# Patient Record
Sex: Male | Born: 1940 | Race: White | Hispanic: No | Marital: Married | Smoking: Former smoker
Health system: Southern US, Community
[De-identification: ages and names within clinical notes are randomized; demographics above are authoritative.]

## PROBLEM LIST (undated history)

## (undated) DIAGNOSIS — E785 Hyperlipidemia, unspecified: Secondary | ICD-10-CM

## (undated) DIAGNOSIS — Z8679 Personal history of other diseases of the circulatory system: Secondary | ICD-10-CM

## (undated) DIAGNOSIS — I5020 Unspecified systolic (congestive) heart failure: Secondary | ICD-10-CM

## (undated) DIAGNOSIS — I2510 Atherosclerotic heart disease of native coronary artery without angina pectoris: Secondary | ICD-10-CM

## (undated) DIAGNOSIS — I10 Essential (primary) hypertension: Secondary | ICD-10-CM

## (undated) DIAGNOSIS — J439 Emphysema, unspecified: Secondary | ICD-10-CM

## (undated) DIAGNOSIS — C801 Malignant (primary) neoplasm, unspecified: Secondary | ICD-10-CM

## (undated) HISTORY — PX: TONSILLECTOMY: SUR1361

## (undated) HISTORY — PX: KNEE ARTHROSCOPY: SHX127

## (undated) HISTORY — PX: ORIF FEMORAL NECK FRACTURE W/ DHS: SUR930

## (undated) HISTORY — DX: Hyperlipidemia, unspecified: E78.5

## (undated) HISTORY — DX: Emphysema, unspecified: J43.9

## (undated) HISTORY — DX: Personal history of other diseases of the circulatory system: Z86.79

## (undated) HISTORY — DX: Atherosclerotic heart disease of native coronary artery without angina pectoris: I25.10

## (undated) HISTORY — PX: CORONARY STENT PLACEMENT: SHX1402

---

## 1998-10-06 ENCOUNTER — Ambulatory Visit (HOSPITAL_COMMUNITY): Admission: RE | Admit: 1998-10-06 | Discharge: 1998-10-06 | Payer: Self-pay | Admitting: Internal Medicine

## 2000-03-28 ENCOUNTER — Encounter: Admission: RE | Admit: 2000-03-28 | Discharge: 2000-03-28 | Payer: Self-pay | Admitting: Internal Medicine

## 2000-03-28 ENCOUNTER — Encounter: Payer: Self-pay | Admitting: Internal Medicine

## 2000-04-02 ENCOUNTER — Ambulatory Visit (HOSPITAL_COMMUNITY): Admission: RE | Admit: 2000-04-02 | Discharge: 2000-04-02 | Payer: Self-pay | Admitting: Internal Medicine

## 2000-04-02 ENCOUNTER — Encounter: Payer: Self-pay | Admitting: Internal Medicine

## 2001-01-23 ENCOUNTER — Other Ambulatory Visit: Admission: RE | Admit: 2001-01-23 | Discharge: 2001-01-23 | Payer: Self-pay | Admitting: Internal Medicine

## 2001-03-21 ENCOUNTER — Encounter: Admission: RE | Admit: 2001-03-21 | Discharge: 2001-03-21 | Payer: Self-pay | Admitting: Orthopedic Surgery

## 2001-03-21 ENCOUNTER — Encounter: Payer: Self-pay | Admitting: Orthopedic Surgery

## 2004-02-09 ENCOUNTER — Emergency Department (HOSPITAL_COMMUNITY): Admission: EM | Admit: 2004-02-09 | Discharge: 2004-02-09 | Payer: Self-pay | Admitting: Family Medicine

## 2006-07-14 ENCOUNTER — Emergency Department (HOSPITAL_COMMUNITY): Admission: EM | Admit: 2006-07-14 | Discharge: 2006-07-14 | Payer: Self-pay | Admitting: Family Medicine

## 2006-07-21 ENCOUNTER — Emergency Department (HOSPITAL_COMMUNITY): Admission: EM | Admit: 2006-07-21 | Discharge: 2006-07-21 | Payer: Self-pay | Admitting: Emergency Medicine

## 2006-08-29 ENCOUNTER — Ambulatory Visit (HOSPITAL_COMMUNITY): Admission: RE | Admit: 2006-08-29 | Discharge: 2006-08-29 | Payer: Self-pay | Admitting: Gastroenterology

## 2008-07-31 ENCOUNTER — Emergency Department (HOSPITAL_COMMUNITY): Admission: EM | Admit: 2008-07-31 | Discharge: 2008-07-31 | Payer: Self-pay | Admitting: Family Medicine

## 2009-06-11 ENCOUNTER — Encounter: Payer: Self-pay | Admitting: Pulmonary Disease

## 2009-06-29 ENCOUNTER — Inpatient Hospital Stay (HOSPITAL_COMMUNITY): Admission: AD | Admit: 2009-06-29 | Discharge: 2009-06-30 | Payer: Self-pay | Admitting: Cardiology

## 2009-07-06 ENCOUNTER — Inpatient Hospital Stay (HOSPITAL_COMMUNITY): Admission: AD | Admit: 2009-07-06 | Discharge: 2009-07-07 | Payer: Self-pay | Admitting: Interventional Cardiology

## 2009-12-03 ENCOUNTER — Emergency Department (HOSPITAL_COMMUNITY): Admission: EM | Admit: 2009-12-03 | Discharge: 2009-12-03 | Payer: Self-pay | Admitting: Family Medicine

## 2010-02-22 ENCOUNTER — Emergency Department (HOSPITAL_COMMUNITY): Admission: EM | Admit: 2010-02-22 | Discharge: 2010-02-22 | Payer: Self-pay | Admitting: Family Medicine

## 2010-03-07 ENCOUNTER — Ambulatory Visit: Payer: Self-pay | Admitting: Family Medicine

## 2010-03-07 DIAGNOSIS — S7000XA Contusion of unspecified hip, initial encounter: Secondary | ICD-10-CM | POA: Insufficient documentation

## 2010-03-07 DIAGNOSIS — F411 Generalized anxiety disorder: Secondary | ICD-10-CM | POA: Insufficient documentation

## 2010-03-07 DIAGNOSIS — E785 Hyperlipidemia, unspecified: Secondary | ICD-10-CM

## 2010-03-07 DIAGNOSIS — I10 Essential (primary) hypertension: Secondary | ICD-10-CM | POA: Insufficient documentation

## 2010-04-05 ENCOUNTER — Encounter: Payer: Self-pay | Admitting: Pulmonary Disease

## 2010-04-05 ENCOUNTER — Ambulatory Visit (HOSPITAL_COMMUNITY): Admission: RE | Admit: 2010-04-05 | Discharge: 2010-04-05 | Payer: Self-pay | Admitting: Cardiology

## 2010-04-16 ENCOUNTER — Encounter (HOSPITAL_COMMUNITY): Admission: RE | Admit: 2010-04-16 | Discharge: 2010-06-08 | Payer: Self-pay | Admitting: Cardiology

## 2010-08-05 ENCOUNTER — Ambulatory Visit (HOSPITAL_COMMUNITY): Admission: RE | Admit: 2010-08-05 | Discharge: 2010-08-05 | Payer: Self-pay | Admitting: Gastroenterology

## 2010-08-14 ENCOUNTER — Encounter: Payer: Self-pay | Admitting: Pulmonary Disease

## 2010-12-14 ENCOUNTER — Encounter: Payer: Self-pay | Admitting: Pulmonary Disease

## 2010-12-16 ENCOUNTER — Ambulatory Visit (HOSPITAL_COMMUNITY)
Admission: RE | Admit: 2010-12-16 | Discharge: 2010-12-16 | Payer: Self-pay | Source: Home / Self Care | Attending: Internal Medicine | Admitting: Internal Medicine

## 2010-12-16 ENCOUNTER — Encounter: Payer: Self-pay | Admitting: Pulmonary Disease

## 2010-12-22 ENCOUNTER — Encounter (HOSPITAL_COMMUNITY)
Admission: RE | Admit: 2010-12-22 | Discharge: 2011-01-04 | Payer: Self-pay | Source: Home / Self Care | Attending: Family Medicine | Admitting: Family Medicine

## 2010-12-22 ENCOUNTER — Other Ambulatory Visit: Payer: Self-pay | Admitting: Family Medicine

## 2010-12-23 ENCOUNTER — Ambulatory Visit
Admission: RE | Admit: 2010-12-23 | Discharge: 2010-12-23 | Payer: Self-pay | Source: Home / Self Care | Attending: Pulmonary Disease | Admitting: Pulmonary Disease

## 2010-12-23 DIAGNOSIS — R519 Headache, unspecified: Secondary | ICD-10-CM | POA: Insufficient documentation

## 2010-12-23 DIAGNOSIS — J439 Emphysema, unspecified: Secondary | ICD-10-CM

## 2010-12-23 DIAGNOSIS — R7611 Nonspecific reaction to tuberculin skin test without active tuberculosis: Secondary | ICD-10-CM | POA: Insufficient documentation

## 2010-12-23 DIAGNOSIS — R222 Localized swelling, mass and lump, trunk: Secondary | ICD-10-CM | POA: Insufficient documentation

## 2010-12-23 DIAGNOSIS — F17200 Nicotine dependence, unspecified, uncomplicated: Secondary | ICD-10-CM | POA: Insufficient documentation

## 2010-12-23 DIAGNOSIS — R51 Headache: Secondary | ICD-10-CM | POA: Insufficient documentation

## 2010-12-23 HISTORY — DX: Emphysema, unspecified: J43.9

## 2010-12-24 ENCOUNTER — Other Ambulatory Visit
Admission: RE | Admit: 2010-12-24 | Discharge: 2010-12-24 | Payer: Self-pay | Source: Home / Self Care | Admitting: Internal Medicine

## 2010-12-27 ENCOUNTER — Other Ambulatory Visit: Payer: Self-pay | Admitting: Pulmonary Disease

## 2010-12-27 ENCOUNTER — Telehealth: Payer: Self-pay | Admitting: Pulmonary Disease

## 2010-12-27 DIAGNOSIS — R918 Other nonspecific abnormal finding of lung field: Secondary | ICD-10-CM

## 2010-12-28 LAB — CULTURE, RESPIRATORY W GRAM STAIN: Culture: NORMAL

## 2011-01-05 ENCOUNTER — Encounter: Payer: Self-pay | Admitting: Pulmonary Disease

## 2011-01-05 ENCOUNTER — Other Ambulatory Visit: Payer: Self-pay | Admitting: Pulmonary Disease

## 2011-01-05 DIAGNOSIS — R51 Headache: Secondary | ICD-10-CM

## 2011-01-06 NOTE — Progress Notes (Signed)
Summary: Proair too expensive  Phone Note Call from Patient   Caller: Todd Villanueva Call For: Todd Villanueva Summary of Call: the inhalers dr Craige Cotta gave to him are very expensive can he use something else [phone] Initial call taken by: Oneita Jolly,  December 27, 2010 10:52 AM  Follow-up for Phone Call        Spoke with pt and  he states that he has insurance with a  high deductible so he is having to pay cash for proair and it si too expensive. he states he will talk to Dr. 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 i advised I could sent message to Dr. Craige Cotta, and he states he will just talk to him at work. [name] CMA  December 27, 2010 2:45 PM

## 2011-01-06 NOTE — Assessment & Plan Note (Signed)
Summary: R Hip pain x 2 dys rm 3   Vital Signs:  Patient Profile:   70 Years Old Male CC:      R Hip pain x 2 dys Height:     67 inches Weight:      174 pounds O2 Sat:      98 % O2 treatment:    Room Air Temp:     97.5 degrees F oral Pulse rate:   71 / minute Pulse rhythm:   regular Resp:     16 per minute BP sitting:   151 / 75  (right arm) Cuff size:   regular  Vitals Entered By: Areta Haber CMA (March 07, 2010 2:20 PM)                  Current Allergies (reviewed today): ! VICODIN      History of Present Illness Chief Complaint: R Hip pain x 2 dys History of Present Illness: Subjective:  Patient complains of falling on his right hip 2 days ago, and has had persistent pain in that area with ambulation.  He states that he had a subtle fracture in the right hip about 9 years ago that was not obvious on the initial x-ray.  Current Problems: CONTUSION, HIP, RIGHT (ICD-924.01) HYPERTENSION (ICD-401.9) HYPERLIPIDEMIA (ICD-272.4) ANXIETY (ICD-300.00)   Current Meds SIMVASTATIN 20 MG TABS (SIMVASTATIN) 1 tab by mouth once daily PROTONIX 40 MG TBEC (PANTOPRAZOLE SODIUM) 2 tabs by mouth once daily PLAVIX 75 MG TABS (CLOPIDOGREL BISULFATE) 1 tab by mouth once daily LISINOPRIL 40 MG TABS (LISINOPRIL) 1 tab by mouth once daily CITALOPRAM HYDROBROMIDE 40 MG TABS (CITALOPRAM HYDROBROMIDE) 1/2 tab by mouth once daily IBUPROFEN 800 MG TABS (IBUPROFEN) as needed. Wife's medication IBUPROFEN 800 MG TABS (IBUPROFEN) One by mouth every 8 hours with food  REVIEW OF SYSTEMS Constitutional Symptoms      Denies fever, chills, night sweats, weight loss, weight gain, and fatigue.  Eyes       Denies change in vision, eye pain, eye discharge, glasses, contact lenses, and eye surgery. Ear/Nose/Throat/Mouth       Denies hearing loss/aids, change in hearing, ear pain, ear discharge, dizziness, frequent runny nose, frequent nose bleeds, sinus problems, sore throat, hoarseness, and  tooth pain or bleeding.  Respiratory       Denies dry cough, productive cough, wheezing, shortness of breath, asthma, bronchitis, and emphysema/COPD.  Cardiovascular       Denies murmurs, chest pain, and tires easily with exhertion.    Gastrointestinal       Denies stomach pain, nausea/vomiting, diarrhea, constipation, blood in bowel movements, and indigestion. Genitourniary       Denies painful urination, kidney stones, and loss of urinary control. Neurological       Denies paralysis, seizures, and fainting/blackouts. Musculoskeletal       Complains of decreased range of motion.      Denies muscle pain, joint pain, joint stiffness, redness, swelling, muscle weakness, and gout.      Comments: R Hip pain x 2 dys Skin       Denies bruising, unusual mles/lumps or sores, and hair/skin or nail changes.  Psych       Denies mood changes, temper/anger issues, anxiety/stress, speech problems, depression, and sleep problems. Other Comments: Pt states that his dogs's chain got wrapped around his leg and he fell on right side. Pt has not seen PCP for this.   Past History:  Past Medical History: Anxiety Hyperlipidemia Hypertension Acid Reflux  Past Surgical History: Heart Stents  Family History: Family History Other cancer  Social History: Married Current Smoker - 1/2 pack daily Alcohol use-yes 2 drinks weekly Drug use-no Regular exercise-yes Smoking Status:  current Drug Use:  no Does Patient Exercise:  yes   Objective:  No acute distress.  Patient walking with a cane Lungs:  Clear to auscultation.  Breath sounds are equal.  Heart:  Regular rate and rhythm without murmurs, rubs, or gallops.  Abdomen:  Nontender without masses or hepatosplenomegaly.  Bowel sounds are present.  No CVA or flank tenderness.  Right hip:  Good range of motion but has pain with external rotation.  Tenderness superior to the greater trochanter. X-ray right hip negative Assessment New  Problems: CONTUSION, HIP, RIGHT (ICD-924.01) HYPERTENSION (ICD-401.9) HYPERLIPIDEMIA (ICD-272.4) ANXIETY (ICD-300.00)   Plan New Medications/Changes: IBUPROFEN 800 MG TABS (IBUPROFEN) One by mouth every 8 hours with food  #30 x 0, 03/07/2010, Donna Christen MD  New Orders: T-DG Hip Complete*R* [16109] New Patient Level III [60454] Planning Comments:   Apply ice pack for 2 to 3 days.  Use crutches for about 5 days.  May take wife's 800mg  Ibuprofen.  Begin hip exercises ([facility] information and instruction patient handout given)  Follow-up with orthopedist if not improving 2 weeks.   The patient and/or caregiver has been counseled thoroughly with regard to medications prescribed including dosage, schedule, interactions, rationale for use, and possible side effects and they verbalize understanding.  Diagnoses and expected course of recovery discussed and will return if not improved as expected or if the condition worsens. Patient and/or caregiver verbalized understanding.  Prescriptions: IBUPROFEN 800 MG TABS (IBUPROFEN) One by mouth every 8 hours with food  #30 x 0   Entered and Authorized by:   Donna Christen MD   Signed by:   Donna Christen MD on 03/07/2010   Method used:   Print then Give to Patient   RxID:   [id number]

## 2011-01-06 NOTE — Assessment & Plan Note (Signed)
Summary: ABNORMAL CHEST CT POSTIVIE PPD//SH   Primary Provider/Referring Provider:  Marden Noble  CC:  Pulmonary Consult, abnormal CT , positive ppd 1990, pt c/o headaches, and sob mild with exertion.  History of Present Illness: 70 yo male with hx of positive PPD in 1990, tobacco abuse, emphysema, chronic cough, and right lung mass.  He has chronic cough for the past 3 months.  He was seen by primary care on Dec 14, 2010.  He had chest xray then which showed right lower lung lesion.  He was given zithromax, and had CT chest ordered.  After CT chest was done pulmonary consultation was requested.  He had positive PPD in 1990.  He was never treated for active TB.  His cough has been dry.  He denies fever, sweats, or hemoptysis.  His weight has been steady, and he denies chest pain.  He does get wheezing, and will occasionally get short of breath.  He was told he has asthmatic bronchitis when he gets a cold.  He was recently given prednisone for his cough, and this seemed to help.  He has not been on inhalers for his breathing before.  He does not recall having breathing tests before.    He has smoked upto 1 pack of cigarettes per day.  He has reduced to 1/2 pack per day.  He denies any recent sick exposures.  He has a Emergency planning/management officer and dog, but no other animal exposures.  He has never had pneumonia before.  He works as the Production assistant, radio at [employer].  He denies any travel history.  He has been getting a constant frontal headache for the past 3 weeks.  He did not have problems with headache before.  He denies change in vision, difficulty swallowing, or hoarseness.  He has not noticed gland swelling, leg swelling, or skin rashes.  He had sputum samples for AFB and cytology sent which are negative to date.  Chest xray report from June 10, 2009: mild peribronchial thickening and mild hyperinflation of the lungs. Labs from December 14, 2010 were reviewed, and unremarkable.  CT of Chest  Procedure  date:  12/16/2010  Findings:      CT CHEST WITH CONTRAST    Technique:  Multidetector CT imaging of the chest was performed   following the standard protocol during bolus administration of   intravenous contrast.    Contrast: 100  ml Omnipaque-300    Comparison: None.  No outside report or plain film is available for   comparison.    Findings: Lung windows demonstrate moderate centrilobular   emphysema.  Concurrent paraseptal component.  7 mm left lower lobe lung nodule on image 35 may represent a subpleural lymph node.    A partially cavitary right lower lobe lung mass measures 5.6 x 4.8   cm on transverse image 42 and 5.6 cm on sagittal reformatted image 21.  There is mild surrounding interstitial opacity.    Soft tissue windows demonstrate moderate great vessel   atherosclerosis.  Borderline cardiomegaly with coronary artery   atherosclerosis.  No pericardial or pleural effusion.  The right   lower lobe mass contacts the right pleural surface but there is no   chest wall invasion or rib destruction.    9 mm high and low right paratracheal lymph nodes are indeterminate.    Extensive right infrahilar adenopathy.  Index nodal mass measures   2.5 x 1.8 cm on image 31 and displaces the patent pulmonary artery branch to  the right lower lobe.  Azygo-esophageal recess node measures 8 mm.    Limited abdominal imaging demonstrates normal adrenal glands.   Upper pole right renal cyst.  Mild nonspecific perirenal edema is   symmetric.No acute osseous abnormality.  Comments:       IMPRESSION:    1.  Partially cavitary right lower lobe lung mass.  Highly   suspicious for a primary bronchogenic carcinoma.  Given the   surrounding interstitial opacity, infection is possible but felt   less likely.  Consider thoracic surgery consultation.  If there are   acute infectious symptoms, antibiotics could be given and follow-up CT performed in approximately 2 weeks.   2.  Right hilar/infrahilar  adenopathy, presumably related to nodal   metastasis.  More equivocal right-sided mediastinal lymph nodes.   3.  Coronary artery atherosclerosis.   Preventive Screening-Counseling & Management  Alcohol-Tobacco     Smoking Status: current     Packs/Day: 0.5  Current Medications (verified): 1)  Simvastatin 40 Mg Tabs (Simvastatin) .Marland Kitchen.. 1 Three Times A Week , M ,w, F 2)  Lisinopril 40 Mg Tabs (Lisinopril) .Marland Kitchen.. 1 Tab By Mouth Once Daily 3)  Citalopram Hydrobromide 40 Mg Tabs (Citalopram Hydrobromide) .... 1/2 Tab By Mouth Once Daily 4)  Omeprazole 40 Mg Cpdr (Omeprazole) .Marland Kitchen.. 1 Once Daily 5)  Aspirin 81 Mg Tabs (Aspirin) .Marland Kitchen.. 1 Once Daily  Allergies (verified): 1)  ! 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 D deficiency B12 deficiency Iron deficiency anemia  Past Surgical History: Coronary stenting July, August 2011 Left knee arthroscopy Tonsillectomy Right femoral neck fracture Right 9th rib fracture Colonoscopy 2011  Family History: Reviewed history from 03/07/2010 and no changes required. Father - Multiple myeloma Brother - Multiple myeloma Sister - emphysema  Social History: Reviewed history from 03/07/2010 and no changes required. Married, has children.  Smokes 1/2 pack per day.  Occasional alcohol use.  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  Packs/Day:  0.5  Review of Systems       The patient complains of shortness of breath with activity, productive cough, and headaches.  The patient denies shortness of breath at rest, non-productive cough, coughing up blood, chest pain, irregular heartbeats, acid heartburn, indigestion, loss of appetite, weight change, abdominal pain, difficulty swallowing, sore throat, tooth/dental problems, nasal congestion/difficulty  breathing through nose, sneezing, itching, ear ache, anxiety, depression, hand/feet swelling, joint stiffness or pain, rash, change in color of mucus, and fever.    Vital Signs:  Patient profile:   70 year old male Height:      67 inches Weight:      168 pounds BMI:     26.41 O2 Sat:      99 % on Room air Temp:     97.3 degrees F oral Pulse rate:   71 / minute BP sitting:   140 / 80  (left arm)  Vitals Entered By: Renold Genta RCP, LPN (December 23, 2010 3:19 PM)  O2 Flow:  Room air CC: Pulmonary Consult, abnormal CT , positive ppd 1990, pt c/o headaches, sob mild with exertion Comments Medications reviewed with patient Renold Genta RCP, LPN  December 23, 2010 3:24 PM    Physical Exam  General:  normal appearance and healthy appearing.   Eyes:  PERRLA and EOMI, wears glasses Nose:  no deformity, discharge, inflammation, or lesions Mouth:  wears dentures, no exudate Neck:  no JVD.   Chest Wall:  no deformities noted Lungs:  coarse breath sounds, no wheezing or rales Heart:  regular rate and rhythm, S1, S2 without murmurs, rubs, gallops, or clicks Abdomen:  bowel sounds positive; abdomen soft and non-tender without masses, or organomegaly Msk:  no deformity or scoliosis noted with normal posture Pulses:  pulses normal Extremities:  no clubbing, cyanosis, edema, or deformity noted Neurologic:  normal CN II-XII and strength normal.   Skin:  intact without lesions or rashes Cervical Nodes:  no significant adenopathy Psych:  anxious.     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  His radiographic appearance is concerning for malignancy.    He has a prior history of positive PPD.  However, his symptoms are not suggestive of active TB.  In addition he has negative AFB smears.  My clinical suspicion for TB is very low.  I have recommended further evaluation for malignancy with PET scan imaging of his chest.   He will then likely need biopsy of his lesion, and best location will be determined after review of his PET scan.  Problem # 2:  HEADACHE (ICD-784.0) He has recent onset of headache.  Given concern for lung malignancy, will order MRI of brain with contrast to further assess for metastatic brain lesions.  Problem # 3:  EMPHYSEMA (ICD-492.8) He has extensive history of tobacco abuse, and radiographic findings of emphysema.  His symptoms are suggestive of chronic bronchitis/obstructive lung disease.  Will give him a therapuetic trial of proair.  Depending on his response he may need further assessment with pulmonary function testing, especially if he needs to have surgical evaluation for his right lung lesion.  Problem # 4:  TOBACCO ABUSE (ICD-305.1) Explained to him how continued tobacco abuse is affecting his health.    Problem # 5:  NONSPEC REACT TUBERCULIN SKIN TEST W/O ACTIVE TB (ICD-795.51) As stated above clinical suspicion for active TB as cause of his radiographic findings is low.  Medications Added to Medication List This Visit: 1)  Proair Hfa 108 (90 Base) Mcg/act Aers (Albuterol sulfate) .... Two puffs up to four times per day as needed 2)  Simvastatin 40 Mg Tabs (Simvastatin) .Marland Kitchen.. 1 three times a week , m ,w, f 3)  Aspirin 81 Mg Tabs (Aspirin) .Marland Kitchen.. 1 once daily 4)  Omeprazole 40 Mg Cpdr (Omeprazole) .Marland Kitchen.. 1 once daily  Complete Medication List: 1)  Proair Hfa 108 (90 Base) Mcg/act Aers (Albuterol sulfate) .... Two puffs up to four times per day as needed 2)  Simvastatin 40 Mg Tabs (Simvastatin) .Marland Kitchen.. 1 three times a week , m ,w, f 3)  Lisinopril 40 Mg Tabs (Lisinopril) .Marland Kitchen.. 1 tab by mouth once daily 4)  Aspirin 81 Mg Tabs (Aspirin) .Marland Kitchen.. 1 once daily 5)  Citalopram Hydrobromide 40 Mg Tabs (Citalopram hydrobromide) .... 1/2 tab by mouth once daily 6)  Omeprazole 40 Mg Cpdr (Omeprazole) .Marland Kitchen.. 1 once daily  Other Orders: Consultation Level IV (16109) Radiology Referral  (Radiology) Radiology Referral (Radiology)  Patient Instructions: 1)  Will schedule PET scan and MRI 2)  Proair two puffs up to four times per day as needed for cough, wheeze, chest congestion, or shortness of breath 3)  Follow up in 2 to 3 weeks Prescriptions: PROAIR HFA 108 (90 BASE) MCG/ACT AERS (ALBUTEROL SULFATE) two puffs up to four times  per day as needed  #1 x 3   Entered and Authorized by:   Coralyn Helling MD   Signed by:   Coralyn Helling MD on 12/23/2010   Method used:   Electronically to        [facility] [phone]* (retail)       [address] [place], [place]  [address]       Ph: [phone] or [phone]       Fax: [phone]   RxID:   [id number]

## 2011-01-12 ENCOUNTER — Ambulatory Visit (HOSPITAL_COMMUNITY)
Admission: RE | Admit: 2011-01-12 | Discharge: 2011-01-12 | Disposition: A | Discharge status: Home / Self Care | Payer: 59 | Source: Ambulatory Visit | Attending: Pulmonary Disease | Admitting: Pulmonary Disease

## 2011-01-12 ENCOUNTER — Encounter (HOSPITAL_COMMUNITY): Payer: Self-pay

## 2011-01-12 DIAGNOSIS — C3490 Malignant neoplasm of unspecified part of unspecified bronchus or lung: Secondary | ICD-10-CM | POA: Insufficient documentation

## 2011-01-12 DIAGNOSIS — R918 Other nonspecific abnormal finding of lung field: Secondary | ICD-10-CM

## 2011-01-12 DIAGNOSIS — C771 Secondary and unspecified malignant neoplasm of intrathoracic lymph nodes: Secondary | ICD-10-CM | POA: Insufficient documentation

## 2011-01-12 HISTORY — DX: Essential (primary) hypertension: I10

## 2011-01-12 MED ORDER — FLUDEOXYGLUCOSE F - 18 (FDG) INJECTION: 18.3000 | Freq: Once | INTRAVENOUS | Status: AC | PRN

## 2011-01-13 ENCOUNTER — Ambulatory Visit (HOSPITAL_COMMUNITY)
Admission: RE | Admit: 2011-01-13 | Discharge: 2011-01-13 | Disposition: A | Discharge status: Home / Self Care | Payer: 59 | Source: Ambulatory Visit | Attending: Pulmonary Disease | Admitting: Pulmonary Disease

## 2011-01-13 ENCOUNTER — Telehealth (INDEPENDENT_AMBULATORY_CARE_PROVIDER_SITE_OTHER): Payer: Self-pay | Admitting: *Deleted

## 2011-01-13 DIAGNOSIS — G319 Degenerative disease of nervous system, unspecified: Secondary | ICD-10-CM | POA: Insufficient documentation

## 2011-01-13 DIAGNOSIS — I679 Cerebrovascular disease, unspecified: Secondary | ICD-10-CM | POA: Insufficient documentation

## 2011-01-13 DIAGNOSIS — G9389 Other specified disorders of brain: Secondary | ICD-10-CM | POA: Insufficient documentation

## 2011-01-13 DIAGNOSIS — R51 Headache: Secondary | ICD-10-CM | POA: Insufficient documentation

## 2011-01-13 DIAGNOSIS — J3489 Other specified disorders of nose and nasal sinuses: Secondary | ICD-10-CM | POA: Insufficient documentation

## 2011-01-13 LAB — CREATININE, SERUM: Creatinine, Ser: 1.01 mg/dL (ref 0.4–1.5)

## 2011-01-13 MED ORDER — GADOBENATE DIMEGLUMINE 529 MG/ML IV SOLN
15.0000 mL | Freq: Once | INTRAVENOUS | Status: AC
  Administered 2011-01-13: 15 mL via INTRAVENOUS

## 2011-01-14 ENCOUNTER — Other Ambulatory Visit: Payer: Self-pay | Admitting: Pulmonary Disease

## 2011-01-14 ENCOUNTER — Telehealth: Payer: Self-pay | Admitting: Pulmonary Disease

## 2011-01-14 ENCOUNTER — Encounter: Payer: Self-pay | Admitting: Pulmonary Disease

## 2011-01-14 ENCOUNTER — Other Ambulatory Visit (HOSPITAL_COMMUNITY): Payer: Commercial Managed Care - PPO

## 2011-01-14 DIAGNOSIS — R918 Other nonspecific abnormal finding of lung field: Secondary | ICD-10-CM

## 2011-01-19 ENCOUNTER — Ambulatory Visit (HOSPITAL_COMMUNITY)
Admission: RE | Admit: 2011-01-19 | Discharge: 2011-01-19 | Disposition: A | Discharge status: Home / Self Care | Payer: 59 | Source: Ambulatory Visit | Attending: Interventional Radiology | Admitting: Interventional Radiology

## 2011-01-19 ENCOUNTER — Other Ambulatory Visit: Payer: Self-pay | Admitting: Pulmonary Disease

## 2011-01-19 ENCOUNTER — Encounter (HOSPITAL_COMMUNITY)
Admission: RE | Admit: 2011-01-19 | Discharge: 2011-01-19 | Disposition: A | Discharge status: Home / Self Care | Payer: 59 | Source: Ambulatory Visit | Attending: Pulmonary Disease | Admitting: Pulmonary Disease

## 2011-01-19 ENCOUNTER — Other Ambulatory Visit (HOSPITAL_COMMUNITY): Payer: Self-pay | Admitting: Interventional Radiology

## 2011-01-19 ENCOUNTER — Other Ambulatory Visit: Payer: Self-pay | Admitting: Interventional Radiology

## 2011-01-19 DIAGNOSIS — Z87898 Personal history of other specified conditions: Secondary | ICD-10-CM

## 2011-01-19 DIAGNOSIS — J95811 Postprocedural pneumothorax: Secondary | ICD-10-CM

## 2011-01-19 DIAGNOSIS — R918 Other nonspecific abnormal finding of lung field: Secondary | ICD-10-CM

## 2011-01-19 DIAGNOSIS — C3430 Malignant neoplasm of lower lobe, unspecified bronchus or lung: Secondary | ICD-10-CM | POA: Insufficient documentation

## 2011-01-19 DIAGNOSIS — Z01818 Encounter for other preprocedural examination: Secondary | ICD-10-CM | POA: Insufficient documentation

## 2011-01-19 DIAGNOSIS — Z01812 Encounter for preprocedural laboratory examination: Secondary | ICD-10-CM | POA: Insufficient documentation

## 2011-01-19 LAB — CBC
HCT: 30.1 % — ABNORMAL LOW (ref 39.0–52.0)
Hemoglobin: 9.7 g/dL — ABNORMAL LOW (ref 13.0–17.0)
MCH: 28.8 pg (ref 26.0–34.0)
MCHC: 32.2 g/dL (ref 30.0–36.0)
MCV: 89.3 fL (ref 78.0–100.0)

## 2011-01-20 ENCOUNTER — Telehealth (INDEPENDENT_AMBULATORY_CARE_PROVIDER_SITE_OTHER): Payer: Self-pay | Admitting: *Deleted

## 2011-01-20 NOTE — Progress Notes (Signed)
Summary: corrected order  Phone Note Other Incoming Call back at [phone]   Caller: [facility]//radiology Summary of Call: Need to get a corrected order on this pt faxed to [phone]. Initial call taken by: Darletta Moll,  January 13, 2011 10:56 AM  Follow-up for Phone Call        Done- order for MRI brain faxed and also order for stat bmet was faxed to [facility] at the number requested.  Follow-up by: Vernie Murders,  January 13, 2011 11:04 AM

## 2011-01-20 NOTE — Progress Notes (Signed)
  Phone Note Outgoing Call   Call placed by: Coralyn Helling MD,  January 14, 2011 9:20 AM Call placed to: Patient Summary of Call: Spoke with patient about planned biopsy tests with EBUS and ENB.  Explained that doing these procedures would give highest likelihood of dx for primary lesion and possible rt hilar nodal involvement.  Explained that simple bronchoscopy can be done, but would be lower yield.  He does not want to go through any of this.  He only wants to find out if he has cancer, and then will decide what additional tests he would want to do.  He has therefore deferred bronchoscopy with EBUS and ENB at this time. I will therefore arrange for needle biopsy of rt lower lung lesion by interventional radiology.  Depending on results of this, I have explained that he may need to reconsider EBUS and ENB. Initial call taken by: Coralyn Helling MD,  January 14, 2011 9:21 AM

## 2011-01-20 NOTE — Letter (Signed)
Summary: Barbette Or MD/[facility] Ma[facility]en Chatters MD/[facility]   Imported By: Lester Warba 01/13/2011 08:14:25  _____________________________________________________________________  External Attachment:    Type:   Image     Comment:   External Document

## 2011-01-20 NOTE — Miscellaneous (Signed)
Summary: Spirometry   Pulmonary Function Test Date: 04/08/2010 Height (in.): 65 Gender: Male  Pre-Spirometry FVC    Value: 3.41 L/min   Pred: 3.74 L/min     % Pred: 91 % FEV1    Value: 2.48 L     Pred: 2.77 L     % Pred: 89 % FEV1/FVC  Value: 73 %     Pred: 74 %     % Pred: 98 % FEF 25-75  Value: 1.63 L/min   Pred: 2.20 L/min     % Pred: 74 %  Post-Spirometry FVC    Value: 3.69 L/min   Pred: 3.74 L/min     % Pred: 98 % FEV1    Value: 2.58 L     Pred: 2.77 L     % Pred: 93 % FEV1/FVC  Value: 70 %     Pred: 74 %     % Pred: 90 % FEF 25-75  Value: 2.06 L/min   Pred: 2.20 L/min     % Pred: 93 % Clinical Lists Changes  Observations: Added new observation of FEF2575%EXPS: 93 % (04/05/2010 10:37) Added new observation of PSTFEF25/75P: 2.20  (04/05/2010 10:37) Added new observation of PSTFEF25/75%: 2.06 L/min (04/05/2010 10:37) Added new observation of PSTFEV1/FCV%: 90 % (04/05/2010 10:37) Added new observation of FEV1FVCPRDPS: 74 % (04/05/2010 10:37) Added new observation of PSTFEV1/FVC: 70 % (04/05/2010 10:37) Added new observation of POSTFEV1%PRD: 93 % (04/05/2010 10:37) Added new observation of FEV1PRDPST: 2.77 L (04/05/2010 10:37) Added new observation of POST FEV1: 2.58 L/min (04/05/2010 10:37) Added new observation of POST FVC%EXP: 98 % (04/05/2010 10:37) Added new observation of FVCPRDPST: 3.74 L/min (04/05/2010 10:37) Added new observation of POST FVC: 3.69 L (04/05/2010 10:37) Added new observation of FEF % EXPEC: 74 % (04/05/2010 10:37) Added new observation of FEF25-75%PRE: 2.20 L/min (04/05/2010 10:37) Added new observation of FEF 25-75%: 1.63 L/min (04/05/2010 10:37) Added new observation of FEV1/FVC%EXP: 98 % (04/05/2010 10:37) Added new observation of FEV1/FVC PRE: 74 % (04/05/2010 10:37) Added new observation of FEV1/FVC: 73 % (04/05/2010 10:37) Added new observation of FEV1 % EXP: 89 % (04/05/2010 10:37) Added new observation of FEV1 PREDICT: 2.77 L (04/05/2010  10:37) Added new observation of FEV1: 2.48 L (04/05/2010 10:37) Added new observation of FVC % EXPECT: 91 % (04/05/2010 10:37) Added new observation of FVC PREDICT: 3.74 L (04/05/2010 10:37) Added new observation of FVC: 3.41 L (04/05/2010 10:37) Added new observation of PFT HEIGHT: 65  (04/05/2010 10:37) Added new observation of PFT DATE: 04/08/2010  (04/05/2010 10:37)

## 2011-01-20 NOTE — Letter (Signed)
Summary: [facility]ette Or MD[facility] Ma[facility]en Chatters [facility]   Imported By: Lester Sonora 01/13/2011 08:16:44  _____________________________________________________________________  External Attachment:    Type:   Image     Comment:   External Document

## 2011-01-20 NOTE — Progress Notes (Signed)
Summary: breathing test  ---- Converted from flag ---- ---- 01/13/2011 4:44 PM, Coralyn Helling MD wrote: That should be fine for now.  Please have them send this.  ---- 01/13/2011 4:34 PM, Carver Fila wrote: called and spoke with Dr. Kevan Ny nurse and she states they did not do a pft on the patient and it is not showing in his chart where one was even ordered. She says he had a spirometry back in may of 2011.  ---- 01/13/2011 4:27 PM, Coralyn Helling MD wrote: Can you call Dr. Marden Noble office to have them fax over any PFT's they have.  Pt states he had PFT one or two months ago.  Thanks. ------------------------------  Phone Note Outgoing Call   Call placed by: Carver Fila,  January 13, 2011 5:03 PM Call placed to: Dr. Kevan Ny office Summary of Call: Called and left a message for Dr. Kevan Ny nurse to fax over last spirometry pt had since he did not have any pft's done.  Carver Fila  January 13, 2011 5:04 PM   received spirometry and was put into VS look at folder [facility]  January 14, 2011 9:56 AM

## 2011-01-21 ENCOUNTER — Ambulatory Visit (INDEPENDENT_AMBULATORY_CARE_PROVIDER_SITE_OTHER): Payer: Commercial Managed Care - PPO | Admitting: Pulmonary Disease

## 2011-01-21 ENCOUNTER — Encounter: Payer: Self-pay | Admitting: Pulmonary Disease

## 2011-01-21 DIAGNOSIS — C3490 Malignant neoplasm of unspecified part of unspecified bronchus or lung: Secondary | ICD-10-CM

## 2011-01-21 DIAGNOSIS — F17200 Nicotine dependence, unspecified, uncomplicated: Secondary | ICD-10-CM

## 2011-01-21 DIAGNOSIS — C3431 Malignant neoplasm of lower lobe, right bronchus or lung: Secondary | ICD-10-CM | POA: Insufficient documentation

## 2011-01-21 DIAGNOSIS — J438 Other emphysema: Secondary | ICD-10-CM

## 2011-01-24 ENCOUNTER — Ambulatory Visit (HOSPITAL_COMMUNITY)
Admission: RE | Admit: 2011-01-24 | Discharge: 2011-01-24 | Disposition: A | Discharge status: Home / Self Care | Payer: 59 | Source: Ambulatory Visit | Attending: Pulmonary Disease | Admitting: Pulmonary Disease

## 2011-01-24 ENCOUNTER — Encounter (HOSPITAL_COMMUNITY): Payer: Commercial Managed Care - PPO

## 2011-01-24 ENCOUNTER — Encounter: Payer: Self-pay | Admitting: Pulmonary Disease

## 2011-01-24 DIAGNOSIS — J449 Chronic obstructive pulmonary disease, unspecified: Secondary | ICD-10-CM | POA: Insufficient documentation

## 2011-01-24 DIAGNOSIS — C3490 Malignant neoplasm of unspecified part of unspecified bronchus or lung: Secondary | ICD-10-CM | POA: Insufficient documentation

## 2011-01-24 DIAGNOSIS — J4489 Other specified chronic obstructive pulmonary disease: Secondary | ICD-10-CM | POA: Insufficient documentation

## 2011-01-25 ENCOUNTER — Ambulatory Visit: Payer: Commercial Managed Care - PPO | Admitting: Pulmonary Disease

## 2011-01-26 NOTE — Progress Notes (Signed)
Summary: Appt sched  ---- Converted from flag ---- ---- 01/20/2011 4:02 PM, Coralyn Helling MD wrote: Please call to schedule for ROV to review biopsy results.  Okay to overbook for 02/17 in afternoon.  thanks. ------------------------------  Phone Note Outgoing Call   Call placed by: Renold Genta RCP, LPN,  January 20, 2011 4:40 PM Summary of Call: Pt has appt on 01/21/11 at 2:30  with Dr. Craige Cotta to discuss results

## 2011-01-31 ENCOUNTER — Encounter: Payer: Self-pay | Admitting: Pulmonary Disease

## 2011-02-01 ENCOUNTER — Telehealth (INDEPENDENT_AMBULATORY_CARE_PROVIDER_SITE_OTHER): Payer: Self-pay | Admitting: *Deleted

## 2011-02-01 NOTE — Assessment & Plan Note (Signed)
Summary: f/u for results- ok to dbl book per vs//kp   Primary Provider/Referring Provider:  Marden Noble  CC:  Follow up to review  bronchoscopy results  and current smoker 1/2ppd.  History of Present Illness:  70 yo male with hx of positive PPD in 1990, tobacco abuse, emphysema, chronic cough, and right lung mass.  Since his last visit [employer] smears x three have been negative.  He had PET scan with results detailed below.  Plan was to proceed with bronchoscopy, EBUS and ENB to further assess the Rt lower lung lesion and mediastinal adenopathy.  He did not want to undergo any additional imaging tests in preparation for these procedures, and instead requested to proceed with the quickest/safest method to get a diagnosis.  As a result he had had CT guided fine needle biopsy of his Rt lower lung lesion by interventional radiology from Feb. 15, 2012.  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  He also had MRI brain to evaluate his headaches, and results are detailed below.  MRI Brain  Procedure date:  01/13/2011  Findings:        IMPRESSION:   Nonspecific anterior right temporal lobe lesion may represent   result of benign process as discussed above without other findings   to suggest intracranial metastatic disease.  Stability of this   finding can be confirmed on follow-up.    No acute infarct.  CT of Chest  Procedure date:  01/12/2011  Findings:      PET SCAN  Findings: Large mass in the posterior right lower lobe showing   central necrosis on CT shows intense hypermetabolic activity, with   maximum SUV measuring 20.0.  This mass measures 6.8 cm in maximum   diameter, and abuts the right posterolateral chest wall although   there are no definite signs of chest wall invasion.    Hypermetabolic lymphadenopathy is seen in the right hilum which has   a maximum SUV of 8.9.  No hypermetabolic activity is seen within   shotty mediastinal lymph nodes.   There is no hypermetabolic   adenopathy in the left hilum or supraclavicular regions.    No hypermetabolic lymphadenopathy are soft tissue masses are   identified within the neck, abdomen, or pelvis.  Comments:      IMPRESSION:    1.  Large hypermetabolic, partially necrotic mass in the posterior   right lower lobe, consistent with bronchogenic carcinoma.   2.  Metastatic lymphadenopathy in the right hilum.   3.  No other sites of metastatic disease identified.   Preventive Screening-Counseling & Management  Alcohol-Tobacco     Smoking Status: current     Packs/Day: 0.5     Year Started: 1957  Current Medications (verified): 1)  Simvastatin 40 Mg Tabs (Simvastatin) .Marland Kitchen.. 1 Three Times A Week , M ,w, F 2)  Lisinopril 40 Mg Tabs (Lisinopril) .Marland Kitchen.. 1 Tab By Mouth Once Daily 3)  Aspirin 81 Mg Tabs (Aspirin) .Marland Kitchen.. 1 Once Daily 4)  Citalopram Hydrobromide 40 Mg Tabs (Citalopram Hydrobromide) .... 1/2 Tab By Mouth Once Daily 5)  Omeprazole 40 Mg Cpdr (Omeprazole) .Marland Kitchen.. 1 Once Daily  Allergies (verified): 1)  ! 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 D deficiency B12 deficiency  Iron deficiency anemia  Past Surgical History: Reviewed history from 12/23/2010 and no changes required. Coronary stenting July, August 2011 Left knee arthroscopy Tonsillectomy Right femoral neck fracture Right 9th rib fracture Colonoscopy 2011  Family History: Reviewed history from 12/23/2010 and no changes required. Father - Multiple myeloma Brother - Multiple myeloma Sister - emphysema  Social History: Reviewed history from 12/23/2010 and no changes required. Married, has children.  Smokes 1/2 pack per day.  Occasional  alcohol use.  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  Vital Signs:  Patient profile:   70 year old male Height:      67 inches Weight:      169.8 pounds BMI:     26.69 O2 Sat:      98 % on Room air Temp:     97.7 degrees F oral Pulse rate:   86 / minute BP sitting:   138 / 76  (left arm)  Vitals Entered By: Renold Genta RCP, LPN (January 21, 2011 2:25 PM)  O2 Flow:  Room air CC: Follow up to review  bronchoscopy results , current smoker 1/2ppd Comments Medications reviewed with patient Renold Genta RCP, LPN  January 21, 2011 2:25 PM    Physical Exam  General:  normal appearance and healthy appearing.   Nose:  no deformity, discharge, inflammation, or lesions Mouth:  wears dentures, no exudate Neck:  no JVD.   Lungs:  coarse breath sounds, no wheezing or rales Heart:  regular rate and rhythm, S1, S2 without murmurs, rubs, gallops, or clicks Abdomen:  bowel sounds positive; abdomen soft and non-tender without masses, or organomegaly Pulses:  pulses normal Extremities:  no clubbing, cyanosis, edema, or deformity noted Neurologic:  normal CN II-XII and strength normal.   Cervical Nodes:  no significant adenopathy   Impression & Recommendations:  Problem # 1:  CARCINOMA, LUNG, SQUAMOUS CELL (ICD-162.9) Biopsy of Rt lower lung mass shows NSCLC.  He did have abnormal uptake on PET scan involving mediastinal nodes.  Will have case reviewed at multi-disciplinary thoracic oncology conference [facility]) to determine best approach from here.  Problem # 2:  EMPHYSEMA (ICD-492.8) Will arrange for pulmonary function testing to assess for obstructive lung disease, and determine his fitness for surgery if he is deemed a surgical candidate for his NSCLC.  Problem # 3:  TOBACCO ABUSE (ICD-305.1) Again explained the need to stop smoking completely.  Problem # 4:  HEADACHE (ICD-784.0) His recent MRI had a questionable lesion in Rt temporal lobe.  Will need to address at [facility].  Complete  Medication List: 1)  Simvastatin 40 Mg Tabs (Simvastatin) .Marland Kitchen.. 1 three times a week , m ,w, f 2)  Lisinopril 40 Mg Tabs (Lisinopril) .Marland Kitchen.. 1 tab by mouth once daily 3)  Aspirin 81 Mg Tabs (Aspirin) .Marland Kitchen.. 1 once daily 4)  Citalopram Hydrobromide 40 Mg Tabs (Citalopram hydrobromide) .... 1/2 tab by mouth once daily 5)  Omeprazole 40 Mg Cpdr (Omeprazole) .Marland Kitchen.. 1 once daily  Other Orders: Est. Patient Level IV ([address]109) Oncology Referral (Oncology)  Patient Instructions: 1)  Will arrange for breathing test (PFT) 2)  Will arrange for evaluation at Multi-disciplinary thoracic oncology conference ([facility]) 3)  Follow up in 3 to 4 weeks

## 2011-02-02 ENCOUNTER — Other Ambulatory Visit (HOSPITAL_COMMUNITY): Payer: Self-pay

## 2011-02-03 ENCOUNTER — Encounter (INDEPENDENT_AMBULATORY_CARE_PROVIDER_SITE_OTHER): Payer: 59 | Admitting: Internal Medicine

## 2011-02-03 ENCOUNTER — Encounter: Payer: Self-pay | Admitting: Pulmonary Disease

## 2011-02-03 ENCOUNTER — Encounter (INDEPENDENT_AMBULATORY_CARE_PROVIDER_SITE_OTHER): Payer: Commercial Managed Care - PPO

## 2011-02-03 DIAGNOSIS — C3490 Malignant neoplasm of unspecified part of unspecified bronchus or lung: Secondary | ICD-10-CM

## 2011-02-03 LAB — AFB CULTURE WITH SMEAR (NOT AT ARMC)
Acid Fast Smear: NONE SEEN
Acid Fast Smear: NONE SEEN

## 2011-02-08 ENCOUNTER — Other Ambulatory Visit: Payer: Self-pay | Admitting: Internal Medicine

## 2011-02-08 ENCOUNTER — Encounter (HOSPITAL_BASED_OUTPATIENT_CLINIC_OR_DEPARTMENT_OTHER): Payer: Commercial Managed Care - PPO | Admitting: Internal Medicine

## 2011-02-08 DIAGNOSIS — C3490 Malignant neoplasm of unspecified part of unspecified bronchus or lung: Secondary | ICD-10-CM

## 2011-02-08 LAB — COMPREHENSIVE METABOLIC PANEL
ALT: 8 U/L (ref 0–53)
AST: 12 U/L (ref 0–37)
Alkaline Phosphatase: 102 U/L (ref 39–117)
Creatinine, Ser: 1.01 mg/dL (ref 0.40–1.50)
Sodium: 139 mEq/L (ref 135–145)
Total Bilirubin: 0.3 mg/dL (ref 0.3–1.2)
Total Protein: 6.8 g/dL (ref 6.0–8.3)

## 2011-02-08 LAB — CBC WITH DIFFERENTIAL/PLATELET
BASO%: 0.7 % (ref 0.0–2.0)
EOS%: 1 % (ref 0.0–7.0)
HCT: 26.8 % — ABNORMAL LOW (ref 38.4–49.9)
LYMPH%: 19.7 % (ref 14.0–49.0)
MCH: 30.2 pg (ref 27.2–33.4)
MCHC: 33.9 g/dL (ref 32.0–36.0)
MONO%: 8.1 % (ref 0.0–14.0)
NEUT%: 70.5 % (ref 39.0–75.0)
Platelets: 349 10*3/uL (ref 140–400)
RBC: 3 10*6/uL — ABNORMAL LOW (ref 4.20–5.82)
WBC: 8.4 10*3/uL (ref 4.0–10.3)

## 2011-02-08 LAB — AFB CULTURE WITH SMEAR (NOT AT ARMC)

## 2011-02-09 ENCOUNTER — Ambulatory Visit: Payer: 59 | Attending: Radiation Oncology | Admitting: Radiation Oncology

## 2011-02-09 DIAGNOSIS — R109 Unspecified abdominal pain: Secondary | ICD-10-CM | POA: Insufficient documentation

## 2011-02-09 DIAGNOSIS — Z510 Encounter for antineoplastic radiation therapy: Secondary | ICD-10-CM | POA: Insufficient documentation

## 2011-02-09 DIAGNOSIS — C3430 Malignant neoplasm of lower lobe, unspecified bronchus or lung: Secondary | ICD-10-CM | POA: Insufficient documentation

## 2011-02-09 DIAGNOSIS — R079 Chest pain, unspecified: Secondary | ICD-10-CM | POA: Insufficient documentation

## 2011-02-09 DIAGNOSIS — L988 Other specified disorders of the skin and subcutaneous tissue: Secondary | ICD-10-CM | POA: Insufficient documentation

## 2011-02-10 NOTE — Miscellaneous (Signed)
Summary: Pulmonary function test   Pulmonary Function Test Date: 01/24/2011 Height (in.): 65 Gender: Male  Pre-Spirometry FVC    Value: 3.51 L/min   Pred: 3.71 L/min     % Pred: 94 % FEV1    Value: 2.37 L     Pred: 2.74 L     % Pred: 86 % FEV1/FVC  Value: 68 %     Pred: 74 %     % Pred: 91 % FEF 25-75  Value: 1.31 L/min   Pred: 2.16 L/min     % Pred: 60 %  Post-Spirometry FVC    Value: 3.51 L/min   Pred: 3.71 L/min     % Pred: 94 % FEV1    Value: 2.39 L     Pred: 2.74 L     % Pred: 87 % FEV1/FVC  Value: 68 %     Pred: 74 %     % Pred: 92 % FEF 25-75  Value: 1.39 L/min   Pred: 2.16 L/min     % Pred: 64 %  Lung Volumes TLC    Value: 5.36 L   % Pred: 89 % RV    Value: 1.84 L   % Pred: 86 % DLCO    Value: 14.41 %   % Pred: 56 % DLCO/VA  Value: 2.83 %   % Pred: 66 %  Comments: Mild obstruction.  No bronchodilator response.  Normal lung volumes.  Moderate diffusion defect. Clinical Lists Changes  Observations: Added new observation of PFT COMMENTS: Mild obstruction.  No bronchodilator response.  Normal lung volumes.  Moderate diffusion defect. (01/24/2011 15:46) Added new observation of DLCO/VA%EXP: 66 % (01/24/2011 15:46) Added new observation of DLCO/VA: 2.83 % (01/24/2011 15:46) Added new observation of DLCO % EXPEC: 56 % (01/24/2011 15:46) Added new observation of DLCO: 14.41 % (01/24/2011 15:46) Added new observation of RV % EXPECT: 86 % (01/24/2011 15:46) Added new observation of RV: 1.84 L (01/24/2011 15:46) Added new observation of TLC % EXPECT: 89 % (01/24/2011 15:46) Added new observation of TLC: 5.36 L (01/24/2011 15:46) Added new observation of FEF2575%EXPS: 64 % (01/24/2011 15:46) Added new observation of PSTFEF25/75%: 1.39 L/min (01/24/2011 15:46) Added new observation of PSTFEV1/FCV%: 92 % (01/24/2011 15:46) Added new observation of PSTFEV1/FVC: 68 % (01/24/2011 15:46) Added new observation of POSTFEV1%PRD: 87 % (01/24/2011 15:46) Added new observation of POST  FEV1: 2.39 L/min (01/24/2011 15:46) Added new observation of POST FVC%EXP: 94 % (01/24/2011 15:46) Added new observation of POST FVC: 3.51 L (01/24/2011 15:46) Added new observation of PSTFEF25/75P: 2.16  (01/24/2011 15:46) Added new observation of FEV1FVCPRDPS: 74 % (01/24/2011 15:46) Added new observation of FEV1PRDPST: 2.74 L (01/24/2011 15:46) Added new observation of FVCPRDPST: 3.71 L/min (01/24/2011 15:46) Added new observation of FEF % EXPEC: 60 % (01/24/2011 15:46) Added new observation of FEF25-75%PRE: 2.16 L/min (01/24/2011 15:46) Added new observation of FEF 25-75%: 1.31 L/min (01/24/2011 15:46) Added new observation of FEV1/FVC%EXP: 91 % (01/24/2011 15:46) Added new observation of FEV1/FVC PRE: 74 % (01/24/2011 15:46) Added new observation of FEV1/FVC: 68 % (01/24/2011 15:46) Added new observation of FEV1 % EXP: 86 % (01/24/2011 15:46) Added new observation of FEV1 PREDICT: 2.74 L (01/24/2011 15:46) Added new observation of FEV1: 2.37 L (01/24/2011 15:46) Added new observation of FVC % EXPECT: 94 % (01/24/2011 15:46) Added new observation of FVC PREDICT: 3.71 L (01/24/2011 15:46) Added new observation of FVC: 3.51 L (01/24/2011 15:46) Added new observation of PFT DATE: 01/24/2011  (01/24/2011 15:46)

## 2011-02-10 NOTE — Progress Notes (Signed)
Summary: fatigue  Phone Note Call from Patient Call back at Home Phone [phone]   Caller: Patient Call For: sood Summary of Call: pt feels "tired". he says when he last spoke to nurse (he mentioned armita) he says she was going to speak to dr sood about putting pt on prednisone. [facility]. he will explain his condition "further" to the nurse.  Initial call taken by: Tivis Ringer, CNA,  February 01, 2011 9:01 AM  Follow-up for Phone Call        PT wants to know if he can try a round of Prednisone,  He states that Dr Craige Cotta has mentioned this to him before and pt wants to try this due ti his c/o increased fatigue , wheezing in am's and non prod cough.  Please advise. Abigail Miyamoto RN  February 01, 2011 10:32 AM   Additional Follow-up for Phone Call Additional follow up Details #1::        Spoke with pt.  He has more cough, clear sputum, wheeze, associated with more fatigue.  He denies fever, chest pain, or hemoptysis.  Reviewed his PFT results.  Will send script for prednisone taper and start symbicort (pt has hx of glaucoma so will hold spiriva). Additional Follow-up by: Coralyn Helling MD,  February 01, 2011 12:51 PM    New/Updated Medications: SPIRIVA HANDIHALER 18 MCG CAPS (TIOTROPIUM BROMIDE MONOHYDRATE) one puff once daily PREDNISONE 10 MG TABS (PREDNISONE) 3 pills for 2 days, 2 pills for 2 days, 1 pill for 2 days, 1/2 pill for 2 days SYMBICORT 160-4.5 MCG/ACT AERO (BUDESONIDE-FORMOTEROL FUMARATE) two puffs two times a day Prescriptions: SYMBICORT 160-4.5 MCG/ACT AERO (BUDESONIDE-FORMOTEROL FUMARATE) two puffs two times a day  #1 x 6   Entered and Authorized by:   Coralyn Helling MD   Signed by:   Coralyn Helling MD on 02/01/2011   Method used:   Electronically to        [facility]* (retail)       [address]       [address] Shipping/mailing       [place], [place]  [address]       Ph: [phone]       Fax: [phone]  RxID:   [id number] PREDNISONE 10 MG TABS (PREDNISONE) 3 pills for 2 days, 2 pills for 2 days, 1 pill for 2 days, 1/2 pill for 2 days  #13 x 0   Entered and Authorized by:   Coralyn Helling MD   Signed by:   Coralyn Helling MD on 02/01/2011   Method used:   Electronically to        [facility]* (retail)       [address]       [address] Shipping/mailing       [place], [place]  [address]       Ph: [phone]       Fax: [phone]   RxID:   [id number] SPIRIVA HANDIHALER 18 MCG CAPS (TIOTROPIUM BROMIDE MONOHYDRATE) one puff once daily  #30 x 6   Entered and Authorized by:   Coralyn Helling MD   Signed by:   Coralyn Helling MD on 02/01/2011   Method used:   Electronically to        [facility]* (retail)       [address]       [address] Shipping/mailing  [place], [place]  [address]       Ph: [phone]       Fax: [phone]   RxID:   [id number]

## 2011-02-11 ENCOUNTER — Telehealth (INDEPENDENT_AMBULATORY_CARE_PROVIDER_SITE_OTHER): Payer: Self-pay | Admitting: *Deleted

## 2011-02-14 ENCOUNTER — Other Ambulatory Visit: Payer: Self-pay | Admitting: Internal Medicine

## 2011-02-14 ENCOUNTER — Encounter (HOSPITAL_BASED_OUTPATIENT_CLINIC_OR_DEPARTMENT_OTHER): Payer: Commercial Managed Care - PPO | Admitting: Internal Medicine

## 2011-02-14 DIAGNOSIS — Z5111 Encounter for antineoplastic chemotherapy: Secondary | ICD-10-CM

## 2011-02-14 DIAGNOSIS — C3490 Malignant neoplasm of unspecified part of unspecified bronchus or lung: Secondary | ICD-10-CM

## 2011-02-14 DIAGNOSIS — C3430 Malignant neoplasm of lower lobe, unspecified bronchus or lung: Secondary | ICD-10-CM

## 2011-02-14 LAB — CBC WITH DIFFERENTIAL/PLATELET
BASO%: 0.8 % (ref 0.0–2.0)
HCT: 31.4 % — ABNORMAL LOW (ref 38.4–49.9)
LYMPH%: 16.3 % (ref 14.0–49.0)
MCH: 28.9 pg (ref 27.2–33.4)
MCHC: 32.5 g/dL (ref 32.0–36.0)
MCV: 89 fL (ref 79.3–98.0)
MONO#: 0.8 10*3/uL (ref 0.1–0.9)
MONO%: 8.2 % (ref 0.0–14.0)
NEUT%: 73.6 % (ref 39.0–75.0)
Platelets: 345 10*3/uL (ref 140–400)
WBC: 10.2 10*3/uL (ref 4.0–10.3)

## 2011-02-14 LAB — COMPREHENSIVE METABOLIC PANEL
AST: 15 U/L (ref 0–37)
BUN: 21 mg/dL (ref 6–23)
CO2: 26 mEq/L (ref 19–32)
Calcium: 9.7 mg/dL (ref 8.4–10.5)
Chloride: 102 mEq/L (ref 96–112)
Creatinine, Ser: 1.16 mg/dL (ref 0.40–1.50)
Total Bilirubin: 0.3 mg/dL (ref 0.3–1.2)

## 2011-02-15 NOTE — Progress Notes (Signed)
Summary: wants to d/c Symbicort and Spiriva  Phone Note Call from Patient   Caller: Todd Villanueva Call For: sood Summary of Call: symbicort and spiriva make him cough alot needs advise call him [phone] Initial call taken by: Oneita Jolly,  February 11, 2011 8:54 AM  Follow-up for Phone Call        Pt c/o coughing non stop for approx 2 hours everytime after he uses his Symbicort and Spiriva. Also c/o coughing to the point of feeling dizzy. Pt states his O2-100%RA when he went to the [facility] and wants to know if he can come of the inhalers. Pt is aware VS is out of the office but would like a response soon. Please advise. Thanks. [name] CMA  February 11, 2011 9:35 AM   Additional Follow-up for Phone Call Additional follow up Details #1::        He can stop inhalers and monitor symptoms. Additional Follow-up by: Coralyn Helling MD,  February 11, 2011 4:51 PM    Additional Follow-up for Phone Call Additional follow up Details #2::    Spoke with pt and notified of the above respose per VS.  Pt verbalized understanding and denies any questions.  He will call if has any issues. Follow-up by: Vernie Murders,  February 11, 2011 5:01 PM

## 2011-02-17 LAB — CBC
HCT: 28.8 % — ABNORMAL LOW (ref 39.0–52.0)
Hemoglobin: 9.7 g/dL — ABNORMAL LOW (ref 13.0–17.0)
WBC: 7.5 10*3/uL (ref 4.0–10.5)

## 2011-02-21 ENCOUNTER — Other Ambulatory Visit: Payer: Self-pay | Admitting: Internal Medicine

## 2011-02-21 ENCOUNTER — Encounter (HOSPITAL_BASED_OUTPATIENT_CLINIC_OR_DEPARTMENT_OTHER): Payer: Commercial Managed Care - PPO | Admitting: Internal Medicine

## 2011-02-21 DIAGNOSIS — Z5111 Encounter for antineoplastic chemotherapy: Secondary | ICD-10-CM

## 2011-02-21 DIAGNOSIS — C3490 Malignant neoplasm of unspecified part of unspecified bronchus or lung: Secondary | ICD-10-CM

## 2011-02-21 DIAGNOSIS — C3430 Malignant neoplasm of lower lobe, unspecified bronchus or lung: Secondary | ICD-10-CM

## 2011-02-21 LAB — COMPREHENSIVE METABOLIC PANEL
ALT: 11 U/L (ref 0–53)
AST: 14 U/L (ref 0–37)
Alkaline Phosphatase: 100 U/L (ref 39–117)
BUN: 25 mg/dL — ABNORMAL HIGH (ref 6–23)
Creatinine, Ser: 1.09 mg/dL (ref 0.40–1.50)
Total Bilirubin: 0.4 mg/dL (ref 0.3–1.2)

## 2011-02-21 LAB — CBC WITH DIFFERENTIAL/PLATELET
BASO%: 0.5 % (ref 0.0–2.0)
Basophils Absolute: 0 10*3/uL (ref 0.0–0.1)
EOS%: 1.8 % (ref 0.0–7.0)
HCT: 27.5 % — ABNORMAL LOW (ref 38.4–49.9)
HGB: 8.9 g/dL — ABNORMAL LOW (ref 13.0–17.1)
LYMPH%: 12.7 % — ABNORMAL LOW (ref 14.0–49.0)
MCH: 28.4 pg (ref 27.2–33.4)
MCHC: 32.4 g/dL (ref 32.0–36.0)
MCV: 87.9 fL (ref 79.3–98.0)
MONO%: 7.9 % (ref 0.0–14.0)
NEUT%: 77.1 % — ABNORMAL HIGH (ref 39.0–75.0)
Platelets: 307 10*3/uL (ref 140–400)
lymph#: 1 10*3/uL (ref 0.9–3.3)

## 2011-02-28 ENCOUNTER — Encounter (HOSPITAL_BASED_OUTPATIENT_CLINIC_OR_DEPARTMENT_OTHER): Payer: Commercial Managed Care - PPO | Admitting: Internal Medicine

## 2011-02-28 ENCOUNTER — Other Ambulatory Visit: Payer: Self-pay | Admitting: Internal Medicine

## 2011-02-28 DIAGNOSIS — Z5111 Encounter for antineoplastic chemotherapy: Secondary | ICD-10-CM

## 2011-02-28 DIAGNOSIS — C3490 Malignant neoplasm of unspecified part of unspecified bronchus or lung: Secondary | ICD-10-CM

## 2011-02-28 DIAGNOSIS — C3430 Malignant neoplasm of lower lobe, unspecified bronchus or lung: Secondary | ICD-10-CM

## 2011-02-28 LAB — CBC WITH DIFFERENTIAL/PLATELET
BASO%: 1.1 % (ref 0.0–2.0)
Basophils Absolute: 0.1 10*3/uL (ref 0.0–0.1)
EOS%: 1.4 % (ref 0.0–7.0)
HCT: 27.6 % — ABNORMAL LOW (ref 38.4–49.9)
HGB: 9 g/dL — ABNORMAL LOW (ref 13.0–17.1)
LYMPH%: 13.1 % — ABNORMAL LOW (ref 14.0–49.0)
MCH: 29.1 pg (ref 27.2–33.4)
MCHC: 32.6 g/dL (ref 32.0–36.0)
NEUT%: 73.9 % (ref 39.0–75.0)
Platelets: 265 10*3/uL (ref 140–400)
lymph#: 0.9 10*3/uL (ref 0.9–3.3)

## 2011-02-28 LAB — COMPREHENSIVE METABOLIC PANEL
ALT: 12 U/L (ref 0–53)
AST: 17 U/L (ref 0–37)
BUN: 22 mg/dL (ref 6–23)
CO2: 22 mEq/L (ref 19–32)
Calcium: 9.3 mg/dL (ref 8.4–10.5)
Chloride: 102 mEq/L (ref 96–112)
Creatinine, Ser: 1.16 mg/dL (ref 0.40–1.50)
Total Bilirubin: 0.3 mg/dL (ref 0.3–1.2)

## 2011-03-03 NOTE — Consult Note (Signed)
Summary: [facility]  [facility]   Imported By: Lennie Odor 02/21/2011 14:42:19  _____________________________________________________________________  External Attachment:    Type:   Image     Comment:   External Document

## 2011-03-07 ENCOUNTER — Other Ambulatory Visit: Payer: Self-pay | Admitting: Internal Medicine

## 2011-03-07 ENCOUNTER — Encounter (HOSPITAL_BASED_OUTPATIENT_CLINIC_OR_DEPARTMENT_OTHER): Payer: Commercial Managed Care - PPO | Admitting: Internal Medicine

## 2011-03-07 DIAGNOSIS — C3490 Malignant neoplasm of unspecified part of unspecified bronchus or lung: Secondary | ICD-10-CM

## 2011-03-07 DIAGNOSIS — Z5111 Encounter for antineoplastic chemotherapy: Secondary | ICD-10-CM

## 2011-03-07 DIAGNOSIS — C3430 Malignant neoplasm of lower lobe, unspecified bronchus or lung: Secondary | ICD-10-CM

## 2011-03-07 LAB — CBC WITH DIFFERENTIAL/PLATELET
Basophils Absolute: 0 10*3/uL (ref 0.0–0.1)
Eosinophils Absolute: 0 10*3/uL (ref 0.0–0.5)
HCT: 26.9 % — ABNORMAL LOW (ref 38.4–49.9)
HGB: 8.9 g/dL — ABNORMAL LOW (ref 13.0–17.1)
LYMPH%: 11.2 % — ABNORMAL LOW (ref 14.0–49.0)
MONO#: 0.5 10*3/uL (ref 0.1–0.9)
NEUT%: 78.9 % — ABNORMAL HIGH (ref 39.0–75.0)
Platelets: 169 10*3/uL (ref 140–400)
WBC: 5.6 10*3/uL (ref 4.0–10.3)
lymph#: 0.6 10*3/uL — ABNORMAL LOW (ref 0.9–3.3)

## 2011-03-07 LAB — COMPREHENSIVE METABOLIC PANEL
ALT: 12 U/L (ref 0–53)
BUN: 28 mg/dL — ABNORMAL HIGH (ref 6–23)
CO2: 22 mEq/L (ref 19–32)
Calcium: 8.8 mg/dL (ref 8.4–10.5)
Chloride: 102 mEq/L (ref 96–112)
Creatinine, Ser: 1.23 mg/dL (ref 0.40–1.50)
Glucose, Bld: 80 mg/dL (ref 70–99)
Total Bilirubin: 0.3 mg/dL (ref 0.3–1.2)

## 2011-03-12 LAB — CBC
HCT: 33.3 % — ABNORMAL LOW (ref 39.0–52.0)
Hemoglobin: 11.4 g/dL — ABNORMAL LOW (ref 13.0–17.0)
MCHC: 34.4 g/dL (ref 30.0–36.0)
MCV: 97.3 fL (ref 78.0–100.0)
Platelets: 246 10*3/uL (ref 150–400)
RBC: 3.43 MIL/uL — ABNORMAL LOW (ref 4.22–5.81)
RDW: 14.1 % (ref 11.5–15.5)
WBC: 7.7 10*3/uL (ref 4.0–10.5)

## 2011-03-12 LAB — BASIC METABOLIC PANEL
Chloride: 103 mEq/L (ref 96–112)
Creatinine, Ser: 1.17 mg/dL (ref 0.4–1.5)
GFR calc Af Amer: >60 mL/min (ref >60)
Potassium: 4.3 mEq/L (ref 3.5–5.1)
Sodium: 138 mEq/L (ref 135–145)

## 2011-03-13 LAB — CBC
HCT: 33.8 % — ABNORMAL LOW (ref 39.0–52.0)
Platelets: 208 10*3/uL (ref 150–400)
WBC: 10.3 10*3/uL (ref 4.0–10.5)

## 2011-03-13 LAB — BASIC METABOLIC PANEL
BUN: 14 mg/dL (ref 6–23)
GFR calc non Af Amer: >60 mL/min (ref >60)
Potassium: 3.8 mEq/L (ref 3.5–5.1)

## 2011-03-14 ENCOUNTER — Other Ambulatory Visit: Payer: Self-pay | Admitting: Internal Medicine

## 2011-03-14 ENCOUNTER — Encounter (HOSPITAL_BASED_OUTPATIENT_CLINIC_OR_DEPARTMENT_OTHER): Payer: 59 | Admitting: Internal Medicine

## 2011-03-14 DIAGNOSIS — C3490 Malignant neoplasm of unspecified part of unspecified bronchus or lung: Secondary | ICD-10-CM

## 2011-03-14 DIAGNOSIS — C3430 Malignant neoplasm of lower lobe, unspecified bronchus or lung: Secondary | ICD-10-CM

## 2011-03-14 DIAGNOSIS — Z5111 Encounter for antineoplastic chemotherapy: Secondary | ICD-10-CM

## 2011-03-14 LAB — CBC WITH DIFFERENTIAL/PLATELET
Basophils Absolute: 0 10*3/uL (ref 0.0–0.1)
HCT: 27 % — ABNORMAL LOW (ref 38.4–49.9)
HGB: 9.2 g/dL — ABNORMAL LOW (ref 13.0–17.1)
MCH: 30.5 pg (ref 27.2–33.4)
MONO#: 0.1 10*3/uL (ref 0.1–0.9)
NEUT%: 82.4 % — ABNORMAL HIGH (ref 39.0–75.0)
WBC: 3.4 10*3/uL — ABNORMAL LOW (ref 4.0–10.3)
lymph#: 0.4 10*3/uL — ABNORMAL LOW (ref 0.9–3.3)

## 2011-03-21 ENCOUNTER — Encounter (HOSPITAL_BASED_OUTPATIENT_CLINIC_OR_DEPARTMENT_OTHER): Payer: Commercial Managed Care - PPO | Admitting: Internal Medicine

## 2011-03-21 ENCOUNTER — Other Ambulatory Visit: Payer: Self-pay | Admitting: Internal Medicine

## 2011-03-21 DIAGNOSIS — C3430 Malignant neoplasm of lower lobe, unspecified bronchus or lung: Secondary | ICD-10-CM

## 2011-03-21 DIAGNOSIS — C3490 Malignant neoplasm of unspecified part of unspecified bronchus or lung: Secondary | ICD-10-CM

## 2011-03-21 DIAGNOSIS — Z5111 Encounter for antineoplastic chemotherapy: Secondary | ICD-10-CM

## 2011-03-21 LAB — COMPREHENSIVE METABOLIC PANEL
AST: 16 U/L (ref 0–37)
Alkaline Phosphatase: 111 U/L (ref 39–117)
BUN: 35 mg/dL — ABNORMAL HIGH (ref 6–23)
Calcium: 8.7 mg/dL (ref 8.4–10.5)
Creatinine, Ser: 1.2 mg/dL (ref 0.40–1.50)

## 2011-03-21 LAB — CBC WITH DIFFERENTIAL/PLATELET
Basophils Absolute: 0 10*3/uL (ref 0.0–0.1)
EOS%: 1.3 % (ref 0.0–7.0)
HCT: 26.4 % — ABNORMAL LOW (ref 38.4–49.9)
HGB: 8.8 g/dL — ABNORMAL LOW (ref 13.0–17.1)
MCH: 29.6 pg (ref 27.2–33.4)
MCV: 88.9 fL (ref 79.3–98.0)
MONO%: 9.2 % (ref 0.0–14.0)
NEUT%: 79 % — ABNORMAL HIGH (ref 39.0–75.0)
Platelets: 95 10*3/uL — ABNORMAL LOW (ref 140–400)

## 2011-03-22 ENCOUNTER — Encounter: Payer: Commercial Managed Care - PPO | Admitting: Internal Medicine

## 2011-04-07 ENCOUNTER — Other Ambulatory Visit: Payer: Self-pay | Admitting: Radiation Oncology

## 2011-04-07 DIAGNOSIS — C3490 Malignant neoplasm of unspecified part of unspecified bronchus or lung: Secondary | ICD-10-CM

## 2011-04-08 ENCOUNTER — Encounter (HOSPITAL_COMMUNITY): Payer: Self-pay

## 2011-04-08 ENCOUNTER — Ambulatory Visit (HOSPITAL_COMMUNITY)
Admission: RE | Admit: 2011-04-08 | Discharge: 2011-04-08 | Disposition: A | Discharge status: Home / Self Care | Payer: 59 | Source: Ambulatory Visit | Attending: Radiation Oncology | Admitting: Radiation Oncology

## 2011-04-08 DIAGNOSIS — M47817 Spondylosis without myelopathy or radiculopathy, lumbosacral region: Secondary | ICD-10-CM | POA: Insufficient documentation

## 2011-04-08 DIAGNOSIS — M1610 Unilateral primary osteoarthritis, unspecified hip: Secondary | ICD-10-CM | POA: Insufficient documentation

## 2011-04-08 DIAGNOSIS — M87059 Idiopathic aseptic necrosis of unspecified femur: Secondary | ICD-10-CM | POA: Insufficient documentation

## 2011-04-08 DIAGNOSIS — C3490 Malignant neoplasm of unspecified part of unspecified bronchus or lung: Secondary | ICD-10-CM | POA: Insufficient documentation

## 2011-04-08 DIAGNOSIS — R222 Localized swelling, mass and lump, trunk: Secondary | ICD-10-CM | POA: Insufficient documentation

## 2011-04-08 DIAGNOSIS — R109 Unspecified abdominal pain: Secondary | ICD-10-CM | POA: Insufficient documentation

## 2011-04-08 DIAGNOSIS — I700 Atherosclerosis of aorta: Secondary | ICD-10-CM | POA: Insufficient documentation

## 2011-04-08 DIAGNOSIS — J438 Other emphysema: Secondary | ICD-10-CM | POA: Insufficient documentation

## 2011-04-08 DIAGNOSIS — R110 Nausea: Secondary | ICD-10-CM | POA: Insufficient documentation

## 2011-04-08 DIAGNOSIS — M169 Osteoarthritis of hip, unspecified: Secondary | ICD-10-CM | POA: Insufficient documentation

## 2011-04-08 DIAGNOSIS — R0602 Shortness of breath: Secondary | ICD-10-CM | POA: Insufficient documentation

## 2011-04-08 DIAGNOSIS — Q619 Cystic kidney disease, unspecified: Secondary | ICD-10-CM | POA: Insufficient documentation

## 2011-04-08 HISTORY — DX: Malignant (primary) neoplasm, unspecified: C80.1

## 2011-04-08 MED ORDER — IOHEXOL 300 MG/ML  SOLN
100.0000 mL | Freq: Once | INTRAMUSCULAR | Status: AC | PRN
  Administered 2011-04-08: 100 mL via INTRAVENOUS

## 2011-04-15 ENCOUNTER — Ambulatory Visit: Payer: 59 | Attending: Radiation Oncology | Admitting: Radiation Oncology

## 2011-04-19 NOTE — Cardiovascular Report (Signed)
Todd Villanueva, Todd Villanueva NO.:  [id number]   MEDICAL RECORD NO.:  [id number]          PATIENT TYPE:  INP   LOCATION:  2506                         FACILITY:  [facility]   PHYSICIAN:  Jake Bathe, MD      DATE OF BIRTH:  1941/08/05   DATE OF PROCEDURE:  06/29/2009  DATE OF DISCHARGE:                            CARDIAC CATHETERIZATION   PROCEDURE:  1. Left heart catheterization  2. Selective coronary angiography.  3. Left ventriculogram.   INDICATIONS:  A 70 year old male with progressive stable angina with  hypertension, hyperlipidemia, and tobacco use with substernal chest pain  with radiation across the chest wall described as a pressure, moderate  in intensity, worse with exertion.  He took a friend's nitroglycerin and  the pain resolved promptly.  He is also complaining of left greater than  right leg claudication with walking which will be evaluated at a later  date.   PROCEDURE DETAILS:  Informed consent was obtained.  Risk of stroke,  heart attack, death, renal impairment, bleeding, and arterial damage  were explained to the patient at length.  He was placed on the  catheterization table and prepped in a sterile fashion.  Lidocaine 1%  was used for local anesthesia.  The modified Seldinger technique was  used to insert a 5-French sheath into the right femoral artery.  Visualization of femoral head was obtained via fluoroscopy prior to  procedure.  A Judkins left #4 catheter and a Judkins right #4 catheter  were used to selectively cannulate the coronary arteries.  Multiple  views with hand injection of Omnipaque were obtained.  An angled pigtail  was then used to cross into the left ventricle.  Hemodynamics obtained  and an LV-gram in the RAO position utilizing 35 mL of contrast was also  obtained.   FINDINGS:  1. Left main artery - branches into the LAD, ramus, and circumflex      branch.  There is no angiographically significant coronary artery  disease present.  2. Left anterior descending artery - just prior to the first septal      branch and an adequate place of landing zone, there is the      beginning of diffuse stenosis up to 80% and a long segment of      approximately 35 mm up to a reasonably size diagonal branch.  The      remainder of the LAD is moderate in size in caliber and proceeds to      run to the apex.  3. Circumflex artery - there are minor irregularities throughout this      system with 1 significant obtuse marginal branch present.  4. Right coronary artery - this is the dominant vessel giving rise to      the posterior descending artery.  There is a focal 90% lesion in      the mid right coronary artery.  5. Left ventriculogram - there is no wall motion abnormality present      and left ventricular ejection fraction is 65%.  The remainder of      the aorta  does not demonstrate any evidence of abdominal aortic      aneurysm.   The pressures are as follows:  Left ventricular systolic pressure 162  with an end-diastolic pressure of 15 mmHg.  Aortic pressure 162/72 with  a mean of 103 mmHg.   IMPRESSION:  1. Severe two-vessel coronary artery disease:  90% mid right coronary      artery, 80% diffuse long proximal to mid left anterior descending      artery lesion, moderately calcified in its proximal region.  2. Normal left ventricular ejection fraction of 65% with no wall      motion abnormalities.  3. Calcified aortic arch.  4. No evidence of abdominal aortic aneurysm.   PLAN:  Findings have been discussed with both the patient and Dr.  Eldridge Dace who will be performing coronary intervention of his right  coronary artery.  At a later date, he will be returned to the Cath Lab  for left anterior descending artery intervention.  Risk of procedure  have been explained at length.  Continue with current aggressive risk  factor modification especially tobacco cessation.  He denied any need  for elective surgery.   Continue statin medication, simvastatin, ACE  inhibitor.  As you stated previously, I will investigate his left  extremity claudication at a later date.  Last LDL cholesterol was 103,  new goal is 70.      Jake Bathe, MD  Electronically Signed     MCS/MEDQ  D:  06/29/2009  T:  06/30/2009  Job:  [id number]   cc:   Candyce Churn, M.D.

## 2011-04-19 NOTE — Cardiovascular Report (Signed)
NAMEGRAYSIN, LUCZYNSKI NO.:  [id number]   MEDICAL RECORD NO.:  [id number]          PATIENT TYPE:  INP   LOCATION:  2506                         FACILITY:  [facility]   PHYSICIAN:  Corky Crafts, MDDATE OF BIRTH:  18-Oct-1941   DATE OF PROCEDURE:  06/29/2009  DATE OF DISCHARGE:                            CARDIAC CATHETERIZATION   REFERRING PHYSICIAN:  Mark C. Skains, MD   PROCEDURES PERFORMED:  Percutaneous coronary intervention of the right  coronary artery.   OPERATOR:  Corky Crafts, MD   INDICATIONS:  Angina.   PROCEDURE NARRATIVE:  The diagnostic catheterization was performed by  Dr. Anne Fu showing significant disease in the LAD as well as in the RCA  distribution.  A JR-4 guiding catheter was placed in the ostium of the  right coronary artery.  Prowater wire was placed across the lesion in  the mid-right coronary artery.  A 2.5 x 15 apex balloon was placed  across the lesion and inflated to 8 atmospheres for 30 seconds.  A 3.0 x  18 Endeavor stent was then deployed at 12 atmospheres for 35 seconds.  The stent was postdilated with a 3.25 x 15 Wales Sprinter and inflated to  18 atmospheres for 35 seconds.  The 90% stenosis was brought to zero.  There was TIMI III flow.  Intracoronary nitroglycerin was administered.  A 90% posterolateral artery lesion was subsequently noted.  A 2.25 x 16  Taxus Adam stent was then advanced to the lesion and deployed at 12  atmospheres for 40 seconds.  There was no residual stenosis.  There  appeared to be some vessel spasm just proximal to the stent which  improved with nitroglycerin.   IMPRESSION:  1. Successful percutaneous coronary intervention of the mid-right      coronary artery with a 3.0 x 18 Endeavor stent post-dilated to 3.4      mm in diameter.  2. Successful percutaneous coronary intervention of the posterolateral      artery with a 2.25 x 16 Taxus Atom stent.   RECOMMENDATIONS:  Continue aspirin and  Plavix indefinitely.  We will  plan on bringing him back for PCI of the LAD at some point.      Corky Crafts, MD  Electronically Signed     JSV/MEDQ  D:  06/29/2009  T:  06/30/2009  Job:  [id number]   cc:   Candyce Churn, M.D.

## 2011-04-19 NOTE — Discharge Summary (Signed)
NAMEGARRELL, FLAGG NO.:  [id number]   MEDICAL RECORD NO.:  [id number]          PATIENT TYPE:  INP   LOCATION:  2507                         FACILITY:  [facility]   PHYSICIAN:  Corky Crafts, MDDATE OF BIRTH:  1941/09/27   DATE OF ADMISSION:  07/06/2009  DATE OF DISCHARGE:  07/07/2009                               DISCHARGE SUMMARY   REFERRING PHYSICIANS:  1. Candyce Churn, MD  2. Jake Bathe, MD   DISCHARGE DIAGNOSES:  1. Coronary artery disease.  2. Hypertension  3. Hyperlipidemia.  4. Reflux.   DISCHARGE MEDICATIONS:  1. Aspirin 325 mg daily.  2. Plavix 75 mg daily.  3. Lisinopril 40 mg daily.  4. Simvastatin 40 mg daily.  5. Celexa 20 mg daily.  6. Sublingual nitro p.r.n.  7. Protonix 40 mg daily.  8. Vitamin D.  9. Vitamin E.   HOSPITAL COURSE:  The patient was admitted and had a cardiac  catheterization on July 06, 2009.  He underwent drug-eluting stent  placement to the LAD.  This went well and he had no complications.  Manual compression was applied to his right groin.  There was no  significant bleeding or hematoma.  He has not had any chest pain.  His  blood work was stable and he will be discharged from the hospital.   DISCHARGE INSTRUCTIONS:  Increase activity slowly.  No lifting more than  10 pounds for about a week.   DIET:  Low-fat, low-salt diet.   FOLLOWUP:  He will see Dr. Anne Fu on July 13, 2009 in our office.   Of note, his PPI was changed from Nexium.  A prescription will be called  in for the Protonix because he is on Plavix.      Corky Crafts, MD  Electronically Signed     JSV/MEDQ  D:  07/07/2009  T:  07/07/2009  Job:  [id number]

## 2011-04-19 NOTE — Cardiovascular Report (Signed)
Todd Villanueva, SARLI NO.:  [id number]   MEDICAL RECORD NO.:  [id number]          PATIENT TYPE:  INP   LOCATION:  2507                         FACILITY:  [facility]   PHYSICIAN:  Corky Crafts, MDDATE OF BIRTH:  [birth year]   DATE OF PROCEDURE:  07/06/2009  DATE OF DISCHARGE:                            CARDIAC CATHETERIZATION   REFERRING PHYSICIAN:  Candyce Churn, MD   PROCEDURES PERFORMED:  1. Coronary angiogram.  2. Percutaneous coronary intervention of the left anterior descending.   OPERATOR:  Corky Crafts, MD   INDICATIONS:  Coronary artery disease and stable angina.   PROCEDURE NARRATIVE:  The risks and benefits of PCI were explained to  the patient and informed consent was obtained.  He was brought to the  cath lab.  He was prepped and draped in the usual sterile fashion.  His  right groin was infiltrated with 1% lidocaine.  A 6-French sheath was  placed into the right femoral artery using the modified Seldinger  technique.  Right coronary artery angiography was performed using a JR-4  pigtail catheter.  The catheter was advanced to the vessel ostium under  fluoroscopic guidance.  Digital angiography was performed in multiple  projections using hand injection of contrast.  A CLS 3.5 guiding  catheter was then advanced to the ostium of the left main.  A Prowater  wire was placed across the lesion in the LAD.  A 2.0 x 20 balloon was  used to predilate the lesion without a significant difference.  A 2.5 x  20 balloon was then placed across the lesion and deployed at 14  atmospheres for 35 seconds and again for 20 seconds.  A 2.5 x 32 Taxus  Liberte stent was then placed across the lesion and deployed at 14  atmospheres for 40 seconds.  The stent was post-dilated with a 3.0 x 20  Voyager balloon inflated to 10 atmospheres for 22 seconds in the distal  portion of the stent and then 16 atmospheres for 25 seconds in the more  proximal portion  of the stent.  There was an excellent angiographic  result.  There was no residual stenosis.  A StarClose was used but  unsuccessful in achieving hemostasis.  Fem-stop was then placed.  Angiomax was used for anticoagulation.   IMPRESSION:  1. Successful percutaneous coronary intervention of the left anterior      descending with a 2.5 x 32 Taxus Liberte stent post-dilated to 3.1      mm in diameter.  2. Patent right coronary artery stents.   RECOMMENDATIONS:  Continue aspirin and Plavix indefinitely along with  secondary prevention.  He needs to stop smoking.  He will follow up with  Dr. Anne Fu.     Corky Crafts, MD  Electronically Signed    JSV/MEDQ  D:  07/06/2009  T:  07/07/2009  Job:  [id number]

## 2011-04-19 NOTE — Discharge Summary (Signed)
Todd Villanueva, Todd Villanueva NO.:  [id number]   MEDICAL RECORD NO.:  [id number]          PATIENT TYPE:  INP   LOCATION:  2506                         FACILITY:  [facility]   PHYSICIAN:  Jake Bathe, MD      DATE OF BIRTH:  1941/03/29   DATE OF ADMISSION:  06/29/2009  DATE OF DISCHARGE:  06/30/2009                               DISCHARGE SUMMARY   PRIMARY CARE PHYSICIAN:  Candyce Churn, MD   FINAL DIAGNOSIS:  1.Severe two-vessel coronary artery disease - 90% mid  right coronary artery status post drug-eluting stent 3.0 mm x 18 mm as  well as distal posterior lateral stenosis, 90% lesion 20% with a Taxus  2.25 x 16 mm stent performed by Dr. Catalina Gravel.  Ejection fraction is  normal.  Left ventricular end-diastolic pressure 12-15.  He had minor  irregularities in the circumflex system.  He also had a ramus branch  without any significant disease.  1. GERD  2. Claudication  3. Anxiety  4. Hyperlipidemia   Planned percutaneous intervention to the left anterior descending artery  and a long proximal to mid lesion that is approximately calcified and  approximately 35 mm in length up to a diagonal branch.  This will be  performed in approximately 1 week by Dr. Catalina Gravel.   HOSPITAL COURSE:  Overnight observation was uneventful.  He had no  symptoms.  He is ambulating well at the time of discharge.  Groin was  stable without any discharge, hematoma, or bruit with 2+ pulses.  Lungs  were clear to auscultation bilaterally.  Heart was regular rate and  rhythm.  His heart rate trend was mostly in the low 60s.  Beta-blocker  was not used secondary to his relative low heart rate.  In regards to  his hyperlipidemia and hypertension, he will continue both statin and  ACE inhibitor.   DISCHARGE MEDICATIONS:  1. Aspirin 325 mg once a day.  2. Plavix 75 mg once a day.  3. Citalopram 40 mg once a day.  4. Nexium 40 mg once a day.  5. Nitroglycerin 0.4 mg p.r.n.  6.  Simvastatin 40 mg once a day.  7. Lisinopril 40 mg take one-half tablet daily.   PERTINENT LABS:  Creatinine at discharge 1.0, potassium 3.8.  Hemoglobin  11.7, hematocrit 33.8, white count 10.3, platelets 208.  In April 2009,  total cholesterol 180, triglycerides 146, LDL 103, HDL 50, new goal LDL  of 70.  Hemoglobin prior to catheterization was 12.4.   FOLLOWUP:  He will return to cardiac catheterization lab next week for  planned percutaneous intervention to the left anterior descending  artery.  He also has a followup appointment with me previously  scheduled. I would like him to avoid NSAIDS and use Tylenol for pain. We  may consider low dose omeprazole as PPI with Plavix. He did not tolerate  ranitadine in the past for GERD.      Jake Bathe, MD  Electronically Signed     MCS/MEDQ  D:  06/30/2009  T:  06/30/2009  Job:  [id number]  cc:   Candyce Churn, M.D.

## 2011-04-22 NOTE — Consult Note (Signed)
NAME:  Todd Villanueva, HEURING                         ACCOUNT NO.:  [id number]   MEDICAL RECORD NO.:  [id number]                   PATIENT TYPE:  EMS   LOCATION:  URG                                  FACILITY:  [facility]   PHYSICIAN:  Artist Pais. Mina Marble, M.D.           DATE OF BIRTH:  05-May-1941   DATE OF CONSULTATION:  02/09/2004  DATE OF DISCHARGE:                                   CONSULTATION   PHYSICIAN REQUESTING CONSULTATION:  Elvina Sidle, M.D.   REASON FOR CONSULTATION:  Mr. Hyatt Capobianco is a very pleasant 70 year old  left hand dominant male who was playing softball this past Saturday,  sustained injury to his nondominant left long finger with pain, swelling,  and deformity. He is an otherwise healthy 70 year old male, left hand  dominant, with no known drug allergies. He is currently on lisinopril and  omeprazole for hypertension and gastrointestinal reflux disease. He has had  knee surgery in the past by Dr. Trudee Grip arthroscopically in the past  six months. No other significant medical or surgical history.   FAMILY HISTORY:  Noncontributory   SOCIAL HISTORY:  Noncontributory except for cigarette smoking.   PHYSICAL EXAMINATION:  Exam today reveals a well-developed, well-nourished  male, pleasant __________ examination of his hand:  He has pain and swelling  over the proximal phalanx and mild rotatory deformity. He has x-ray that  shows spiral oblique fracture of the proximal phalanx with some  displacement, particularly on the lateral view.   IMPRESSION:  A 70 year old male with a displaced spiral oblique fracture of  his nondominant right long proximal phalanx status post softball injury 48  hours ago. At this point in time, we recommend he undergo ORIF for stable  internal fixation, early range of motion exercises, etc. We go ahead and set  this up as soon as possible as an outpatient for ORIF, right long finger  proximal phalanx this coming up Wednesday, February 11, 2004.                                               Artist Pais Mina Marble, M.D.    MAW/MEDQ  D:  02/09/2004  T:  02/09/2004  Job:  [id number]

## 2011-04-29 ENCOUNTER — Other Ambulatory Visit: Payer: Self-pay | Admitting: Internal Medicine

## 2011-04-29 DIAGNOSIS — C3490 Malignant neoplasm of unspecified part of unspecified bronchus or lung: Secondary | ICD-10-CM

## 2011-05-03 ENCOUNTER — Encounter (HOSPITAL_BASED_OUTPATIENT_CLINIC_OR_DEPARTMENT_OTHER): Payer: 59 | Admitting: Internal Medicine

## 2011-05-03 ENCOUNTER — Other Ambulatory Visit: Payer: Self-pay | Admitting: Internal Medicine

## 2011-05-03 DIAGNOSIS — C3430 Malignant neoplasm of lower lobe, unspecified bronchus or lung: Secondary | ICD-10-CM

## 2011-05-03 DIAGNOSIS — Z5111 Encounter for antineoplastic chemotherapy: Secondary | ICD-10-CM

## 2011-05-03 DIAGNOSIS — C3490 Malignant neoplasm of unspecified part of unspecified bronchus or lung: Secondary | ICD-10-CM

## 2011-05-03 LAB — CBC WITH DIFFERENTIAL/PLATELET
Basophils Absolute: 0 10*3/uL (ref 0.0–0.1)
EOS%: 1.3 % (ref 0.0–7.0)
Eosinophils Absolute: 0.1 10*3/uL (ref 0.0–0.5)
HCT: 27.4 % — ABNORMAL LOW (ref 38.4–49.9)
HGB: 9.5 g/dL — ABNORMAL LOW (ref 13.0–17.1)
MCH: 34 pg — ABNORMAL HIGH (ref 27.2–33.4)
MCV: 97.7 fL (ref 79.3–98.0)
NEUT#: 4.4 10*3/uL (ref 1.5–6.5)
NEUT%: 73 % (ref 39.0–75.0)
lymph#: 0.8 10*3/uL — ABNORMAL LOW (ref 0.9–3.3)

## 2011-05-03 LAB — COMPREHENSIVE METABOLIC PANEL
AST: 25 U/L (ref 0–37)
Albumin: 4.1 g/dL (ref 3.5–5.2)
BUN: 23 mg/dL (ref 6–23)
CO2: 24 mEq/L (ref 19–32)
Calcium: 9.3 mg/dL (ref 8.4–10.5)
Chloride: 102 mEq/L (ref 96–112)
Creatinine, Ser: 1.25 mg/dL (ref 0.40–1.50)
Glucose, Bld: 117 mg/dL — ABNORMAL HIGH (ref 70–99)

## 2011-05-04 ENCOUNTER — Other Ambulatory Visit: Payer: Self-pay | Admitting: Thoracic Surgery

## 2011-05-04 ENCOUNTER — Ambulatory Visit (INDEPENDENT_AMBULATORY_CARE_PROVIDER_SITE_OTHER): Payer: 59 | Admitting: Thoracic Surgery

## 2011-05-04 DIAGNOSIS — R918 Other nonspecific abnormal finding of lung field: Secondary | ICD-10-CM

## 2011-05-04 DIAGNOSIS — C3490 Malignant neoplasm of unspecified part of unspecified bronchus or lung: Secondary | ICD-10-CM

## 2011-05-05 NOTE — Assessment & Plan Note (Signed)
OFFICE VISIT  BASTION, BOLGER DOB:  1940-12-25                                        May 04, 2011 CHART #:  [id number]  The patient was treated with a IIIA or IIIB non-small-cell lung cancer with 6 chemotherapies and 6 weeks of radiation completed it on Apr 22, 2011.  His postoperative scan still showed some marked decrease in reduction in the mass in the right lower lobe and some reduction in his right peribronchial area.  I am going to go ahead and get a PET scan on him to see what the activity is in these areas as well as repeat his pulmonary function test to try determine whether he would be a benefit for right middle and right lower lobectomy.  I will see him back again in 1 week to make that decision.  Sats were 97%, his blood pressure is 139/83, pulse 92, respirations 20.  He has quit smoking for the last 3 months.  Ines Bloomer, M.D. Electronically Signed  DPB/MEDQ  D:  05/04/2011  T:  05/05/2011  Job:  [id number]

## 2011-05-10 ENCOUNTER — Encounter (HOSPITAL_COMMUNITY)
Admission: RE | Admit: 2011-05-10 | Discharge: 2011-05-10 | Disposition: A | Discharge status: Home / Self Care | Payer: 59 | Source: Ambulatory Visit | Attending: Thoracic Surgery | Admitting: Thoracic Surgery

## 2011-05-10 ENCOUNTER — Encounter (HOSPITAL_COMMUNITY): Payer: Self-pay

## 2011-05-10 ENCOUNTER — Ambulatory Visit (HOSPITAL_COMMUNITY)
Admission: RE | Admit: 2011-05-10 | Discharge: 2011-05-10 | Disposition: A | Discharge status: Home / Self Care | Payer: 59 | Source: Ambulatory Visit | Attending: Thoracic Surgery | Admitting: Thoracic Surgery

## 2011-05-10 DIAGNOSIS — C3430 Malignant neoplasm of lower lobe, unspecified bronchus or lung: Secondary | ICD-10-CM | POA: Insufficient documentation

## 2011-05-10 DIAGNOSIS — J4489 Other specified chronic obstructive pulmonary disease: Secondary | ICD-10-CM | POA: Insufficient documentation

## 2011-05-10 DIAGNOSIS — R918 Other nonspecific abnormal finding of lung field: Secondary | ICD-10-CM

## 2011-05-10 DIAGNOSIS — Z79899 Other long term (current) drug therapy: Secondary | ICD-10-CM | POA: Insufficient documentation

## 2011-05-10 DIAGNOSIS — C3490 Malignant neoplasm of unspecified part of unspecified bronchus or lung: Secondary | ICD-10-CM | POA: Insufficient documentation

## 2011-05-10 DIAGNOSIS — Z7982 Long term (current) use of aspirin: Secondary | ICD-10-CM | POA: Insufficient documentation

## 2011-05-10 DIAGNOSIS — I2510 Atherosclerotic heart disease of native coronary artery without angina pectoris: Secondary | ICD-10-CM | POA: Insufficient documentation

## 2011-05-10 DIAGNOSIS — J449 Chronic obstructive pulmonary disease, unspecified: Secondary | ICD-10-CM | POA: Insufficient documentation

## 2011-05-10 LAB — GLUCOSE, CAPILLARY: Glucose-Capillary: 97 mg/dL (ref 70–99)

## 2011-05-10 MED ORDER — FLUDEOXYGLUCOSE F - 18 (FDG) INJECTION
19.5000 | Freq: Once | INTRAVENOUS | Status: AC | PRN
  Administered 2011-05-10: 19.5 via INTRAVENOUS

## 2011-05-11 ENCOUNTER — Ambulatory Visit (INDEPENDENT_AMBULATORY_CARE_PROVIDER_SITE_OTHER): Payer: 59 | Admitting: Thoracic Surgery

## 2011-05-11 DIAGNOSIS — C3490 Malignant neoplasm of unspecified part of unspecified bronchus or lung: Secondary | ICD-10-CM

## 2011-05-12 NOTE — Letter (Signed)
May 11, 2011  Lajuana Matte, M.D. [address] [place], [place] [address]  Re:  Todd Villanueva, Todd Villanueva               DOB:  Jul 03, 1941  Dear Arbutus Ped,  I saw the patient back in the office today.  He is doing well.  His PET scan showed a marked decrease in the right lower lobe mass with standard uptake to have decreased from 8 to 4.  There is no evidence any distal spread, so I think he is a candidate for a right lower lobectomy, possible bilobectomy.  I have explained this to him and we have tentatively scheduled this for the 26th at [facility].  I appreciate the opportunity of seeing the patient.  Ines Bloomer, M.D. Electronically Signed  DPB/MEDQ  D:  05/11/2011  T:  05/12/2011  Job:  [id number]

## 2011-05-26 ENCOUNTER — Ambulatory Visit
Admission: RE | Admit: 2011-05-26 | Discharge: 2011-05-26 | Disposition: A | Discharge status: Home / Self Care | Payer: 59 | Source: Ambulatory Visit | Attending: Family Medicine | Admitting: Family Medicine

## 2011-05-26 ENCOUNTER — Other Ambulatory Visit: Payer: Self-pay | Admitting: Family Medicine

## 2011-05-26 ENCOUNTER — Encounter: Payer: Self-pay | Admitting: Family Medicine

## 2011-05-26 ENCOUNTER — Inpatient Hospital Stay (INDEPENDENT_AMBULATORY_CARE_PROVIDER_SITE_OTHER)
Admission: RE | Admit: 2011-05-26 | Discharge: 2011-05-26 | Disposition: A | Discharge status: Home / Self Care | Payer: 59 | Source: Ambulatory Visit | Attending: Family Medicine | Admitting: Family Medicine

## 2011-05-26 DIAGNOSIS — R0781 Pleurodynia: Secondary | ICD-10-CM

## 2011-05-26 DIAGNOSIS — R079 Chest pain, unspecified: Secondary | ICD-10-CM

## 2011-05-26 DIAGNOSIS — T1490XA Injury, unspecified, initial encounter: Secondary | ICD-10-CM

## 2011-05-26 DIAGNOSIS — S20219A Contusion of unspecified front wall of thorax, initial encounter: Secondary | ICD-10-CM

## 2011-05-26 DIAGNOSIS — R0789 Other chest pain: Secondary | ICD-10-CM

## 2011-05-27 ENCOUNTER — Telehealth (INDEPENDENT_AMBULATORY_CARE_PROVIDER_SITE_OTHER): Payer: Self-pay | Admitting: *Deleted

## 2011-05-31 ENCOUNTER — Other Ambulatory Visit (HOSPITAL_COMMUNITY): Payer: 59

## 2011-06-01 ENCOUNTER — Ambulatory Visit (HOSPITAL_COMMUNITY)
Admission: RE | Admit: 2011-06-01 | Discharge: 2011-06-01 | Disposition: A | Discharge status: Home / Self Care | Payer: 59 | Source: Ambulatory Visit | Attending: Thoracic Surgery | Admitting: Thoracic Surgery

## 2011-06-01 ENCOUNTER — Other Ambulatory Visit: Payer: Self-pay | Admitting: Thoracic Surgery

## 2011-06-01 ENCOUNTER — Ambulatory Visit (INDEPENDENT_AMBULATORY_CARE_PROVIDER_SITE_OTHER): Payer: 59 | Admitting: Thoracic Surgery

## 2011-06-01 DIAGNOSIS — C3430 Malignant neoplasm of lower lobe, unspecified bronchus or lung: Secondary | ICD-10-CM

## 2011-06-01 DIAGNOSIS — R52 Pain, unspecified: Secondary | ICD-10-CM

## 2011-06-01 DIAGNOSIS — J438 Other emphysema: Secondary | ICD-10-CM | POA: Insufficient documentation

## 2011-06-01 DIAGNOSIS — R0789 Other chest pain: Secondary | ICD-10-CM | POA: Insufficient documentation

## 2011-06-02 NOTE — Assessment & Plan Note (Signed)
OFFICE VISIT  SAYRE, WITHERINGTON DOB:  1941/08/06                                        June 01, 2011 CHART #:  [id number]  The patient came today.  He fell and hit left chest at the anterior axillary line at the fifth intercostal space and he has some bruising with coughing.  His chest x-rays today and 3 days ago were negative for fracture and he has gradually improved with ibuprofen 800 twice a day. I will continue to watch him and I have rescheduled his surgery for July 6 at [facility].  Ines Bloomer, M.D. Electronically Signed  DPB/MEDQ  D:  06/01/2011  T:  06/02/2011  Job:  [id number]

## 2011-06-03 ENCOUNTER — Telehealth: Payer: Self-pay

## 2011-06-03 NOTE — Telephone Encounter (Signed)
New RX called to  out pt pharm IBU 800 mg po bid #40/0

## 2011-06-07 ENCOUNTER — Encounter (HOSPITAL_COMMUNITY)
Admission: RE | Admit: 2011-06-07 | Discharge: 2011-06-07 | Disposition: A | Discharge status: Home / Self Care | Payer: 59 | Source: Ambulatory Visit | Attending: Thoracic Surgery | Admitting: Thoracic Surgery

## 2011-06-07 ENCOUNTER — Other Ambulatory Visit: Payer: Self-pay | Admitting: Thoracic Surgery

## 2011-06-07 ENCOUNTER — Ambulatory Visit (HOSPITAL_COMMUNITY)
Admission: RE | Admit: 2011-06-07 | Discharge: 2011-06-07 | Disposition: A | Discharge status: Home / Self Care | Payer: 59 | Source: Ambulatory Visit | Attending: Thoracic Surgery | Admitting: Thoracic Surgery

## 2011-06-07 DIAGNOSIS — I10 Essential (primary) hypertension: Secondary | ICD-10-CM | POA: Insufficient documentation

## 2011-06-07 DIAGNOSIS — Z01812 Encounter for preprocedural laboratory examination: Secondary | ICD-10-CM | POA: Insufficient documentation

## 2011-06-07 DIAGNOSIS — J984 Other disorders of lung: Secondary | ICD-10-CM | POA: Insufficient documentation

## 2011-06-07 DIAGNOSIS — C3490 Malignant neoplasm of unspecified part of unspecified bronchus or lung: Secondary | ICD-10-CM

## 2011-06-07 DIAGNOSIS — Z01818 Encounter for other preprocedural examination: Secondary | ICD-10-CM | POA: Insufficient documentation

## 2011-06-07 DIAGNOSIS — Z8611 Personal history of tuberculosis: Secondary | ICD-10-CM | POA: Insufficient documentation

## 2011-06-07 DIAGNOSIS — J449 Chronic obstructive pulmonary disease, unspecified: Secondary | ICD-10-CM | POA: Insufficient documentation

## 2011-06-07 DIAGNOSIS — J4489 Other specified chronic obstructive pulmonary disease: Secondary | ICD-10-CM | POA: Insufficient documentation

## 2011-06-07 DIAGNOSIS — I77810 Thoracic aortic ectasia: Secondary | ICD-10-CM | POA: Insufficient documentation

## 2011-06-07 DIAGNOSIS — I2510 Atherosclerotic heart disease of native coronary artery without angina pectoris: Secondary | ICD-10-CM | POA: Insufficient documentation

## 2011-06-07 DIAGNOSIS — Z01810 Encounter for preprocedural cardiovascular examination: Secondary | ICD-10-CM | POA: Insufficient documentation

## 2011-06-07 LAB — URINALYSIS, ROUTINE W REFLEX MICROSCOPIC
Glucose, UA: NEGATIVE mg/dL
Leukocytes, UA: NEGATIVE
Nitrite: NEGATIVE
Specific Gravity, Urine: 1.019 (ref 1.005–1.030)
pH: 5 (ref 5.0–8.0)

## 2011-06-07 LAB — CBC
Hemoglobin: 9.8 g/dL — ABNORMAL LOW (ref 13.0–17.0)
Platelets: 170 10*3/uL (ref 150–400)
RBC: 2.99 MIL/uL — ABNORMAL LOW (ref 4.22–5.81)
WBC: 7.5 10*3/uL (ref 4.0–10.5)

## 2011-06-07 LAB — COMPREHENSIVE METABOLIC PANEL
ALT: 26 U/L (ref 0–53)
Albumin: 3.4 g/dL — ABNORMAL LOW (ref 3.5–5.2)
Alkaline Phosphatase: 358 U/L — ABNORMAL HIGH (ref 39–117)
BUN: 25 mg/dL — ABNORMAL HIGH (ref 6–23)
Calcium: 9.6 mg/dL (ref 8.4–10.5)
GFR calc Af Amer: >60 mL/min (ref >60)
Glucose, Bld: 70 mg/dL (ref 70–99)
Potassium: 4.4 mEq/L (ref 3.5–5.1)
Sodium: 139 mEq/L (ref 135–145)
Total Protein: 6.6 g/dL (ref 6.0–8.3)

## 2011-06-07 LAB — BLOOD GAS, ARTERIAL
Acid-base deficit: 3.5 mmol/L — ABNORMAL HIGH (ref 0.0–2.0)
Drawn by: 206361
FIO2: 0.21 %
pCO2 arterial: 34.7 mmHg — ABNORMAL LOW (ref 35.0–45.0)
pO2, Arterial: 92.5 mmHg (ref 80.0–100.0)

## 2011-06-07 LAB — APTT: aPTT: 27 seconds (ref 24–37)

## 2011-06-07 LAB — ABO/RH: ABO/RH(D): O POS

## 2011-06-07 LAB — PROTIME-INR: Prothrombin Time: 12.2 seconds (ref 11.6–15.2)

## 2011-06-10 ENCOUNTER — Inpatient Hospital Stay (HOSPITAL_COMMUNITY): Payer: 59

## 2011-06-10 ENCOUNTER — Inpatient Hospital Stay (HOSPITAL_COMMUNITY)
Admission: RE | Admit: 2011-06-10 | Discharge: 2011-06-15 | DRG: 164 | Disposition: A | Discharge status: Home / Self Care | Payer: 59 | Source: Ambulatory Visit | Attending: Thoracic Surgery | Admitting: Thoracic Surgery

## 2011-06-10 ENCOUNTER — Other Ambulatory Visit: Payer: Self-pay | Admitting: Thoracic Surgery

## 2011-06-10 DIAGNOSIS — C3430 Malignant neoplasm of lower lobe, unspecified bronchus or lung: Principal | ICD-10-CM | POA: Diagnosis present

## 2011-06-10 DIAGNOSIS — E785 Hyperlipidemia, unspecified: Secondary | ICD-10-CM | POA: Diagnosis present

## 2011-06-10 DIAGNOSIS — Z87891 Personal history of nicotine dependence: Secondary | ICD-10-CM

## 2011-06-10 DIAGNOSIS — Z01812 Encounter for preprocedural laboratory examination: Secondary | ICD-10-CM

## 2011-06-10 DIAGNOSIS — I2510 Atherosclerotic heart disease of native coronary artery without angina pectoris: Secondary | ICD-10-CM | POA: Diagnosis present

## 2011-06-10 DIAGNOSIS — Z01810 Encounter for preprocedural cardiovascular examination: Secondary | ICD-10-CM

## 2011-06-10 DIAGNOSIS — R443 Hallucinations, unspecified: Secondary | ICD-10-CM | POA: Diagnosis not present

## 2011-06-10 DIAGNOSIS — Z01818 Encounter for other preprocedural examination: Secondary | ICD-10-CM

## 2011-06-10 DIAGNOSIS — J449 Chronic obstructive pulmonary disease, unspecified: Secondary | ICD-10-CM | POA: Diagnosis present

## 2011-06-10 DIAGNOSIS — J4489 Other specified chronic obstructive pulmonary disease: Secondary | ICD-10-CM | POA: Diagnosis present

## 2011-06-10 DIAGNOSIS — K219 Gastro-esophageal reflux disease without esophagitis: Secondary | ICD-10-CM | POA: Diagnosis present

## 2011-06-10 DIAGNOSIS — D62 Acute posthemorrhagic anemia: Secondary | ICD-10-CM | POA: Diagnosis not present

## 2011-06-10 HISTORY — PX: OTHER SURGICAL HISTORY: SHX169

## 2011-06-11 ENCOUNTER — Inpatient Hospital Stay (HOSPITAL_COMMUNITY): Payer: 59

## 2011-06-11 LAB — BASIC METABOLIC PANEL
BUN: 25 mg/dL — ABNORMAL HIGH (ref 6–23)
CO2: 19 mEq/L (ref 19–32)
Calcium: 8.4 mg/dL (ref 8.4–10.5)
Creatinine, Ser: 1.26 mg/dL (ref 0.50–1.35)
Glucose, Bld: 171 mg/dL — ABNORMAL HIGH (ref 70–99)

## 2011-06-11 LAB — TYPE AND SCREEN
ABO/RH(D): O POS
Antibody Screen: NEGATIVE
Unit division: 0

## 2011-06-11 LAB — POCT I-STAT 3, ART BLOOD GAS (G3+)
Acid-base deficit: 8 mmol/L — ABNORMAL HIGH (ref 0.0–2.0)
Bicarbonate: 18.5 mEq/L — ABNORMAL LOW (ref 20.0–24.0)
O2 Saturation: 94 %
Patient temperature: 98.2
TCO2: 20 mmol/L (ref 0–100)

## 2011-06-11 LAB — CBC
MCH: 32.4 pg (ref 26.0–34.0)
MCHC: 32.7 g/dL (ref 30.0–36.0)
MCV: 98.9 fL (ref 78.0–100.0)
Platelets: 158 10*3/uL (ref 150–400)
RBC: 2.72 MIL/uL — ABNORMAL LOW (ref 4.22–5.81)
RDW: 14.8 % (ref 11.5–15.5)

## 2011-06-11 LAB — GLUCOSE, CAPILLARY: Glucose-Capillary: 136 mg/dL — ABNORMAL HIGH (ref 70–99)

## 2011-06-12 ENCOUNTER — Inpatient Hospital Stay (HOSPITAL_COMMUNITY): Payer: 59

## 2011-06-12 LAB — GLUCOSE, CAPILLARY
Glucose-Capillary: 126 mg/dL — ABNORMAL HIGH (ref 70–99)
Glucose-Capillary: 145 mg/dL — ABNORMAL HIGH (ref 70–99)

## 2011-06-12 LAB — COMPREHENSIVE METABOLIC PANEL
ALT: 15 U/L (ref 0–53)
AST: 24 U/L (ref 0–37)
CO2: 23 mEq/L (ref 19–32)
Calcium: 8.8 mg/dL (ref 8.4–10.5)
Sodium: 129 mEq/L — ABNORMAL LOW (ref 135–145)
Total Protein: 6 g/dL (ref 6.0–8.3)

## 2011-06-12 LAB — CBC
MCH: 32.6 pg (ref 26.0–34.0)
Platelets: 145 10*3/uL — ABNORMAL LOW (ref 150–400)
RBC: 2.61 MIL/uL — ABNORMAL LOW (ref 4.22–5.81)

## 2011-06-13 ENCOUNTER — Inpatient Hospital Stay (HOSPITAL_COMMUNITY): Payer: 59

## 2011-06-13 LAB — GLUCOSE, CAPILLARY: Glucose-Capillary: 137 mg/dL — ABNORMAL HIGH (ref 70–99)

## 2011-06-13 LAB — BASIC METABOLIC PANEL
GFR calc Af Amer: >60 mL/min (ref >60)
GFR calc non Af Amer: >60 mL/min (ref >60)
Potassium: 4.7 mEq/L (ref 3.5–5.1)
Sodium: 132 mEq/L — ABNORMAL LOW (ref 135–145)

## 2011-06-13 LAB — CBC
MCHC: 33.3 g/dL (ref 30.0–36.0)
Platelets: 128 10*3/uL — ABNORMAL LOW (ref 150–400)
RDW: 14.1 % (ref 11.5–15.5)

## 2011-06-13 NOTE — Op Note (Signed)
NAMEAMERE, BRICCO NO.:  [id number]  MEDICAL RECORD NO.:  [id number]  LOCATION:  2305                         FACILITY:  [facility]  PHYSICIAN:  Ines Bloomer, M.D. DATE OF BIRTH:  [birth year]  DATE OF PROCEDURE: DATE OF DISCHARGE:                              OPERATIVE REPORT   PREOPERATIVE DIAGNOSIS:  Non-small cell lung cancer, right lower lobe stage IIIA, status post radiation and chemotherapy.  POSTOPERATIVE DIAGNOSIS:  Non-small cell lung cancer, right lower lobe stage IIIA, status post radiation and chemotherapy.  OPERATION PERFORMED:  Right VATS, right thoracotomy, right middle and right lower lobectomy.  SURGEON:  Ines Bloomer, MD.  FIRST ASSISTANT:  Evelene Croon, MD with Coral Ceo, PA-C.  ANESTHESIA:  General anesthesia.  This patient was found to have a stage IIIA non-small cell lung cancer, probable squamous and now underwent radiation chemotherapy and had a good response and was above.  PET scan revealed minimal activity with no minimal activity and was thought that he could tolerate a bilobectomy to remove the right lower lobe tumor.  After general anesthesia, he was turned to the right lateral thoracotomy position and he was prepped and draped in usual sterile manner.  A dual-lumen tube was inserted.  The right lung was deflated.  Two trocar sites were made in the anterior and posterior axillary line at the seventh intercostal space.  An incision was made over the triangle auscultation partially dividing latissimus and reflecting the serratus anteriorly and then entering fifth intercostal space.  Before we entered the fifth intercostal space, we took down the intercostal muscle with electrocautery and taken off the sixth rib inferiorly and the fifth rib superiorly and then kept the muscle without dividing it with 2-0 silk superiorly.  TPA was inserted into the space and then dissection was carried out.  The patient had inferior  pulmonary ligaments taken out with electrocautery and then we dissected up around the inferior pulmonary vein which was somewhat scarred in from the radiation, but we were able to dissected out and looped with a vascular tape.  We then did this complete subcarinal node dissection dissecting out at least 5-7 subcarinal node.  We sent the specimen for frozen section and it was negative.  We then turned our attention to the fissure and started dissecting out the fissure, we first dissected out the superior portion of the fissure and divided that with an Covidien purple stapler.  There was a lot of scarring there and there was a left lymph node that was stuck to the basilar branch of the pulmonary artery and the takeoff of the lateral branch of the right middle lobe and this was probably look like it is involved with cancer, in order to get a resection of this, we felt would have to do a bilobectomy and we felt we have to do at the time of prior to surgery. We proceeded with partially dividing the minor fissure with the Covidien stapler and this allowed [place] to dissect out the pulmonary artery and we looped the pulmonary artery distal to the takeoff of the posterior descending and the medial branch including the middle lobe.  We looped that with a  vascular tape.  Then dissected out the middle vein branch to the middle lobe and divided up with a Covidien gray stapler and then divided the inferior pulmonary vein with Covidien stapler.  Next, we divided the pulmonary artery with a Covidien hook stapler and then ligated the medial branch to the right middle lobe with 0 silk and clipped it distally and divided it.  This was able then to free up the rest of the minor fissure and which we divided that with the Covidien stapler.  Finally, we dissected out the bronchus intermedius, clamped it with a TA-30 stapler, checked expanded the right upper lobe and expanded without difficulty and then stapled the  TA-30 and divided it distally with knife.  The right middle lobe and right lower lobe was sent for frozen sections and the bronchial margin was negative and the tumor was necrotic.  We then divided proximally the intercostal muscle flap and dropped it on the bronchus and sutured in place with 2-0 silk.  ProGel was applied to the staple line and right angle and a straight chest tube were inserted.  To the trocar sites, a single On-Q was inserted in the usual fashion, a Marcaine block was done in the usual fashion.  Chest was closed with 4 pericostal drilling through the sixth rib and passed around the fifth rib, #1 Vicryl in the muscle layer and 2-0 Vicryl subcutaneous stitch.  The patient returned to recovery room in stable condition.     Ines Bloomer, M.D.     DPB/MEDQ  D:  06/10/2011  T:  06/10/2011  Job:  [id number]  cc:   Lajuana Matte, M.D.  Electronically Signed by Jovita Gamma M.D. on 06/13/2011 01:56:05 PM

## 2011-06-14 ENCOUNTER — Inpatient Hospital Stay (HOSPITAL_COMMUNITY): Payer: 59

## 2011-06-14 LAB — GLUCOSE, CAPILLARY
Glucose-Capillary: 105 mg/dL — ABNORMAL HIGH (ref 70–99)
Glucose-Capillary: 112 mg/dL — ABNORMAL HIGH (ref 70–99)
Glucose-Capillary: 118 mg/dL — ABNORMAL HIGH (ref 70–99)

## 2011-06-15 ENCOUNTER — Inpatient Hospital Stay (HOSPITAL_COMMUNITY): Payer: 59

## 2011-06-15 LAB — BASIC METABOLIC PANEL
BUN: 20 mg/dL (ref 6–23)
Calcium: 9 mg/dL (ref 8.4–10.5)
Creatinine, Ser: 0.97 mg/dL (ref 0.50–1.35)
GFR calc non Af Amer: >60 mL/min (ref >60)
Glucose, Bld: 99 mg/dL (ref 70–99)
Sodium: 133 mEq/L — ABNORMAL LOW (ref 135–145)

## 2011-06-15 LAB — CBC
Hemoglobin: 8.2 g/dL — ABNORMAL LOW (ref 13.0–17.0)
MCH: 32.5 pg (ref 26.0–34.0)
MCHC: 33.6 g/dL (ref 30.0–36.0)
MCV: 96.8 fL (ref 78.0–100.0)

## 2011-06-15 LAB — GLUCOSE, CAPILLARY

## 2011-06-20 NOTE — Discharge Summary (Signed)
  NAMEQUAMEL, FITZMAURICE NO.:  [id number]  MEDICAL RECORD NO.:  [id number]  LOCATION:  3310                         FACILITY:  [facility]  PHYSICIAN:  Ines Bloomer, M.D. DATE OF BIRTH:  26-Mar-1941  DATE OF ADMISSION:  06/10/2011 DATE OF DISCHARGE:  06/15/2011                              DISCHARGE SUMMARY   ADDENDUM  BRIEF HOSPITAL COURSE STAY SINCE LAST DICTATION:  The patient had complaints of not being able to sleep and being anxious.  He was given Xanax, which he said helped him greatly.  He is requesting to have a prescription for this upon discharge.  He remains afebrile, vital signs stable, O2 sat was 94% on room air.  Chest x-ray done today showed a stable small right apical pneumothorax and small right pleural effusion with atelectasis.  BMET done showed sodium 133, potassium 4.1, BUN and creatinine 20 and 0.97 respectively.  CBC:  H and H 8.2 and 24.4, white count of 7200, platelet count 159,000.  There is no change on his physical exam and he was AAO x3 and he has had a bowel movement.  Dr. Edwyna Shell has seen and evaluated the patient and feels he is surgically stable for discharge today.  The only change to his medications include the addition of Xanax 0.25 mg p.o. b.i.d. p.r.n. for anxiety.  Remaining medications as previously dictated.  There have been no changes made to his followup appointment or discharge instructions.     Doree Fudge, PA   ______________________________ Ines Bloomer, M.D.    DZ/MEDQ  D:  06/15/2011  T:  06/15/2011  Job:  [id number]  cc:   Lajuana Matte, M.D. Pearla Dubonnet, M.D. Jake Bathe, MD  Electronically Signed by Doree Fudge PA on 06/16/2011 11:56:33 AM Electronically Signed by Jovita Gamma M.D. on 06/20/2011 04:03:31 PM

## 2011-06-20 NOTE — H&P (Signed)
  Todd Villanueva, CREGGER NO.:  [id number]  MEDICAL RECORD NO.:  [id number]  LOCATION:                                 FACILITY:  PHYSICIAN:  Ines Bloomer, M.D. DATE OF BIRTH:  1941/03/12  DATE OF ADMISSION:  06/10/2011 DATE OF DISCHARGE:                             HISTORY & PHYSICAL   CHIEF COMPLAINT:  Right lower lobe mass.  HISTORY OF PRESENT ILLNESS:  This is a 70 year old Caucasian male, was found to have right lower lobe mass, underwent a biopsy and was thought to be a stage IIIA non-small cell lung cancer.  He underwent chemotherapy with a marked decrease in the mass with a standard uptake value gone from 8 to 4.  His performance status is good.  He also received radiation at the same time.  His pulmonary function tests showed an FVC of 3.57 with an FEV was 96% predicted with an FEV-1 of 2.38 or 87% predicted and a diffusion capacity of 51%.  He has quit smoking although he did smoke in the past.  He has had no fever, chills, or excessive sputum.  No hemoptysis.  He recently had fell and hurt his left ribs, but scans were negative, this has gradually decreased the pain and we had him on ibuprofen.  MEDICATIONS:  Include, simvastatin, lisinopril, citalopram, omeprazole, and ibuprofen.  ALLERGIES:  He is allergic to VICODIN and OXYCODONE that cause itching.  PAST MEDICAL HISTORY:  He has got hypertension and hyperlipidemia.  FAMILY HISTORY:  Noncontributory.  REVIEW OF SYSTEMS:  He lost some weight with chemotherapy.  CARDIAC:  No angina or atrial fibrillation.  PULMONARY:  See history of present illness.  GI:  No nausea, vomiting, constipation, or diarrhea.  GU:  No kidney disease, dysuria, or frequent urination.  VASCULAR:  No claudication, DVT, TIAs.  NEUROLOGIC:  Headaches.  MUSCULOSKELETAL: Arthritis.  PSYCHIATRIC:  Some depression.  EYE/ENT:  Some decrease in blurring of vision now and then.  HEMATOLOGIC:  No problems with bleeding,  clotting disorders, or anemia.  PHYSICAL EXAMINATION:  GENERAL:  He is a well-developed Caucasian male, in no acute distress. VITAL SIGNS:  His blood pressure was 100/63, pulse 76, respirations 20, sats were 97%. HEAD:  Atraumatic. EYES:  Pupils are equal, round, reactive to light, and accommodation. Extraocular movements normal. EARS:  Tympanic membranes intact. NOSE:  There is no septal deviation. NECK:  Supple without thyromegaly.  There is no supraclavicular or axillary adenopathy. CHEST:  Clear to auscultation and percussion. HEART:  Regular sinus rhythm.  No murmurs. ABDOMEN:  Soft.  There is no hepatosplenomegaly. EXTREMITIES: Pulses are 2+.  There is no clubbing or edema. NEUROLOGIC:  He is oriented x3.  Sensory and motor intact.  Cranial nerves intact.  IMPRESSION: 1. Stage IIIA non-small cell lung cancer, status post radiation and     chemotherapy. 2. History of tobacco abuse. 3. Hypertension. 4. Dyslipidemia.  PLAN:  Middle and right lower lobectomy.     Ines Bloomer, M.D.     DPB/MEDQ  D:  06/07/2011  T:  06/08/2011  Job:  [id number]  Electronically Signed by Jovita Gamma M.D. on 06/20/2011 04:03:33 PM

## 2011-06-21 ENCOUNTER — Other Ambulatory Visit: Payer: Self-pay | Admitting: Thoracic Surgery

## 2011-06-21 DIAGNOSIS — C3430 Malignant neoplasm of lower lobe, unspecified bronchus or lung: Secondary | ICD-10-CM

## 2011-06-22 ENCOUNTER — Ambulatory Visit
Admission: RE | Admit: 2011-06-22 | Discharge: 2011-06-22 | Disposition: A | Discharge status: Home / Self Care | Payer: 59 | Source: Ambulatory Visit | Attending: Thoracic Surgery | Admitting: Thoracic Surgery

## 2011-06-22 ENCOUNTER — Ambulatory Visit (INDEPENDENT_AMBULATORY_CARE_PROVIDER_SITE_OTHER): Payer: Self-pay | Admitting: Thoracic Surgery

## 2011-06-22 DIAGNOSIS — C3490 Malignant neoplasm of unspecified part of unspecified bronchus or lung: Secondary | ICD-10-CM

## 2011-06-22 DIAGNOSIS — C3430 Malignant neoplasm of lower lobe, unspecified bronchus or lung: Secondary | ICD-10-CM

## 2011-06-23 NOTE — Letter (Signed)
June 22, 2011    Re:  Todd Villanueva, Todd Villanueva               DOB:  1941-04-24    The patient came to Dr. Robley Fries.  The patient came today for followup.  His blood pressure is 106/72, pulse 100, respirations 20, sats were 96%.  He is still having some pain and had some ibuprofen and hydrocodone.  He is complaining of dyspnea and I told him that that was normally to be expected since he had a bilobectomy.  His chest x-ray showed normal postoperative changes.  I removed his chest tube sutures and is healing well.  His lungs are clear bilaterally.  I will see him back again in 2 weeks with a chest x-ray.  Ines Bloomer, M.D. Electronically Signed  DPB/MEDQ  D:  06/22/2011  T:  06/23/2011  Job:  [id number]  cc:   Pearla Dubonnet, M.D. Coralyn Helling, MD Lajuana Matte, M.D.

## 2011-06-27 NOTE — Discharge Summary (Signed)
NAMESTROTHER, EVERITT NO.:  [id number]  MEDICAL RECORD NO.:  [id number]  LOCATION:  3310                         FACILITY:  [facility]  PHYSICIAN:  Doree Fudge, PA DATE OF BIRTH:  May 14, 1941  DATE OF ADMISSION:  06/10/2011 DATE OF DISCHARGE:  06/15/2011                              DISCHARGE SUMMARY   ADMITTING DIAGNOSES: 1. Stage IIIA non-small cell right lung cancer (status post     chemotherapy and radiation). 2. History of hypertension. 3. History of hyperlipidemia. 4. History of tobacco abuse. 5. History of gastroesophageal reflux disease. 6. History of coronary artery disease.  DISCHARGE DIAGNOSES: 1. Stage IIIA non-small cell right lung cancer (status post     chemotherapy and radiation). 2. History of hypertension. 3. History of hyperlipidemia. 4. History of tobacco abuse. 5. History of gastroesophageal reflux disease. 6. History of coronary artery disease. 7. Acute blood loss anemia.  POSTOPERATIVE DIAGNOSES:  Thrombocytopenia.  PROCEDURE:  Right VATS, right thoracotomy, right middle and lower lobectomies with lymph node dissection by Dr. Edwyna Shell on June 10, 2011.  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  HISTORY OF PRESENT ILLNESS:  This is a 70 year old Caucasian male who was first seen and evaluated in the office by Dr. Edwyna Shell on May 04, 2011, regarding the right non-small cell lung cancer.  According to medical records, the patient had undergone 6 chemotherapy treatments as well as 6 weeks of radiation and completed this on Apr 22, 2011.  He was then found to have a marked decrease in reduction of the mass in the right lower lobe as well as some reduction in his right peribronchial area.  He then underwent a PET scan on May 10, 2011.  The right lower lobe mass was 2.8 x 2.9 cm with an SUV max of 2.7  (decreased in size since Apr 08, 2011 as well as decrease in SUV max from previously 20). He was also found to have decreased hypermetabolism within the right infrahilar region.  Hypermetabolic soft tissue density measured 2.1 x 2.6 cm with an SUV max of 3.9 (this has decreased from the previous SUV max of 8.9), no abnormal activity within the abdomen or pelvis and a new right upper lobe/fissural hypermetabolism which is favored to be other information or infection.  Pulmonary function test showed an FVC of 3.57 with an FEV of 96% predicted and FEV-1 at 2.38 or 87% of predicted, and a diffusion capacity of 51%.  Dr. Edwyna Shell discussed with the patient the necessitation to undergo a right lobectomy.  Potential risks, complications and benefits of the surgery were discussed with the patient and he agreed to proceed.  He was admitted to [facility] on June 10, 2011, in order to undergo the right thoracotomy, right middle and lower lobectomy with lymph node dissection on June 10, 2011.  BRIEF HOSPITAL COURSE STAY:  The patient remained afebrile and hemodynamically stable.  He was not found to have an air leak from his chest tube.  His suction was decreased.  Daily chest x-rays were obtained and remained stable.  His anterior chest  tube was removed on postoperative day #2.  Remaining chest tube was placed to a Mini Express and again had no air leak.  It was then removed on postoperative day #4. The patient's Foley and On-Q were also removed early in his postoperative course.  In addition, the patient had intermittent hallucinations.  As a result, his fentanyl PCA was discontinued.  According to the patient and his wife, he is "very sensitive to narcotics."  As a result, he will be discharged home with Ultram.  It should also be noted, that the patient did state he does drink several times per week.  As a result, he was placed on a multivitamine and thiamine and monitored closely.  He then had  complaints of not being able to sleep and stated that he took Equate PM to help him at home.  His wife was instructed to bring in 1 tablet and so that he may take at this evening.  He is already been tolerating a diet and has not had a bowel movement. As result, we will give him lactulose to help with his constipation.  The patient was found to have acute blood loss anemia postoperatively.  His H and H went as low as 8.3 and 24.9, he was started on Nu-Iron.  Provided his chest x- ray remained stable, he remains afebrile, and pending morning round evaluation, he will be surgically stable for discharge on June 15, 2011.  LATEST LABORATORY STUDIES:  Are as follows:  BMET done on June 13, 2011, potassium 4.7, sodium 132, BUN and creatinine 19 and 0.94 respectively. CBC:  H and H 8.3 and 24.9.  White blood cell count 8500, platelet count of 128,000.  Last chest x-ray done on June 14, 2011, showed a right-sided hydropneumothorax (stable from previous exams and the left lung to be clear).  DISCHARGE INSTRUCTION:  Include the following:  DIET:  Low-sodium heart-healthy.  ACTIVITY:  The patient may walk up steps.  He may shower.  Not to lift more than 10 pounds for 2 weeks, he is not to drive until after 2 weeks. He is to continue with his breathing exercise daily, to walk daily and increase his frequency and duration as tolerates.  WOUND CARE:  He is to use soap and water on his wounds.  He is to contact the office if any wound problems arise.  FOLLOWUP APPOINTMENTS:  Include to see Dr. Edwyna Shell on June 22, 2011, at 3:00 p.m. 45 minutes prior to this office appointment, a chest x-ray will be obtained.  DISCHARGE MEDICATIONS:  Include the following: 1. Nu-Iron 150 mg p.o. daily. 2. Multivitamin p.o. daily. 3. Ultram 50 mg 1-2 tablets q.6 h p.o. p.r.n. pain. 4. Citalopram 20 mg p.o. daily. 5. Omeprazole 20 mg p.o. daily. 6. Simvastatin 40 mg p.o. q.p.m. every Monday, Wednesday and  Friday. 7. Vitamin B12 one tablet p.o. daily.     Doree Fudge, PA     DZ/MEDQ  D:  06/14/2011  T:  06/15/2011  Job:  [id number]  cc:   Lajuana Matte, M.D. Pearla Dubonnet, M.D. Jake Bathe, MD  Electronically Signed by Doree Fudge PA on 06/15/2011 08:24:03 AM Electronically Signed by Jovita Gamma M.D. on 06/27/2011 02:41:43 PM

## 2011-06-28 ENCOUNTER — Other Ambulatory Visit: Payer: Self-pay | Admitting: Thoracic Surgery

## 2011-06-28 ENCOUNTER — Ambulatory Visit
Admission: RE | Admit: 2011-06-28 | Discharge: 2011-06-28 | Disposition: A | Discharge status: Home / Self Care | Payer: 59 | Source: Ambulatory Visit | Attending: Thoracic Surgery | Admitting: Thoracic Surgery

## 2011-06-28 ENCOUNTER — Ambulatory Visit (INDEPENDENT_AMBULATORY_CARE_PROVIDER_SITE_OTHER): Payer: Self-pay | Admitting: Thoracic Surgery

## 2011-06-28 DIAGNOSIS — C3490 Malignant neoplasm of unspecified part of unspecified bronchus or lung: Secondary | ICD-10-CM

## 2011-06-29 NOTE — Assessment & Plan Note (Signed)
OFFICE VISIT  DREYDON, CARDENAS DOB:  February 21, 1941                                        June 28, 2011 CHART #:  [id number]  The patient came today complaining of not being able to sleep, nausea and some feeling cold and clammy.  His blood pressure is 147/99, pulse 100, respirations 24, sats were 98%.  Temperature was 97.6.  We ordered a CBC and a CMP on him and his chest x-ray showed no real change.  You can see the fluid where we did a bilobectomy.  His incision is well healed.  Abdomen was soft with good bowel sounds.  I gave him a prescription for Phenergan 25 mg to take 1 every 8 hours and I will see him back again in a week and we will call if we see anything on his laboratory.  He has stopped his Nu-Iron and is out of his Zantac.  Ines Bloomer, M.D. Electronically Signed  DPB/MEDQ  D:  06/28/2011  T:  06/28/2011  Job:  [id number]

## 2011-07-05 ENCOUNTER — Other Ambulatory Visit: Payer: Self-pay | Admitting: Thoracic Surgery

## 2011-07-05 DIAGNOSIS — C3430 Malignant neoplasm of lower lobe, unspecified bronchus or lung: Secondary | ICD-10-CM

## 2011-07-06 ENCOUNTER — Ambulatory Visit
Admission: RE | Admit: 2011-07-06 | Discharge: 2011-07-06 | Disposition: A | Discharge status: Home / Self Care | Payer: 59 | Source: Ambulatory Visit | Attending: Thoracic Surgery | Admitting: Thoracic Surgery

## 2011-07-06 ENCOUNTER — Ambulatory Visit (INDEPENDENT_AMBULATORY_CARE_PROVIDER_SITE_OTHER): Payer: Self-pay | Admitting: Thoracic Surgery

## 2011-07-06 DIAGNOSIS — C3430 Malignant neoplasm of lower lobe, unspecified bronchus or lung: Secondary | ICD-10-CM

## 2011-07-06 DIAGNOSIS — C3490 Malignant neoplasm of unspecified part of unspecified bronchus or lung: Secondary | ICD-10-CM

## 2011-07-06 NOTE — Assessment & Plan Note (Signed)
OFFICE VISIT  MORY, HERRMAN DOB:  12-23-1940                                        July 06, 2011 CHART #:  [id number]  The patient came today and is feeling much better.  His blood pressure is 114/80, pulse 100, respirations 20, sats were 98%.  We released him to start driving and go back to work in 2 weeks and he is doing well overall.  We will see him back again in 4 weeks with a chest x-ray.  His chest x-ray today was stable, showed the normal changes of a bilobectomy.  Ines Bloomer, M.D. Electronically Signed  DPB/MEDQ  D:  07/06/2011  T:  07/06/2011  Job:  [id number]

## 2011-08-02 ENCOUNTER — Other Ambulatory Visit: Payer: Self-pay | Admitting: Thoracic Surgery

## 2011-08-02 DIAGNOSIS — C3430 Malignant neoplasm of lower lobe, unspecified bronchus or lung: Secondary | ICD-10-CM

## 2011-08-03 ENCOUNTER — Ambulatory Visit: Payer: Self-pay | Admitting: Thoracic Surgery

## 2011-08-03 DIAGNOSIS — R222 Localized swelling, mass and lump, trunk: Secondary | ICD-10-CM

## 2011-08-03 DIAGNOSIS — C3490 Malignant neoplasm of unspecified part of unspecified bronchus or lung: Secondary | ICD-10-CM

## 2011-08-03 DIAGNOSIS — E785 Hyperlipidemia, unspecified: Secondary | ICD-10-CM

## 2011-08-04 ENCOUNTER — Encounter: Payer: Self-pay | Admitting: Thoracic Surgery

## 2011-08-04 ENCOUNTER — Ambulatory Visit (INDEPENDENT_AMBULATORY_CARE_PROVIDER_SITE_OTHER): Payer: Self-pay | Admitting: Thoracic Surgery

## 2011-08-04 ENCOUNTER — Ambulatory Visit
Admission: RE | Admit: 2011-08-04 | Discharge: 2011-08-04 | Disposition: A | Discharge status: Home / Self Care | Payer: 59 | Source: Ambulatory Visit | Attending: Thoracic Surgery | Admitting: Thoracic Surgery

## 2011-08-04 VITALS — BP 97/64 | HR 104 | Resp 20 | Ht 67.0 in | Wt 158.0 lb

## 2011-08-04 DIAGNOSIS — C3430 Malignant neoplasm of lower lobe, unspecified bronchus or lung: Secondary | ICD-10-CM

## 2011-08-04 DIAGNOSIS — Z9889 Other specified postprocedural states: Secondary | ICD-10-CM

## 2011-08-04 DIAGNOSIS — Z902 Acquired absence of lung [part of]: Secondary | ICD-10-CM

## 2011-08-04 DIAGNOSIS — C3490 Malignant neoplasm of unspecified part of unspecified bronchus or lung: Secondary | ICD-10-CM

## 2011-08-04 MED ORDER — ALPRAZOLAM 0.25 MG PO TABS: 0.2500 mg | ORAL_TABLET | Freq: Every evening | ORAL | Status: DC | PRN

## 2011-08-04 NOTE — Progress Notes (Signed)
HPI the patient has returned to work. He has stopped taking his iron. He gave him a refill for Xanax 0.25 mg. His incision is well-healed we will refer him back to Dr. Gwenyth Bouillon for followup. Overall he is doing well. I will see him again in 6 weeks.   Current Outpatient Prescriptions  Medication Sig Dispense Refill  . ALPRAZolam (XANAX) 0.25 MG tablet Take 1 tablet (0.25 mg total) by mouth at bedtime as needed.  30 tablet  2  . citalopram (CELEXA) 20 MG tablet Take 20 mg by mouth daily.        Marland Kitchen lisinopril (PRINIVIL,ZESTRIL) 40 MG tablet Take 40 mg by mouth daily.        Marland Kitchen omeprazole (PRILOSEC) 20 MG capsule Take 20 mg by mouth daily.        . simvastatin (ZOCOR) 40 MG tablet Take 40 mg by mouth at bedtime. Takes 40mg  Mon/ Wed/Fridays only       . traMADol (ULTRAM) 50 MG tablet Take 50 mg by mouth every 6 (six) hours as needed.        . polysaccharide iron (NIFEREX) 150 MG CAPS capsule Take 150 mg by mouth daily.        . promethazine (PHENERGAN) 25 MG tablet Take 25 mg by mouth every 6 (six) hours as needed.            Physical Exam  Cardiovascular: Normal rate, regular rhythm and normal heart sounds.   Pulmonary/Chest: Effort normal and breath sounds normal.   his incision is well healed on the right.   Diagnostic tests: Chest x-ray shows normal postoperative changes for a bilobectomy.  Impression: Status post bilobectomy for stage IIIA non-small cell lung cancer.   Plan: Follow up with Dr. Gwenyth Bouillon. Followup in 6 weeks.

## 2011-09-16 ENCOUNTER — Other Ambulatory Visit: Payer: Self-pay | Admitting: Thoracic Surgery

## 2011-09-16 DIAGNOSIS — C3490 Malignant neoplasm of unspecified part of unspecified bronchus or lung: Secondary | ICD-10-CM

## 2011-09-19 ENCOUNTER — Encounter: Payer: Self-pay | Admitting: Thoracic Surgery

## 2011-09-20 ENCOUNTER — Encounter: Payer: 59 | Admitting: Thoracic Surgery

## 2011-09-20 ENCOUNTER — Other Ambulatory Visit: Payer: Self-pay

## 2011-09-20 DIAGNOSIS — C3490 Malignant neoplasm of unspecified part of unspecified bronchus or lung: Secondary | ICD-10-CM

## 2011-09-20 NOTE — Progress Notes (Signed)
This encounter was created in error - please disregard.

## 2011-09-21 ENCOUNTER — Other Ambulatory Visit: Payer: Self-pay | Admitting: Thoracic Surgery

## 2011-09-21 DIAGNOSIS — C3490 Malignant neoplasm of unspecified part of unspecified bronchus or lung: Secondary | ICD-10-CM

## 2011-09-23 ENCOUNTER — Ambulatory Visit (INDEPENDENT_AMBULATORY_CARE_PROVIDER_SITE_OTHER): Payer: 59 | Admitting: Thoracic Surgery

## 2011-09-23 ENCOUNTER — Encounter: Payer: Self-pay | Admitting: Thoracic Surgery

## 2011-09-23 ENCOUNTER — Ambulatory Visit
Admission: RE | Admit: 2011-09-23 | Discharge: 2011-09-23 | Disposition: A | Discharge status: Home / Self Care | Payer: 59 | Source: Ambulatory Visit | Attending: Thoracic Surgery | Admitting: Thoracic Surgery

## 2011-09-23 VITALS — BP 124/72 | HR 87 | Resp 16 | Ht 67.0 in | Wt 162.0 lb

## 2011-09-23 DIAGNOSIS — C3490 Malignant neoplasm of unspecified part of unspecified bronchus or lung: Secondary | ICD-10-CM

## 2011-09-23 DIAGNOSIS — Z9889 Other specified postprocedural states: Secondary | ICD-10-CM

## 2011-09-23 NOTE — Progress Notes (Signed)
HPI patient returns for followup after a right lower lobectomy for stage today non-small cell lung cancer. This is incision is well healed. Chest x-ray shows normal postoperative changes on the right. He's had a recent increase in his PCA and will be see him a urologist. Overall he is doing well. I will refer him back to Dr. Gwenyth Bouillon. I will see him again in 3 months with a CT scan.   Current Outpatient Prescriptions  Medication Sig Dispense Refill  . ALPRAZolam (XANAX) 0.25 MG tablet Take 1 tablet (0.25 mg total) by mouth at bedtime as needed.  30 tablet  2  . citalopram (CELEXA) 20 MG tablet Take 20 mg by mouth daily.        Marland Kitchen lisinopril (PRINIVIL,ZESTRIL) 40 MG tablet Take 40 mg by mouth daily.        Marland Kitchen omeprazole (PRILOSEC) 20 MG capsule Take 20 mg by mouth daily.        . simvastatin (ZOCOR) 40 MG tablet Take 40 mg by mouth at bedtime. Takes 40mg  Mon/ Wed/Fridays only       . polysaccharide iron (NIFEREX) 150 MG CAPS capsule Take 150 mg by mouth daily.        . promethazine (PHENERGAN) 25 MG tablet Take 25 mg by mouth every 6 (six) hours as needed.        . traMADol (ULTRAM) 50 MG tablet Take 50 mg by mouth every 6 (six) hours as needed.           Review of Systems: Unchanged   Physical Exam incisions were well healed lungs were clear to auscultation percussion heart was regular sinus rhythm with no murmur  Diagnostic Tests: Chest x-ray showed normal postoperative changes on the right   Impression: Status post right lower lobectomy for stage IIA non-small cell lung cancer   Plan: Refer Dr. Gwenyth Bouillon followup 3 month CT scan

## 2011-09-30 ENCOUNTER — Ambulatory Visit
Admission: RE | Admit: 2011-09-30 | Discharge: 2011-09-30 | Disposition: A | Discharge status: Home / Self Care | Payer: 59 | Source: Ambulatory Visit | Attending: Radiation Oncology | Admitting: Radiation Oncology

## 2011-10-08 ENCOUNTER — Encounter: Payer: Self-pay | Admitting: *Deleted

## 2011-10-10 ENCOUNTER — Ambulatory Visit (HOSPITAL_BASED_OUTPATIENT_CLINIC_OR_DEPARTMENT_OTHER): Payer: 59 | Admitting: Internal Medicine

## 2011-10-10 VITALS — BP 153/80 | HR 89 | Temp 98.2°F | Ht 67.0 in | Wt 165.1 lb

## 2011-10-10 DIAGNOSIS — C3430 Malignant neoplasm of lower lobe, unspecified bronchus or lung: Secondary | ICD-10-CM

## 2011-10-10 DIAGNOSIS — C3490 Malignant neoplasm of unspecified part of unspecified bronchus or lung: Secondary | ICD-10-CM

## 2011-10-10 NOTE — Progress Notes (Signed)
No images are attached to the encounter. No scans are attached to the encounter. No scans are attached to the encounter. [facility] OFFICE PROGRESS NOTE  Pearla Dubonnet, MD, MD [address] [facility], [place]. [place] [address]  DIAGNOSIS:  :  Stage IIB/IIIA non-small cell lung cancer, squamous cell carcinoma diagnosed in February of 2012.  PRIOR THERAPY: 1) Status post a course of concurrent chemoradiation with weekly carboplatin and paclitaxel.  Last dose of chemotherapy was given on 03/21/2011. 2) status post right fats, right thoracotomy, right mid and right lower lobectomy under the care of Dr. Edwyna Shell on 06/10/2011. CURRENT THERAPY:observation.  INTERVAL HISTORY: Todd Villanueva 70 y.o. male returns for  regular visit for followup for evaluation of his lung cancer. The patient underwent a right mid and right lower lobectomies under the care of Dr. Edwyna Shell on 06/10/2011. He is feeling fine and recovering from his surgery fairly well. He denied having any significant chest pain or shortness of breath, he denied having any cough or hemoptysis. He has no significant weight loss, no fever or chills no headache no blurry vision or double vision. The patient has a recent chest x-ray by Dr. Edwyna Shell that showed no evidence for disease recurrence. No other significant complaints today.he came today for evaluation and discussion of adjuvant treatment options.  MEDICAL HISTORY: Past Medical History  Diagnosis Date  . Hypertension   . lung ca dx'd 02/2011    xrt/chemo comp 03/30/11, stage IIIA  . Hyperlipidemia     ALLERGIES:  is allergic to ambien; hydrocodone-acetaminophen; and oxycodone.  MEDICATIONS: Mr. Luu does not currently have medications on file.  SURGICAL HISTORY:  Past Surgical History  Procedure Date  . Right vats, rt thoracotomy, rt middle and rt lower  lobectomy 06/10/2011    Dr Edwyna Shell  . Knee arthroscopy   .  Tonsillectomy   . Coronary stent placement JULY,AUGUST 2011  . Orif femoral neck fracture w/ dhs     REVIEW OF SYSTEMS:  A comprehensive review of systems was negative.   PHYSICAL EXAMINATION: ECOG PERFORMANCE STATUS: 1 - Symptomatic but completely ambulatory  Filed Vitals:   10/10/11 1512  BP: 153/80  Pulse: 89  Temp: 98.2 F (36.8 C)    LABORATORY DATA: Lab Results  Component Value Date   WBC 7.2 06/15/2011   HGB 8.2* 06/15/2011   HCT 24.4* 06/15/2011   MCV 96.8 06/15/2011   PLT 159 06/15/2011      Chemistry      Component Value Date/Time   NA 133* 06/15/2011 0358   K 4.1 06/15/2011 0358   CL 98 06/15/2011 0358   CO2 27 06/15/2011 0358   BUN 20 06/15/2011 0358   CREATININE 0.97 06/15/2011 0358      Component Value Date/Time   CALCIUM 9.0 06/15/2011 0358   ALKPHOS 250* 06/12/2011 0355   AST 24 06/12/2011 0355   ALT 15 06/12/2011 0355   BILITOT 0.5 06/12/2011 0355       RADIOGRAPHIC STUDIES:  none  ASSESSMENT: stage IIIa non-small cell lung cancer consistent with squama cell carcinoma status post the right mid and lower bilobectomy, with the final pathology showed focal squama cell carcinoma associated with extensive necrosis and giant cells. The dissected lymph nodes were negative for malignancy and the final pathology stage was ypTIa, ypN0.  PLAN: The patient will continue on observation for now. I would see him back for followup visit in 3 months with repeat CT scan  of the chest with contrast. There is no benefit for adjuvant chemotherapy for a patient with a stage IA. I discussed my recommendation with the patient and he is in agreement with the current plan.  All questions were answered. The patient knows to call the clinic with any problems, questions or concerns. We can certainly see the patient much sooner if necessary.

## 2011-10-11 ENCOUNTER — Telehealth: Payer: Self-pay | Admitting: Internal Medicine

## 2011-10-11 NOTE — Telephone Encounter (Signed)
[facility] APPTS FOR LB/CT 2/5 + mm 2/7. CT @ [facility] @ 1 PM.

## 2011-10-17 ENCOUNTER — Ambulatory Visit: Payer: 59 | Admitting: Physician Assistant

## 2011-11-07 NOTE — Telephone Encounter (Signed)
  Phone Note Outgoing Call   Call placed by: Lajean Saver RN,  May 27, 2011 3:27 PM Call placed to: Patient Action Taken: Phone Call Completed Summary of Call: Callback: Patient reports he is not any better. He is taking the pain medication with minimal relief. He contacted his surgeon who wants to talk with him Monday. I advisded him to keep wearing the brace and rest but do get up and move around some for lung expansion.

## 2011-11-07 NOTE — Progress Notes (Signed)
Summary: POSSIBLE BROKEN RIB (rm 4)   Vital Signs:  Patient Profile:   70 Years Old Male CC:      left side rib pain post fall last night Height:     67 inches Weight:      173 pounds O2 Sat:      98 % O2 treatment:    Room Air Temp:     98.6 degrees F oral Pulse rate:   86 / minute Resp:     18 per minute BP sitting:   145 / 76  (right arm) Cuff size:   regular  Pt. in pain?   yes    Location:   left ribs    Intensity:   10    Type:       sharp  Vitals Entered By: Lajean Saver RN (May 26, 2011 8:28 AM)                   Updated Prior Medication List: SIMVASTATIN 40 MG TABS (SIMVASTATIN) 1 three times a week , M ,W, F LISINOPRIL 40 MG TABS (LISINOPRIL) 1 tab by mouth once daily ASPIRIN 81 MG TABS (ASPIRIN) 1 once daily CITALOPRAM HYDROBROMIDE 40 MG TABS (CITALOPRAM HYDROBROMIDE) 1/2 tab by mouth once daily OMEPRAZOLE 40 MG CPDR (OMEPRAZOLE) 1 once daily  Current Allergies (reviewed today): ! VICODINHistory of Present Illness Chief Complaint: left side rib pain post fall last night History of Present Illness:  Subjective:  Patient tripped on a single step last night and landed on concrete anteriorly with his left hand pressing against his right chest.  He felt immediate pain in his left anterior chest, and now has pain with inspiration and movement.  He is scheduled for right lung resection  next week.  No shortness of breath.  Contacted patient's surgeon; advised need for chest X-ray:  approved.  REVIEW OF SYSTEMS Constitutional Symptoms      Denies fever, chills, night sweats, weight loss, weight gain, and fatigue.  Eyes       Denies change in vision, eye pain, eye discharge, glasses, contact lenses, and eye surgery. Ear/Nose/Throat/Mouth       Denies hearing loss/aids, change in hearing, ear pain, ear discharge, dizziness, frequent runny nose, frequent nose bleeds, sinus problems, sore throat, hoarseness, and tooth pain or bleeding.  Respiratory       Denies  dry cough, productive cough, wheezing, shortness of breath, asthma, bronchitis, and emphysema/COPD.  Cardiovascular       Denies murmurs, chest pain, and tires easily with exhertion.    Gastrointestinal       Denies stomach pain, nausea/vomiting, diarrhea, constipation, blood in bowel movements, and indigestion. Genitourniary       Denies painful urination, kidney stones, and loss of urinary control. Neurological       Denies paralysis, seizures, and fainting/blackouts. Musculoskeletal       Denies muscle pain, joint pain, joint stiffness, decreased range of motion, redness, swelling, muscle weakness, and gout.  Skin       Denies bruising, unusual mles/lumps or sores, and hair/skin or nail changes.  Psych       Denies mood changes, temper/anger issues, anxiety/stress, speech problems, depression, and sleep problems. Other Comments: Patient fell down some steps last night falling into left side injuring left ribs. He c/o pain 10/10. Increasing pain with cough and movement. No respiratory distress. 02 sat 98%   Past History:  Past Medical History: Reviewed history from 01/21/2011 and no changes required. 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 D deficiency B12 deficiency Iron deficiency anemia  Past Surgical History: Reviewed history from 12/23/2010 and no changes required. Coronary stenting July, August 2011 Left knee arthroscopy Tonsillectomy Right femoral neck fracture Right 9th rib fracture Colonoscopy 2011  Family History: Reviewed history from 12/23/2010 and no changes required. Father - Multiple myeloma Brother - Multiple myeloma Sister - emphysema  Social History: Married, has children.   Quit smoking Occasional alcohol use.  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   Objective:  Patient is in no acute distress, but has pain with movement.  He is alert and oriented  Eyes:  Pupils are equal, round, and reactive to light and accomodation.  Extraocular movement is intact.  Conjunctivae are not inflamed.  Neck:  Supple.  No adenopathy is present.  Lungs:  Clear to auscultation.  Breath sounds are equal.  Chest:  Distinct tenderness over left anterior chest approximately 3rd, 4th, and 5th ribs.  No crepitance.  Possibly some mild swelling.  No ecchymosis. Heart:  Regular rate and rhythm without murmurs, rubs, or gallops.  Abdomen:  Nontender without masses or hepatosplenomegaly.  Bowel sounds are present.  No CVA or flank tenderness.  X-ray left ribs and chest:  IMPRESSION: 1.  No acute left rib fracture is seen.  No pneumothorax. 2.  Probable scarring at the right lung base . Assessment New Problems: CONTUSION OF CHEST WALL (ICD-922.1) RIB PAIN, LEFT SIDED (ICD-786.50)  NO EVIDENCE RIB FRACTURE.  POSSIBLE COSTOCHONDRAL SEPARATION  Plan New Medications/Changes: ULTRACET 37.5-325 MG TABS (TRAMADOL-ACETAMINOPHEN) 1 to 2 tabs by mouth, two times a day as needed pain  #20 x 1, 05/26/2011, Donna Christen MD  New Orders: T-DG Ribs Unilateral w/Chest*L* [71101] Pulse Oximetry (single measurment) [94760] Rib Belt [L0210] Est. Patient Level IV [16109] Planning Comments:   Rib belt applied; patient notes decrease in pain.  Begin applying ice pack several times daily.  [facility] information and instruction patient handout given.  Ultracet for pain; advised to minimize usage. Recommend he contact his surgeon:  ? consider delay in lung surgery. Recommend re-evaluation if dyspnea develops   The patient and/or caregiver has been counseled thoroughly with regard to medications prescribed including dosage, schedule, interactions, rationale for use, and possible side effects and they verbalize understanding.  Diagnoses and expected  course of recovery discussed and will return if not improved as expected or if the condition worsens. Patient and/or caregiver verbalized understanding.  Prescriptions: ULTRACET 37.5-325 MG TABS (TRAMADOL-ACETAMINOPHEN) 1 to 2 tabs by mouth, two times a day as needed pain  #20 x 1   Entered and Authorized by:   Donna Christen MD   Signed by:   Donna Christen MD on 05/26/2011   Method used:   Print then Give to Patient   RxID:   [id number]   Orders Added: 1)  T-DG Ribs Unilateral w/Chest*L* [71101] 2)  Pulse Oximetry (single measurment) [94760] 3)  Rib Belt [L0210] 4)  Est. Patient Level IV [95621]

## 2012-01-10 ENCOUNTER — Ambulatory Visit (HOSPITAL_COMMUNITY)
Admission: RE | Admit: 2012-01-10 | Discharge: 2012-01-10 | Disposition: A | Discharge status: Home / Self Care | Payer: 59 | Source: Ambulatory Visit | Attending: Internal Medicine | Admitting: Internal Medicine

## 2012-01-10 ENCOUNTER — Other Ambulatory Visit (HOSPITAL_BASED_OUTPATIENT_CLINIC_OR_DEPARTMENT_OTHER): Payer: 59 | Admitting: Lab

## 2012-01-10 DIAGNOSIS — N289 Disorder of kidney and ureter, unspecified: Secondary | ICD-10-CM | POA: Insufficient documentation

## 2012-01-10 DIAGNOSIS — Z923 Personal history of irradiation: Secondary | ICD-10-CM | POA: Insufficient documentation

## 2012-01-10 DIAGNOSIS — C3490 Malignant neoplasm of unspecified part of unspecified bronchus or lung: Secondary | ICD-10-CM | POA: Insufficient documentation

## 2012-01-10 DIAGNOSIS — Z902 Acquired absence of lung [part of]: Secondary | ICD-10-CM | POA: Insufficient documentation

## 2012-01-10 DIAGNOSIS — I708 Atherosclerosis of other arteries: Secondary | ICD-10-CM | POA: Insufficient documentation

## 2012-01-10 DIAGNOSIS — I2510 Atherosclerotic heart disease of native coronary artery without angina pectoris: Secondary | ICD-10-CM | POA: Insufficient documentation

## 2012-01-10 DIAGNOSIS — I700 Atherosclerosis of aorta: Secondary | ICD-10-CM | POA: Insufficient documentation

## 2012-01-10 DIAGNOSIS — Z9221 Personal history of antineoplastic chemotherapy: Secondary | ICD-10-CM | POA: Insufficient documentation

## 2012-01-10 LAB — CBC WITH DIFFERENTIAL/PLATELET
BASO%: 0.6 % (ref 0.0–2.0)
Basophils Absolute: 0 10*3/uL (ref 0.0–0.1)
HCT: 25.3 % — ABNORMAL LOW (ref 38.4–49.9)
HGB: 8.4 g/dL — ABNORMAL LOW (ref 13.0–17.1)
MONO#: 0.7 10*3/uL (ref 0.1–0.9)
NEUT%: 70.1 % (ref 39.0–75.0)
WBC: 6.5 10*3/uL (ref 4.0–10.3)
lymph#: 1 10*3/uL (ref 0.9–3.3)

## 2012-01-10 LAB — CMP (CANCER CENTER ONLY)
ALT(SGPT): 28 U/L (ref 10–47)
BUN, Bld: 27 mg/dL — ABNORMAL HIGH (ref 7–22)
CO2: 26 mEq/L (ref 18–33)
Calcium: 8.8 mg/dL (ref 8.0–10.3)
Chloride: 102 mEq/L (ref 98–108)
Creat: 1.1 mg/dl (ref 0.6–1.2)

## 2012-01-10 MED ORDER — IOHEXOL 300 MG/ML  SOLN
80.0000 mL | Freq: Once | INTRAMUSCULAR | Status: AC | PRN
  Administered 2012-01-10: 80 mL via INTRAVENOUS

## 2012-01-12 ENCOUNTER — Ambulatory Visit (HOSPITAL_BASED_OUTPATIENT_CLINIC_OR_DEPARTMENT_OTHER): Payer: 59 | Admitting: Internal Medicine

## 2012-01-12 ENCOUNTER — Telehealth: Payer: Self-pay | Admitting: Internal Medicine

## 2012-01-12 VITALS — BP 169/90 | HR 93 | Temp 96.9°F | Wt 172.5 lb

## 2012-01-12 DIAGNOSIS — M25519 Pain in unspecified shoulder: Secondary | ICD-10-CM

## 2012-01-12 DIAGNOSIS — C3490 Malignant neoplasm of unspecified part of unspecified bronchus or lung: Secondary | ICD-10-CM

## 2012-01-12 NOTE — Telephone Encounter (Signed)
appts made and printed for md and ct,pt works at [employer] and will get labs there  aom

## 2012-01-12 NOTE — Progress Notes (Signed)
[facility] OFFICE PROGRESS NOTE  Pearla Dubonnet, MD, MD [address] [place] [address]  DIAGNOSIS: Stage IIB/IIIA non-small cell lung cancer, squamous cell carcinoma diagnosed in February of 2012.   PRIOR THERAPY:  1) Status post a course of concurrent chemoradiation with weekly carboplatin and paclitaxel. Last dose of chemotherapy was given on 03/21/2011.  2) status post right VATS, right thoracotomy, right middle and right lower lobectomies under the care of Dr. Edwyna Shell on 06/10/2011.   CURRENT THERAPY:observation.  INTERVAL HISTORY: Todd Villanueva 71 y.o. male returns to the clinic today for routine three-month followup visit accompanied his wife. He is feeling fine today except for bilateral shoulder pain from heavy lifting at work. He denied having any significant chest pain or shortness of breath, no cough or hemoptysis. He gained 7 pounds since his last visit. He has repeat CT scan of the chest performed recently and he is here today for evaluation and discussion of his scan results.  MEDICAL HISTORY: Past Medical History  Diagnosis Date  . Hypertension   . lung ca dx'd 02/2011    xrt/chemo comp 03/30/11, stage IIIA  . Hyperlipidemia     ALLERGIES:  is allergic to ambien; hydrocodone-acetaminophen; and oxycodone.  MEDICATIONS:  Current Outpatient Prescriptions  Medication Sig Dispense Refill  . ALPRAZolam (XANAX) 0.25 MG tablet Take 1 tablet (0.25 mg total) by mouth at bedtime as needed.  30 tablet  2  . citalopram (CELEXA) 20 MG tablet Take 20 mg by mouth daily.        Marland Kitchen lisinopril (PRINIVIL,ZESTRIL) 40 MG tablet Take 40 mg by mouth daily.        Marland Kitchen omeprazole (PRILOSEC) 20 MG capsule Take 20 mg by mouth daily.        . simvastatin (ZOCOR) 40 MG tablet Take 40 mg by mouth at bedtime. Takes 40mg  Mon/ Wed/Fridays only       . aspirin 81 MG tablet Take 81 mg by mouth daily.          SURGICAL HISTORY:  Past Surgical History    Procedure Date  . Right vats, rt thoracotomy, rt middle and rt lower  lobectomy 06/10/2011    Dr Edwyna Shell  . Knee arthroscopy   . Tonsillectomy   . Coronary stent placement JULY,AUGUST 2011  . Orif femoral neck fracture w/ dhs     REVIEW OF SYSTEMS:  A comprehensive review of systems was negative except for: Musculoskeletal: positive for bilateral shoulder pain.   PHYSICAL EXAMINATION: General appearance: alert, cooperative and no distress Lymph nodes: Cervical, supraclavicular, and axillary nodes normal. Resp: clear to auscultation bilaterally Cardio: regular rate and rhythm, S1, S2 normal, no murmur, click, rub or gallop GI: soft, non-tender; bowel sounds normal; no masses,  no organomegaly Extremities: extremities normal, atraumatic, no cyanosis or edema Neurologic: Alert and oriented X 3, normal strength and tone. Normal symmetric reflexes. Normal coordination and gait  ECOG PERFORMANCE STATUS: 0 - Asymptomatic  Blood pressure 169/90, pulse 93, temperature 96.9 F (36.1 C), temperature source Oral, weight 172 lb 8 oz (78.245 kg).  LABORATORY DATA: Lab Results  Component Value Date   WBC 6.5 01/10/2012   HGB 8.4* 01/10/2012   HCT 25.3* 01/10/2012   MCV 87.9 01/10/2012   PLT 245 01/10/2012      Chemistry      Component Value Date/Time   NA 142 01/10/2012 1205   NA 133* 06/15/2011 0358   K  3.9 01/10/2012 1205   K 4.1 06/15/2011 0358   CL 102 01/10/2012 1205   CL 98 06/15/2011 0358   CO2 26 01/10/2012 1205   CO2 27 06/15/2011 0358   BUN 27* 01/10/2012 1205   BUN 20 06/15/2011 0358   CREATININE 1.1 01/10/2012 1205   CREATININE 0.97 06/15/2011 0358      Component Value Date/Time   CALCIUM 8.8 01/10/2012 1205   CALCIUM 9.0 06/15/2011 0358   ALKPHOS 271* 01/10/2012 1205   ALKPHOS 250* 06/12/2011 0355   AST 28 01/10/2012 1205   AST 24 06/12/2011 0355   ALT 15 06/12/2011 0355   BILITOT 0.60 01/10/2012 1205   BILITOT 0.5 06/12/2011 0355       RADIOGRAPHIC STUDIES: Ct Chest W Contrast  01/10/2012   *RADIOLOGY REPORT*  Clinical Data: Lung cancer diagnosed in March 2012.  Chemotherapy and radiation therapy completed 03/30/2011.  CT CHEST WITH CONTRAST  Technique:  Multidetector CT imaging of the chest was performed following the standard protocol during bolus administration of intravenous contrast.  Contrast: 80mL OMNIPAQUE IOHEXOL 300 MG/ML IV SOLN  Comparison: Multiple priors, most recently PET CT 05/10/2011, and chest CT 04/08/2011.  Findings:  Mediastinum: Heart size is borderline enlarged. There is no significant pericardial fluid, thickening or pericardial calcification. There is atherosclerosis of the thoracic aorta, the great vessels of the mediastinum and the coronary arteries, including calcified atherosclerotic plaque in the left main, left anterior descending, left circumflex and right coronary arteries. There is also a prominent predominately calcified plaque at the origin of the right subclavian artery, with appearance suggestive of a hemodynamically significant (i.e., greater 70% diameter) stenosis; distal to this the proximal right subclavian artery is focally dilated up to 1.5 cm.  No pathologically enlarged mediastinal or hilar lymph nodes. Esophagus is unremarkable in appearance.  Lungs/Pleura:  Postoperative changes of right middle and lower lobectomy. Architectural distortion and interstitial prominence are noted in the residual right upper lobe, an expected postoperative and postradiation changes.  There is also some fine subpleural reticulation in the periphery of the left lung, similar to (but slightly increased) when compared to the prior examination. Background of mild-moderate centrilobular paraseptal emphysema, most pronounced in the lung apices. New small right-sided pleural effusion with some right-sided pleural thickening (an expected post- treatment change).  Upper Abdomen:  Atherosclerosis.  A 1.1 cm low attenuation lesion in upper pole right kidney is consistent with a small  cyst.  Musculoskeletal: There are no aggressive appearing lytic or blastic lesions noted in the visualized portions of the skeleton.  Multiple healed bilateral rib fractures and healing post thoracotomy changes in the right ribs are noted.  IMPRESSION: 1.  Status post right middle and lower lobectomy with the expected post surgical and post treatment related changes in the right hemithorax, as detailed above.  Today's study demonstrates no definitive evidence to suggest local recurrence of disease or new metastatic disease within the thorax. 2. Atherosclerosis, including left main and three-vessel coronary artery disease. Please note that although the presence of coronary artery calcium documents the presence of coronary artery disease, the severity of this disease and any potential stenosis cannot be assessed on this non-gated CT examination.  Assessment for potential risk factor modification, dietary therapy or pharmacologic therapy may be warranted, if clinically indicated. 3. 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  Original Report Authenticated By: Florencia Reasons, M.D.    ASSESSMENT: This is a very pleasant 71 years old white male  with history of stage IIIa non-small cell lung cancer status post concurrent chemoradiation followed by right middle and lower lobectomies under the care of Dr. Edwyna Shell. The patient is doing fine and he has no evidence for disease recurrence.   PLAN: I discussed the scan results with the patient and recommended for him observation for now with repeat CT scan of the chest in 3 months. He would come back for followup visit at that time. Regarding the questionable atherosclerosis seen on the recent scan, I advised the patient to contact his cardiologist for evaluation. He was also advised to call me immediately if he has any concerning symptoms in the interval.  All questions were answered. The patient knows to call the clinic  with any problems, questions or concerns. We can certainly see the patient much sooner if necessary.

## 2012-01-24 ENCOUNTER — Encounter: Payer: Self-pay | Admitting: Thoracic Surgery

## 2012-01-24 ENCOUNTER — Ambulatory Visit (INDEPENDENT_AMBULATORY_CARE_PROVIDER_SITE_OTHER): Payer: 59 | Admitting: Thoracic Surgery

## 2012-01-24 VITALS — BP 180/90 | HR 72 | Resp 18 | Ht 67.0 in | Wt 172.5 lb

## 2012-01-24 DIAGNOSIS — Z09 Encounter for follow-up examination after completed treatment for conditions other than malignant neoplasm: Secondary | ICD-10-CM

## 2012-01-24 DIAGNOSIS — C3430 Malignant neoplasm of lower lobe, unspecified bronchus or lung: Secondary | ICD-10-CM

## 2012-01-25 NOTE — Progress Notes (Signed)
HP the patient returns for followup. His incisions are well-healed. Recent CT scan showed no evidence of recurrence of his cancer he is some mild pain particularly with coughing. He is doing well overall. Current Outpatient Prescriptions  Medication Sig Dispense Refill  . ALPRAZolam (XANAX) 0.25 MG tablet Take 1 tablet (0.25 mg total) by mouth at bedtime as needed.  30 tablet  2  . aspirin 81 MG tablet Take 81 mg by mouth daily.        . citalopram (CELEXA) 20 MG tablet Take 20 mg by mouth daily.        Marland Kitchen lisinopril (PRINIVIL,ZESTRIL) 40 MG tablet Take 40 mg by mouth daily.        Marland Kitchen omeprazole (PRILOSEC) 20 MG capsule Take 20 mg by mouth daily.        . simvastatin (ZOCOR) 40 MG tablet Take 40 mg by mouth at bedtime. Takes 40mg  Mon/ Wed/Fridays only          Review of Systems: Mild chest pain   Physical Exam lungs are clear attestation percussion   Diagnostic Tests: CT scan showed no evidence of recurrence   Impression: Stage since the early non-small cell lung cancer right lower lobe status post resection with radiation and chemotherapy return in 3 months after next chest x-ray   Plan:

## 2012-01-27 ENCOUNTER — Other Ambulatory Visit (HOSPITAL_COMMUNITY): Payer: Self-pay | Admitting: Cardiology

## 2012-02-06 ENCOUNTER — Other Ambulatory Visit: Payer: Self-pay

## 2012-02-06 DIAGNOSIS — G8918 Other acute postprocedural pain: Secondary | ICD-10-CM

## 2012-02-06 MED ORDER — TRAMADOL HCL 50 MG PO TABS: 50.0000 mg | ORAL_TABLET | Freq: Four times a day (QID) | ORAL | Status: DC | PRN

## 2012-02-14 ENCOUNTER — Ambulatory Visit (HOSPITAL_COMMUNITY)
Admission: RE | Admit: 2012-02-14 | Discharge: 2012-02-14 | Disposition: A | Discharge status: Home / Self Care | Payer: 59 | Source: Ambulatory Visit | Attending: Cardiology | Admitting: Cardiology

## 2012-02-14 DIAGNOSIS — I059 Rheumatic mitral valve disease, unspecified: Secondary | ICD-10-CM | POA: Insufficient documentation

## 2012-02-14 DIAGNOSIS — R072 Precordial pain: Secondary | ICD-10-CM | POA: Insufficient documentation

## 2012-02-14 NOTE — Progress Notes (Signed)
  Echocardiogram 2D Echocardiogram has been performed.  Todd Villanueva L 02/14/2012, 2:15 PM

## 2012-02-24 ENCOUNTER — Telehealth: Payer: Self-pay | Admitting: *Deleted

## 2012-02-24 NOTE — Telephone Encounter (Signed)
Pt called stating that he is experiencing a cough and wanted to see if Dr Donnald Garre could prescribe him "those little bead" he had given him when he was on chemo.  Pt is currently on observation and he stated he called Dr Donnald Garre b/c his PCP is out of town.  Informed him Dr Donnald Garre is out of town.  Pt stated he would just call back on Monday.  SLJ

## 2012-02-27 ENCOUNTER — Other Ambulatory Visit (HOSPITAL_COMMUNITY): Payer: 59

## 2012-04-09 ENCOUNTER — Ambulatory Visit (HOSPITAL_COMMUNITY)
Admission: RE | Admit: 2012-04-09 | Discharge: 2012-04-09 | Disposition: A | Discharge status: Home / Self Care | Payer: 59 | Source: Ambulatory Visit | Attending: Internal Medicine | Admitting: Internal Medicine

## 2012-04-09 DIAGNOSIS — J438 Other emphysema: Secondary | ICD-10-CM | POA: Insufficient documentation

## 2012-04-09 DIAGNOSIS — J90 Pleural effusion, not elsewhere classified: Secondary | ICD-10-CM | POA: Insufficient documentation

## 2012-04-09 DIAGNOSIS — C3490 Malignant neoplasm of unspecified part of unspecified bronchus or lung: Secondary | ICD-10-CM | POA: Insufficient documentation

## 2012-04-09 DIAGNOSIS — I708 Atherosclerosis of other arteries: Secondary | ICD-10-CM | POA: Insufficient documentation

## 2012-04-09 DIAGNOSIS — I2510 Atherosclerotic heart disease of native coronary artery without angina pectoris: Secondary | ICD-10-CM | POA: Insufficient documentation

## 2012-04-09 LAB — CREATININE, SERUM: GFR calc Af Amer: >90 mL/min (ref >90)

## 2012-04-09 LAB — BUN: BUN: 25 mg/dL — ABNORMAL HIGH (ref 6–23)

## 2012-04-09 MED ORDER — IOHEXOL 300 MG/ML  SOLN
80.0000 mL | Freq: Once | INTRAMUSCULAR | Status: AC | PRN
  Administered 2012-04-09: 80 mL via INTRAVENOUS

## 2012-04-11 ENCOUNTER — Ambulatory Visit (HOSPITAL_BASED_OUTPATIENT_CLINIC_OR_DEPARTMENT_OTHER): Payer: 59 | Admitting: Lab

## 2012-04-11 ENCOUNTER — Telehealth: Payer: Self-pay | Admitting: Internal Medicine

## 2012-04-11 ENCOUNTER — Ambulatory Visit (HOSPITAL_BASED_OUTPATIENT_CLINIC_OR_DEPARTMENT_OTHER): Payer: 59 | Admitting: Internal Medicine

## 2012-04-11 VITALS — BP 160/84 | HR 82 | Temp 96.9°F | Ht 67.0 in | Wt 165.2 lb

## 2012-04-11 DIAGNOSIS — C3490 Malignant neoplasm of unspecified part of unspecified bronchus or lung: Secondary | ICD-10-CM

## 2012-04-11 DIAGNOSIS — C3430 Malignant neoplasm of lower lobe, unspecified bronchus or lung: Secondary | ICD-10-CM

## 2012-04-11 LAB — CBC WITH DIFFERENTIAL/PLATELET
Basophils Absolute: 0 10*3/uL (ref 0.0–0.1)
Eosinophils Absolute: 0.1 10*3/uL (ref 0.0–0.5)
HCT: 27.4 % — ABNORMAL LOW (ref 38.4–49.9)
HGB: 8.6 g/dL — ABNORMAL LOW (ref 13.0–17.1)
LYMPH%: 21.6 % (ref 14.0–49.0)
MCHC: 31.4 g/dL — ABNORMAL LOW (ref 32.0–36.0)
MONO#: 0.5 10*3/uL (ref 0.1–0.9)
NEUT#: 3.7 10*3/uL (ref 1.5–6.5)
NEUT%: 66.4 % (ref 39.0–75.0)
Platelets: 236 10*3/uL (ref 140–400)
WBC: 5.6 10*3/uL (ref 4.0–10.3)

## 2012-04-11 LAB — COMPREHENSIVE METABOLIC PANEL
ALT: 22 U/L (ref 0–53)
BUN: 27 mg/dL — ABNORMAL HIGH (ref 6–23)
CO2: 28 mEq/L (ref 19–32)
Creatinine, Ser: 1.03 mg/dL (ref 0.50–1.35)
Total Bilirubin: 0.4 mg/dL (ref 0.3–1.2)

## 2012-04-11 NOTE — Progress Notes (Signed)
[facility] [phone]   [phone]  OFFICE PROGRESS NOTE  Pearla Dubonnet, MD, MD [address] [place] [address]  DIAGNOSIS: Stage IIB/IIIA non-small cell lung cancer, squamous cell carcinoma diagnosed in February of 2012.   PRIOR THERAPY:  1) Status post a course of concurrent chemoradiation with weekly carboplatin and paclitaxel. Last dose of chemotherapy was given on 03/21/2011.  2) status post right VATS, right thoracotomy, right middle and right lower lobectomies under the care of Dr. Edwyna Shell on 06/10/2011.   CURRENT THERAPY:observation.   INTERVAL HISTORY: Todd Villanueva 71 y.o. male returns to the clinic today for routine three-month followup visit. The patient is feeling fine today with no specific complaints except for shortness breath with exertion. He seen by Dr. Craige Cotta for evaluation and treatment of COPD. He denied having any significant weight loss or night sweats. He has no chest pain, no cough or hemoptysis. The patient has repeat CT scan of the chest performed recently and he is here today for evaluation and discussion of his scan results.  MEDICAL HISTORY: Past Medical History  Diagnosis Date  . Hypertension   . lung ca dx'd 02/2011    xrt/chemo comp 03/30/11, stage IIIA  . Hyperlipidemia     ALLERGIES:  is allergic to hydrocodone-acetaminophen; oxycodone; and zolpidem tartrate.  MEDICATIONS:  Current Outpatient Prescriptions  Medication Sig Dispense Refill  . ALPRAZolam (XANAX) 0.25 MG tablet Take 1 tablet (0.25 mg total) by mouth at bedtime as needed.  30 tablet  2  . aspirin 81 MG tablet Take 81 mg by mouth daily.        . citalopram (CELEXA) 20 MG tablet Take 20 mg by mouth daily.        Marland Kitchen lisinopril (PRINIVIL,ZESTRIL) 40 MG tablet Take 40 mg by mouth daily.        Marland Kitchen omeprazole (PRILOSEC) 20 MG capsule Take 20 mg by mouth daily.        . simvastatin (ZOCOR) 40 MG tablet Take 40 mg by mouth at  bedtime. Takes 40mg  Mon/ Wed/Fridays only       . traMADol (ULTRAM) 50 MG tablet Take 1 tablet (50 mg total) by mouth every 6 (six) hours as needed for pain.  40 tablet  0    SURGICAL HISTORY:  Past Surgical History  Procedure Date  . Right vats, rt thoracotomy, rt middle and rt lower  lobectomy 06/10/2011    Dr Edwyna Shell  . Knee arthroscopy   . Tonsillectomy   . Coronary stent placement JULY,AUGUST 2011  . Orif femoral neck fracture w/ dhs     REVIEW OF SYSTEMS:  A comprehensive review of systems was negative except for: Respiratory: positive for dyspnea on exertion   PHYSICAL EXAMINATION: General appearance: alert, cooperative and no distress Head: Normocephalic, without obvious abnormality, atraumatic Neck: no adenopathy Lymph nodes: Cervical, supraclavicular, and axillary nodes normal. Resp: clear to auscultation bilaterally Cardio: regular rate and rhythm, S1, S2 normal, no murmur, click, rub or gallop GI: soft, non-tender; bowel sounds normal; no masses,  no organomegaly Extremities: extremities normal, atraumatic, no cyanosis or edema Neurologic: Alert and oriented X 3, normal strength and tone. Normal symmetric reflexes. Normal coordination and gait  ECOG PERFORMANCE STATUS: 1 - Symptomatic but completely ambulatory  Blood pressure 160/84, pulse 82, temperature 96.9 F (36.1 C), temperature source Oral, height 5\' 7"  (1.702 m), weight 165 lb 3.2 oz (74.934 kg).  LABORATORY DATA: Lab Results  Component Value Date   WBC 6.5 01/10/2012   HGB 8.4* 01/10/2012   HCT 25.3* 01/10/2012   MCV 87.9 01/10/2012   PLT 245 01/10/2012      Chemistry      Component Value Date/Time   NA 142 01/10/2012 1205   NA 133* 06/15/2011 0358   K 3.9 01/10/2012 1205   K 4.1 06/15/2011 0358   CL 102 01/10/2012 1205   CL 98 06/15/2011 0358   CO2 26 01/10/2012 1205   CO2 27 06/15/2011 0358   BUN 25* 04/09/2012 0850   BUN 27* 01/10/2012 1205   CREATININE 0.95 04/09/2012 0850   CREATININE 1.1 01/10/2012 1205        Component Value Date/Time   CALCIUM 8.8 01/10/2012 1205   CALCIUM 9.0 06/15/2011 0358   ALKPHOS 271* 01/10/2012 1205   ALKPHOS 250* 06/12/2011 0355   AST 28 01/10/2012 1205   AST 24 06/12/2011 0355   ALT 15 06/12/2011 0355   BILITOT 0.60 01/10/2012 1205   BILITOT 0.5 06/12/2011 0355       RADIOGRAPHIC STUDIES: Ct Chest W Contrast  04/09/2012  *RADIOLOGY REPORT*  Clinical Data: Lung cancer.  CT CHEST WITH CONTRAST  Technique:  Multidetector CT imaging of the chest was performed following the standard protocol during bolus administration of intravenous contrast.  Contrast: 80mL OMNIPAQUE IOHEXOL 300 MG/ML  SOLN  Comparison: 01/10/2012.  Findings: Mediastinal lymph nodes measure up to 9 mm in short axis in the high right paratracheal station, stable.  No hilar or axillary adenopathy.  There is atherosclerotic calcification of the arterial vasculature, including coronary arteries.  Focal calcification and narrowing of the right brachiocephalic artery is seen with post stenotic dilatation.  Heart size normal.  No pericardial effusion.  Postoperative changes of right middle and right lower lobectomies. Radiation changes at the base of the right hemithorax with a chronic small right organized pleural fluid collection, partially loculated anteriorly, stable.  Emphysema.  Mild subpleural reticulation, left greater than right.  Left lung is otherwise clear.  No left pleural fluid.  Airway is otherwise unremarkable.  Incidental imaging of the upper abdomen shows no acute findings. Adrenal glands are unremarkable.  An 11 mm low attenuation lesion in the right kidney is stable and likely a cyst.  No worrisome lytic or sclerotic lesions.  Thoracotomy changes and healed rib fractures are seen on the right.  Old rib fractures on the left.  IMPRESSION:  1.  Postoperative and postradiation changes in the right hemithorax, with a chronic small right pleural fluid collection, stable.  No evidence of recurrent or metastatic disease. 2.   Emphysema with mild superimposed subpleural fibrosis. 3.  Coronary artery calcification.  Original Report Authenticated By: Reyes Ivan, M.D.    ASSESSMENT: This is a very pleasant 71 years old white male with history of stage IIb non-small cell lung cancer status post concurrent chemoradiation followed by right middle and lower lobectomies under the care of Dr. Edwyna Shell. The patient is currently on observation. He is doing fine with no evidence for disease recurrence.  PLAN: I discussed the scan results with the patient. I recommended for him continuous observation for now with repeat CT scan of the chest with contrast in 3 months. He was advised to call me immediately if he has any concerning symptoms in the interval.   All questions were answered. The patient knows to call the clinic with any problems, questions or concerns. We can certainly see the patient much sooner if necessary.

## 2012-04-11 NOTE — Progress Notes (Signed)
Pt given rx for labs to be drawn at [facility] on 07/09/12

## 2012-04-11 NOTE — Telephone Encounter (Signed)
Gave pt appt for August 2013 ,lab and Ct @ [facility] then see MD, Diane will give pt a prescription for lab to be drawn @ [facility]. Pt was sent to the labs today per MD

## 2012-04-12 ENCOUNTER — Telehealth: Payer: Self-pay | Admitting: Medical Oncology

## 2012-04-12 NOTE — Telephone Encounter (Signed)
Pt notified of lab results

## 2012-04-13 ENCOUNTER — Ambulatory Visit (INDEPENDENT_AMBULATORY_CARE_PROVIDER_SITE_OTHER): Payer: 59 | Admitting: Pulmonary Disease

## 2012-04-13 ENCOUNTER — Encounter: Payer: Self-pay | Admitting: Pulmonary Disease

## 2012-04-13 VITALS — BP 122/76 | HR 96 | Temp 98.6°F | Ht 66.0 in | Wt 166.6 lb

## 2012-04-13 DIAGNOSIS — J449 Chronic obstructive pulmonary disease, unspecified: Secondary | ICD-10-CM

## 2012-04-13 DIAGNOSIS — J438 Other emphysema: Secondary | ICD-10-CM

## 2012-04-13 DIAGNOSIS — C3490 Malignant neoplasm of unspecified part of unspecified bronchus or lung: Secondary | ICD-10-CM

## 2012-04-13 MED ORDER — BUDESONIDE-FORMOTEROL FUMARATE 160-4.5 MCG/ACT IN AERO: 2.0000 | INHALATION_SPRAY | Freq: Two times a day (BID) | RESPIRATORY_TRACT | Status: DC

## 2012-04-13 NOTE — Assessment & Plan Note (Signed)
He is followed by Dr. Arbutus Ped and Dr. Edwyna Shell.

## 2012-04-13 NOTE — Patient Instructions (Signed)
Will schedule PFT (breathing test) Symbicort two puffs twice per day, and rinse mouth after each use Follow up in 1 month

## 2012-04-13 NOTE — Assessment & Plan Note (Signed)
He has progressive dypsnea.  Will start him on symbicort and arrange for PFT's to further assess.

## 2012-04-13 NOTE — Progress Notes (Signed)
Chief Complaint  Patient presents with  . Follow-up    Pt states he is still having sob w/ very little activity w/ wheezing. denies any cough and chest tx. Pt states the inhalers are to expensive for him to afford--    History of Present Illness: Todd Villanueva is a 71 y.o. male former smoker with Emphysema.  S/p Rt middle and lower lobectomy for Stage IIIA lung cancer.  He has noticed more trouble with his breathing with exertion.  He has occasional cough and wheeze.  He does not usually bring up sputum.  He tried spiriva before, but this did not help much.  He thinks he used symbicort before and this work better.   Past Medical History  Diagnosis Date  . Hypertension   . lung ca dx'd 02/2011    xrt/chemo comp 03/30/11, stage IIIA  . Hyperlipidemia     Past Surgical History  Procedure Date  . Right vats, rt thoracotomy, rt middle and rt lower  lobectomy 06/10/2011    Dr Edwyna Shell  . Knee arthroscopy   . Tonsillectomy   . Coronary stent placement JULY,AUGUST 2011  . Orif femoral neck fracture w/ dhs     Allergies  Allergen Reactions  . Hydrocodone-Acetaminophen     REACTION: Itch  . Oxycodone Itching  . Zolpidem Tartrate Other (See Comments)    nightmares    Physical Exam:  Blood pressure 122/76, pulse 96, temperature 98.6 F (37 C), temperature source Oral, height 5\' 6"  (1.676 m), weight 166 lb 9.6 oz (75.569 kg), SpO2 99.00%. Body mass index is 26.89 kg/(m^2). Wt Readings from Last 2 Encounters:  04/13/12 166 lb 9.6 oz (75.569 kg)  04/11/12 165 lb 3.2 oz (74.934 kg)    General - Obese HEENT - no sinus tenderness, no oral exudate Cardiac - s1s2 regular Chest - no wheeze, decreased breath sounds Abdomen - soft, nontender Extremities - no edema Skin - no rashes Neurologic - normal strength Psychiatric - normal mood, behavior  Ct Chest W Contrast  04/09/2012  *RADIOLOGY REPORT*   Clinical Data: Lung cancer.   CT CHEST WITH CONTRAST   Technique:  Multidetector  CT imaging of the chest was performed following the standard protocol during bolus administration of intravenous contrast.   Contrast: 80mL OMNIPAQUE IOHEXOL 300 MG/ML  SOLN  Comparison: 01/10/2012.   Findings: Mediastinal lymph nodes measure up to 9 mm in short axis in the high right paratracheal station, stable.  No hilar or axillary adenopathy.  There is atherosclerotic calcification of the arterial vasculature, including coronary arteries.  Focal calcification and narrowing of the right brachiocephalic artery is seen with post stenotic dilatation.  Heart size normal.  No pericardial effusion.  Postoperative changes of right middle and right lower lobectomies. Radiation changes at the base of the right hemithorax with a chronic small right organized pleural fluid collection, partially loculated anteriorly, stable.  Emphysema.  Mild subpleural reticulation, left greater than right.  Left lung is otherwise clear.  No left pleural fluid.  Airway is otherwise unremarkable.  Incidental imaging of the upper abdomen shows no acute findings. Adrenal glands are unremarkable.  An 11 mm low attenuation lesion in the right kidney is stable and likely a cyst.  No worrisome lytic or sclerotic lesions.  Thoracotomy changes and healed rib fractures are seen on the right.  Old rib fractures on the left.   IMPRESSION:  1.  Postoperative and postradiation changes in the right hemithorax, with a chronic small right pleural fluid  collection, stable.  No evidence of recurrent or metastatic disease. 2.  Emphysema with mild superimposed subpleural fibrosis. 3.  Coronary artery calcification.   Original Report Authenticated By: Reyes Ivan, M.D.     Assessment/Plan:  Outpatient Encounter Prescriptions as of 04/13/2012  Medication Sig Dispense Refill  . aspirin 81 MG tablet Take 81 mg by mouth daily.        . citalopram (CELEXA) 20 MG tablet Take 20 mg by mouth daily.        Marland Kitchen lisinopril (PRINIVIL,ZESTRIL) 40 MG tablet  Take 40 mg by mouth daily.        Marland Kitchen omeprazole (PRILOSEC) 20 MG capsule Take 20 mg by mouth daily.        . simvastatin (ZOCOR) 40 MG tablet Take 40 mg by mouth at bedtime. Takes 40mg  Mon/ Wed/Fridays only       . budesonide-formoterol (SYMBICORT) 160-4.5 MCG/ACT inhaler Inhale 2 puffs into the lungs 2 (two) times daily.  1 Inhaler  12  . DISCONTD: ALPRAZolam (XANAX) 0.25 MG tablet Take 1 tablet (0.25 mg total) by mouth at bedtime as needed.  30 tablet  2  . DISCONTD: traMADol (ULTRAM) 50 MG tablet Take 1 tablet (50 mg total) by mouth every 6 (six) hours as needed for pain.  40 tablet  0    Kurstin Dimarzo Pager:  [phone] 04/13/2012, 5:45 PM

## 2012-04-18 ENCOUNTER — Ambulatory Visit: Payer: 59 | Admitting: Thoracic Surgery

## 2012-04-19 ENCOUNTER — Other Ambulatory Visit (HOSPITAL_COMMUNITY): Payer: Self-pay | Admitting: Radiology

## 2012-04-19 ENCOUNTER — Ambulatory Visit (HOSPITAL_COMMUNITY)
Admission: RE | Admit: 2012-04-19 | Discharge: 2012-04-19 | Disposition: A | Discharge status: Home / Self Care | Payer: 59 | Source: Ambulatory Visit | Attending: Pulmonary Disease | Admitting: Pulmonary Disease

## 2012-04-19 DIAGNOSIS — J449 Chronic obstructive pulmonary disease, unspecified: Secondary | ICD-10-CM

## 2012-04-19 DIAGNOSIS — J4489 Other specified chronic obstructive pulmonary disease: Secondary | ICD-10-CM | POA: Insufficient documentation

## 2012-04-19 LAB — PULMONARY FUNCTION TEST

## 2012-04-19 MED ORDER — ALBUTEROL SULFATE (5 MG/ML) 0.5% IN NEBU
2.5000 mg | INHALATION_SOLUTION | Freq: Once | RESPIRATORY_TRACT | Status: AC
  Administered 2012-04-19: 2.5 mg via RESPIRATORY_TRACT

## 2012-05-09 ENCOUNTER — Encounter: Payer: Self-pay | Admitting: Pulmonary Disease

## 2012-05-16 ENCOUNTER — Encounter: Payer: Self-pay | Admitting: Pulmonary Disease

## 2012-05-16 ENCOUNTER — Ambulatory Visit (INDEPENDENT_AMBULATORY_CARE_PROVIDER_SITE_OTHER): Payer: 59 | Admitting: Pulmonary Disease

## 2012-05-16 VITALS — BP 130/72 | HR 99 | Temp 98.4°F | Ht 67.0 in | Wt 168.4 lb

## 2012-05-16 DIAGNOSIS — J438 Other emphysema: Secondary | ICD-10-CM

## 2012-05-16 DIAGNOSIS — J439 Emphysema, unspecified: Secondary | ICD-10-CM

## 2012-05-16 MED ORDER — ALBUTEROL SULFATE HFA 108 (90 BASE) MCG/ACT IN AERS: 2.0000 | INHALATION_SPRAY | Freq: Four times a day (QID) | RESPIRATORY_TRACT | Status: DC | PRN

## 2012-05-16 NOTE — Assessment & Plan Note (Signed)
He feels albuterol works best for him.  He has been tried on spiriva, and symbicort w/o benefit.

## 2012-05-16 NOTE — Patient Instructions (Signed)
Proair two puffs as needed for cough, wheeze, or chest congestion Follow up in 6 months

## 2012-05-16 NOTE — Progress Notes (Signed)
Chief Complaint  Patient presents with  . COPD    Pt states increase sob ,wheezing ,coghing upon activity. symbicort doesnt help.    History of Present Illness: Todd Villanueva is a 71 y.o. male former smoker with COPD/emphysema. S/p Rt middle and lower lobectomy for Stage IIIA lung cancer.  He tried using symbicort.  This made him feel worse.  He uses albuterol once or twice per day, and this seems to help most.  He does not have much cough.  He does not get wheeze, or chest congestion.  He has tried spiriva before, but this did not help much either.   Past Medical History  Diagnosis Date  . Hypertension   . lung ca dx'd 02/2011    xrt/chemo comp 03/30/11, stage IIIA  . Hyperlipidemia   . 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 Right vats, rt thoracotomy, rt middle and rt lower  lobectomy 06/10/2011    Dr Edwyna Shell  . Knee arthroscopy   . Tonsillectomy   . Coronary stent placement JULY,AUGUST 2011  . Orif femoral neck fracture w/ dhs     Outpatient Encounter Prescriptions as of 05/16/2012  Medication Sig Dispense Refill  . aspirin 81 MG tablet Take 81 mg by mouth daily.        . citalopram (CELEXA) 20 MG tablet Take 20 mg by mouth daily.        Marland Kitchen lisinopril (PRINIVIL,ZESTRIL) 40 MG tablet Take 40 mg by mouth daily.        Marland Kitchen omeprazole (PRILOSEC) 20 MG capsule Take 20 mg by mouth daily.        . simvastatin (ZOCOR) 40 MG tablet Take 40 mg by mouth at bedtime. Takes 40mg  Mon/ Wed/Fridays only       . albuterol (PROAIR HFA) 108 (90 BASE) MCG/ACT inhaler Inhale 2 puffs into the lungs every 6 (six) hours as needed for wheezing.  1 Inhaler  3  . DISCONTD: budesonide-formoterol (SYMBICORT) 160-4.5 MCG/ACT inhaler Inhale 2 puffs into the lungs 2 (two) times daily.  1 Inhaler  12    Allergies  Allergen Reactions  . Hydrocodone-Acetaminophen     REACTION: Itch  . Oxycodone  Itching  . Zolpidem Tartrate Other (See Comments)    nightmares    Physical Exam:  Blood pressure 130/72, pulse 99, temperature 98.4 F (36.9 C), temperature source Oral, height 5\' 7"  (1.702 m), weight 168 lb 6.4 oz (76.386 kg), SpO2 99.00%.  Body mass index is 26.38 kg/(m^2).  Wt Readings from Last 2 Encounters:  05/16/12 168 lb 6.4 oz (76.386 kg)  04/13/12 166 lb 9.6 oz (75.569 kg)   General - Obese  HEENT - no sinus tenderness, no oral exudate  Cardiac - s1s2 regular  Chest - no wheeze, decreased breath sounds  Abdomen - soft, nontender  Extremities - no edema  Skin - no rashes  Neurologic - normal strength  Psychiatric - normal mood, behavior  PFT 04/19/12>>FEV1 1.76 (65%), FEV1% 68, TLC 4.22 (70%), DLCO 37%, no BD    Assessment/Plan:  Coralyn Helling, MD [facility]Critical [facility]/Sleep Pager:  [phone] 05/16/2012, 12:42 PM

## 2012-07-09 ENCOUNTER — Ambulatory Visit (HOSPITAL_COMMUNITY)
Admission: RE | Admit: 2012-07-09 | Discharge: 2012-07-09 | Disposition: A | Discharge status: Home / Self Care | Payer: 59 | Source: Ambulatory Visit | Attending: Internal Medicine | Admitting: Internal Medicine

## 2012-07-09 DIAGNOSIS — Z902 Acquired absence of lung [part of]: Secondary | ICD-10-CM | POA: Insufficient documentation

## 2012-07-09 DIAGNOSIS — C3490 Malignant neoplasm of unspecified part of unspecified bronchus or lung: Secondary | ICD-10-CM | POA: Insufficient documentation

## 2012-07-09 LAB — CBC WITH DIFFERENTIAL/PLATELET
Hemoglobin: 9.6 g/dL — ABNORMAL LOW (ref 13.0–17.0)
Lymphs Abs: 1.3 10*3/uL (ref 0.7–4.0)
Monocytes Relative: 9 % (ref 3–12)
Neutro Abs: 4.6 10*3/uL (ref 1.7–7.7)
Neutrophils Relative %: 68 % (ref 43–77)
Platelets: 223 10*3/uL (ref 150–400)
RBC: 3.38 MIL/uL — ABNORMAL LOW (ref 4.22–5.81)
WBC: 6.7 10*3/uL (ref 4.0–10.5)

## 2012-07-09 LAB — COMPREHENSIVE METABOLIC PANEL
ALT: 16 U/L (ref 0–53)
Albumin: 3.8 g/dL (ref 3.5–5.2)
Alkaline Phosphatase: 267 U/L — ABNORMAL HIGH (ref 39–117)
BUN: 21 mg/dL (ref 6–23)
Chloride: 102 mEq/L (ref 96–112)
Glucose, Bld: 105 mg/dL — ABNORMAL HIGH (ref 70–99)
Potassium: 4.1 mEq/L (ref 3.5–5.1)
Sodium: 137 mEq/L (ref 135–145)
Total Bilirubin: 0.4 mg/dL (ref 0.3–1.2)

## 2012-07-09 MED ORDER — IOHEXOL 300 MG/ML  SOLN
80.0000 mL | Freq: Once | INTRAMUSCULAR | Status: AC | PRN
  Administered 2012-07-09: 80 mL via INTRAVENOUS

## 2012-07-11 ENCOUNTER — Other Ambulatory Visit (HOSPITAL_COMMUNITY): Payer: 59

## 2012-07-11 ENCOUNTER — Ambulatory Visit (HOSPITAL_BASED_OUTPATIENT_CLINIC_OR_DEPARTMENT_OTHER): Payer: 59 | Admitting: Internal Medicine

## 2012-07-11 VITALS — BP 114/64 | HR 89 | Temp 97.0°F | Resp 20 | Ht 67.0 in | Wt 170.3 lb

## 2012-07-11 DIAGNOSIS — D649 Anemia, unspecified: Secondary | ICD-10-CM

## 2012-07-11 DIAGNOSIS — C3430 Malignant neoplasm of lower lobe, unspecified bronchus or lung: Secondary | ICD-10-CM

## 2012-07-11 DIAGNOSIS — C3490 Malignant neoplasm of unspecified part of unspecified bronchus or lung: Secondary | ICD-10-CM

## 2012-07-11 MED ORDER — INTEGRA PLUS PO CAPS: 1.0000 | ORAL_CAPSULE | Freq: Every morning | ORAL | Status: DC

## 2012-07-11 NOTE — Progress Notes (Signed)
[facility] [phone]   Fax:(336) [phone]  OFFICE PROGRESS NOTE  Pearla Dubonnet, MD [address] [place] [address]  DIAGNOSIS: Stage IIB/IIIA non-small cell lung cancer, squamous cell carcinoma diagnosed in February of 2012.   PRIOR THERAPY:  1) Status post a course of concurrent chemoradiation with weekly carboplatin and paclitaxel. Last dose of chemotherapy was given on 03/21/2011.  2) status post right VATS, right thoracotomy, right middle and right lower lobectomies under the care of Dr. Edwyna Shell on 06/10/2011.   CURRENT THERAPY:observation.  INTERVAL HISTORY: Todd Villanueva 71 y.o. male returns to the clinic today for routine three-month followup visit. The patient is doing fine today with no specific complaints except for mild fatigue. Unfortunately he returned to smoke a few cigarettes on daily basis. He would ask his primary care physician Dr. Kevan Ny for Chantix. The patient denied having any significant chest pain or shortness breath, no cough or hemoptysis. He has no weight loss or night sweats. He has repeat CT scan of the chest performed recently and he is here today for evaluation and discussion of his scan results.  MEDICAL HISTORY: Past Medical History  Diagnosis Date  . Hypertension   . lung ca dx'd 02/2011    xrt/chemo comp 03/30/11, stage IIIA  . Hyperlipidemia   . 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  MEDICATIONS:  Current Outpatient Prescriptions  Medication Sig Dispense Refill  . albuterol (PROAIR HFA) 108 (90 BASE) MCG/ACT inhaler Inhale 2 puffs into the lungs every 6 (six) hours as needed for wheezing.  1 Inhaler  3  . aspirin 81 MG tablet Take 81 mg by mouth daily.        . citalopram (CELEXA) 20 MG tablet Take 20 mg by mouth daily.          Marland Kitchen lisinopril (PRINIVIL,ZESTRIL) 40 MG tablet Take 40 mg by mouth daily.        Marland Kitchen omeprazole (PRILOSEC) 20 MG capsule Take 20 mg by mouth daily.        . simvastatin (ZOCOR) 40 MG tablet Take 40 mg by mouth at bedtime. Takes 40mg  Mon/ Wed/Fridays only       . FeFum-FePoly-FA-B Cmp-C-Biot (INTEGRA PLUS) CAPS Take 1 capsule by mouth every morning.  30 capsule  2    SURGICAL HISTORY:  Past Surgical History  Procedure Date  . Right vats, rt thoracotomy, rt middle and rt lower  lobectomy 06/10/2011    Dr Edwyna Shell  . Knee arthroscopy   . Tonsillectomy   . Coronary stent placement JULY,AUGUST 2011  . Orif femoral neck fracture w/ dhs     REVIEW OF SYSTEMS:  A comprehensive review of systems was negative except for: Constitutional: positive for fatigue   PHYSICAL EXAMINATION: General appearance: alert, cooperative and no distress Head: Normocephalic, without obvious abnormality, atraumatic Neck: no adenopathy Lymph nodes: Cervical, supraclavicular, and axillary nodes normal. Resp: clear to auscultation bilaterally Back: symmetric, no curvature. ROM normal. No CVA tenderness. Cardio: regular rate and rhythm, S1, S2 normal, no murmur, click, rub or gallop GI: soft, non-tender; bowel sounds normal; no masses,  no organomegaly Extremities: extremities normal, atraumatic, no cyanosis or edema Neurologic: Alert and oriented X 3, normal strength and tone. Normal symmetric reflexes. Normal coordination and gait  ECOG PERFORMANCE STATUS:  1 - Symptomatic but completely ambulatory  Blood pressure 114/64, pulse 89, temperature 97 F (36.1 C), temperature source Oral, resp. rate 20, height 5\' 7"  (1.702 m), weight 170 lb 4.8 oz (77.248 kg).  LABORATORY DATA: Lab Results  Component Value Date   WBC 6.7 07/09/2012   HGB 9.6* 07/09/2012   HCT 30.3* 07/09/2012   MCV 89.6 07/09/2012   PLT 223 07/09/2012      Chemistry      Component Value Date/Time   NA 137 07/09/2012 0730   NA 142 01/10/2012 1205   K 4.1  07/09/2012 0730   K 3.9 01/10/2012 1205   CL 102 07/09/2012 0730   CL 102 01/10/2012 1205   CO2 24 07/09/2012 0730   CO2 26 01/10/2012 1205   BUN 21 07/09/2012 0730   BUN 27* 01/10/2012 1205   CREATININE 1.13 07/09/2012 0730   CREATININE 1.1 01/10/2012 1205      Component Value Date/Time   CALCIUM 9.9 07/09/2012 0730   CALCIUM 8.8 01/10/2012 1205   ALKPHOS 267* 07/09/2012 0730   ALKPHOS 271* 01/10/2012 1205   AST 22 07/09/2012 0730   AST 28 01/10/2012 1205   ALT 16 07/09/2012 0730   BILITOT 0.4 07/09/2012 0730   BILITOT 0.60 01/10/2012 1205       RADIOGRAPHIC STUDIES: Ct Chest W Contrast  07/09/2012  *RADIOLOGY REPORT*  Clinical Data: Follow-up lung cancer, status post right middle and lower lobectomy  CT CHEST WITH CONTRAST  Technique:  Multidetector CT imaging of the chest was performed following the standard protocol during bolus administration of intravenous contrast.  Contrast: 80mL OMNIPAQUE IOHEXOL 300 MG/ML  SOLN  Comparison: 04/09/2012  Findings: Status post right middle and lower lobectomy with associated volume loss.  Stable radiation changes in the medial lower lung (series 3/image 32) with associated partially loculated pleural fluid collection (series 2/image 39) unchanged.  Underlying moderate to severe centrilobular and paraseptal emphysematous changes.  Subpleural reticulation in the left lower lobe.  No pneumothorax.  Visualized thyroid is unremarkable.  10 mm short axis high right paratracheal node (series 2/image 8), previously 9 mm.  No suspicious hilar or axillary lymphadenopathy.  The heart is normal in size.  No pericardial effusion.  Coronary atherosclerosis.  Atherosclerotic calcifications of the aortic arch and branch vessels, including a stable narrowing of the right subclavian artery with associated post stenotic dilatation.  Visualized upper abdomen is notable for a probable right renal cyst, incompletely visualized.  Multiple stable right rib fracture deformities, some of which are healing.  No  suspicious osseous lesions.  IMPRESSION: Status post right middle and lower lobectomy with associated postsurgical and postradiation changes.  No evidence of recurrent or metastatic disease in the chest.  10 mm short axis high right paratracheal node, previously 9 mm.  Original Report Authenticated By: Charline Bills, M.D.    ASSESSMENT: This is a very pleasant 71 years old white male with history of stage IIIa non-small cell lung cancer status post concurrent chemoradiation followed by right middle and right lower lobectomies and the patient has been observation since July of 2012 with no evidence for disease recurrence.   PLAN: I discussed the scan results with the patient and recommended for him continuous observation for now. I would see him back for followup visit in 3 months with repeat CT scan of the chest with contrast. For the anemia I will start him on Integra plus 1 capsule by mouth daily. For smoke cessation, he will contact Dr. Kevan Ny for  Chantix prescription. He was advised to call me immediately if he has any concerning symptoms in the interval.  All questions were answered. The patient knows to call the clinic with any problems, questions or concerns. We can certainly see the patient much sooner if necessary.

## 2012-07-12 ENCOUNTER — Telehealth: Payer: Self-pay | Admitting: *Deleted

## 2012-07-12 NOTE — Telephone Encounter (Signed)
left voice message to inform the patient  of the new appointments for lab and scan on 10-10-2012

## 2012-07-13 ENCOUNTER — Telehealth: Payer: Self-pay | Admitting: Internal Medicine

## 2012-07-13 NOTE — Telephone Encounter (Signed)
pt called and l/m to change ct in nov to [place],done aware     aom

## 2012-10-09 ENCOUNTER — Other Ambulatory Visit: Payer: 59 | Admitting: Lab

## 2012-10-09 ENCOUNTER — Other Ambulatory Visit (HOSPITAL_BASED_OUTPATIENT_CLINIC_OR_DEPARTMENT_OTHER): Payer: 59

## 2012-10-09 DIAGNOSIS — C3490 Malignant neoplasm of unspecified part of unspecified bronchus or lung: Secondary | ICD-10-CM

## 2012-10-09 LAB — COMPREHENSIVE METABOLIC PANEL (CC13)
Alkaline Phosphatase: 271 U/L — ABNORMAL HIGH (ref 40–150)
CO2: 27 mEq/L (ref 22–29)
Creatinine: 1.2 mg/dL (ref 0.7–1.3)
Glucose: 75 mg/dl (ref 70–99)
Sodium: 141 mEq/L (ref 136–145)
Total Bilirubin: 0.29 mg/dL (ref 0.20–1.20)
Total Protein: 7.3 g/dL (ref 6.4–8.3)

## 2012-10-10 ENCOUNTER — Encounter (HOSPITAL_COMMUNITY): Payer: Self-pay

## 2012-10-10 ENCOUNTER — Ambulatory Visit (HOSPITAL_COMMUNITY)
Admission: RE | Admit: 2012-10-10 | Discharge: 2012-10-10 | Disposition: A | Discharge status: Home / Self Care | Payer: 59 | Source: Ambulatory Visit | Attending: Internal Medicine | Admitting: Internal Medicine

## 2012-10-10 ENCOUNTER — Other Ambulatory Visit (HOSPITAL_COMMUNITY): Payer: 59

## 2012-10-10 DIAGNOSIS — C3490 Malignant neoplasm of unspecified part of unspecified bronchus or lung: Secondary | ICD-10-CM | POA: Insufficient documentation

## 2012-10-10 DIAGNOSIS — Z923 Personal history of irradiation: Secondary | ICD-10-CM | POA: Insufficient documentation

## 2012-10-10 DIAGNOSIS — Z9221 Personal history of antineoplastic chemotherapy: Secondary | ICD-10-CM | POA: Insufficient documentation

## 2012-10-10 DIAGNOSIS — Z902 Acquired absence of lung [part of]: Secondary | ICD-10-CM | POA: Insufficient documentation

## 2012-10-10 MED ORDER — IOHEXOL 300 MG/ML  SOLN
100.0000 mL | Freq: Once | INTRAMUSCULAR | Status: AC | PRN
  Administered 2012-10-10: 100 mL via INTRAVENOUS

## 2012-10-15 ENCOUNTER — Ambulatory Visit (HOSPITAL_BASED_OUTPATIENT_CLINIC_OR_DEPARTMENT_OTHER): Payer: 59 | Admitting: Internal Medicine

## 2012-10-15 ENCOUNTER — Ambulatory Visit (HOSPITAL_BASED_OUTPATIENT_CLINIC_OR_DEPARTMENT_OTHER): Payer: 59 | Admitting: Lab

## 2012-10-15 ENCOUNTER — Telehealth: Payer: Self-pay | Admitting: Internal Medicine

## 2012-10-15 VITALS — BP 160/90 | HR 79 | Temp 96.9°F | Resp 20 | Ht 67.0 in | Wt 178.8 lb

## 2012-10-15 DIAGNOSIS — C3430 Malignant neoplasm of lower lobe, unspecified bronchus or lung: Secondary | ICD-10-CM

## 2012-10-15 DIAGNOSIS — C3490 Malignant neoplasm of unspecified part of unspecified bronchus or lung: Secondary | ICD-10-CM

## 2012-10-15 LAB — COMPREHENSIVE METABOLIC PANEL (CC13)
AST: 26 U/L (ref 5–34)
Albumin: 3.5 g/dL (ref 3.5–5.0)
Alkaline Phosphatase: 248 U/L — ABNORMAL HIGH (ref 40–150)
BUN: 26 mg/dL (ref 7.0–26.0)
Glucose: 126 mg/dl — ABNORMAL HIGH (ref 70–99)
Potassium: 3.6 mEq/L (ref 3.5–5.1)
Total Bilirubin: 0.34 mg/dL (ref 0.20–1.20)

## 2012-10-15 LAB — CBC WITH DIFFERENTIAL/PLATELET
Basophils Absolute: 0 10*3/uL (ref 0.0–0.1)
EOS%: 1.8 % (ref 0.0–7.0)
HGB: 10.5 g/dL — ABNORMAL LOW (ref 13.0–17.1)
LYMPH%: 22 % (ref 14.0–49.0)
MCH: 31.6 pg (ref 27.2–33.4)
MCV: 95.5 fL (ref 79.3–98.0)
MONO%: 11.8 % (ref 0.0–14.0)
Platelets: 170 10*3/uL (ref 140–400)
RBC: 3.32 10*6/uL — ABNORMAL LOW (ref 4.20–5.82)
RDW: 14.8 % — ABNORMAL HIGH (ref 11.0–14.6)

## 2012-10-15 NOTE — Patient Instructions (Signed)
Your scan today showed no evidence for disease recurrence. Followup visit in 4 months with repeat CT scan of the chest

## 2012-10-15 NOTE — Progress Notes (Signed)
[facility] Telephone:([phone]   Fax:(336) [phone]  OFFICE PROGRESS NOTE  Pearla Dubonnet, MD [address] [place] [address]  DIAGNOSIS: Stage IIB/IIIA non-small cell lung cancer, squamous cell carcinoma diagnosed in February of 2012.   PRIOR THERAPY:  1) Status post a course of concurrent chemoradiation with weekly carboplatin and paclitaxel. Last dose of chemotherapy was given on 03/21/2011.  2) status post right VATS, right thoracotomy, right middle and right lower lobectomies under the care of Dr. Edwyna Shell on 06/10/2011.   CURRENT THERAPY:observation.  INTERVAL HISTORY: Todd Villanueva 71 y.o. male returns to the clinic today for routine three-month followup visit. The patient is feeling fine today with no specific complaints. He denied having any significant chest pain, shortness breath, cough or hemoptysis. He denied having any significant weight loss or night sweats. The patient has repeat CT scan of the chest performed recently and he is here today for evaluation and discussion of his scan results.  MEDICAL HISTORY: Past Medical History  Diagnosis Date  . Hypertension   . lung ca dx'd 02/2011    xrt/chemo comp 03/30/11, stage IIIA  . Hyperlipidemia   . 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  MEDICATIONS:  Current Outpatient Prescriptions  Medication Sig Dispense Refill  . albuterol (PROAIR HFA) 108 (90 BASE) MCG/ACT inhaler Inhale 2 puffs into the lungs every 6 (six) hours as needed for wheezing.  1 Inhaler  3  . aspirin 81 MG tablet Take 81 mg by mouth daily.        . citalopram (CELEXA) 20 MG tablet Take 20 mg by mouth daily.        Marland Kitchen FeFum-FePoly-FA-B Cmp-C-Biot (INTEGRA PLUS) CAPS Take 1 capsule by mouth every morning.  30 capsule  2  . lisinopril  (PRINIVIL,ZESTRIL) 40 MG tablet Take 40 mg by mouth daily.        Marland Kitchen omeprazole (PRILOSEC) 20 MG capsule Take 20 mg by mouth daily.        . simvastatin (ZOCOR) 40 MG tablet Take 40 mg by mouth at bedtime. Takes 40mg  Mon/ Wed/Fridays only         SURGICAL HISTORY:  Past Surgical History  Procedure Date  . Right vats, rt thoracotomy, rt middle and rt lower  lobectomy 06/10/2011    Dr Edwyna Shell  . Knee arthroscopy   . Tonsillectomy   . Coronary stent placement JULY,AUGUST 2011  . Orif femoral neck fracture w/ dhs     REVIEW OF SYSTEMS:  A comprehensive review of systems was negative.   PHYSICAL EXAMINATION: General appearance: alert, cooperative and no distress Head: Normocephalic, without obvious abnormality, atraumatic Neck: no adenopathy Lymph nodes: Cervical, supraclavicular, and axillary nodes normal. Resp: clear to auscultation bilaterally Cardio: regular rate and rhythm, S1, S2 normal, no murmur, click, rub or gallop GI: soft, non-tender; bowel sounds normal; no masses,  no organomegaly Extremities: extremities normal, atraumatic, no cyanosis or edema  ECOG PERFORMANCE STATUS: 1 - Symptomatic but completely ambulatory  Blood pressure 160/90, pulse 79, temperature 96.9 F (36.1 C), temperature source Oral, resp. rate 20, height 5\' 7"  (1.702 m), weight 178 lb 12.8 oz (81.103 kg).  LABORATORY DATA: Lab Results  Component Value Date   WBC 6.7 07/09/2012   HGB 9.6* 07/09/2012   HCT 30.3* 07/09/2012  MCV 89.6 07/09/2012   PLT 223 07/09/2012      Chemistry      Component Value Date/Time   NA 141 10/09/2012 1551   NA 137 07/09/2012 0730   NA 142 01/10/2012 1205   K 4.4 10/09/2012 1551   K 4.1 07/09/2012 0730   K 3.9 01/10/2012 1205   CL 107 10/09/2012 1551   CL 102 07/09/2012 0730   CL 102 01/10/2012 1205   CO2 27 10/09/2012 1551   CO2 24 07/09/2012 0730   CO2 26 01/10/2012 1205   BUN 28.0* 10/09/2012 1551   BUN 21 07/09/2012 0730   BUN 27* 01/10/2012 1205   CREATININE 1.2 10/09/2012 1551    CREATININE 1.13 07/09/2012 0730   CREATININE 1.1 01/10/2012 1205      Component Value Date/Time   CALCIUM 9.6 10/09/2012 1551   CALCIUM 9.9 07/09/2012 0730   CALCIUM 8.8 01/10/2012 1205   ALKPHOS 271* 10/09/2012 1551   ALKPHOS 267* 07/09/2012 0730   ALKPHOS 271* 01/10/2012 1205   AST 27 10/09/2012 1551   AST 22 07/09/2012 0730   AST 28 01/10/2012 1205   ALT 24 10/09/2012 1551   ALT 16 07/09/2012 0730   BILITOT 0.29 10/09/2012 1551   BILITOT 0.4 07/09/2012 0730   BILITOT 0.60 01/10/2012 1205       RADIOGRAPHIC STUDIES: Ct Chest W Contrast  10/10/2012  *RADIOLOGY REPORT*  Clinical Data: Restaging status post right middle and lower lobectomies and chemo radiation.  CT CHEST WITH CONTRAST  Technique:  Multidetector CT imaging of the chest was performed following the standard protocol during bolus administration of intravenous contrast.  Contrast: OMNIPAQUE IOHEXOL 300 MG/ML  SOLN  Comparison: 07/09/2012 chest CT  Findings: Postoperative changes status post right middle and lower lobectomies.  Associated volume loss with rightward mediastinal shift.  There is persistent small amount of pleural fluid/pleural thickening at the apex and at the base.  Associated consolidation with air bronchograms at the base adjacent to suture material is similar to the prior.  The lungs are emphysematous with peripheral reticular opacities, in keeping with scarring/fibrosis. Unchanged subpleural nodule within the left lower lobe measuring 6 x 4 mm on series 3 image 32.  Other than the surgical changes and posterior right consolidation described above, the central airways are patent.  No pneumothorax.  Normal caliber aorta. Normal heart size.  Coronary artery calcification.  Trace pericardial fluid. The high right paratracheal lymph node is unchanged in size at 10 mm short axis.  Limited images through the upper abdomen show no acute finding. There is a subcentimeter hypodensity arising anteriorly from the upper pole right kidney that  measures water attenuation.  Multilevel right posterolateral rib fractures are similar to prior. Remote left posterolateral rib fractures again noted.  IMPRESSION: Postradiation and operative changes status post right middle and lower lobectomies.  Chronic small right pleural fluid collection/thickening is similar to priors.  6 x 4 mm subpleural nodule on the left is nonspecific however similar to priors.  Attention at follow-up.  No definite evidence for recurrent or metastatic disease within the chest.  Background centrolobular emphysema and subpleural fibrosis.   Original Report Authenticated By: Jearld Lesch, M.D.     ASSESSMENT: This is a very pleasant 71 years old white male with history of stage IIIa non-small cell lung cancer status post concurrent chemoradiation followed by right middle and right lower lobectomies. The patient has been observation since July 2012 with no evidence for disease recurrence.  PLAN:  I discussed the scan results with the patient 8 I recommended for him to continue on observation for now with repeat CT scan of the chest in 4 months.  For the history of iron deficiency anemia, the patient will continue on Integra plus 1 capsule by mouth daily. He was advised to call me immediately if he has any concerning symptoms in the interval.  All questions were answered. The patient knows to call the clinic with any problems, questions or concerns. We can certainly see the patient much sooner if necessary.

## 2012-10-15 NOTE — Telephone Encounter (Signed)
gv pt appt schedule for march 2014. Pt could not wait for next lb appt @ 1245pm and will come back after work. Pt given lb appt for today @ 315pm.

## 2012-10-16 ENCOUNTER — Telehealth: Payer: Self-pay | Admitting: Medical Oncology

## 2012-10-16 DIAGNOSIS — D649 Anemia, unspecified: Secondary | ICD-10-CM

## 2012-10-16 MED ORDER — INTEGRA PLUS PO CAPS: 1.0000 | ORAL_CAPSULE | Freq: Every morning | ORAL | Status: DC

## 2012-10-16 NOTE — Telephone Encounter (Signed)
Pt does not have refills for integra so I called in integra per dr Asa Lente note

## 2012-11-19 ENCOUNTER — Encounter: Payer: Self-pay | Admitting: Pulmonary Disease

## 2012-11-19 ENCOUNTER — Ambulatory Visit (INDEPENDENT_AMBULATORY_CARE_PROVIDER_SITE_OTHER): Payer: 59 | Admitting: Pulmonary Disease

## 2012-11-19 VITALS — BP 120/78 | HR 92 | Temp 98.2°F | Ht 67.0 in | Wt 182.0 lb

## 2012-11-19 DIAGNOSIS — G4733 Obstructive sleep apnea (adult) (pediatric): Secondary | ICD-10-CM | POA: Insufficient documentation

## 2012-11-19 DIAGNOSIS — J439 Emphysema, unspecified: Secondary | ICD-10-CM

## 2012-11-19 DIAGNOSIS — J438 Other emphysema: Secondary | ICD-10-CM

## 2012-11-19 DIAGNOSIS — C3490 Malignant neoplasm of unspecified part of unspecified bronchus or lung: Secondary | ICD-10-CM

## 2012-11-19 NOTE — Assessment & Plan Note (Signed)
He reports snoring, sleep disruption, and daytime sleepiness.  He has history of hypertension and COPD.  I am concerned he could have sleep apnea.  I have explained how sleep apnea can affect the patient's health.  Driving precautions and importance of weight loss were discussed.  Treatment options for sleep apnea were reviewed.  To further assess will arrange for in lab sleep study.

## 2012-11-19 NOTE — Progress Notes (Signed)
Chief Complaint  Patient presents with  . Follow-up    Pt reports breathing is unchanged, about the same since last visit-- denies any other concerns at this time    History of Present Illness: Todd Villanueva is a 71 y.o. male former smoker with COPD/emphysema. S/p Rt middle and lower lobectomy, and chemoradiation therapy for Stage IIIA lung cancer.  His breathing is doing okay during the day.  He does not need to use albuterol much.  He will get winded when going up stairs, but does okay on level ground.  He is not having much cough, wheeze, or chest congestion.  He was seen by Dr. Arbutus Ped in November.  He reports trouble with his sleep.  He snores, and wakes up with a choking sensation.  He is restless at night, and has trouble staying awake in the afternoon.  TESTS: Echo 02/15/12>>EF 55% PFT 04/19/12>>FEV1 1.76 (65%), FEV1% 68, TLC 4.22 (70%), DLCO 37%, no BD CT chest 10/10/12>>emphysema, 6 mm nodule LLL, no change consolidative change Rt lung base  Past Medical History  Diagnosis Date  . Hypertension   . lung ca dx'd 02/2011    xrt/chemo comp 03/30/11, stage IIIA  . Hyperlipidemia   . 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 Right vats, rt thoracotomy, rt middle and rt lower  lobectomy 06/10/2011    Dr Edwyna Shell  . Knee arthroscopy   . Tonsillectomy   . Coronary stent placement JULY,AUGUST 2011  . Orif femoral neck fracture w/ dhs     Outpatient Encounter Prescriptions as of 11/19/2012  Medication Sig Dispense Refill  . aspirin 81 MG tablet Take 81 mg by mouth daily.        . citalopram (CELEXA) 20 MG tablet Take 20 mg by mouth daily.        Marland Kitchen FeFum-FePoly-FA-B Cmp-C-Biot (INTEGRA PLUS) CAPS Take 1 capsule by mouth every morning.  30 capsule  2  . lisinopril (PRINIVIL,ZESTRIL) 40 MG tablet Take 40 mg by mouth daily.        Marland Kitchen omeprazole (PRILOSEC) 20 MG capsule  Take 20 mg by mouth daily.        . simvastatin (ZOCOR) 40 MG tablet Take 40 mg by mouth at bedtime. Takes 40mg  Mon/ Wed/Fridays only       . albuterol (PROAIR HFA) 108 (90 BASE) MCG/ACT inhaler Inhale 2 puffs into the lungs every 6 (six) hours as needed for wheezing.  1 Inhaler  3    Allergies  Allergen Reactions  . Hydrocodone-Acetaminophen     REACTION: Itch  . Oxycodone Itching  . Zolpidem Tartrate Other (See Comments)    nightmares    Physical Exam:  Filed Vitals:   11/19/12 1531  BP: 120/78  Pulse: 92  Temp: 98.2 F (36.8 C)  TempSrc: Oral  Height: 5\' 7"  (1.702 m)  Weight: 182 lb (82.555 kg)  SpO2: 96%     Current Encounter SPO2  11/19/12 1531 96%  05/16/12 1202 99%  04/13/12 1421 99%     Body mass index is 28.51 kg/(m^2).   Wt Readings from Last 2 Encounters:  11/19/12 182 lb (82.555 kg)  10/15/12 178 lb 12.8 oz (81.103 kg)     General - No distress ENT - No sinus tenderness, MP 4, no oral exudate, no LAN Cardiac - s1s2 regular, no murmur Chest - No  wheeze/rales/dullness Back - No focal tenderness Abd - Soft, non-tender Ext - No edema Neuro - Normal strength Skin - No rashes Psych - normal mood, and behavior   Assessment/Plan:  Coralyn Helling, MD [facility]/Sleep Pager:  [phone] 11/19/2012, 3:41 PM

## 2012-11-19 NOTE — Assessment & Plan Note (Signed)
Stable on prn albuterol.  He has not experience benefit from maintenance inhaler therapy.

## 2012-11-19 NOTE — Patient Instructions (Signed)
Will arrange for sleep study Will call to arrange for follow up after sleep study reviewed 

## 2012-11-19 NOTE — Assessment & Plan Note (Signed)
He is followed by Dr. Arbutus Ped.  He will have f/u CT chest in March 2014.

## 2012-12-12 ENCOUNTER — Telehealth: Payer: Self-pay | Admitting: Pulmonary Disease

## 2012-12-12 NOTE — Telephone Encounter (Signed)
Pt was scheduled for sleep study 12/23/12.  This has been cancelled.  Will have my nurse contact pt to discuss whether he would like to reschedule sleep study.    If not, then he will need follow up appointment in June 2014 to monitor his COPD.

## 2012-12-13 NOTE — Telephone Encounter (Signed)
I spoke with pt and he stated his PCP advised him he did not need the study bc "everyone snores". Pt is not going to r/s. Pt aware to f/u in June. Nothing further was needed. Will forward to Dr. Craige Cotta so he is aware of this.

## 2012-12-23 ENCOUNTER — Encounter (HOSPITAL_BASED_OUTPATIENT_CLINIC_OR_DEPARTMENT_OTHER): Payer: 59

## 2013-01-10 ENCOUNTER — Encounter (HOSPITAL_BASED_OUTPATIENT_CLINIC_OR_DEPARTMENT_OTHER): Payer: 59

## 2013-01-23 ENCOUNTER — Other Ambulatory Visit: Payer: Self-pay | Admitting: Medical Oncology

## 2013-01-23 DIAGNOSIS — D649 Anemia, unspecified: Secondary | ICD-10-CM

## 2013-01-23 MED ORDER — INTEGRA PLUS PO CAPS: 1.0000 | ORAL_CAPSULE | Freq: Every morning | ORAL | Status: DC

## 2013-01-23 NOTE — Telephone Encounter (Signed)
Called in refill for [employer]

## 2013-02-08 ENCOUNTER — Other Ambulatory Visit (HOSPITAL_BASED_OUTPATIENT_CLINIC_OR_DEPARTMENT_OTHER): Payer: 59

## 2013-02-08 DIAGNOSIS — C3430 Malignant neoplasm of lower lobe, unspecified bronchus or lung: Secondary | ICD-10-CM

## 2013-02-08 DIAGNOSIS — C3490 Malignant neoplasm of unspecified part of unspecified bronchus or lung: Secondary | ICD-10-CM

## 2013-02-08 LAB — CBC WITH DIFFERENTIAL/PLATELET
Basophils Absolute: 0 10*3/uL (ref 0.0–0.1)
EOS%: 2.2 % (ref 0.0–7.0)
Eosinophils Absolute: 0.1 10*3/uL (ref 0.0–0.5)
HCT: 33.2 % — ABNORMAL LOW (ref 38.4–49.9)
HGB: 11.2 g/dL — ABNORMAL LOW (ref 13.0–17.1)
LYMPH%: 21.2 % (ref 14.0–49.0)
MCH: 32.5 pg (ref 27.2–33.4)
MCV: 96.2 fL (ref 79.3–98.0)
MONO%: 10.5 % (ref 0.0–14.0)
NEUT#: 4.2 10*3/uL (ref 1.5–6.5)
NEUT%: 65.6 % (ref 39.0–75.0)
Platelets: 170 10*3/uL (ref 140–400)
RDW: 13.7 % (ref 11.0–14.6)

## 2013-02-11 ENCOUNTER — Ambulatory Visit (HOSPITAL_COMMUNITY)
Admission: RE | Admit: 2013-02-11 | Discharge: 2013-02-11 | Disposition: A | Discharge status: Home / Self Care | Payer: 59 | Source: Ambulatory Visit | Attending: Internal Medicine | Admitting: Internal Medicine

## 2013-02-11 ENCOUNTER — Encounter (HOSPITAL_COMMUNITY): Payer: Self-pay

## 2013-02-11 DIAGNOSIS — N289 Disorder of kidney and ureter, unspecified: Secondary | ICD-10-CM | POA: Insufficient documentation

## 2013-02-11 DIAGNOSIS — J438 Other emphysema: Secondary | ICD-10-CM | POA: Insufficient documentation

## 2013-02-11 DIAGNOSIS — C3490 Malignant neoplasm of unspecified part of unspecified bronchus or lung: Secondary | ICD-10-CM | POA: Insufficient documentation

## 2013-02-11 DIAGNOSIS — Z902 Acquired absence of lung [part of]: Secondary | ICD-10-CM | POA: Insufficient documentation

## 2013-02-11 DIAGNOSIS — R911 Solitary pulmonary nodule: Secondary | ICD-10-CM | POA: Insufficient documentation

## 2013-02-11 DIAGNOSIS — I2510 Atherosclerotic heart disease of native coronary artery without angina pectoris: Secondary | ICD-10-CM | POA: Insufficient documentation

## 2013-02-11 MED ORDER — IOHEXOL 300 MG/ML  SOLN
80.0000 mL | Freq: Once | INTRAMUSCULAR | Status: AC | PRN
  Administered 2013-02-11: 80 mL via INTRAVENOUS

## 2013-02-12 ENCOUNTER — Ambulatory Visit (HOSPITAL_BASED_OUTPATIENT_CLINIC_OR_DEPARTMENT_OTHER): Payer: 59 | Admitting: Internal Medicine

## 2013-02-12 ENCOUNTER — Encounter: Payer: Self-pay | Admitting: Internal Medicine

## 2013-02-12 VITALS — BP 157/88 | HR 88 | Temp 98.0°F | Ht 67.0 in | Wt 184.8 lb

## 2013-02-12 DIAGNOSIS — C342 Malignant neoplasm of middle lobe, bronchus or lung: Secondary | ICD-10-CM

## 2013-02-12 DIAGNOSIS — C3430 Malignant neoplasm of lower lobe, unspecified bronchus or lung: Secondary | ICD-10-CM

## 2013-02-12 DIAGNOSIS — C3490 Malignant neoplasm of unspecified part of unspecified bronchus or lung: Secondary | ICD-10-CM

## 2013-02-12 NOTE — Progress Notes (Signed)
[facility] [phone]   [phone]  OFFICE PROGRESS NOTE  Pearla Dubonnet, MD [address] [place] [address]  DIAGNOSIS: Stage IIB/IIIA non-small cell lung cancer, squamous cell carcinoma diagnosed in February of 2012.   PRIOR THERAPY:  1) Status post a course of concurrent chemoradiation with weekly carboplatin and paclitaxel. Last dose of chemotherapy was given on 03/21/2011.  2) status post right VATS, right thoracotomy, right middle and right lower lobectomies under the care of Dr. Edwyna Shell on 06/10/2011.   CURRENT THERAPY:observation.  INTERVAL HISTORY: Todd Villanueva 72 y.o. male returns to the clinic today for a routine 4 month followup visit. The patient is feeling fine today with no specific complaints. He denied having any significant chest pain, shortness breath, cough or hemoptysis. He denied having any significant weight loss or night sweats. He had repeat CT scan of the chest performed recently and he is here for evaluation and discussion of his scan results.  MEDICAL HISTORY: Past Medical History  Diagnosis Date  . Hypertension   . lung ca dx'd 02/2011    xrt/chemo comp 03/30/11, stage IIIA  . Hyperlipidemia   . 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  MEDICATIONS:  Current Outpatient Prescriptions  Medication Sig Dispense Refill  . albuterol (PROAIR HFA) 108 (90 BASE) MCG/ACT inhaler Inhale 2 puffs into the lungs every 6 (six) hours as needed for wheezing.  1 Inhaler  3  . aspirin 81 MG tablet Take 81 mg by mouth daily.        . citalopram (CELEXA) 20 MG tablet Take 20 mg by mouth daily.        Marland Kitchen FeFum-FePoly-FA-B Cmp-C-Biot (INTEGRA PLUS) CAPS Take 1 capsule by mouth every morning.  30 capsule  0  . lisinopril (PRINIVIL,ZESTRIL) 40 MG tablet  Take 40 mg by mouth daily.        Marland Kitchen omeprazole (PRILOSEC) 20 MG capsule Take 20 mg by mouth daily.        . simvastatin (ZOCOR) 40 MG tablet Take 40 mg by mouth at bedtime. Takes 40mg  Mon/ Wed/Fridays only        No current facility-administered medications for this visit.    SURGICAL HISTORY:  Past Surgical History  Procedure Laterality Date  . Right vats, rt thoracotomy, rt middle and rt lower  lobectomy  06/10/2011    Dr Edwyna Shell  . Knee arthroscopy    . Tonsillectomy    . Coronary stent placement  JULY,AUGUST 2011  . Orif femoral neck fracture w/ dhs      REVIEW OF SYSTEMS:  A comprehensive review of systems was negative.   PHYSICAL EXAMINATION: General appearance: alert, cooperative and no distress Head: Normocephalic, without obvious abnormality, atraumatic Neck: no adenopathy Resp: clear to auscultation bilaterally Cardio: regular rate and rhythm, S1, S2 normal, no murmur, click, rub or gallop GI: soft, non-tender; bowel sounds normal; no masses,  no organomegaly Extremities: extremities normal, atraumatic, no cyanosis or edema  ECOG PERFORMANCE STATUS: 1 - Symptomatic but completely ambulatory  Blood pressure 157/88, pulse 88, temperature 98 F (36.7 C), temperature source Oral, height 5\' 7"  (1.702 m), weight 184 lb 12.8 oz (83.825 kg).  LABORATORY DATA: Lab Results  Component Value Date   WBC 6.4 02/08/2013   HGB 11.2* 02/08/2013  HCT 33.2* 02/08/2013   MCV 96.2 02/08/2013   PLT 170 02/08/2013      Chemistry      Component Value Date/Time   NA 139 10/15/2012 1521   NA 137 07/09/2012 0730   NA 142 01/10/2012 1205   K 3.6 10/15/2012 1521   K 4.1 07/09/2012 0730   K 3.9 01/10/2012 1205   CL 107 10/15/2012 1521   CL 102 07/09/2012 0730   CL 102 01/10/2012 1205   CO2 25 10/15/2012 1521   CO2 24 07/09/2012 0730   CO2 26 01/10/2012 1205   BUN 26.0 10/15/2012 1521   BUN 21 07/09/2012 0730   BUN 27* 01/10/2012 1205   CREATININE 1.2 10/15/2012 1521   CREATININE 1.13 07/09/2012 0730    CREATININE 1.1 01/10/2012 1205      Component Value Date/Time   CALCIUM 9.4 10/15/2012 1521   CALCIUM 9.9 07/09/2012 0730   CALCIUM 8.8 01/10/2012 1205   ALKPHOS 248* 10/15/2012 1521   ALKPHOS 267* 07/09/2012 0730   ALKPHOS 271* 01/10/2012 1205   AST 26 10/15/2012 1521   AST 22 07/09/2012 0730   AST 28 01/10/2012 1205   ALT 22 10/15/2012 1521   ALT 16 07/09/2012 0730   BILITOT 0.34 10/15/2012 1521   BILITOT 0.4 07/09/2012 0730   BILITOT 0.60 01/10/2012 1205       RADIOGRAPHIC STUDIES: Ct Chest W Contrast  02/11/2013  *RADIOLOGY REPORT*  Clinical Data: History of lung cancer status post right middle and lower lobectomy.  CT CHEST WITH CONTRAST  Technique:  Multidetector CT imaging of the chest was performed following the standard protocol during bolus administration of intravenous contrast.  Contrast: 80mL OMNIPAQUE IOHEXOL 300 MG/ML  SOLN  Comparison: Chest CT 10/10/2012.  Findings:  Mediastinum: Heart size is normal. Trace amount of pericardial fluid and/or thickening, unchanged, and unlikely to be of hemodynamic significance at this time.  Chronic left to right shift of cardiomediastinal structures is unchanged. There is atherosclerosis of the thoracic aorta, the great vessels of the mediastinum and the coronary arteries, including calcified atherosclerotic plaque in the left main, left anterior descending, left circumflex and right coronary arteries. No pathologically enlarged mediastinal or hilar lymph nodes. Esophagus is unremarkable in appearance.  Lungs/Pleura: In the periphery of the left lower lobe (image 32 of series 4) there is a pleural based 6 x 4 mm nodule which is unchanged compared to the recent prior examination, and is similar in retrospect compared to prior studies dating back to 04/08/2011. This is favored to be benign.  No other definite new pulmonary nodules or masses are confidently identified on today's examination.  Status post right middle and lower lobectomy with chronic right-sided  pleural thickening and chronic partially loculated right-sided pleural effusion which is small to moderate size and unchanged.  Architectural distortion throughout the remaining right upper lobe is noted, including some nodular scarring in the inferior aspect of the right upper lobe (best demonstrated on images 36 and 37 of series 4).  A background of moderate centrilobular emphysema.  Scattered peripheral linear opacities most compatible with scarring.  Upper Abdomen: Subcentimeter low-attenuation lesions in the upper pole of the right kidney are incompletely visualized, but favored to represent small cysts.  Musculoskeletal: Multiple old healed and healing bilateral rib fractures again noted. There are no aggressive appearing lytic or blastic lesions noted in the visualized portions of the skeleton.  IMPRESSION: 1.  Status post right middle and lower lobectomy without findings to suggest local recurrence of  disease or new metastatic disease in the thorax.  Postoperative changes are similar to prior examinations, as detailed above. 2.  Multiple old healed and healing bilateral rib fractures redemonstrated. 3.  Unchanged 6 x 4 mm nodule in the periphery of the left lower lobe (image 32 of series 4), which is very similar in retrospect compared to remote prior examination 04/08/2011.  This strongly favored to be benign. 4. Atherosclerosis, including left main and three-vessel coronary artery disease. Please note that although the presence of coronary artery calcium documents the presence of coronary artery disease, the severity of this disease and any potential stenosis cannot be assessed on this non-gated CT examination.  Assessment for potential risk factor modification, dietary therapy or pharmacologic therapy may be warranted, if clinically indicated. 5.  Moderate centrilobular and paraseptal emphysema. 6.  Additional incidental findings, as above.   Original Report Authenticated By: Trudie Reed, M.D.      ASSESSMENT: This is a very pleasant 72 years old white male with history of stage IIb/IIIa non-small cell lung cancer Todd Villanueva, cell carcinoma status post concurrent chemoradiation followed by right middle and lower lobectomies and has been observation since July 2012 with no evidence for disease recurrence.  PLAN: I discussed the scan results with the patient today. I recommended for him to continue on observation with repeat CT scan of the chest in 6 months. He was advised to call me immediately if he has any concerning symptoms in the interval.  All questions were answered. The patient knows to call the clinic with any problems, questions or concerns. We can certainly see the patient much sooner if necessary.

## 2013-02-12 NOTE — Patient Instructions (Signed)
No evidence for disease recurrence on the recent scan. Followup in 6 months with repeat CT scan of the chest.  

## 2013-02-18 ENCOUNTER — Telehealth: Payer: Self-pay | Admitting: Internal Medicine

## 2013-02-21 ENCOUNTER — Other Ambulatory Visit: Payer: Self-pay | Admitting: Internal Medicine

## 2013-02-21 DIAGNOSIS — D649 Anemia, unspecified: Secondary | ICD-10-CM

## 2013-02-25 ENCOUNTER — Encounter: Payer: Self-pay | Admitting: Internal Medicine

## 2013-04-05 ENCOUNTER — Other Ambulatory Visit (HOSPITAL_COMMUNITY): Payer: Self-pay | Admitting: Cardiology

## 2013-04-05 DIAGNOSIS — R0602 Shortness of breath: Secondary | ICD-10-CM

## 2013-04-05 DIAGNOSIS — I2510 Atherosclerotic heart disease of native coronary artery without angina pectoris: Secondary | ICD-10-CM

## 2013-04-05 DIAGNOSIS — R5383 Other fatigue: Secondary | ICD-10-CM

## 2013-04-12 ENCOUNTER — Encounter (HOSPITAL_COMMUNITY)
Admission: RE | Admit: 2013-04-12 | Discharge: 2013-04-12 | Disposition: A | Discharge status: Home / Self Care | Payer: 59 | Source: Ambulatory Visit | Attending: Cardiology | Admitting: Cardiology

## 2013-04-12 ENCOUNTER — Other Ambulatory Visit: Payer: Self-pay

## 2013-04-12 DIAGNOSIS — R5383 Other fatigue: Secondary | ICD-10-CM

## 2013-04-12 DIAGNOSIS — I2510 Atherosclerotic heart disease of native coronary artery without angina pectoris: Secondary | ICD-10-CM | POA: Insufficient documentation

## 2013-04-12 DIAGNOSIS — R5381 Other malaise: Secondary | ICD-10-CM | POA: Insufficient documentation

## 2013-04-12 DIAGNOSIS — R0602 Shortness of breath: Secondary | ICD-10-CM | POA: Insufficient documentation

## 2013-04-12 MED ORDER — TECHNETIUM TC 99M SESTAMIBI - CARDIOLITE
10.0000 | Freq: Once | INTRAVENOUS | Status: AC | PRN
  Administered 2013-04-12: 10 via INTRAVENOUS

## 2013-04-12 MED ORDER — REGADENOSON 0.4 MG/5ML IV SOLN
0.4000 mg | Freq: Once | INTRAVENOUS | Status: AC
  Administered 2013-04-12: 0.4 mg via INTRAVENOUS

## 2013-04-12 MED ORDER — REGADENOSON 0.4 MG/5ML IV SOLN
INTRAVENOUS | Status: AC
  Filled 2013-04-12: qty 5

## 2013-04-12 MED ORDER — TECHNETIUM TC 99M SESTAMIBI GENERIC - CARDIOLITE
30.0000 | Freq: Once | INTRAVENOUS | Status: AC | PRN
  Administered 2013-04-12: 30 via INTRAVENOUS

## 2013-04-17 ENCOUNTER — Other Ambulatory Visit (HOSPITAL_COMMUNITY): Payer: Self-pay | Admitting: Cardiology

## 2013-04-17 DIAGNOSIS — I2510 Atherosclerotic heart disease of native coronary artery without angina pectoris: Secondary | ICD-10-CM

## 2013-04-18 ENCOUNTER — Other Ambulatory Visit (HOSPITAL_COMMUNITY): Payer: Self-pay | Admitting: Cardiology

## 2013-04-19 ENCOUNTER — Ambulatory Visit (HOSPITAL_COMMUNITY)
Admission: RE | Admit: 2013-04-19 | Discharge: 2013-04-19 | Disposition: A | Discharge status: Home / Self Care | Payer: 59 | Source: Ambulatory Visit | Attending: Cardiology | Admitting: Cardiology

## 2013-04-19 DIAGNOSIS — J449 Chronic obstructive pulmonary disease, unspecified: Secondary | ICD-10-CM | POA: Insufficient documentation

## 2013-04-19 DIAGNOSIS — I2510 Atherosclerotic heart disease of native coronary artery without angina pectoris: Secondary | ICD-10-CM | POA: Insufficient documentation

## 2013-04-19 DIAGNOSIS — I10 Essential (primary) hypertension: Secondary | ICD-10-CM | POA: Insufficient documentation

## 2013-04-19 DIAGNOSIS — E785 Hyperlipidemia, unspecified: Secondary | ICD-10-CM | POA: Insufficient documentation

## 2013-04-19 DIAGNOSIS — F17200 Nicotine dependence, unspecified, uncomplicated: Secondary | ICD-10-CM | POA: Insufficient documentation

## 2013-04-19 DIAGNOSIS — J4489 Other specified chronic obstructive pulmonary disease: Secondary | ICD-10-CM | POA: Insufficient documentation

## 2013-04-22 NOTE — Progress Notes (Signed)
  Echocardiogram 2D Echocardiogram has been performed.  Cathie Beams 04/22/2013, 12:52 PM

## 2013-04-25 ENCOUNTER — Encounter (HOSPITAL_COMMUNITY): Payer: Self-pay | Admitting: Emergency Medicine

## 2013-04-25 ENCOUNTER — Emergency Department (INDEPENDENT_AMBULATORY_CARE_PROVIDER_SITE_OTHER)
Admission: EM | Admit: 2013-04-25 | Discharge: 2013-04-25 | Disposition: A | Discharge status: Home / Self Care | Payer: 59 | Source: Home / Self Care | Attending: Emergency Medicine | Admitting: Emergency Medicine

## 2013-04-25 ENCOUNTER — Emergency Department (INDEPENDENT_AMBULATORY_CARE_PROVIDER_SITE_OTHER): Payer: 59

## 2013-04-25 DIAGNOSIS — IMO0002 Reserved for concepts with insufficient information to code with codable children: Secondary | ICD-10-CM

## 2013-04-25 DIAGNOSIS — S83242A Other tear of medial meniscus, current injury, left knee, initial encounter: Secondary | ICD-10-CM

## 2013-04-25 MED ORDER — HYDROCODONE-IBUPROFEN 7.5-200 MG PO TABS: 1.0000 | ORAL_TABLET | Freq: Two times a day (BID) | ORAL | Status: DC | PRN

## 2013-04-25 NOTE — ED Notes (Signed)
Pt refused knee sleeve. He already has one at home and at work. Will apply when he gets to work.

## 2013-04-25 NOTE — ED Notes (Signed)
Pt c/o left knee pain x 4 days. Was using a riding lawnmower and injured himself. Feels like he tore a ligament. Has a hx of injury in the same knee. Has appt to see his ortho doctor on June 12th. Request pain meds and an xray today. Patient is alert and oriented.

## 2013-04-25 NOTE — ED Provider Notes (Signed)
History     CSN: [id number]  Arrival date & time 04/25/13  1002   First MD Initiated Contact with Patient 04/25/13 1011      Chief Complaint  Patient presents with  . Knee Pain    (Consider location/radiation/quality/duration/timing/severity/associated sxs/prior treatment) HPI Comments: Patient presents to urgent care this morning complaining of ongoing and worsening anterior Left knee pain. Patient describes that he was lawn mowing prior to initiation of his pain he does not recall a particular event or gesture denies any falls or direct injuries to his knee " I must have done something to my knee that day", patient describes pain as a throbbing type, located globally to the anterior aspect of his knee. He describes some degree of swelling. Denies any fevers, numbness or tingling sensation or weakness of his Left lower extremity. Patient describes that he had been seen by Dr. Valentina Gu, for a meniscal tear some years back. He attempted to call his office but unable to see him in July according to the front desk receptionist. Pain is exacerbated by movement such as knee extension and walking on his knee. Patient reports that in the past he has had mild to moderate itchiness when he takes narcotics such as Lortab or Percocet. It usually goes away quickly. He denies having had any anaphylactic type reaction or other symptoms suggestive of allergenic reaction such as shortness of breath, difficulty swallowing or facial swelling. No hives.  Patient is a 72 y.o. male presenting with knee pain. The history is provided by the patient.  Knee Pain Location:  Knee Injury: yes   Knee location:  L knee Pain details:    Quality:  Aching, pressure and sharp   Radiates to:  Does not radiate   Duration:  4 days   Progression:  Worsening Chronicity:  New Dislocation: no   Foreign body present:  No foreign bodies Worsened by:  Activity and extension Ineffective treatments:  None tried Associated symptoms:  stiffness and swelling   Associated symptoms: no back pain, no decreased ROM, no fatigue, no fever, no muscle weakness and no numbness   Risk factors: no frequent fractures and no recent illness     Past Medical History  Diagnosis Date  . Hypertension   . lung ca dx'd 02/2011    xrt/chemo comp 03/30/11, stage IIIA  . Hyperlipidemia   . 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 Right vats, rt thoracotomy, rt middle and rt lower  lobectomy  06/10/2011    Dr Edwyna Shell  . Knee arthroscopy    . Tonsillectomy    . Coronary stent placement  JULY,AUGUST 2011  . Orif femoral neck fracture w/ dhs      Family History  Problem Relation Age of Onset  . Cancer Father     multiple myeloma  . COPD Sister     emphysema  . Cancer Brother     multiple myeloma    History  Substance Use Topics  . Smoking status: Former Smoker -- 0.50 packs/day for 53 years    Types: Cigarettes    Quit date: 01/05/2011  . Smokeless tobacco: Never Used     Comment: has had a few cigs since quitting   . Alcohol Use: Yes     Comment: occasionally      Review of Systems  Constitutional: Positive for  activity change. Negative for fever, chills, diaphoresis, appetite change and fatigue.  Respiratory: Negative for cough and shortness of breath.   Gastrointestinal: Negative for abdominal pain.  Genitourinary: Negative for dysuria.  Musculoskeletal: Positive for joint swelling and stiffness. Negative for myalgias, back pain and arthralgias.  Skin: Negative for color change, pallor and rash.  Neurological: Negative for dizziness, syncope, weakness, numbness and headaches.    Allergies  Hydrocodone-acetaminophen; Oxycodone; and Zolpidem tartrate  Home Medications   Current Outpatient Rx  Name  Route  Sig  Dispense  Refill  . citalopram (CELEXA) 20 MG tablet   Oral   Take 20 mg by mouth  daily.           Marland Kitchen FeFum-FePoly-FA-B Cmp-C-Biot (FOLIVANE-PLUS) CAPS      TAKE 1 CAPSULE BY MOUTH EVERY MORNING.   30 capsule   5   . lisinopril (PRINIVIL,ZESTRIL) 40 MG tablet   Oral   Take 40 mg by mouth daily.           Marland Kitchen omeprazole (PRILOSEC) 20 MG capsule   Oral   Take 20 mg by mouth daily.           . simvastatin (ZOCOR) 40 MG tablet   Oral   Take 40 mg by mouth at bedtime. Takes 40mg  Mon/ Wed/Fridays only          . albuterol (PROAIR HFA) 108 (90 BASE) MCG/ACT inhaler   Inhalation   Inhale 2 puffs into the lungs every 6 (six) hours as needed for wheezing.   1 Inhaler   3   . aspirin 81 MG tablet   Oral   Take 81 mg by mouth daily.           Marland Kitchen HYDROcodone-ibuprofen (VICOPROFEN) 7.5-200 MG per tablet   Oral   Take 1 tablet by mouth 2 (two) times daily as needed for pain.   15 tablet   0     BP 166/85  Pulse 96  Temp(Src) 97.6 F (36.4 C) (Oral)  Resp 21  SpO2 99%  Physical Exam  Nursing note and vitals reviewed. Constitutional: Vital signs are normal. He appears well-developed and well-nourished.  Non-toxic appearance. He does not have a sickly appearance. He does not appear ill. No distress.  Musculoskeletal: He exhibits tenderness.       Left knee: He exhibits bony tenderness. He exhibits normal range of motion, no swelling, no effusion, no ecchymosis, no deformity, no erythema, normal alignment, no LCL laxity, normal patellar mobility and no MCL laxity. Tenderness found. Medial joint line tenderness noted. No MCL, no LCL and no patellar tendon tenderness noted.       Legs: Skin: No rash noted. No erythema.    ED Course  Procedures (including critical care time)  Labs Reviewed - No data to display Dg Knee Complete 4 Views Left  04/25/2013   *RADIOLOGY REPORT*  Clinical Data: Injured left knee 3 days ago, medial pain.  Prior history of torn meniscus.  LEFT KNEE - COMPLETE 4+ VIEW  Comparison: Left knee x-rays 07/31/2008.  Findings: No  evidence of acute fracture or dislocation.  Severe medial compartment joint space narrowing and associated mild hypertrophic spurring, progressive since prior examination. Patellofemoral and lateral compartment joint spaces relatively well preserved.  Bone mineral density well preserved.  Possible small joint effusion.  IMPRESSION: No acute osseous abnormality.  Severe medial compartment osteoarthritis.  Possible small joint effusion.   Original Report Authenticated By: Hulan Saas, M.D.     1.  Tear, knee, medial meniscus, left, initial encounter       MDM  Medial knee pain- POSSIBLY MEDIAL MENISCUS AND COLLATERAL LIGAMENT INJURYwith small knee effusion. Patient will be put on the sleep encouraged to use crutches. We will contact Dr. Antony Odea, to see patient. ( hrough our nurse SY)- patient prescribed Vicoprofen encouraged to use Zyrtec along with this he tends to develop pruritus secondary to narcotics in the past. We discussed what symptoms should warrant immediate attention or the immediate discontinuation of.        Jimmie Molly, MD 04/25/13 1113

## 2013-05-03 NOTE — ED Notes (Signed)
Patient  out of his narcotic pain medication, and as he cannot see MD for his knee until mid June, is asking what he should do in the meantime. Patient was advised we cannot refill narcitics over the phaone, but his options are: see [place] in [facility] for recheck, call MD for earlier appt in office., or take OTC medication of choice

## 2013-05-06 ENCOUNTER — Emergency Department (INDEPENDENT_AMBULATORY_CARE_PROVIDER_SITE_OTHER)
Admission: EM | Admit: 2013-05-06 | Discharge: 2013-05-06 | Disposition: A | Discharge status: Home / Self Care | Payer: 59 | Source: Home / Self Care | Attending: Emergency Medicine | Admitting: Emergency Medicine

## 2013-05-06 ENCOUNTER — Encounter (HOSPITAL_COMMUNITY): Payer: Self-pay | Admitting: Emergency Medicine

## 2013-05-06 DIAGNOSIS — M25562 Pain in left knee: Secondary | ICD-10-CM

## 2013-05-06 DIAGNOSIS — M25569 Pain in unspecified knee: Secondary | ICD-10-CM

## 2013-05-06 MED ORDER — HYDROCODONE-IBUPROFEN 7.5-200 MG PO TABS: 1.0000 | ORAL_TABLET | Freq: Three times a day (TID) | ORAL | Status: DC | PRN

## 2013-05-06 NOTE — ED Provider Notes (Signed)
History     CSN: [id number]  Arrival date & time 05/06/13  1004   First MD Initiated Contact with Patient 05/06/13 1010      Chief Complaint  Patient presents with  . Medication Refill    (Consider location/radiation/quality/duration/timing/severity/associated sxs/prior treatment) HPI Comments: Patient presents urgent care describing ongoing pain to his left knee. He ran out of pain medicine that was previously prescribed to him ( he developed no allergic reactions to it), he describes his appointment to see his orthopedic doctor should the 12th. He's wondered if we can over the MRI study prior to him seeing the orthopedic provider. Patient has not called his primary care Dr. denies any sense of instability of his knee but it's very painful mainly in the medial aspect of it as it was on his previous visit.  The history is provided by the patient.    Past Medical History  Diagnosis Date  . Hypertension   . lung ca dx'd 02/2011    xrt/chemo comp 03/30/11, stage IIIA  . Hyperlipidemia   . 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 Right vats, rt thoracotomy, rt middle and rt lower  lobectomy  06/10/2011    Dr Edwyna Shell  . Knee arthroscopy    . Tonsillectomy    . Coronary stent placement  JULY,AUGUST 2011  . Orif femoral neck fracture w/ dhs      Family History  Problem Relation Age of Onset  . Cancer Father     multiple myeloma  . COPD Sister     emphysema  . Cancer Brother     multiple myeloma    History  Substance Use Topics  . Smoking status: Former Smoker -- 0.50 packs/day for 53 years    Types: Cigarettes    Quit date: 01/05/2011  . Smokeless tobacco: Never Used     Comment: has had a few cigs since quitting   . Alcohol Use: Yes     Comment: occasionally      Review of Systems  Constitutional: Positive for activity change. Negative for  fever and fatigue.  Musculoskeletal: Positive for joint swelling. Negative for myalgias, back pain and arthralgias.       Knee pain medial aspect  Skin: Negative for color change, pallor and wound.  Neurological: Negative for weakness and numbness.    Allergies  Hydrocodone-acetaminophen; Oxycodone; and Zolpidem tartrate  Home Medications   Current Outpatient Rx  Name  Route  Sig  Dispense  Refill  . albuterol (PROAIR HFA) 108 (90 BASE) MCG/ACT inhaler   Inhalation   Inhale 2 puffs into the lungs every 6 (six) hours as needed for wheezing.   1 Inhaler   3   . aspirin 81 MG tablet   Oral   Take 81 mg by mouth daily.           . citalopram (CELEXA) 20 MG tablet   Oral   Take 20 mg by mouth daily.           Marland Kitchen FeFum-FePoly-FA-B Cmp-C-Biot (FOLIVANE-PLUS) CAPS      TAKE 1 CAPSULE BY MOUTH EVERY MORNING.   30 capsule   5   . HYDROcodone-ibuprofen (VICOPROFEN) 7.5-200 MG per tablet   Oral   Take 1 tablet by mouth 2 (two) times daily as needed for pain.   15 tablet  0   . HYDROcodone-ibuprofen (VICOPROFEN) 7.5-200 MG per tablet   Oral   Take 1 tablet by mouth every 8 (eight) hours as needed for pain.   15 tablet   0   . lisinopril (PRINIVIL,ZESTRIL) 40 MG tablet   Oral   Take 40 mg by mouth daily.           Marland Kitchen omeprazole (PRILOSEC) 20 MG capsule   Oral   Take 20 mg by mouth daily.           . simvastatin (ZOCOR) 40 MG tablet   Oral   Take 40 mg by mouth at bedtime. Takes 40mg  Mon/ Wed/Fridays only            BP 158/95  Pulse 94  Temp(Src) 97.9 F (36.6 C) (Oral)  Resp 22  SpO2 100%  Physical Exam  Vitals reviewed. Constitutional: He appears well-developed and well-nourished.  Musculoskeletal: He exhibits tenderness.       Legs: Neurological: He is alert.  Skin: No rash noted. No erythema.    ED Course  Procedures (including critical care time)  Labs Reviewed - No data to display No results found.   No diagnosis found.    MDM    Suspected medial-meniscal or collateral ligament injury. Patient is wearing his knee sleeve. See orthopedic provider June the 12h. Exam feels like a stable knee. Prescription of Vicoprofen for 15 tablets. Unfortunately unable to order CT/MRI as an insurance company requires of pre-authorization. Have suggested to Mr. Argo, to discuss this with his primary care Dr. as he or she can ordered this study non-emergently.        Jimmie Molly, MD 05/06/13 [phone]

## 2013-05-06 NOTE — ED Notes (Signed)
Pt is needing refills on his vicoprofen and wanting a referral for an MRI of his left knee Seen here on 5/22 for the same reason... Has appt on 6/12 w/orthopedic He is alert and oriented w/no signs of acute distress.

## 2013-05-07 ENCOUNTER — Other Ambulatory Visit: Payer: Self-pay | Admitting: Internal Medicine

## 2013-05-07 DIAGNOSIS — R52 Pain, unspecified: Secondary | ICD-10-CM

## 2013-05-07 DIAGNOSIS — R609 Edema, unspecified: Secondary | ICD-10-CM

## 2013-05-10 ENCOUNTER — Other Ambulatory Visit: Payer: 59

## 2013-05-13 ENCOUNTER — Ambulatory Visit
Admission: RE | Admit: 2013-05-13 | Discharge: 2013-05-13 | Disposition: A | Discharge status: Home / Self Care | Payer: 59 | Source: Ambulatory Visit | Attending: Internal Medicine | Admitting: Internal Medicine

## 2013-05-13 DIAGNOSIS — R52 Pain, unspecified: Secondary | ICD-10-CM

## 2013-05-13 DIAGNOSIS — R609 Edema, unspecified: Secondary | ICD-10-CM

## 2013-07-10 ENCOUNTER — Other Ambulatory Visit: Payer: Self-pay

## 2013-07-12 ENCOUNTER — Encounter (INDEPENDENT_AMBULATORY_CARE_PROVIDER_SITE_OTHER): Payer: 59 | Admitting: Ophthalmology

## 2013-07-12 DIAGNOSIS — H43819 Vitreous degeneration, unspecified eye: Secondary | ICD-10-CM

## 2013-07-12 DIAGNOSIS — I10 Essential (primary) hypertension: Secondary | ICD-10-CM

## 2013-07-12 DIAGNOSIS — H35039 Hypertensive retinopathy, unspecified eye: Secondary | ICD-10-CM

## 2013-07-12 DIAGNOSIS — H348192 Central retinal vein occlusion, unspecified eye, stable: Secondary | ICD-10-CM

## 2013-07-19 ENCOUNTER — Encounter (INDEPENDENT_AMBULATORY_CARE_PROVIDER_SITE_OTHER): Payer: Self-pay | Admitting: Ophthalmology

## 2013-08-09 ENCOUNTER — Other Ambulatory Visit (HOSPITAL_BASED_OUTPATIENT_CLINIC_OR_DEPARTMENT_OTHER): Payer: 59 | Admitting: Lab

## 2013-08-09 DIAGNOSIS — C3490 Malignant neoplasm of unspecified part of unspecified bronchus or lung: Secondary | ICD-10-CM

## 2013-08-09 DIAGNOSIS — C3430 Malignant neoplasm of lower lobe, unspecified bronchus or lung: Secondary | ICD-10-CM

## 2013-08-09 LAB — COMPREHENSIVE METABOLIC PANEL (CC13)
AST: 27 U/L (ref 5–34)
Albumin: 3.6 g/dL (ref 3.5–5.0)
BUN: 24.8 mg/dL (ref 7.0–26.0)
CO2: 25 mEq/L (ref 22–29)
Calcium: 9.4 mg/dL (ref 8.4–10.4)
Chloride: 107 mEq/L (ref 98–109)
Creatinine: 1.2 mg/dL (ref 0.7–1.3)
Glucose: 111 mg/dl (ref 70–140)
Potassium: 4 mEq/L (ref 3.5–5.1)

## 2013-08-09 LAB — CBC WITH DIFFERENTIAL/PLATELET
BASO%: 0.7 % (ref 0.0–2.0)
Basophils Absolute: 0 10*3/uL (ref 0.0–0.1)
EOS%: 3.2 % (ref 0.0–7.0)
HCT: 32.4 % — ABNORMAL LOW (ref 38.4–49.9)
HGB: 11.1 g/dL — ABNORMAL LOW (ref 13.0–17.1)
MCH: 34 pg — ABNORMAL HIGH (ref 27.2–33.4)
MONO#: 0.7 10*3/uL (ref 0.1–0.9)
NEUT%: 72.3 % (ref 39.0–75.0)
RDW: 13.6 % (ref 11.0–14.6)
WBC: 6.7 10*3/uL (ref 4.0–10.3)
lymph#: 0.9 10*3/uL (ref 0.9–3.3)

## 2013-08-12 ENCOUNTER — Encounter (HOSPITAL_COMMUNITY): Payer: Self-pay

## 2013-08-12 ENCOUNTER — Ambulatory Visit (HOSPITAL_COMMUNITY)
Admission: RE | Admit: 2013-08-12 | Discharge: 2013-08-12 | Disposition: A | Discharge status: Home / Self Care | Payer: 59 | Source: Ambulatory Visit | Attending: Internal Medicine | Admitting: Internal Medicine

## 2013-08-12 ENCOUNTER — Encounter (INDEPENDENT_AMBULATORY_CARE_PROVIDER_SITE_OTHER): Payer: 59 | Admitting: Ophthalmology

## 2013-08-12 DIAGNOSIS — H348192 Central retinal vein occlusion, unspecified eye, stable: Secondary | ICD-10-CM

## 2013-08-12 DIAGNOSIS — Z9221 Personal history of antineoplastic chemotherapy: Secondary | ICD-10-CM | POA: Insufficient documentation

## 2013-08-12 DIAGNOSIS — H43819 Vitreous degeneration, unspecified eye: Secondary | ICD-10-CM

## 2013-08-12 DIAGNOSIS — Z483 Aftercare following surgery for neoplasm: Secondary | ICD-10-CM | POA: Insufficient documentation

## 2013-08-12 DIAGNOSIS — Z85118 Personal history of other malignant neoplasm of bronchus and lung: Secondary | ICD-10-CM | POA: Insufficient documentation

## 2013-08-12 DIAGNOSIS — Z923 Personal history of irradiation: Secondary | ICD-10-CM | POA: Insufficient documentation

## 2013-08-12 DIAGNOSIS — H35039 Hypertensive retinopathy, unspecified eye: Secondary | ICD-10-CM

## 2013-08-12 DIAGNOSIS — M8448XA Pathological fracture, other site, initial encounter for fracture: Secondary | ICD-10-CM | POA: Insufficient documentation

## 2013-08-12 DIAGNOSIS — I700 Atherosclerosis of aorta: Secondary | ICD-10-CM | POA: Insufficient documentation

## 2013-08-12 DIAGNOSIS — I10 Essential (primary) hypertension: Secondary | ICD-10-CM

## 2013-08-12 DIAGNOSIS — C3490 Malignant neoplasm of unspecified part of unspecified bronchus or lung: Secondary | ICD-10-CM

## 2013-08-12 MED ORDER — IOHEXOL 300 MG/ML  SOLN
80.0000 mL | Freq: Once | INTRAMUSCULAR | Status: AC | PRN
  Administered 2013-08-12: 80 mL via INTRAVENOUS

## 2013-08-13 ENCOUNTER — Telehealth: Payer: Self-pay | Admitting: Internal Medicine

## 2013-08-13 ENCOUNTER — Encounter: Payer: Self-pay | Admitting: Internal Medicine

## 2013-08-13 ENCOUNTER — Ambulatory Visit (HOSPITAL_BASED_OUTPATIENT_CLINIC_OR_DEPARTMENT_OTHER): Payer: 59 | Admitting: Internal Medicine

## 2013-08-13 VITALS — BP 147/84 | HR 85 | Temp 97.4°F | Resp 20 | Ht 67.0 in | Wt 187.3 lb

## 2013-08-13 DIAGNOSIS — C342 Malignant neoplasm of middle lobe, bronchus or lung: Secondary | ICD-10-CM

## 2013-08-13 DIAGNOSIS — C3430 Malignant neoplasm of lower lobe, unspecified bronchus or lung: Secondary | ICD-10-CM

## 2013-08-13 DIAGNOSIS — C3490 Malignant neoplasm of unspecified part of unspecified bronchus or lung: Secondary | ICD-10-CM

## 2013-08-13 NOTE — Telephone Encounter (Signed)
s.w. pt and advised on all March 2015 appts...pt requested to have ct done at [facility]..ok.Marland KitchenMarland Kitchen

## 2013-08-13 NOTE — Patient Instructions (Signed)
No evidence for disease recurrence on the recent scan.  Followup visit in 6 months with repeat CT scan of the chest. 

## 2013-08-13 NOTE — Progress Notes (Signed)
[facility] [phone]   [phone]  OFFICE PROGRESS NOTE  Pearla Dubonnet, MD [address] [place] [address]  DIAGNOSIS: Stage IIB/IIIA non-small cell lung cancer, squamous cell carcinoma diagnosed in February of 2012.   PRIOR THERAPY:  1) Status post a course of concurrent chemoradiation with weekly carboplatin and paclitaxel. Last dose of chemotherapy was given on 03/21/2011.  2) status post right VATS, right thoracotomy, right middle and right lower lobectomies under the care of Dr. Edwyna Shell on 06/10/2011.   CURRENT THERAPY:observation.   INTERVAL HISTORY: Todd Villanueva 72 y.o. male returns to the clinic today for six-month followup visit. The patient is feeling fine today with no specific complaints except for occasional pain in the right side of his chest. He has some unnited rib fractures at that area. He denied having any significant shortness breath except with exertion, no cough or hemoptysis. The patient denied having any significant weight loss or night sweats. He has no nausea or vomiting. The patient had repeat CT scan of the chest performed recently and he is here for evaluation and discussion of his scan results.  MEDICAL HISTORY: Past Medical History  Diagnosis Date  . Hypertension   . lung ca dx'd 02/2011    xrt/chemo comp 03/30/11, stage IIIA  . Hyperlipidemia   . 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  MEDICATIONS:  Current Outpatient Prescriptions  Medication Sig Dispense Refill  . albuterol (PROAIR HFA) 108 (90 BASE) MCG/ACT inhaler Inhale 2 puffs into the lungs every 6 (six) hours as needed for wheezing.  1 Inhaler  3  . aspirin 81 MG tablet Take 81 mg by mouth daily.        . bevacizumab (AVASTIN) 1.25 mg/0.1 mL SOLN 1.25 mg by  Intravitreal route every 30 (thirty) days.      . citalopram (CELEXA) 20 MG tablet Take 20 mg by mouth daily.        Marland Kitchen FeFum-FePoly-FA-B Cmp-C-Biot (FOLIVANE-PLUS) CAPS TAKE 1 CAPSULE BY MOUTH EVERY MORNING.  30 capsule  5  . lisinopril (PRINIVIL,ZESTRIL) 40 MG tablet Take 40 mg by mouth daily.        Marland Kitchen omeprazole (PRILOSEC) 20 MG capsule Take 20 mg by mouth daily.        . simvastatin (ZOCOR) 40 MG tablet Take 40 mg by mouth at bedtime. Takes 40mg  Mon/ Wed/Fridays only        No current facility-administered medications for this visit.    SURGICAL HISTORY:  Past Surgical History  Procedure Laterality Date  . Right vats, rt thoracotomy, rt middle and rt lower  lobectomy  06/10/2011    Dr Edwyna Shell  . Knee arthroscopy    . Tonsillectomy    . Coronary stent placement  JULY,AUGUST 2011  . Orif femoral neck fracture w/ dhs      REVIEW OF SYSTEMS:  A comprehensive review of systems was negative except for: Respiratory: positive for pleurisy/chest pain   PHYSICAL EXAMINATION: General appearance: alert, cooperative and no distress Head: Normocephalic, without obvious abnormality, atraumatic Neck: no adenopathy Lymph nodes: Cervical, supraclavicular, and axillary nodes normal. Resp: clear to auscultation bilaterally Cardio: regular rate and rhythm, S1, S2 normal, no murmur, click, rub or gallop GI: soft, non-tender; bowel sounds normal; no masses,  no organomegaly Extremities:  extremities normal, atraumatic, no cyanosis or edema  ECOG PERFORMANCE STATUS: 1 - Symptomatic but completely ambulatory  Blood pressure 147/84, pulse 85, temperature 97.4 F (36.3 C), temperature source Oral, resp. rate 20, height 5\' 7"  (1.702 m), weight 187 lb 4.8 oz (84.959 kg).  LABORATORY DATA: Lab Results  Component Value Date   WBC 6.7 08/09/2013   HGB 11.1* 08/09/2013   HCT 32.4* 08/09/2013   MCV 98.8* 08/09/2013   PLT 161 08/09/2013      Chemistry      Component Value Date/Time   NA 140 08/09/2013 0844   NA  137 07/09/2012 0730   NA 142 01/10/2012 1205   K 4.0 08/09/2013 0844   K 4.1 07/09/2012 0730   K 3.9 01/10/2012 1205   CL 107 10/15/2012 1521   CL 102 07/09/2012 0730   CL 102 01/10/2012 1205   CO2 25 08/09/2013 0844   CO2 24 07/09/2012 0730   CO2 26 01/10/2012 1205   BUN 24.8 08/09/2013 0844   BUN 21 07/09/2012 0730   BUN 27* 01/10/2012 1205   CREATININE 1.2 08/09/2013 0844   CREATININE 1.13 07/09/2012 0730   CREATININE 1.1 01/10/2012 1205      Component Value Date/Time   CALCIUM 9.4 08/09/2013 0844   CALCIUM 9.9 07/09/2012 0730   CALCIUM 8.8 01/10/2012 1205   ALKPHOS 198* 08/09/2013 0844   ALKPHOS 267* 07/09/2012 0730   ALKPHOS 271* 01/10/2012 1205   AST 27 08/09/2013 0844   AST 22 07/09/2012 0730   AST 28 01/10/2012 1205   ALT 24 08/09/2013 0844   ALT 16 07/09/2012 0730   ALT 28 01/10/2012 1205   BILITOT 0.72 08/09/2013 0844   BILITOT 0.4 07/09/2012 0730   BILITOT 0.60 01/10/2012 1205       RADIOGRAPHIC STUDIES: Ct Chest W Contrast  08/12/2013   CLINICAL DATA:  72 year old male with history of squamous cell lung cancer, right lobectomy , chemo radiation. Restaging. No new symptoms.  EXAM: CT CHEST WITH CONTRAST  TECHNIQUE: Multidetector CT imaging of the chest was performed during intravenous contrast administration.  CONTRAST:  80mL OMNIPAQUE IOHEXOL 300 MG/ML  SOLN  COMPARISON:  02/11/2013 and earlier.  FINDINGS: Stable lung volumes. Stable trachea and major airways. Mild bilateral central peribronchial thickening. Staple line along the right hilum re- identified. Right lung base consolidation with air bronchograms is stable. Small volume right pleural fluid is not significantly changed. Superimposed emphysema in both lungs.  Mildly irregular focus of opacity just above the right diaphragm is stable to diminished (series 3, image 35). No new or suspicious pulmonary opacity in the right lung. Likewise, stable left lung, with Stable mild scarring in the left costophrenic sulcus (image 47). A small subpleural density laterally on image  33 also is stable.  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 Stable left ribs. No acute or suspicious osseous finding in the chest.  Stable small retrocrural node on the right measuring 6-7 mm. Stable occasional small posterior mediastinal nodes along the distal esophagus. No increased hilar soft tissue. Prominent right paratracheal node measuring up to 11 mm short axis is stable. No new or increased mediastinal nodes.  No axillary lymphadenopathy. Negative thoracic inlet. No pericardial effusion. Atherosclerosis of the aorta and its branches, including the coronary arteries.  Stable and negative visualized upper abdominal viscera.  IMPRESSION: Stable postoperative appearance of the chest. No acute findings. No recurrent tumor or metastatic disease identified.   Electronically Signed   By: Nedra Hai  Margo Aye M.D.   On: 08/12/2013 08:43    ASSESSMENT AND PLAN: This is a very pleasant 72 years old white male with history of stage IIB/IIIa non-small cell lung cancer status post concurrent chemoradiation followed by right middle and right lower lobectomies and has been observation since July of 2002 is no evidence for disease recurrence. I discussed the scan results with the patient. I recommended for him to continue on observation with repeat CT scan of the chest in 6 months. Regarding the brain on the right side of the chest this could be related to the ununited chronic rib fractures. I advise The patient tolerated any heavy lifting. He was advised to call immediately if he has any concerning symptoms in the interval. The patient voices understanding of current disease status and treatment options and is in agreement with the current care plan.  All questions were answered. The patient knows to call the clinic with any problems, questions or concerns. We can certainly see the patient much sooner if necessary.

## 2013-08-30 ENCOUNTER — Other Ambulatory Visit: Payer: Self-pay | Admitting: Internal Medicine

## 2013-08-30 DIAGNOSIS — C3490 Malignant neoplasm of unspecified part of unspecified bronchus or lung: Secondary | ICD-10-CM

## 2013-09-06 ENCOUNTER — Other Ambulatory Visit: Payer: Self-pay | Admitting: *Deleted

## 2013-09-06 DIAGNOSIS — C3490 Malignant neoplasm of unspecified part of unspecified bronchus or lung: Secondary | ICD-10-CM

## 2013-09-06 MED ORDER — FOLIVANE-PLUS PO CAPS: 1.0000 | ORAL_CAPSULE | Freq: Every day | ORAL | Status: DC

## 2013-09-09 ENCOUNTER — Encounter (INDEPENDENT_AMBULATORY_CARE_PROVIDER_SITE_OTHER): Payer: 59 | Admitting: Ophthalmology

## 2013-09-09 DIAGNOSIS — H35039 Hypertensive retinopathy, unspecified eye: Secondary | ICD-10-CM

## 2013-09-09 DIAGNOSIS — H43819 Vitreous degeneration, unspecified eye: Secondary | ICD-10-CM

## 2013-09-09 DIAGNOSIS — I10 Essential (primary) hypertension: Secondary | ICD-10-CM

## 2013-09-09 DIAGNOSIS — H348192 Central retinal vein occlusion, unspecified eye, stable: Secondary | ICD-10-CM

## 2013-10-03 ENCOUNTER — Encounter (INDEPENDENT_AMBULATORY_CARE_PROVIDER_SITE_OTHER): Payer: 59 | Admitting: Ophthalmology

## 2013-10-03 DIAGNOSIS — H35039 Hypertensive retinopathy, unspecified eye: Secondary | ICD-10-CM

## 2013-10-03 DIAGNOSIS — I10 Essential (primary) hypertension: Secondary | ICD-10-CM

## 2013-10-03 DIAGNOSIS — H43819 Vitreous degeneration, unspecified eye: Secondary | ICD-10-CM

## 2013-10-03 DIAGNOSIS — H348192 Central retinal vein occlusion, unspecified eye, stable: Secondary | ICD-10-CM

## 2013-10-10 ENCOUNTER — Other Ambulatory Visit: Payer: Self-pay

## 2013-10-14 ENCOUNTER — Ambulatory Visit (INDEPENDENT_AMBULATORY_CARE_PROVIDER_SITE_OTHER): Payer: 59 | Admitting: Ophthalmology

## 2013-10-14 ENCOUNTER — Encounter (INDEPENDENT_AMBULATORY_CARE_PROVIDER_SITE_OTHER): Payer: 59 | Admitting: Ophthalmology

## 2013-11-01 ENCOUNTER — Telehealth: Payer: Self-pay | Admitting: Internal Medicine

## 2013-11-01 NOTE — Telephone Encounter (Signed)
returned pt call and advised on all March 2015 appts

## 2013-11-05 ENCOUNTER — Encounter (INDEPENDENT_AMBULATORY_CARE_PROVIDER_SITE_OTHER): Payer: 59 | Admitting: Ophthalmology

## 2013-11-05 DIAGNOSIS — H348192 Central retinal vein occlusion, unspecified eye, stable: Secondary | ICD-10-CM

## 2013-11-05 DIAGNOSIS — H43819 Vitreous degeneration, unspecified eye: Secondary | ICD-10-CM

## 2013-11-05 DIAGNOSIS — H35039 Hypertensive retinopathy, unspecified eye: Secondary | ICD-10-CM

## 2013-11-05 DIAGNOSIS — I10 Essential (primary) hypertension: Secondary | ICD-10-CM

## 2013-11-13 ENCOUNTER — Telehealth: Payer: Self-pay | Admitting: Pulmonary Disease

## 2013-11-13 NOTE — Telephone Encounter (Signed)
Spoke with patient- consult appointment made for 01-07-14 at 2:15pm.

## 2013-11-20 ENCOUNTER — Ambulatory Visit
Admission: RE | Admit: 2013-11-20 | Discharge: 2013-11-20 | Disposition: A | Discharge status: Home / Self Care | Payer: 59 | Source: Ambulatory Visit | Attending: Nurse Practitioner | Admitting: Nurse Practitioner

## 2013-11-20 ENCOUNTER — Other Ambulatory Visit: Payer: Self-pay | Admitting: Nurse Practitioner

## 2013-11-20 DIAGNOSIS — J441 Chronic obstructive pulmonary disease with (acute) exacerbation: Secondary | ICD-10-CM

## 2013-12-16 ENCOUNTER — Inpatient Hospital Stay (HOSPITAL_COMMUNITY)
Admission: EM | Admit: 2013-12-16 | Discharge: 2013-12-20 | DRG: 192 | Disposition: A | Discharge status: Home / Self Care | Payer: 59 | Attending: Internal Medicine | Admitting: Internal Medicine

## 2013-12-16 ENCOUNTER — Emergency Department (HOSPITAL_COMMUNITY): Payer: 59

## 2013-12-16 ENCOUNTER — Inpatient Hospital Stay: Admission: AD | Admit: 2013-12-16 | Payer: 59 | Source: Ambulatory Visit | Admitting: Internal Medicine

## 2013-12-16 ENCOUNTER — Encounter (HOSPITAL_COMMUNITY): Payer: Self-pay | Admitting: Emergency Medicine

## 2013-12-16 DIAGNOSIS — J439 Emphysema, unspecified: Secondary | ICD-10-CM

## 2013-12-16 DIAGNOSIS — Z923 Personal history of irradiation: Secondary | ICD-10-CM

## 2013-12-16 DIAGNOSIS — Z85118 Personal history of other malignant neoplasm of bronchus and lung: Secondary | ICD-10-CM

## 2013-12-16 DIAGNOSIS — E785 Hyperlipidemia, unspecified: Secondary | ICD-10-CM | POA: Diagnosis present

## 2013-12-16 DIAGNOSIS — G4733 Obstructive sleep apnea (adult) (pediatric): Secondary | ICD-10-CM | POA: Diagnosis present

## 2013-12-16 DIAGNOSIS — J329 Chronic sinusitis, unspecified: Secondary | ICD-10-CM | POA: Diagnosis present

## 2013-12-16 DIAGNOSIS — Z87891 Personal history of nicotine dependence: Secondary | ICD-10-CM

## 2013-12-16 DIAGNOSIS — Z9861 Coronary angioplasty status: Secondary | ICD-10-CM

## 2013-12-16 DIAGNOSIS — J440 Chronic obstructive pulmonary disease with acute lower respiratory infection: Principal | ICD-10-CM | POA: Diagnosis present

## 2013-12-16 DIAGNOSIS — J441 Chronic obstructive pulmonary disease with (acute) exacerbation: Secondary | ICD-10-CM | POA: Diagnosis present

## 2013-12-16 DIAGNOSIS — G4700 Insomnia, unspecified: Secondary | ICD-10-CM | POA: Diagnosis present

## 2013-12-16 DIAGNOSIS — I10 Essential (primary) hypertension: Secondary | ICD-10-CM | POA: Diagnosis present

## 2013-12-16 DIAGNOSIS — J449 Chronic obstructive pulmonary disease, unspecified: Secondary | ICD-10-CM | POA: Insufficient documentation

## 2013-12-16 DIAGNOSIS — J209 Acute bronchitis, unspecified: Principal | ICD-10-CM | POA: Diagnosis present

## 2013-12-16 DIAGNOSIS — F411 Generalized anxiety disorder: Secondary | ICD-10-CM | POA: Diagnosis present

## 2013-12-16 DIAGNOSIS — C3431 Malignant neoplasm of lower lobe, right bronchus or lung: Secondary | ICD-10-CM | POA: Diagnosis present

## 2013-12-16 LAB — GLUCOSE, CAPILLARY: Glucose-Capillary: 182 mg/dL — ABNORMAL HIGH (ref 70–99)

## 2013-12-16 LAB — COMPREHENSIVE METABOLIC PANEL
ALK PHOS: 222 U/L — AB (ref 39–117)
ALT: 77 U/L — ABNORMAL HIGH (ref 0–53)
AST: 41 U/L — ABNORMAL HIGH (ref 0–37)
Albumin: 3.5 g/dL (ref 3.5–5.2)
BUN: 34 mg/dL — ABNORMAL HIGH (ref 6–23)
CO2: 23 meq/L (ref 19–32)
Calcium: 9.8 mg/dL (ref 8.4–10.5)
Chloride: 100 mEq/L (ref 96–112)
Creatinine, Ser: 1.41 mg/dL — ABNORMAL HIGH (ref 0.50–1.35)
GFR, EST AFRICAN AMERICAN: 57 mL/min — AB (ref >90)
GFR, EST NON AFRICAN AMERICAN: 49 mL/min — AB (ref >90)
GLUCOSE: 103 mg/dL — AB (ref 70–99)
POTASSIUM: 4.8 meq/L (ref 3.7–5.3)
Sodium: 138 mEq/L (ref 137–147)
Total Bilirubin: 0.5 mg/dL (ref 0.3–1.2)
Total Protein: 7.6 g/dL (ref 6.0–8.3)

## 2013-12-16 LAB — CBC WITH DIFFERENTIAL/PLATELET
Basophils Absolute: 0 10*3/uL (ref 0.0–0.1)
Basophils Relative: 0 % (ref 0–1)
Eosinophils Absolute: 0 10*3/uL (ref 0.0–0.7)
Eosinophils Relative: 0 % (ref 0–5)
HCT: 36.9 % — ABNORMAL LOW (ref 39.0–52.0)
HEMOGLOBIN: 12.5 g/dL — AB (ref 13.0–17.0)
LYMPHS ABS: 0.6 10*3/uL — AB (ref 0.7–4.0)
LYMPHS PCT: 4 % — AB (ref 12–46)
MCH: 33.4 pg (ref 26.0–34.0)
MCHC: 33.9 g/dL (ref 30.0–36.0)
MCV: 98.7 fL (ref 78.0–100.0)
Monocytes Absolute: 0.5 10*3/uL (ref 0.1–1.0)
Monocytes Relative: 3 % (ref 3–12)
NEUTROS PCT: 92 % — AB (ref 43–77)
Neutro Abs: 12.2 10*3/uL — ABNORMAL HIGH (ref 1.7–7.7)
PLATELETS: 193 10*3/uL (ref 150–400)
RBC: 3.74 MIL/uL — AB (ref 4.22–5.81)
RDW: 13.7 % (ref 11.5–15.5)
WBC: 13.3 10*3/uL — AB (ref 4.0–10.5)

## 2013-12-16 LAB — TROPONIN I: Troponin I: <0.3 ng/mL (ref <0.30)

## 2013-12-16 MED ORDER — HYDROMORPHONE HCL PF 1 MG/ML IJ SOLN: 1.0000 mg | INTRAMUSCULAR | Status: DC | PRN

## 2013-12-16 MED ORDER — ASPIRIN EC 81 MG PO TBEC
81.0000 mg | DELAYED_RELEASE_TABLET | Freq: Every day | ORAL | Status: DC
  Administered 2013-12-17 – 2013-12-19 (×3): 81 mg via ORAL
  Filled 2013-12-16 (×4): qty 1

## 2013-12-16 MED ORDER — DM-GUAIFENESIN ER 30-600 MG PO TB12
1.0000 | ORAL_TABLET | Freq: Two times a day (BID) | ORAL | Status: DC
  Administered 2013-12-16 – 2013-12-19 (×7): 1 via ORAL
  Filled 2013-12-16 (×9): qty 1

## 2013-12-16 MED ORDER — TRAZODONE HCL 50 MG PO TABS
50.0000 mg | ORAL_TABLET | Freq: Every evening | ORAL | Status: DC | PRN
  Administered 2013-12-16 – 2013-12-19 (×4): 50 mg via ORAL
  Filled 2013-12-16 (×4): qty 1

## 2013-12-16 MED ORDER — FOLIVANE-PLUS PO CAPS: 1.0000 | ORAL_CAPSULE | Freq: Every day | ORAL | Status: DC

## 2013-12-16 MED ORDER — PANTOPRAZOLE SODIUM 40 MG PO TBEC
40.0000 mg | DELAYED_RELEASE_TABLET | Freq: Every day | ORAL | Status: DC
Start: 2013-12-17 — End: 2013-12-20
  Administered 2013-12-17 – 2013-12-19 (×3): 40 mg via ORAL
  Filled 2013-12-16 (×3): qty 1

## 2013-12-16 MED ORDER — IRBESARTAN 150 MG PO TABS
150.0000 mg | ORAL_TABLET | Freq: Every day | ORAL | Status: DC
  Administered 2013-12-17 – 2013-12-19 (×3): 150 mg via ORAL
  Filled 2013-12-16 (×4): qty 1

## 2013-12-16 MED ORDER — METHYLPREDNISOLONE SODIUM SUCC 125 MG IJ SOLR
60.0000 mg | Freq: Two times a day (BID) | INTRAMUSCULAR | Status: DC
  Administered 2013-12-16 – 2013-12-17 (×2): 60 mg via INTRAVENOUS
  Filled 2013-12-16: qty 0.96
  Filled 2013-12-16: qty 2
  Filled 2013-12-16 (×2): qty 0.96

## 2013-12-16 MED ORDER — DOXYCYCLINE HYCLATE 100 MG PO TABS
100.0000 mg | ORAL_TABLET | Freq: Two times a day (BID) | ORAL | Status: DC
  Administered 2013-12-16 (×2): 100 mg via ORAL
  Filled 2013-12-16 (×4): qty 1

## 2013-12-16 MED ORDER — ALBUTEROL SULFATE (2.5 MG/3ML) 0.083% IN NEBU
5.0000 mg | INHALATION_SOLUTION | Freq: Once | RESPIRATORY_TRACT | Status: AC
  Administered 2013-12-16: 5 mg via RESPIRATORY_TRACT
  Filled 2013-12-16: qty 6

## 2013-12-16 MED ORDER — PREDNISONE 50 MG PO TABS
60.0000 mg | ORAL_TABLET | Freq: Once | ORAL | Status: DC
Start: 2013-12-16 — End: 2013-12-17

## 2013-12-16 MED ORDER — ACETAMINOPHEN 325 MG PO TABS
650.0000 mg | ORAL_TABLET | Freq: Four times a day (QID) | ORAL | Status: DC | PRN
  Administered 2013-12-17: 650 mg via ORAL
  Filled 2013-12-16: qty 2

## 2013-12-16 MED ORDER — SIMVASTATIN 40 MG PO TABS
40.0000 mg | ORAL_TABLET | ORAL | Status: DC
  Administered 2013-12-18: 10:00:00 40 mg via ORAL
  Filled 2013-12-16 (×2): qty 1

## 2013-12-16 MED ORDER — ONDANSETRON HCL 4 MG PO TABS: 4.0000 mg | ORAL_TABLET | Freq: Four times a day (QID) | ORAL | Status: DC | PRN

## 2013-12-16 MED ORDER — CITALOPRAM HYDROBROMIDE 20 MG PO TABS
20.0000 mg | ORAL_TABLET | Freq: Every day | ORAL | Status: DC
  Administered 2013-12-17 – 2013-12-19 (×3): 20 mg via ORAL
  Filled 2013-12-16 (×4): qty 1

## 2013-12-16 MED ORDER — FOLIVANE-PLUS PO CAPS
1.0000 | ORAL_CAPSULE | Freq: Every day | ORAL | Status: DC
Start: 2013-12-17 — End: 2013-12-16

## 2013-12-16 MED ORDER — LEVALBUTEROL HCL 0.63 MG/3ML IN NEBU
0.6300 mg | INHALATION_SOLUTION | RESPIRATORY_TRACT | Status: DC
  Administered 2013-12-16 – 2013-12-19 (×12): 0.63 mg via RESPIRATORY_TRACT
  Filled 2013-12-16 (×30): qty 3

## 2013-12-16 MED ORDER — LISINOPRIL 40 MG PO TABS: 40.0000 mg | ORAL_TABLET | Freq: Every day | ORAL | Status: DC

## 2013-12-16 MED ORDER — ENOXAPARIN SODIUM 40 MG/0.4ML ~~LOC~~ SOLN
40.0000 mg | SUBCUTANEOUS | Status: DC
  Administered 2013-12-16 – 2013-12-19 (×4): 40 mg via SUBCUTANEOUS
  Filled 2013-12-16 (×5): qty 0.4

## 2013-12-16 MED ORDER — ACETAMINOPHEN 650 MG RE SUPP: 650.0000 mg | Freq: Four times a day (QID) | RECTAL | Status: DC | PRN

## 2013-12-16 MED ORDER — HYDROMORPHONE HCL 2 MG PO TABS: 2.0000 mg | ORAL_TABLET | ORAL | Status: DC | PRN

## 2013-12-16 MED ORDER — SENNOSIDES-DOCUSATE SODIUM 8.6-50 MG PO TABS
1.0000 | ORAL_TABLET | Freq: Every evening | ORAL | Status: DC | PRN
  Filled 2013-12-16: qty 1

## 2013-12-16 MED ORDER — HYDROMORPHONE HCL PF 1 MG/ML IJ SOLN
0.5000 mg | Freq: Once | INTRAMUSCULAR | Status: AC
  Administered 2013-12-16: 0.5 mg via INTRAVENOUS
  Filled 2013-12-16: qty 1

## 2013-12-16 MED ORDER — ONDANSETRON HCL 4 MG/2ML IJ SOLN: 4.0000 mg | Freq: Four times a day (QID) | INTRAMUSCULAR | Status: DC | PRN

## 2013-12-16 MED ORDER — ALPRAZOLAM 0.25 MG PO TABS
0.2500 mg | ORAL_TABLET | ORAL | Status: DC | PRN
  Administered 2013-12-17 – 2013-12-19 (×3): 0.25 mg via ORAL
  Filled 2013-12-16 (×3): qty 1

## 2013-12-16 MED ORDER — PRENATAL MULTIVITAMIN CH
1.0000 | ORAL_TABLET | Freq: Every day | ORAL | Status: DC
  Administered 2013-12-17 – 2013-12-19 (×3): 1 via ORAL
  Filled 2013-12-16 (×4): qty 1

## 2013-12-16 MED ORDER — IPRATROPIUM BROMIDE 0.02 % IN SOLN
0.5000 mg | Freq: Four times a day (QID) | RESPIRATORY_TRACT | Status: DC
  Administered 2013-12-16 – 2013-12-19 (×11): 0.5 mg via RESPIRATORY_TRACT
  Filled 2013-12-16 (×11): qty 2.5

## 2013-12-16 NOTE — ED Notes (Signed)
Patient is seen by Dr [name].  He has hx of lung cancer and surgery.  He has been experiencing increasing sob for the past 45 days.  Patient has been on multiple rounds of steriods.  Patient also had a round of antibiotics.  Patient also has hx of fx ribs.  Patient is sob walking and at rest.  He has audible wheezing at bedside.  Initial sat was 90 percent on room air. It has not improved to 96% when resting and not talking.  Patient md came by and saw him this morning and advised that he come to ED for admission.  No tele beds available at this time.  ERMD aware of same.

## 2013-12-16 NOTE — ED Notes (Signed)
Pt refused breathing treatment when offered by ED MD

## 2013-12-16 NOTE — ED Notes (Signed)
Continues to wheeze, denies SOB. POX >97% RA. Resp e/u, no distress. Pt stated he will call out when he feels like he needs a breathing treatment.

## 2013-12-16 NOTE — Progress Notes (Signed)
Patient telemetry discontinued per MD order.  Central monitoring notified. Shellee Milo, RN

## 2013-12-16 NOTE — H&P (Signed)
[facility] History and Physical  Todd Villanueva WRU:[id number] DOB: Nov 25, 1941 DOA: 12/16/2013  Referring physician: Alvino Chapel PCP: [name], MD   Chief Complaint: cough, dyspnea  HPI: Todd Villanueva is a 73 y.o. male  With h/o COPD, lung cancer sent to ED by PCP. C/o worsening cough, dyspnea. Symptoms started 45 days ago. Received 5 day course avelox, several courses of prednisone. Had fever/diaphoresis 7 days ago. None now. Initially, sputum clear, now green. Weak, pleuritic CP. Nebs at home not helping.   Review of Systems:  Systems reviewed. As above. Otherwise negative  Past Medical History  Diagnosis Date  . Hypertension   . lung ca dx'd 02/2011    xrt/chemo comp 03/30/11, stage IIIA  . Hyperlipidemia   . 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 Right vats, rt thoracotomy, rt middle and rt lower  lobectomy  06/10/2011    Dr Arlyce Dice  . Knee arthroscopy    . Tonsillectomy    . Coronary stent placement  JULY,AUGUST 2011  . Orif femoral neck fracture w/ dhs     Social History:  reports that he quit smoking about 2 years ago. His smoking use included Cigarettes. He has a 26.5 pack-year smoking history. He has never used smokeless tobacco. He reports that he drinks alcohol. He reports that he does not use illicit drugs.Works at [employer] Reactions  . Hydrocodone-Acetaminophen     REACTION: Itch  . Oxycodone Itching  . Zolpidem Tartrate Other (See Comments)    nightmares    Family History  Problem Relation Age of Onset  . Cancer Father     multiple myeloma  . COPD Sister     emphysema  . Cancer Brother     multiple myeloma     Prior to Admission medications   Medication Sig Start Date End Date Taking? Authorizing Provider  albuterol (PROVENTIL HFA;VENTOLIN HFA) 108 (90 BASE) MCG/ACT inhaler Inhale 2  puffs into the lungs every 4 (four) hours as needed for wheezing or shortness of breath.   Yes Historical Provider, MD  ALPRAZolam Duanne Moron) 0.25 MG tablet Take 0.25 mg by mouth as needed for anxiety (prior to opthalmic injections.).   Yes Historical Provider, MD  aspirin 81 MG tablet Take 81 mg by mouth daily.     Yes Historical Provider, MD  bevacizumab (AVASTIN) 1.25 mg/0.1 mL SOLN 1.25 mg by Intravitreal route every 30 (thirty) days.   Yes Hayden Pedro, MD  citalopram (CELEXA) 20 MG tablet Take 20 mg by mouth daily.     Yes Historical Provider, MD  FeFum-FePoly-FA-B Cmp-C-Biot (FOLIVANE-PLUS) CAPS Take 1 capsule by mouth daily. 09/06/13  Yes [name], MD  levalbuterol Penne Lash) 0.63 MG/3ML nebulizer solution Take 0.63 mg by nebulization 3 (three) times daily as needed for wheezing or shortness of breath.   Yes Historical Provider, MD  olmesartan (BENICAR) 20 MG tablet Take 20 mg by mouth daily.   Yes Historical Provider, MD  omeprazole (PRILOSEC) 20 MG capsule Take 20 mg by mouth daily.     Yes Historical Provider, MD  predniSONE (DELTASONE) 10 MG tablet Take 10-60 mg by mouth daily with breakfast. 60 mg X 2 days, 50 mg X 2 days, 40 mg X 2 days, 30 mg X 2 days, 20 mg X 2 days, 10 mg X 2 days. Filled  on 12-11-13   Yes Historical Provider, MD  simvastatin (ZOCOR) 40 MG tablet Take 40 mg by mouth every Monday, Wednesday, and Friday.    Yes Historical Provider, MD  lisinopril (PRINIVIL,ZESTRIL) 40 MG tablet Take 40 mg by mouth daily.      Historical Provider, MD   Physical Exam: Filed Vitals:   12/16/13 1449  BP: 135/70  Pulse: 114  Temp:   Resp: 22    BP 135/70  Pulse 114  Temp(Src) 98 F (36.7 C) (Oral)  Resp 22  Ht 5' 7"  (1.702 m)  Wt 88.451 kg (195 lb)  BMI 30.53 kg/m2  SpO2 100%  BP 158/76  Pulse 103  Temp(Src) 97.5 F (36.4 C) (Oral)  Resp 22  Ht 5' 7"  (1.702 m)  Wt 82.6 kg (182 lb 1.6 oz)  BMI 28.51 kg/m2  SpO2 97%  General Appearance:    Alert, cooperative,  uncomfortable. Truncated sentences. Frequent coughing spells.  Head:    Normocephalic, without obvious abnormality, atraumatic  Eyes:    PERRL, conjunctiva/corneas clear, EOM's intact, fundi    benign, both eyes          Nose:   Nares normal, septum midline, mucosa normal, no drainage   or sinus tenderness  Throat:   Lips, mucosa, and tongue normal; teeth and gums normal  Neck:   Supple, symmetrical, trachea midline, no adenopathy;       thyroid:  No enlargement/tenderness/nodules; no carotid   bruit or JVD  Back:     Symmetric, no curvature, ROM normal, no CVA tenderness  Lungs:     Bilateral wheeze and rhonchi, no rales  Chest wall:    No tenderness or deformity  Heart:    Regular rate and rhythm, S1 and S2 normal, no murmur, rub   or gallop  Abdomen:     Soft, non-tender, bowel sounds active all four quadrants,    no masses, no organomegaly  Genitalia:   deferred  Rectal:   deferred  Extremities:   Extremities normal, atraumatic, no cyanosis or edema  Pulses:   2+ and symmetric all extremities  Skin:   Skin color, texture, turgor normal, no rashes or lesions  Lymph nodes:   Cervical, supraclavicular, and axillary nodes normal  Neurologic:   CNII-XII intact. Normal strength, sensation and reflexes      throughout             Psych: normal affect  Labs on Admission:  Basic Metabolic Panel:  Recent Labs Lab 12/16/13 1100  NA 138  K 4.8  CL 100  CO2 23  GLUCOSE 103*  BUN 34*  CREATININE 1.41*  CALCIUM 9.8   Liver Function Tests:  Recent Labs Lab 12/16/13 1100  AST 41*  ALT 77*  ALKPHOS 222*  BILITOT 0.5  PROT 7.6  ALBUMIN 3.5   No results found for this basename: LIPASE, AMYLASE,  in the last 168 hours No results found for this basename: AMMONIA,  in the last 168 hours CBC:  Recent Labs Lab 12/16/13 1100  WBC 13.3*  NEUTROABS 12.2*  HGB 12.5*  HCT 36.9*  MCV 98.7  PLT 193   Cardiac Enzymes:  Recent Labs Lab 12/16/13 1100  TROPONINI <0.30     BNP (last 3 results) No results found for this basename: PROBNP,  in the last 8760 hours CBG: No results found for this basename: GLUCAP,  in the last 168 hours  Radiological Exams on Admission: Dg Chest 2 View  12/16/2013   CLINICAL  DATA:  Short of breath, lung cancer  EXAM: CHEST  2 VIEW  COMPARISON:  DG CHEST 2 VIEW dated 11/20/2013; CT CHEST W/CM dated 08/12/2013  FINDINGS: Stable cardiac silhouette. There is volume loss in the right hemi thorax consistent with prior surgery. There is right lower lobe atelectasis and mild effusion. Left lung is clear. Posterior right thoracotomy rib changes noted.  IMPRESSION: 1. Stable postop unchanged in the right hemi thorax with right basilar atelectasis small effusion. 2. Left lung is clear. 3. No change from prior.   Electronically Signed   By: Suzy Bouchard M.D.   On: 12/16/2013 10:51    Assessment/Plan Principal Problem:   COPD exacerbation Active Problems:   CARCINOMA, LUNG, SQUAMOUS CELL   OSA (obstructive sleep apnea)   Acute bronchitis  IV steroids, HHN, mucolytics, doxycycline, monitor CBGs. Medsurg.  Discussed with Dr. [name]  Time spent: 60 min  [facility] Pager [phone]

## 2013-12-16 NOTE — ED Provider Notes (Signed)
CSN: [id number]     Arrival date & time 12/16/13  4098 History   First MD Initiated Contact with Patient 12/16/13 [phone]     Chief Complaint  Patient presents with  . Shortness of Breath  . Chest Pain   (Consider location/radiation/quality/duration/timing/severity/associated sxs/prior Treatment) Patient is a 73 y.o. male presenting with shortness of breath and chest pain. The history is provided by the patient.  Shortness of Breath Associated symptoms: chest pain and wheezing   Associated symptoms: no abdominal pain, no headaches, no rash and no vomiting   Chest Pain Associated symptoms: shortness of breath   Associated symptoms: no abdominal pain, no back pain, no headache, no nausea, no numbness, not vomiting and no weakness    patient's had worsening shortness of breath over the last 45 days. Primary care doctor's been managing him. He has a history of COPD and emphysema. He has a previous history of lung cancer and has had a lobectomy on the right. He's been on steroids antibiotics about the last month and a half. He was sent in for admission to his primary care Dr. There are no telemetry beds on the floor, so he was sent to the ED. He denies chest pain. He has a cough. He states he has nurse taking albuterol. No fevers. No swelling of his legs. No chest pain.  Past Medical History  Diagnosis Date  . Hypertension   . lung ca dx'd 02/2011    xrt/chemo comp 03/30/11, stage IIIA  . Hyperlipidemia   . 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 Right vats, rt thoracotomy, rt middle and rt lower  lobectomy  06/10/2011    Dr Arlyce Dice  . Knee arthroscopy    . Tonsillectomy    . Coronary stent placement  JULY,AUGUST 2011  . Orif femoral neck fracture w/ dhs     Family History  Problem Relation Age of Onset  . Cancer Father     multiple myeloma  . COPD Sister      emphysema  . Cancer Brother     multiple myeloma   History  Substance Use Topics  . Smoking status: Former Smoker -- 0.50 packs/day for 53 years    Types: Cigarettes    Quit date: 01/05/2011  . Smokeless tobacco: Never Used     Comment: last smoked 6 weeks ago  . Alcohol Use: Yes     Comment: occasionally    Review of Systems  Constitutional: Negative for activity change and appetite change.  Eyes: Negative for pain.  Respiratory: Positive for shortness of breath and wheezing. Negative for chest tightness.   Cardiovascular: Positive for chest pain. Negative for leg swelling.  Gastrointestinal: Negative for nausea, vomiting, abdominal pain and diarrhea.  Genitourinary: Negative for flank pain.  Musculoskeletal: Negative for back pain and neck stiffness.  Skin: Negative for rash.  Neurological: Negative for weakness, numbness and headaches.  Psychiatric/Behavioral: Negative for behavioral problems.    Allergies  Hydrocodone-acetaminophen; Oxycodone; and Zolpidem tartrate  Home Medications   Current Outpatient Rx  Name  Route  Sig  Dispense  Refill  . albuterol (PROVENTIL HFA;VENTOLIN HFA) 108 (90 BASE) MCG/ACT inhaler   Inhalation   Inhale 2 puffs into the lungs every 4 (four) hours as needed for wheezing or shortness of breath.         . ALPRAZolam Duanne Moron)  0.25 MG tablet   Oral   Take 0.25 mg by mouth as needed for anxiety (prior to opthalmic injections.).         Marland Kitchen aspirin 81 MG tablet   Oral   Take 81 mg by mouth daily.           . bevacizumab (AVASTIN) 1.25 mg/0.1 mL SOLN   Intravitreal   1.25 mg by Intravitreal route every 30 (thirty) days.         . citalopram (CELEXA) 20 MG tablet   Oral   Take 20 mg by mouth daily.           Marland Kitchen FeFum-FePoly-FA-B Cmp-C-Biot (FOLIVANE-PLUS) CAPS   Oral   Take 1 capsule by mouth daily.   90 capsule   1   . levalbuterol (XOPENEX) 0.63 MG/3ML nebulizer solution   Nebulization   Take 0.63 mg by nebulization 3  (three) times daily as needed for wheezing or shortness of breath.         . olmesartan (BENICAR) 20 MG tablet   Oral   Take 20 mg by mouth daily.         Marland Kitchen omeprazole (PRILOSEC) 20 MG capsule   Oral   Take 20 mg by mouth daily.           . predniSONE (DELTASONE) 10 MG tablet   Oral   Take 10-60 mg by mouth daily with breakfast. 60 mg X 2 days, 50 mg X 2 days, 40 mg X 2 days, 30 mg X 2 days, 20 mg X 2 days, 10 mg X 2 days. Filled on 12-11-13         . simvastatin (ZOCOR) 40 MG tablet   Oral   Take 40 mg by mouth every Monday, Wednesday, and Friday.          Marland Kitchen lisinopril (PRINIVIL,ZESTRIL) 40 MG tablet   Oral   Take 40 mg by mouth daily.            BP 125/75  Pulse 114  Temp(Src) 98 F (36.7 C) (Oral)  Resp 22  Ht _0  (1.702 m)  Wt 195 lb (88.451 kg)  BMI 30.53 kg/m2  SpO2 100% Physical Exam  Nursing note and vitals reviewed. Constitutional: He is oriented to person, place, and time. He appears well-developed and well-nourished.  HENT:  Head: Normocephalic and atraumatic.  Eyes: EOM are normal. Pupils are equal, round, and reactive to light.  Neck: Normal range of motion. Neck supple.  Cardiovascular: Normal rate, regular rhythm and normal heart sounds.   No murmur heard. Pulmonary/Chest:  Some dyspnea, but he will speak full sentences. Tachypnea with mild wheezes.  Abdominal: Soft. Bowel sounds are normal. He exhibits no distension and no mass. There is no tenderness. There is no rebound and no guarding.  Musculoskeletal: Normal range of motion. He exhibits no edema.  Neurological: He is alert and oriented to person, place, and time. No cranial nerve deficit.  Skin: Skin is warm and dry.  Psychiatric: He has a normal mood and affect.    ED Course  Procedures (including critical care time) Labs Review Labs Reviewed  CBC WITH DIFFERENTIAL - Abnormal; Notable for the following:    WBC 13.3 (*)    RBC 3.74 (*)    Hemoglobin 12.5 (*)    HCT 36.9 (*)     Neutrophils Relative % 92 (*)    Neutro Abs 12.2 (*)    Lymphocytes Relative 4 (*)    Lymphs  Abs 0.6 (*)    All other components within normal limits  COMPREHENSIVE METABOLIC PANEL - Abnormal; Notable for the following:    Glucose, Bld 103 (*)    BUN 34 (*)    Creatinine, Ser 1.41 (*)    AST 41 (*)    ALT 77 (*)    Alkaline Phosphatase 222 (*)    GFR calc non Af Amer 49 (*)    GFR calc Af Amer 57 (*)    All other components within normal limits  TROPONIN I   Imaging Review Dg Chest 2 View  12/16/2013   CLINICAL DATA:  Short of breath, lung cancer  EXAM: CHEST  2 VIEW  COMPARISON:  DG CHEST 2 VIEW dated 11/20/2013; CT CHEST W/CM dated 08/12/2013  FINDINGS: Stable cardiac silhouette. There is volume loss in the right hemi thorax consistent with prior surgery. There is right lower lobe atelectasis and mild effusion. Left lung is clear. Posterior right thoracotomy rib changes noted.  IMPRESSION: 1. Stable postop unchanged in the right hemi thorax with right basilar atelectasis small effusion. 2. Left lung is clear. 3. No change from prior.   Electronically Signed   By: Suzy Bouchard M.D.   On: 12/16/2013 10:51    EKG Interpretation    Date/Time:  Monday December 16 2013 09:47:54 EST Ventricular Rate:  97 PR Interval:  169 QRS Duration: 95 QT Interval:  344 QTC Calculation: 437 R Axis:   2 Text Interpretation:  Sinus rhythm Abnormal R-wave progression, early transition No significant change since last tracing Confirmed by Aniesa Boback  MD, Donavon Kimrey ([id number]) on 12/16/2013 11:18:58 AM            MDM   1. COPD with emphysema    Patient with shortness of breath over the last 45 days. Sent from primary care Dr. for admission, however there were no beds. EKG is stable. Enzymes are stable. Will admit to internal medicine    Jasper Riling. Alvino Chapel, MD 12/16/13 1555

## 2013-12-16 NOTE — Progress Notes (Signed)
Spoke with pt regarding cpap.  Pt stated he was supposed to get tested for a machine but never did.  Pt said he doesn't wear it at home and doesn't want to try it tonight.  Pt was advised that RT is available all night should he change his mind.

## 2013-12-16 NOTE — ED Notes (Addendum)
Denies fever, chills. Reports Dx with rib fractures, c/o pain bilateral ribs which worsens with coughing. No pedal edema seen. Saw PCP last week & currently taking steroids.  Treatment for lung Ca with chemo & radiation every 6 months, next due in March

## 2013-12-16 NOTE — ED Notes (Signed)
Patient transported to X-ray 

## 2013-12-17 ENCOUNTER — Encounter (INDEPENDENT_AMBULATORY_CARE_PROVIDER_SITE_OTHER): Payer: 59 | Admitting: Ophthalmology

## 2013-12-17 LAB — GLUCOSE, CAPILLARY
GLUCOSE-CAPILLARY: 128 mg/dL — AB (ref 70–99)
Glucose-Capillary: 203 mg/dL — ABNORMAL HIGH (ref 70–99)

## 2013-12-17 MED ORDER — TEMAZEPAM 15 MG PO CAPS
30.0000 mg | ORAL_CAPSULE | Freq: Every evening | ORAL | Status: DC | PRN
  Administered 2013-12-17 – 2013-12-18 (×2): 30 mg via ORAL
  Filled 2013-12-17 (×2): qty 2

## 2013-12-17 MED ORDER — METHYLPREDNISOLONE SODIUM SUCC 125 MG IJ SOLR
60.0000 mg | Freq: Two times a day (BID) | INTRAMUSCULAR | Status: DC
Start: 2013-12-17 — End: 2013-12-17

## 2013-12-17 MED ORDER — METHYLPREDNISOLONE SODIUM SUCC 125 MG IJ SOLR
80.0000 mg | Freq: Two times a day (BID) | INTRAMUSCULAR | Status: DC
  Administered 2013-12-17 – 2013-12-19 (×5): 80 mg via INTRAVENOUS
  Filled 2013-12-17 (×6): qty 1.28

## 2013-12-17 MED ORDER — LEVOFLOXACIN 500 MG PO TABS
500.0000 mg | ORAL_TABLET | Freq: Every day | ORAL | Status: DC
  Administered 2013-12-17 – 2013-12-19 (×3): 500 mg via ORAL
  Filled 2013-12-17 (×5): qty 1

## 2013-12-17 NOTE — Progress Notes (Signed)
Came to visit Mr Carmickle at bedside on behalf of [employer] to [employer] program for [employer] employees/dependents with [employer]. He reports he is familiar with the program but does not feel like he needs further follow up from [facility] perspective. He states he follows closely with his physicians. He pleasantly declined post hospital follow up call as well. Left card with Mr Wilden for him to call if he should have any needs. Appreciative of visit.  Marthenia Rolling, MSN, Ed, RN,BSN- [facility] [phone]

## 2013-12-17 NOTE — Progress Notes (Addendum)
Subjective: Patient is breathing better today but still very wheezy. Cough still very congested and short of breath with moving about. Complains of sinus drainage that is yellow-green. No chest pain. Could not sleep last night secondary to stimulation likely from breathing treatments and steroids  Objective: Weight change:   Intake/Output Summary (Last 24 hours) at 12/17/13 0728 Last data filed at 12/17/13 0600  Gross per 24 hour  Intake    960 ml  Output   1650 ml  Net   -690 ml   Filed Vitals:   12/16/13 2039 12/16/13 2323 12/17/13 0333 12/17/13 0540  BP: 135/80   170/89  Pulse: 93   96  Temp: 97.4 F (36.3 C)   97.4 F (36.3 C)  TempSrc: Oral   Oral  Resp: 22   22  Height:      Weight:    82.101 kg (181 lb)  SpO2: 96% 96% 97% 95%    Physical Exam: General: Alert and oriented and conversive, spirits are good, as usual HEENT: Postnasal drip Neck: Supple without JVD Chest: Diffuse expiratory wheezes, minimal rhonchi, no rales Cardiac: Regular rhythm without murmurs or gallops Abdomen: Soft nontender Extremities: Without edema Neurological: Oriented x3, nonfocal, alert   Lab Results: Results for orders placed during the hospital encounter of 12/16/13 (from the past 48 hour(s))  CBC WITH DIFFERENTIAL     Status: Abnormal   Collection Time    12/16/13 11:00 AM      Result Value Range   WBC 13.3 (*) 4.0 - 10.5 K/uL   RBC 3.74 (*) 4.22 - 5.81 MIL/uL   Hemoglobin 12.5 (*) 13.0 - 17.0 g/dL   HCT 36.9 (*) 39.0 - 52.0 %   MCV 98.7  78.0 - 100.0 fL   MCH 33.4  26.0 - 34.0 pg   MCHC 33.9  30.0 - 36.0 g/dL   RDW 13.7  11.5 - 15.5 %   Platelets 193  150 - 400 K/uL   Neutrophils Relative % 92 (*) 43 - 77 %   Neutro Abs 12.2 (*) 1.7 - 7.7 K/uL   Lymphocytes Relative 4 (*) 12 - 46 %   Lymphs Abs 0.6 (*) 0.7 - 4.0 K/uL   Monocytes Relative 3  3 - 12 %   Monocytes Absolute 0.5  0.1 - 1.0 K/uL   Eosinophils Relative 0  0 - 5 %   Eosinophils Absolute 0.0  0.0 - 0.7 K/uL    Basophils Relative 0  0 - 1 %   Basophils Absolute 0.0  0.0 - 0.1 K/uL  COMPREHENSIVE METABOLIC PANEL     Status: Abnormal   Collection Time    12/16/13 11:00 AM      Result Value Range   Sodium 138  137 - 147 mEq/L   Potassium 4.8  3.7 - 5.3 mEq/L   Chloride 100  96 - 112 mEq/L   CO2 23  19 - 32 mEq/L   Glucose, Bld 103 (*) 70 - 99 mg/dL   BUN 34 (*) 6 - 23 mg/dL   Creatinine, Ser 1.41 (*) 0.50 - 1.35 mg/dL   Calcium 9.8  8.4 - 10.5 mg/dL   Total Protein 7.6  6.0 - 8.3 g/dL   Albumin 3.5  3.5 - 5.2 g/dL   AST 41 (*) 0 - 37 U/L   ALT 77 (*) 0 - 53 U/L   Alkaline Phosphatase 222 (*) 39 - 117 U/L   Total Bilirubin 0.5  0.3 - 1.2 mg/dL  GFR calc non Af Amer 49 (*) >90 mL/min   GFR calc Af Amer 57 (*) >90 mL/min   Comment: (NOTE)     The eGFR has been calculated using the CKD EPI equation.     This calculation has not been validated in all clinical situations.     eGFR's persistently <90 mL/min signify possible Chronic Kidney     Disease.  TROPONIN I     Status: None   Collection Time    12/16/13 11:00 AM      Result Value Range   Troponin I <0.30  <0.30 ng/mL   Comment:            Due to the release kinetics of cTnI,     a negative result within the first hours     of the onset of symptoms does not rule out     myocardial infarction with certainty.     If myocardial infarction is still suspected,     repeat the test at appropriate intervals.  GLUCOSE, CAPILLARY     Status: Abnormal   Collection Time    12/16/13  8:00 PM      Result Value Range   Glucose-Capillary 182 (*) 70 - 99 mg/dL   Comment 1 Notify RN    GLUCOSE, CAPILLARY     Status: Abnormal   Collection Time    12/17/13  5:48 AM      Result Value Range   Glucose-Capillary 128 (*) 70 - 99 mg/dL    Studies/Results: Dg Chest 2 View  12/16/2013   CLINICAL DATA:  Short of breath, lung cancer  EXAM: CHEST  2 VIEW  COMPARISON:  DG CHEST 2 VIEW dated 11/20/2013; CT CHEST W/CM dated 08/12/2013  FINDINGS: Stable  cardiac silhouette. There is volume loss in the right hemi thorax consistent with prior surgery. There is right lower lobe atelectasis and mild effusion. Left lung is clear. Posterior right thoracotomy rib changes noted.  IMPRESSION: 1. Stable postop unchanged in the right hemi thorax with right basilar atelectasis small effusion. 2. Left lung is clear. 3. No change from prior.   Electronically Signed   By: Suzy Bouchard M.D.   On: 12/16/2013 10:51   Medications: Scheduled Meds: . aspirin EC  81 mg Oral Daily  . citalopram  20 mg Oral Daily  . dextromethorphan-guaiFENesin  1 tablet Oral BID  . enoxaparin (LOVENOX) injection  40 mg Subcutaneous Q24H  . ipratropium  0.5 mg Nebulization QID  . irbesartan  150 mg Oral Daily  . levalbuterol  0.63 mg Nebulization Q4H  . levofloxacin  500 mg Oral Daily  . methylPREDNISolone (SOLU-MEDROL) injection  80 mg Intravenous Q12H  . pantoprazole  40 mg Oral Daily  . prenatal multivitamin  1 tablet Oral Q1200  . simvastatin  40 mg Oral Q M,W,F   Continuous Infusions:  PRN Meds:.acetaminophen, acetaminophen, ALPRAZolam, HYDROmorphone (DILAUDID) injection, HYDROmorphone, ondansetron (ZOFRAN) IV, ondansetron, senna-docusate, temazepam, traZODone  Assessment/Plan: Principal Problem:   COPD exacerbation - increase Solu-Medrol to 80 mg every 12 and add Levaquin for purulent postnasal drip. May have a sinusitis as well. Active Problems:   CARCINOMA, LUNG, SQUAMOUS CELL - aware   OSA (obstructive sleep apnea) - never did go through with full sleep testing.   Acute bronchitis - aware  Probable sinusitis - add Levaquin for 7 days  Insomnia - try Restoril  Hypertension - continue on Avapro - ever pose likely not contributing to cough that much    LOS:  1 day   [name], MD 12/17/2013, 7:28 AM

## 2013-12-17 NOTE — Progress Notes (Signed)
Report given to receiving RN. Patient is stable with no verbal complaints nor signs or symptoms of distress or discomfort.

## 2013-12-18 LAB — GLUCOSE, CAPILLARY
GLUCOSE-CAPILLARY: 144 mg/dL — AB (ref 70–99)
GLUCOSE-CAPILLARY: 185 mg/dL — AB (ref 70–99)

## 2013-12-18 LAB — CBC WITH DIFFERENTIAL/PLATELET
Basophils Absolute: 0 10*3/uL (ref 0.0–0.1)
Basophils Relative: 0 % (ref 0–1)
Eosinophils Absolute: 0 10*3/uL (ref 0.0–0.7)
Eosinophils Relative: 0 % (ref 0–5)
HEMATOCRIT: 32.3 % — AB (ref 39.0–52.0)
HEMOGLOBIN: 10.9 g/dL — AB (ref 13.0–17.0)
LYMPHS ABS: 0.6 10*3/uL — AB (ref 0.7–4.0)
LYMPHS PCT: 6 % — AB (ref 12–46)
MCH: 33.1 pg (ref 26.0–34.0)
MCHC: 33.7 g/dL (ref 30.0–36.0)
MCV: 98.2 fL (ref 78.0–100.0)
MONO ABS: 0.3 10*3/uL (ref 0.1–1.0)
MONOS PCT: 3 % (ref 3–12)
NEUTROS ABS: 9.6 10*3/uL — AB (ref 1.7–7.7)
NEUTROS PCT: 91 % — AB (ref 43–77)
Platelets: 223 10*3/uL (ref 150–400)
RBC: 3.29 MIL/uL — AB (ref 4.22–5.81)
RDW: 14 % (ref 11.5–15.5)
WBC: 10.6 10*3/uL — ABNORMAL HIGH (ref 4.0–10.5)

## 2013-12-18 NOTE — Progress Notes (Signed)
Subjective: Patient continues to wheeze but feeling much better. Sinus drainage improved. Slept well last night with Restoril. Anxiety controlled with alprazolam  Objective: Weight change: -6.351 kg (-14 lb)  Intake/Output Summary (Last 24 hours) at 12/18/13 0801 Last data filed at 12/18/13 4097  Gross per 24 hour  Intake   1040 ml  Output   1850 ml  Net   -810 ml   Filed Vitals:   12/17/13 2009 12/17/13 2010 12/18/13 0638 12/18/13 0755  BP:  146/77 150/98   Pulse:  107 105   Temp:  97.7 F (36.5 C) 97.3 F (36.3 C)   TempSrc:  Oral Oral   Resp:   22   Height:      Weight:   82.1 kg (181 lb)   SpO2: 95% 98% 95% 97%    Physical Exam:  General: Alert and oriented and conversive, spirits are good, as usual  HEENT: Postnasal drip  Neck: Supple without JVD  Chest: Diffuse expiratory wheezes, minimal rhonchi, no rales  Cardiac: Regular rhythm without murmurs or gallops  Abdomen: Soft nontender  Extremities: Without edema  Neurological: Oriented x3, nonfocal, alert   Lab Results: Results for orders placed during the hospital encounter of 12/16/13 (from the past 48 hour(s))  CBC WITH DIFFERENTIAL     Status: Abnormal   Collection Time    12/16/13 11:00 AM      Result Value Range   WBC 13.3 (*) 4.0 - 10.5 K/uL   RBC 3.74 (*) 4.22 - 5.81 MIL/uL   Hemoglobin 12.5 (*) 13.0 - 17.0 g/dL   HCT 36.9 (*) 39.0 - 52.0 %   MCV 98.7  78.0 - 100.0 fL   MCH 33.4  26.0 - 34.0 pg   MCHC 33.9  30.0 - 36.0 g/dL   RDW 13.7  11.5 - 15.5 %   Platelets 193  150 - 400 K/uL   Neutrophils Relative % 92 (*) 43 - 77 %   Neutro Abs 12.2 (*) 1.7 - 7.7 K/uL   Lymphocytes Relative 4 (*) 12 - 46 %   Lymphs Abs 0.6 (*) 0.7 - 4.0 K/uL   Monocytes Relative 3  3 - 12 %   Monocytes Absolute 0.5  0.1 - 1.0 K/uL   Eosinophils Relative 0  0 - 5 %   Eosinophils Absolute 0.0  0.0 - 0.7 K/uL   Basophils Relative 0  0 - 1 %   Basophils Absolute 0.0  0.0 - 0.1 K/uL  COMPREHENSIVE METABOLIC PANEL     Status:  Abnormal   Collection Time    12/16/13 11:00 AM      Result Value Range   Sodium 138  137 - 147 mEq/L   Potassium 4.8  3.7 - 5.3 mEq/L   Chloride 100  96 - 112 mEq/L   CO2 23  19 - 32 mEq/L   Glucose, Bld 103 (*) 70 - 99 mg/dL   BUN 34 (*) 6 - 23 mg/dL   Creatinine, Ser 1.41 (*) 0.50 - 1.35 mg/dL   Calcium 9.8  8.4 - 10.5 mg/dL   Total Protein 7.6  6.0 - 8.3 g/dL   Albumin 3.5  3.5 - 5.2 g/dL   AST 41 (*) 0 - 37 U/L   ALT 77 (*) 0 - 53 U/L   Alkaline Phosphatase 222 (*) 39 - 117 U/L   Total Bilirubin 0.5  0.3 - 1.2 mg/dL   GFR calc non Af Amer 49 (*) >90 mL/min   GFR calc  Af Amer 57 (*) >90 mL/min   Comment: (NOTE)     The eGFR has been calculated using the CKD EPI equation.     This calculation has not been validated in all clinical situations.     eGFR's persistently <90 mL/min signify possible Chronic Kidney     Disease.  TROPONIN I     Status: None   Collection Time    12/16/13 11:00 AM      Result Value Range   Troponin I <0.30  <0.30 ng/mL   Comment:            Due to the release kinetics of cTnI,     a negative result within the first hours     of the onset of symptoms does not rule out     myocardial infarction with certainty.     If myocardial infarction is still suspected,     repeat the test at appropriate intervals.  GLUCOSE, CAPILLARY     Status: Abnormal   Collection Time    12/16/13  8:00 PM      Result Value Range   Glucose-Capillary 182 (*) 70 - 99 mg/dL   Comment 1 Notify RN    GLUCOSE, CAPILLARY     Status: Abnormal   Collection Time    12/17/13  5:48 AM      Result Value Range   Glucose-Capillary 128 (*) 70 - 99 mg/dL  GLUCOSE, CAPILLARY     Status: Abnormal   Collection Time    12/17/13  8:24 PM      Result Value Range   Glucose-Capillary 203 (*) 70 - 99 mg/dL  CBC WITH DIFFERENTIAL     Status: Abnormal   Collection Time    12/18/13  3:15 AM      Result Value Range   WBC 10.6 (*) 4.0 - 10.5 K/uL   RBC 3.29 (*) 4.22 - 5.81 MIL/uL    Hemoglobin 10.9 (*) 13.0 - 17.0 g/dL   HCT 32.3 (*) 39.0 - 52.0 %   MCV 98.2  78.0 - 100.0 fL   MCH 33.1  26.0 - 34.0 pg   MCHC 33.7  30.0 - 36.0 g/dL   RDW 14.0  11.5 - 15.5 %   Platelets 223  150 - 400 K/uL   Neutrophils Relative % 91 (*) 43 - 77 %   Neutro Abs 9.6 (*) 1.7 - 7.7 K/uL   Lymphocytes Relative 6 (*) 12 - 46 %   Lymphs Abs 0.6 (*) 0.7 - 4.0 K/uL   Monocytes Relative 3  3 - 12 %   Monocytes Absolute 0.3  0.1 - 1.0 K/uL   Eosinophils Relative 0  0 - 5 %   Eosinophils Absolute 0.0  0.0 - 0.7 K/uL   Basophils Relative 0  0 - 1 %   Basophils Absolute 0.0  0.0 - 0.1 K/uL    Studies/Results: Dg Chest 2 View  12/16/2013   CLINICAL DATA:  Short of breath, lung cancer  EXAM: CHEST  2 VIEW  COMPARISON:  DG CHEST 2 VIEW dated 11/20/2013; CT CHEST W/CM dated 08/12/2013  FINDINGS: Stable cardiac silhouette. There is volume loss in the right hemi thorax consistent with prior surgery. There is right lower lobe atelectasis and mild effusion. Left lung is clear. Posterior right thoracotomy rib changes noted.  IMPRESSION: 1. Stable postop unchanged in the right hemi thorax with right basilar atelectasis small effusion. 2. Left lung is clear. 3. No change from prior.  Electronically Signed   By: Suzy Bouchard M.D.   On: 12/16/2013 10:51   Medications: Scheduled Meds: . aspirin EC  81 mg Oral Daily  . citalopram  20 mg Oral Daily  . dextromethorphan-guaiFENesin  1 tablet Oral BID  . enoxaparin (LOVENOX) injection  40 mg Subcutaneous Q24H  . ipratropium  0.5 mg Nebulization QID  . irbesartan  150 mg Oral Daily  . levalbuterol  0.63 mg Nebulization Q4H  . levofloxacin  500 mg Oral Daily  . methylPREDNISolone (SOLU-MEDROL) injection  80 mg Intravenous Q12H  . pantoprazole  40 mg Oral Daily  . prenatal multivitamin  1 tablet Oral Q1200  . simvastatin  40 mg Oral Q M,W,F   Continuous Infusions:  PRN Meds:.acetaminophen, acetaminophen, ALPRAZolam, HYDROmorphone (DILAUDID) injection,  HYDROmorphone, ondansetron (ZOFRAN) IV, ondansetron, senna-docusate, temazepam, traZODone  Assessment/Plan: Principal Problem:   COPD exacerbation Active Problems:   CARCINOMA, LUNG, SQUAMOUS CELL   OSA (obstructive sleep apnea)   Acute bronchitis  Sinusitis Principal Problem:  COPD exacerbation - continue Solu-Medrol to 80 mg every 12 and continue Levaquin for purulent postnasal drip consistent with sinusitis Active Problems:  CARCINOMA, LUNG, SQUAMOUS CELL - aware  OSA (obstructive sleep apnea) - never did go through with full sleep testing.  Acute bronchitis - aware, improving but still wheezing Sinusitis - symptomatically better on Levaquin and  Probable sinusitis - add Levaquin for 7 days  Insomnia - try Restoril  Hypertension - continue on Avapro - ever pose likely not contributing to cough that much Anxiety - controlled well with alprazolam Activity - ambulate Disposition - try to transition to oral steroids tomorrow and hopefully discharge home on Friday, January 16      LOS: 2 days   [name], MD 12/18/2013, 8:01 AM

## 2013-12-19 LAB — BASIC METABOLIC PANEL
BUN: 46 mg/dL — AB (ref 6–23)
CHLORIDE: 101 meq/L (ref 96–112)
CO2: 22 meq/L (ref 19–32)
CREATININE: 1.29 mg/dL (ref 0.50–1.35)
Calcium: 9.5 mg/dL (ref 8.4–10.5)
GFR calc non Af Amer: 55 mL/min — ABNORMAL LOW (ref >90)
GFR, EST AFRICAN AMERICAN: 63 mL/min — AB (ref >90)
Glucose, Bld: 122 mg/dL — ABNORMAL HIGH (ref 70–99)
POTASSIUM: 5.2 meq/L (ref 3.7–5.3)
Sodium: 138 mEq/L (ref 137–147)

## 2013-12-19 LAB — CBC WITH DIFFERENTIAL/PLATELET
BASOS ABS: 0 10*3/uL (ref 0.0–0.1)
BASOS PCT: 0 % (ref 0–1)
Eosinophils Absolute: 0 10*3/uL (ref 0.0–0.7)
Eosinophils Relative: 0 % (ref 0–5)
HEMATOCRIT: 33.3 % — AB (ref 39.0–52.0)
HEMOGLOBIN: 11.2 g/dL — AB (ref 13.0–17.0)
LYMPHS PCT: 6 % — AB (ref 12–46)
Lymphs Abs: 0.7 10*3/uL (ref 0.7–4.0)
MCH: 33.2 pg (ref 26.0–34.0)
MCHC: 33.6 g/dL (ref 30.0–36.0)
MCV: 98.8 fL (ref 78.0–100.0)
MONO ABS: 0.6 10*3/uL (ref 0.1–1.0)
MONOS PCT: 5 % (ref 3–12)
NEUTROS PCT: 89 % — AB (ref 43–77)
Neutro Abs: 10.7 10*3/uL — ABNORMAL HIGH (ref 1.7–7.7)
Platelets: 237 10*3/uL (ref 150–400)
RBC: 3.37 MIL/uL — ABNORMAL LOW (ref 4.22–5.81)
RDW: 14.1 % (ref 11.5–15.5)
WBC: 12 10*3/uL — AB (ref 4.0–10.5)

## 2013-12-19 LAB — GLUCOSE, CAPILLARY
GLUCOSE-CAPILLARY: 122 mg/dL — AB (ref 70–99)
GLUCOSE-CAPILLARY: 132 mg/dL — AB (ref 70–99)

## 2013-12-19 MED ORDER — PREDNISONE 50 MG PO TABS
50.0000 mg | ORAL_TABLET | Freq: Two times a day (BID) | ORAL | Status: DC
  Administered 2013-12-19 – 2013-12-20 (×2): 50 mg via ORAL
  Filled 2013-12-19 (×4): qty 1

## 2013-12-19 MED ORDER — IPRATROPIUM BROMIDE 0.02 % IN SOLN
0.5000 mg | Freq: Three times a day (TID) | RESPIRATORY_TRACT | Status: DC
  Administered 2013-12-19 (×2): 0.5 mg via RESPIRATORY_TRACT
  Filled 2013-12-19 (×3): qty 2.5

## 2013-12-19 MED ORDER — LEVALBUTEROL HCL 0.63 MG/3ML IN NEBU: 0.6300 mg | INHALATION_SOLUTION | Freq: Four times a day (QID) | RESPIRATORY_TRACT | Status: DC | PRN

## 2013-12-19 MED ORDER — LEVALBUTEROL HCL 0.63 MG/3ML IN NEBU
0.6300 mg | INHALATION_SOLUTION | Freq: Three times a day (TID) | RESPIRATORY_TRACT | Status: DC
  Administered 2013-12-19 (×2): 0.63 mg via RESPIRATORY_TRACT
  Filled 2013-12-19 (×6): qty 3

## 2013-12-19 NOTE — Progress Notes (Signed)
Subjective: Cough still congested but wheezing continues to improve. We'll transition to oral steroids and if clear in a.m., likely discharge home. Sinus drainage is improving. Anxiety and insomnia well treated  Objective: Weight change: -0.2 kg (-7.1 oz)  Intake/Output Summary (Last 24 hours) at 12/19/13 0722 Last data filed at 12/19/13 0511  Gross per 24 hour  Intake    720 ml  Output   1650 ml  Net   -930 ml   Filed Vitals:   12/18/13 0948 12/18/13 1958 12/18/13 2219 12/19/13 0654  BP: 167/77  104/63 160/90  Pulse: 108 109 102 100  Temp:   98 F (36.7 C) 97.3 F (36.3 C)  TempSrc:   Oral Oral  Resp: 20 20 20 21   Height:      Weight:    81.9 kg (180 lb 8.9 oz)  SpO2: 98% 96% 97% 96%   Physical Exam: Vital signs as above General: Alert and oriented and conversive, spirits are good, as usual  HEENT: Postnasal drip  Neck: Supple without JVD  Chest: Diffuse mild expiratory wheezes, minimal rhonchi, no rales  Cardiac: Regular rhythm without murmurs or gallops  Abdomen: Soft nontender  Extremities: Without edema  Neurological: Oriented x3, nonfocal, alert  Lab Results: Results for orders placed during the hospital encounter of 12/16/13 (from the past 48 hour(s))  GLUCOSE, CAPILLARY     Status: Abnormal   Collection Time    12/17/13  8:24 PM      Result Value Range   Glucose-Capillary 203 (*) 70 - 99 mg/dL  CBC WITH DIFFERENTIAL     Status: Abnormal   Collection Time    12/18/13  3:15 AM      Result Value Range   WBC 10.6 (*) 4.0 - 10.5 K/uL   RBC 3.29 (*) 4.22 - 5.81 MIL/uL   Hemoglobin 10.9 (*) 13.0 - 17.0 g/dL   HCT 32.3 (*) 39.0 - 52.0 %   MCV 98.2  78.0 - 100.0 fL   MCH 33.1  26.0 - 34.0 pg   MCHC 33.7  30.0 - 36.0 g/dL   RDW 14.0  11.5 - 15.5 %   Platelets 223  150 - 400 K/uL   Neutrophils Relative % 91 (*) 43 - 77 %   Neutro Abs 9.6 (*) 1.7 - 7.7 K/uL   Lymphocytes Relative 6 (*) 12 - 46 %   Lymphs Abs 0.6 (*) 0.7 - 4.0 K/uL   Monocytes Relative 3  3 - 12  %   Monocytes Absolute 0.3  0.1 - 1.0 K/uL   Eosinophils Relative 0  0 - 5 %   Eosinophils Absolute 0.0  0.0 - 0.7 K/uL   Basophils Relative 0  0 - 1 %   Basophils Absolute 0.0  0.0 - 0.1 K/uL  GLUCOSE, CAPILLARY     Status: Abnormal   Collection Time    12/18/13  8:05 AM      Result Value Range   Glucose-Capillary 144 (*) 70 - 99 mg/dL   Comment 1 Documented in Chart    GLUCOSE, CAPILLARY     Status: Abnormal   Collection Time    12/18/13  8:14 PM      Result Value Range   Glucose-Capillary 185 (*) 70 - 99 mg/dL   Comment 1 Notify RN    CBC WITH DIFFERENTIAL     Status: Abnormal   Collection Time    12/19/13  5:27 AM      Result Value Range  WBC 12.0 (*) 4.0 - 10.5 K/uL   RBC 3.37 (*) 4.22 - 5.81 MIL/uL   Hemoglobin 11.2 (*) 13.0 - 17.0 g/dL   HCT 33.3 (*) 39.0 - 52.0 %   MCV 98.8  78.0 - 100.0 fL   MCH 33.2  26.0 - 34.0 pg   MCHC 33.6  30.0 - 36.0 g/dL   RDW 14.1  11.5 - 15.5 %   Platelets 237  150 - 400 K/uL   Neutrophils Relative % 89 (*) 43 - 77 %   Neutro Abs 10.7 (*) 1.7 - 7.7 K/uL   Lymphocytes Relative 6 (*) 12 - 46 %   Lymphs Abs 0.7  0.7 - 4.0 K/uL   Monocytes Relative 5  3 - 12 %   Monocytes Absolute 0.6  0.1 - 1.0 K/uL   Eosinophils Relative 0  0 - 5 %   Eosinophils Absolute 0.0  0.0 - 0.7 K/uL   Basophils Relative 0  0 - 1 %   Basophils Absolute 0.0  0.0 - 0.1 K/uL  BASIC METABOLIC PANEL     Status: Abnormal   Collection Time    12/19/13  5:27 AM      Result Value Range   Sodium 138  137 - 147 mEq/L   Potassium 5.2  3.7 - 5.3 mEq/L   Chloride 101  96 - 112 mEq/L   CO2 22  19 - 32 mEq/L   Glucose, Bld 122 (*) 70 - 99 mg/dL   BUN 46 (*) 6 - 23 mg/dL   Creatinine, Ser 1.29  0.50 - 1.35 mg/dL   Calcium 9.5  8.4 - 10.5 mg/dL   GFR calc non Af Amer 55 (*) >90 mL/min   GFR calc Af Amer 63 (*) >90 mL/min   Comment: (NOTE)     The eGFR has been calculated using the CKD EPI equation.     This calculation has not been validated in all clinical situations.      eGFR's persistently <90 mL/min signify possible Chronic Kidney     Disease.    Studies/Results: No results found. Medications: Scheduled Meds: . aspirin EC  81 mg Oral Daily  . citalopram  20 mg Oral Daily  . dextromethorphan-guaiFENesin  1 tablet Oral BID  . enoxaparin (LOVENOX) injection  40 mg Subcutaneous Q24H  . ipratropium  0.5 mg Nebulization QID  . irbesartan  150 mg Oral Daily  . levalbuterol  0.63 mg Nebulization Q4H  . levofloxacin  500 mg Oral Daily  . pantoprazole  40 mg Oral Daily  . predniSONE  50 mg Oral BID WC  . prenatal multivitamin  1 tablet Oral Q1200  . simvastatin  40 mg Oral Q M,W,F   Continuous Infusions:  PRN Meds:.acetaminophen, acetaminophen, ALPRAZolam, HYDROmorphone (DILAUDID) injection, HYDROmorphone, ondansetron (ZOFRAN) IV, ondansetron, senna-docusate, temazepam, traZODone  Assessment/Plan:  Principal Problem:  COPD exacerbation - discontinue Solu-Medrol to 80 mg every 12 and changed to prednisone at 7 AM and 3 PM, 50 mg each dose continue Levaquin for purulent postnasal drip consistent with sinusitis  Active Problems:  CARCINOMA, LUNG, SQUAMOUS CELL - aware  OSA (obstructive sleep apnea) - never did go through with full sleep testing.  Acute bronchitis - aware, improving, but still wheezing  Sinusitis - symptomatically better on Levaquin and steroids  Probable sinusitis - Levaquin 500 milligrams daily for a total of 7 days, day 3 of therapy  Insomnia - continue Restoril as needed  Hypertension - continue on Avapro -  likely  not contributing to cough that much  Anxiety - controlled well with alprazolam  Activity - continue to ambulate  Disposition -  transition to oral steroids today and hopefully discharge home on Friday, January 16   LOS: 3 days   [name], MD 12/19/2013, 7:22 AM

## 2013-12-19 NOTE — Progress Notes (Signed)
The patient did not have any complaints of pain or acute changes overnight.

## 2013-12-20 LAB — GLUCOSE, CAPILLARY: GLUCOSE-CAPILLARY: 93 mg/dL (ref 70–99)

## 2013-12-20 MED ORDER — TEMAZEPAM 30 MG PO CAPS: 30.0000 mg | ORAL_CAPSULE | Freq: Every evening | ORAL | Status: DC | PRN

## 2013-12-20 MED ORDER — LEVOFLOXACIN 500 MG PO TABS: 500.0000 mg | ORAL_TABLET | Freq: Every day | ORAL | Status: AC

## 2013-12-20 MED ORDER — PREDNISONE 50 MG PO TABS: ORAL_TABLET | ORAL | Status: DC

## 2013-12-20 MED ORDER — DM-GUAIFENESIN ER 30-600 MG PO TB12: 1.0000 | ORAL_TABLET | Freq: Two times a day (BID) | ORAL | Status: DC

## 2013-12-20 NOTE — Progress Notes (Signed)
Patient ambulatory at discharge, refusing wheelchair. Patient states he would like to leave on his own due to "stopping by mail room" on his way out. Patient states he will probably be visiting for awhile. Patient independent with ambulation and gait steady.

## 2013-12-20 NOTE — Discharge Summary (Signed)
Physician Discharge Summary  NAME:Todd Villanueva  LPF:[id number]  DOB: [birth year]   Admit date: 12/16/2013 Discharge date: 12/20/2013  Discharge Diagnoses:  Principal Problem:   COPD exacerbation Active Problems:   CARCINOMA, LUNG, SQUAMOUS CELL   OSA (obstructive sleep apnea)   Acute bronchitis   Principal Problem:  COPD exacerbation - discharge home on prednisone 50 mg daily as well as 3 more days of Levaquin and home nebulizer therapy as per discharge medications. Patient much improved  Active Problems:  CARCINOMA, LUNG, SQUAMOUS CELL - aware  OSA (obstructive sleep apnea) - never did go through with full sleep testing.  Acute bronchitis - symptomatically resolving  Sinusitis - symptomatically resolved on Levaquin and steroids  Probable sinusitis - Levaquin 500 milligrams daily for a total of 7 days, day 4 of therapy - 3 more days as outpatient Insomnia - continue Restoril as needed  Hypertension - continue on Benicar as an outpatient - likely not contributing to cough that much  Anxiety - controlled well with alprazolam  Activity - discharge home with light activity around the house  Disposition - discharge home on oral steroids as mentioned. Followup in office next week  Discharge Physical Exam: Physical Exam: Vital signs as above  General: Alert and oriented and conversive, spirits are good, as usual  HEENT: Postnasal drip resolved Neck: Supple without JVD  Chest: Wheezes resolved, minimal rhonchi, no rales  Cardiac: Regular rhythm without murmurs or gallops  Abdomen: Soft nontender  Extremities: Without edema  Neurological: Oriented x3, nonfocal, alert  General Appearance: Alert, cooperative, no distress, appears stated age  Weight change: -0.389 kg (-13.7 oz)  Intake/Output Summary (Last 24 hours) at 12/20/13 5329 Last data filed at 12/20/13 0426  Gross per 24 hour  Intake   1300 ml  Output   1950 ml  Net   -650 ml   Filed Vitals:   12/19/13 1927 12/19/13  2049 12/20/13 0419 12/20/13 0525  BP:  164/81  151/75  Pulse: 89 87  92  Temp:  97.6 F (36.4 C)  97.4 F (36.3 C)  TempSrc:  Oral  Oral  Resp: 18 18  18   Height:      Weight:   81.511 kg (179 lb 11.2 oz)   SpO2: 96% 94%  98%    Lungs: Clear to auscultation bilaterally, respirations unlabored Heart: Regular rate and rhythm, S1 and S2 normal, no murmur, rub or gallop Abdomen: Soft, non-tender, bowel sounds active all four quadrants, no masses, no organomegaly Extremities: Extremities normal, atraumatic, no cyanosis or edema Neuro: Oriented x3 nonfocal  Discharge Condition: Much improved  Hospital Course: Mr. 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 He has a history of lung cancer treated in COPD and stopped smoking in the last couple of years. The last month he's had bronchitis with bronchospasm and finally had to be admitted a few days ago because of severe bronchospasm and shortness of breath. Chest x-ray was found to be stable and he has responded remarkably well to antibiotics, and IV Solu-Medrol and respiratory treatments. Chronic anxiety was well controlled with a past lambert and insomnia was controlled with temazepam. He is now ready for discharge  Things to follow up in the outpatient setting: Anxiety/shortness of breath/insomnia  Consults: Noncontributory  Disposition: 01-Home or Self Care  Discharge Orders   Future Appointments Provider Department Dept Phone   01/07/2014 2:15 PM [name], MD [facility] [phone]  02/05/2014 8:15 AM [facility] [phone]   02/07/2014 8:00 AM [facility] [address] [facility] [phone]   Liquids only 4 hours prior to your exam. Any medications can be taken as usual. Please arrive 15 min prior to your scheduled exam time.   02/11/2014 10:30 AM [name], MD [facility] [phone]   04/25/2014 2:30 PM Candee Furbish, MD [facility] [phone]   Future Orders Complete By Expires   Call MD for:  difficulty breathing, headache or visual disturbances  As directed    Call MD for:  difficulty breathing, headache or visual disturbances  As directed    Call MD for:  extreme fatigue  As directed    Call MD for:  extreme fatigue  As directed    Call MD for:  severe uncontrolled pain  As directed    Call MD for:  temperature >100.4  As directed    Call MD for:  temperature >100.4  As directed    Diet - low sodium heart healthy  As directed    Diet - low sodium heart healthy  As directed    Discharge instructions  As directed    Comments:     Use nebulizer therapy every 4 hours while awake and every 2 hours if wheezing severely. Take prednisone 50 mg daily and I will plan to see you in the office next week   Increase activity slowly  As directed    Increase activity slowly  As directed        Medication List    STOP taking these medications       lisinopril 40 MG tablet  Commonly known as:  PRINIVIL,ZESTRIL      TAKE these medications       albuterol 108 (90 BASE) MCG/ACT inhaler  Commonly known as:  PROVENTIL HFA;VENTOLIN HFA  Inhale 2 puffs into the lungs every 4 (four) hours as needed for wheezing or shortness of breath.     ALPRAZolam 0.25 MG tablet  Commonly known as:  XANAX  Take 0.25 mg by mouth as needed for anxiety (prior to opthalmic injections.).     aspirin 81 MG tablet  Take 81 mg by mouth daily.     bevacizumab 1.25 mg/0.1 mL Soln  Commonly known as:  AVASTIN  1.25 mg by Intravitreal route every 30 (thirty) days.     citalopram 20 MG tablet  Commonly known as:  CELEXA  Take 20 mg by mouth daily.     dextromethorphan-guaiFENesin 30-600 MG per 12 hr tablet  Commonly known as:  MUCINEX DM  Take 1 tablet by mouth 2 (two) times daily.     FOLIVANE-PLUS Caps  Take 1 capsule by mouth  daily.     levofloxacin 500 MG tablet  Commonly known as:  LEVAQUIN  Take 1 tablet (500 mg total) by mouth daily for 3 more days      olmesartan 20 MG tablet  Commonly known as:  BENICAR  Take 20 mg by mouth daily.     omeprazole 20 MG capsule  Commonly known as:  PRILOSEC  Take 20 mg by mouth daily.     predniSONE 50 MG tablet  Commonly known as:  DELTASONE  1 orally daily or  5  of the 10 mg tablets daily     simvastatin 40 MG tablet  Commonly known as:  ZOCOR  Take 40 mg by mouth  every Monday, Wednesday, and Friday.     temazepam 30 MG capsule  Commonly known as:  RESTORIL  Take 1 capsule (30 mg total) by mouth at bedtime as needed for sleep.     XOPENEX 0.63 MG/3ML nebulizer solution  Generic drug:  levalbuterol  Take 0.63 mg by nebulization 3 (three) times daily as needed for wheezing or shortness of breath.         The results of significant diagnostics from this hospitalization (including imaging, microbiology, ancillary and laboratory) are listed below for reference.    Significant Diagnostic Studies: Dg Chest 2 View  12/16/2013   CLINICAL DATA:  Short of breath, lung cancer  EXAM: CHEST  2 VIEW  COMPARISON:  DG CHEST 2 VIEW dated 11/20/2013; CT CHEST W/CM dated 08/12/2013  FINDINGS: Stable cardiac silhouette. There is volume loss in the right hemi thorax consistent with prior surgery. There is right lower lobe atelectasis and mild effusion. Left lung is clear. Posterior right thoracotomy rib changes noted.  IMPRESSION: 1. Stable postop unchanged in the right hemi thorax with right basilar atelectasis small effusion. 2. Left lung is clear. 3. No change from prior.   Electronically Signed   By: Suzy Bouchard M.D.   On: 12/16/2013 10:51   Dg Chest 2 View  11/20/2013   CLINICAL DATA:  Shortness of breath and congestion.  EXAM: CHEST  2 VIEW  COMPARISON:  Chest CT 08/12/2013 and chest x-ray 09/23/2011.  FINDINGS: Stable postoperative changes involving the right hemi  thorax with loss of volume, chronic pleural thickening loculated pleural fluid and right infrahilar and basilar scarring. The left lung is clear. No pleural effusion. No pneumothorax. The bony thorax is intact.  IMPRESSION: Chronic postoperative changes involving the right hemi thorax with a chronic loculated right pleural effusion, right basilar scarring and rib changes.  No definite acute overlying pulmonary process.   Electronically Signed   By: Kalman Jewels M.D.   On: 11/20/2013 15:49    Microbiology: No results found for this or any previous visit (from the past 240 hour(s)).   Labs: Results for orders placed during the hospital encounter of 12/16/13  CBC WITH DIFFERENTIAL      Result Value Range   WBC 13.3 (*) 4.0 - 10.5 K/uL   RBC 3.74 (*) 4.22 - 5.81 MIL/uL   Hemoglobin 12.5 (*) 13.0 - 17.0 g/dL   HCT 36.9 (*) 39.0 - 52.0 %   MCV 98.7  78.0 - 100.0 fL   MCH 33.4  26.0 - 34.0 pg   MCHC 33.9  30.0 - 36.0 g/dL   RDW 13.7  11.5 - 15.5 %   Platelets 193  150 - 400 K/uL   Neutrophils Relative % 92 (*) 43 - 77 %   Neutro Abs 12.2 (*) 1.7 - 7.7 K/uL   Lymphocytes Relative 4 (*) 12 - 46 %   Lymphs Abs 0.6 (*) 0.7 - 4.0 K/uL   Monocytes Relative 3  3 - 12 %   Monocytes Absolute 0.5  0.1 - 1.0 K/uL   Eosinophils Relative 0  0 - 5 %   Eosinophils Absolute 0.0  0.0 - 0.7 K/uL   Basophils Relative 0  0 - 1 %   Basophils Absolute 0.0  0.0 - 0.1 K/uL  COMPREHENSIVE METABOLIC PANEL      Result Value Range   Sodium 138  137 - 147 mEq/L   Potassium 4.8  3.7 - 5.3 mEq/L   Chloride 100  96 - 112  mEq/L   CO2 23  19 - 32 mEq/L   Glucose, Bld 103 (*) 70 - 99 mg/dL   BUN 34 (*) 6 - 23 mg/dL   Creatinine, Ser 1.41 (*) 0.50 - 1.35 mg/dL   Calcium 9.8  8.4 - 10.5 mg/dL   Total Protein 7.6  6.0 - 8.3 g/dL   Albumin 3.5  3.5 - 5.2 g/dL   AST 41 (*) 0 - 37 U/L   ALT 77 (*) 0 - 53 U/L   Alkaline Phosphatase 222 (*) 39 - 117 U/L   Total Bilirubin 0.5  0.3 - 1.2 mg/dL   GFR calc non Af Amer 49  (*) >90 mL/min   GFR calc Af Amer 57 (*) >90 mL/min  TROPONIN I      Result Value Range   Troponin I <0.30  <0.30 ng/mL  GLUCOSE, CAPILLARY      Result Value Range   Glucose-Capillary 182 (*) 70 - 99 mg/dL   Comment 1 Notify RN    GLUCOSE, CAPILLARY      Result Value Range   Glucose-Capillary 128 (*) 70 - 99 mg/dL  CBC WITH DIFFERENTIAL      Result Value Range   WBC 10.6 (*) 4.0 - 10.5 K/uL   RBC 3.29 (*) 4.22 - 5.81 MIL/uL   Hemoglobin 10.9 (*) 13.0 - 17.0 g/dL   HCT 32.3 (*) 39.0 - 52.0 %   MCV 98.2  78.0 - 100.0 fL   MCH 33.1  26.0 - 34.0 pg   MCHC 33.7  30.0 - 36.0 g/dL   RDW 14.0  11.5 - 15.5 %   Platelets 223  150 - 400 K/uL   Neutrophils Relative % 91 (*) 43 - 77 %   Neutro Abs 9.6 (*) 1.7 - 7.7 K/uL   Lymphocytes Relative 6 (*) 12 - 46 %   Lymphs Abs 0.6 (*) 0.7 - 4.0 K/uL   Monocytes Relative 3  3 - 12 %   Monocytes Absolute 0.3  0.1 - 1.0 K/uL   Eosinophils Relative 0  0 - 5 %   Eosinophils Absolute 0.0  0.0 - 0.7 K/uL   Basophils Relative 0  0 - 1 %   Basophils Absolute 0.0  0.0 - 0.1 K/uL  GLUCOSE, CAPILLARY      Result Value Range   Glucose-Capillary 203 (*) 70 - 99 mg/dL  GLUCOSE, CAPILLARY      Result Value Range   Glucose-Capillary 144 (*) 70 - 99 mg/dL   Comment 1 Documented in Chart    CBC WITH DIFFERENTIAL      Result Value Range   WBC 12.0 (*) 4.0 - 10.5 K/uL   RBC 3.37 (*) 4.22 - 5.81 MIL/uL   Hemoglobin 11.2 (*) 13.0 - 17.0 g/dL   HCT 33.3 (*) 39.0 - 52.0 %   MCV 98.8  78.0 - 100.0 fL   MCH 33.2  26.0 - 34.0 pg   MCHC 33.6  30.0 - 36.0 g/dL   RDW 14.1  11.5 - 15.5 %   Platelets 237  150 - 400 K/uL   Neutrophils Relative % 89 (*) 43 - 77 %   Neutro Abs 10.7 (*) 1.7 - 7.7 K/uL   Lymphocytes Relative 6 (*) 12 - 46 %   Lymphs Abs 0.7  0.7 - 4.0 K/uL   Monocytes Relative 5  3 - 12 %   Monocytes Absolute 0.6  0.1 - 1.0 K/uL   Eosinophils Relative 0  0 - 5 %  Eosinophils Absolute 0.0  0.0 - 0.7 K/uL   Basophils Relative 0  0 - 1 %    Basophils Absolute 0.0  0.0 - 0.1 K/uL  BASIC METABOLIC PANEL      Result Value Range   Sodium 138  137 - 147 mEq/L   Potassium 5.2  3.7 - 5.3 mEq/L   Chloride 101  96 - 112 mEq/L   CO2 22  19 - 32 mEq/L   Glucose, Bld 122 (*) 70 - 99 mg/dL   BUN 46 (*) 6 - 23 mg/dL   Creatinine, Ser 1.29  0.50 - 1.35 mg/dL   Calcium 9.5  8.4 - 10.5 mg/dL   GFR calc non Af Amer 55 (*) >90 mL/min   GFR calc Af Amer 63 (*) >90 mL/min  GLUCOSE, CAPILLARY      Result Value Range   Glucose-Capillary 185 (*) 70 - 99 mg/dL   Comment 1 Notify RN    GLUCOSE, CAPILLARY      Result Value Range   Glucose-Capillary 122 (*) 70 - 99 mg/dL  GLUCOSE, CAPILLARY      Result Value Range   Glucose-Capillary 132 (*) 70 - 99 mg/dL   Comment 1 Notify RN     Comment 2 Documented in Chart    GLUCOSE, CAPILLARY      Result Value Range   Glucose-Capillary 93  70 - 99 mg/dL    Time coordinating discharge: 32 minutes  Signed: [name], MD 12/20/2013, 8:12 AM

## 2013-12-20 NOTE — Progress Notes (Signed)
Discharge instructions completed with patient. Patient instructed on f/u appt, diet, and when to take medications. Prescriptions for Mucinex, Restoril, Prednisone and Levaquin given. Information regarding low sodium diet given. Patient verbally understands instructions.

## 2014-01-07 ENCOUNTER — Institutional Professional Consult (permissible substitution): Payer: 59 | Admitting: Pulmonary Disease

## 2014-01-07 ENCOUNTER — Encounter: Payer: Self-pay | Admitting: Internal Medicine

## 2014-01-07 ENCOUNTER — Ambulatory Visit (INDEPENDENT_AMBULATORY_CARE_PROVIDER_SITE_OTHER): Payer: 59 | Admitting: Internal Medicine

## 2014-01-07 VITALS — BP 114/70 | HR 122 | Temp 97.6°F | Ht 66.5 in | Wt 180.6 lb

## 2014-01-07 DIAGNOSIS — J438 Other emphysema: Secondary | ICD-10-CM

## 2014-01-07 DIAGNOSIS — R0600 Dyspnea, unspecified: Secondary | ICD-10-CM

## 2014-01-07 DIAGNOSIS — R0989 Other specified symptoms and signs involving the circulatory and respiratory systems: Secondary | ICD-10-CM

## 2014-01-07 DIAGNOSIS — J439 Emphysema, unspecified: Secondary | ICD-10-CM

## 2014-01-07 DIAGNOSIS — R0609 Other forms of dyspnea: Secondary | ICD-10-CM

## 2014-01-07 MED ORDER — METHYLPREDNISOLONE ACETATE 80 MG/ML IJ SUSP
120.0000 mg | Freq: Once | INTRAMUSCULAR | Status: AC
  Administered 2014-01-07: 120 mg via INTRAMUSCULAR

## 2014-01-07 MED ORDER — MEPERIDINE HCL 50 MG PO TABS: ORAL_TABLET | ORAL | Status: DC

## 2014-01-07 MED ORDER — FUROSEMIDE 20 MG PO TABS
ORAL_TABLET | ORAL | Status: DC
Start: 2014-01-07 — End: 2014-02-05

## 2014-01-07 NOTE — Progress Notes (Signed)
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 completed 2013 and stopped all medication and did fine breathing  Rare need for neb With but generalize pain with coughing assoc with multiple rib fx  with walking but no step and still work but Nov 2014 acutely worse with severe cough  referred by [facility] for eval of  Cough/sob p admit while on ACEi (d/c 12/10/13) with prev pfts 04/2012 showing no significant copd  Admit date: 12/16/2013  Discharge date: 12/20/2013  Discharge Diagnoses:   COPD exacerbation   CARCINOMA, LUNG, SQUAMOUS CELL  OSA (obstructive sleep apnea)  Acute bronchitis   COPD exacerbation - discharge home on prednisone 50 mg daily as well as 3 more days of Levaquin and home nebulizer therapy as per discharge medications. Patient much improved   CARCINOMA, LUNG, SQUAMOUS CELL - aware  OSA (obstructive sleep apnea) - never did go through with full sleep testing.  Acute bronchitis - symptomatically resolving  Sinusitis - symptomatically resolved on Levaquin and steroids  Probable sinusitis - Levaquin 500 milligrams daily for a total of 7 days, day 4 of therapy - 3 more days as outpatient  Insomnia - continue Restoril as needed  Hypertension - continue on Benicar as an outpatient - likely not contributing to cough that much  Anxiety - controlled well with alprazolam  Activity - discharge home with light activity around the house  Disposition - discharge home on oral steroids as mentioned. 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 He has a history of lung cancer treated in COPD and stopped smoking in the last couple of years. The last month he's had bronchitis with bronchospasm and finally had to be admitted  a few days ago because of severe bronchospasm and shortness of breath. Chest x-ray was found to be stable and he has responded remarkably well to antibiotics, and IV Solu-Medrol and respiratory treatments. Chronic anxiety was well controlled with a past lambert and insomnia was controlled with temazepam. He is now ready for discharge      01/07/2014 1st [facility] office visit/ Todd Villanueva cc abrupt onset cough and sob while on ACEi Nov 2014 rx with prednisone and much better initially but still requiring neb tid daytime then much worse since finished prednisone.  Cough dry and hurts all over chest with cough  , cough is worse when lying down -  Sob walking 50 ft  No obvious day to day or daytime variabilty or assoc  r chest tightness, subjective wheeze overt sinus or hb symptoms. No unusual exp hx or h/o childhood pna/ asthma or knowledge of premature birth.  Also denies any obvious fluctuation of symptoms with weather or environmental changes or other aggravating or alleviating factors except as outlined above   Current Medications, Allergies, Complete Past Medical History, Past Surgical History, Family History, and Social History were reviewed in [employer] record.          Review of Systems  Constitutional: Negative for fever, chills, activity change, appetite change and unexpected weight change.  HENT: Negative for congestion, dental problem, postnasal drip, rhinorrhea, sneezing, sore throat, trouble swallowing and voice change.   Eyes: Negative  for visual disturbance.  Respiratory: Positive for cough and shortness of breath. Negative for choking.   Cardiovascular: Positive for chest pain. Negative for leg swelling.  Gastrointestinal: Negative for nausea, vomiting and abdominal pain.  Genitourinary: Negative for difficulty urinating.  Musculoskeletal: Negative for arthralgias.  Skin: Negative for rash.  Psychiatric/Behavioral: Negative for behavioral problems  and confusion.       Objective:   Physical Exam amb slt cushinghoid wm nad Wt Readings from Last 3 Encounters:  01/07/14 180 lb 9.6 oz (81.92 kg)  12/20/13 179 lb 11.2 oz (81.511 kg)  08/13/13 187 lb 4.8 oz (84.959 kg)     HEENT mild turbinate edema.  Oropharynx no thrush or excess pnd or cobblestoning.  No JVD or cervical adenopathy. Mild accessory muscle hypertrophy. Trachea midline, nl thryroid. Chest was hyperinflated by percussion with diminished breath sounds and moderate increased exp time without wheeze/ bronchial changes at R base.  Hoover sign positive at mid inspiration. Regular rate and rhythm without murmur gallop or rub or increase P2 - 2 Plus pitting edema.  Abd: no hsm, nl excursion. Ext warm without cyanosis or clubbing.    cxr 12/16/13  1. Stable postop unchanged in the right hemi thorax with right  basilar atelectasis small effusion.  2. Left lung is clear.  3. No change from prior        Assessment & Plan:

## 2014-01-07 NOTE — Patient Instructions (Addendum)
depomedrol 120 mg today  prilosec 20 mg take 2 Take 30- 60 min before your first and last meals of the day as long as your are coughing  For pain or cough Demerol 50 mg one - two every 4 hours as needed but don't drive  For swelling > lasix 20 mg one daily as needed   For breathing > nebulizer every 4 hours as needed   GERD (REFLUX)  is an extremely common cause of respiratory symptoms, many times with no significant heartburn at all.    It can be treated with medication, but also with lifestyle changes including avoidance of late meals, excessive alcohol, smoking cessation, and avoid fatty foods, chocolate, peppermint, colas, red wine, and acidic juices such as orange juice.  NO MINT OR MENTHOL PRODUCTS SO NO COUGH DROPS  USE SUGARLESS CANDY INSTEAD (jolley ranchers or Stover's)  NO OIL BASED VITAMINS - use powdered substitutes.    If not satisfied make appt to see Todd Villanueva with all meds in hand

## 2014-01-08 DIAGNOSIS — R0600 Dyspnea, unspecified: Secondary | ICD-10-CM | POA: Insufficient documentation

## 2014-01-08 NOTE — Assessment & Plan Note (Addendum)
PFTs  04/19/12  FEV1  1.82 (67%) ratio 71 and dlco 37 but corrects to 79%   Symptoms are markedly disproportionate to objective findings and not clear this is all a lung problem but pt does appear to have difficult airway management issues. DDX of  difficult airways managment all start with A and  include Adherence, Ace Inhibitors, Acid Reflux, Active Sinus Disease, Alpha 1 Antitripsin deficiency, Anxiety masquerading as Airways dz,  ABPA,  allergy(esp in young), Aspiration (esp in elderly), Adverse effects of DPI,  Active smokers, plus two Bs  = Bronchiectasis and Beta blocker use..and one C= CHF  Adherence is always the initial "prime suspect" and is a multilayered concern that requires a "trust but verify" approach in every patient - starting with knowing how to use medications, especially inhalers, correctly, keeping up with refills and understanding the fundamental difference between maintenance and prns vs those medications only taken for a very short course and then stopped and not refilled.  If not better needs to return for full med reconciliation   ACEi effect at top of list of suspects, d/c x 2 weeks but would leave off permanently  ? Acid (or non-acid) GERD > always difficult to exclude as up to 75% of pts in some series report no assoc GI/ Heartburn symptoms> rec max (24h)  acid suppression and diet restrictions/ reviewed and instructions given in writing.   ? Chf> no bnp on file, does have edema p rx with prednisone ? Mild cor pulmonale > rx prn lasix   ? Anxiety > typically a dx of exclusion but higher on the list

## 2014-01-08 NOTE — Assessment & Plan Note (Addendum)
Intolerant of spiriva, symbicort per previous entries- previoius pft's do not support significant copd though smoked subsequent to last set  Should just use the xopenex prn and see if off acei and on max gerd rx he gets to where, as before, he's not needing any maint rx x months at a time.

## 2014-01-27 ENCOUNTER — Emergency Department (HOSPITAL_COMMUNITY): Payer: 59

## 2014-01-27 ENCOUNTER — Encounter (HOSPITAL_COMMUNITY): Payer: Self-pay | Admitting: Emergency Medicine

## 2014-01-27 ENCOUNTER — Inpatient Hospital Stay (HOSPITAL_COMMUNITY)
Admission: EM | Admit: 2014-01-27 | Discharge: 2014-02-05 | DRG: 189 | Disposition: A | Discharge status: Home / Self Care | Payer: 59 | Attending: Internal Medicine | Admitting: Internal Medicine

## 2014-01-27 DIAGNOSIS — R778 Other specified abnormalities of plasma proteins: Secondary | ICD-10-CM

## 2014-01-27 DIAGNOSIS — J189 Pneumonia, unspecified organism: Secondary | ICD-10-CM | POA: Diagnosis present

## 2014-01-27 DIAGNOSIS — G4733 Obstructive sleep apnea (adult) (pediatric): Secondary | ICD-10-CM

## 2014-01-27 DIAGNOSIS — Z79899 Other long term (current) drug therapy: Secondary | ICD-10-CM

## 2014-01-27 DIAGNOSIS — J209 Acute bronchitis, unspecified: Secondary | ICD-10-CM

## 2014-01-27 DIAGNOSIS — IMO0002 Reserved for concepts with insufficient information to code with codable children: Secondary | ICD-10-CM

## 2014-01-27 DIAGNOSIS — I255 Ischemic cardiomyopathy: Secondary | ICD-10-CM

## 2014-01-27 DIAGNOSIS — J9620 Acute and chronic respiratory failure, unspecified whether with hypoxia or hypercapnia: Principal | ICD-10-CM

## 2014-01-27 DIAGNOSIS — D72829 Elevated white blood cell count, unspecified: Secondary | ICD-10-CM | POA: Diagnosis present

## 2014-01-27 DIAGNOSIS — N289 Disorder of kidney and ureter, unspecified: Secondary | ICD-10-CM | POA: Diagnosis not present

## 2014-01-27 DIAGNOSIS — D649 Anemia, unspecified: Secondary | ICD-10-CM | POA: Diagnosis present

## 2014-01-27 DIAGNOSIS — Z807 Family history of other malignant neoplasms of lymphoid, hematopoietic and related tissues: Secondary | ICD-10-CM

## 2014-01-27 DIAGNOSIS — E785 Hyperlipidemia, unspecified: Secondary | ICD-10-CM | POA: Diagnosis present

## 2014-01-27 DIAGNOSIS — J09X2 Influenza due to identified novel influenza A virus with other respiratory manifestations: Secondary | ICD-10-CM

## 2014-01-27 DIAGNOSIS — Z9861 Coronary angioplasty status: Secondary | ICD-10-CM

## 2014-01-27 DIAGNOSIS — Z836 Family history of other diseases of the respiratory system: Secondary | ICD-10-CM

## 2014-01-27 DIAGNOSIS — I10 Essential (primary) hypertension: Secondary | ICD-10-CM | POA: Diagnosis present

## 2014-01-27 DIAGNOSIS — J441 Chronic obstructive pulmonary disease with (acute) exacerbation: Secondary | ICD-10-CM

## 2014-01-27 DIAGNOSIS — J449 Chronic obstructive pulmonary disease, unspecified: Secondary | ICD-10-CM

## 2014-01-27 DIAGNOSIS — I2589 Other forms of chronic ischemic heart disease: Secondary | ICD-10-CM | POA: Diagnosis present

## 2014-01-27 DIAGNOSIS — F411 Generalized anxiety disorder: Secondary | ICD-10-CM | POA: Diagnosis present

## 2014-01-27 DIAGNOSIS — Z889 Allergy status to unspecified drugs, medicaments and biological substances status: Secondary | ICD-10-CM

## 2014-01-27 DIAGNOSIS — T380X5A Adverse effect of glucocorticoids and synthetic analogues, initial encounter: Secondary | ICD-10-CM | POA: Diagnosis not present

## 2014-01-27 DIAGNOSIS — J439 Emphysema, unspecified: Secondary | ICD-10-CM

## 2014-01-27 DIAGNOSIS — C3490 Malignant neoplasm of unspecified part of unspecified bronchus or lung: Secondary | ICD-10-CM

## 2014-01-27 DIAGNOSIS — R0600 Dyspnea, unspecified: Secondary | ICD-10-CM

## 2014-01-27 DIAGNOSIS — R599 Enlarged lymph nodes, unspecified: Secondary | ICD-10-CM | POA: Diagnosis present

## 2014-01-27 DIAGNOSIS — C3431 Malignant neoplasm of lower lobe, right bronchus or lung: Secondary | ICD-10-CM | POA: Diagnosis present

## 2014-01-27 DIAGNOSIS — I5043 Acute on chronic combined systolic (congestive) and diastolic (congestive) heart failure: Secondary | ICD-10-CM | POA: Diagnosis present

## 2014-01-27 DIAGNOSIS — S22069A Unspecified fracture of T7-T8 vertebra, initial encounter for closed fracture: Secondary | ICD-10-CM

## 2014-01-27 DIAGNOSIS — M8448XA Pathological fracture, other site, initial encounter for fracture: Secondary | ICD-10-CM | POA: Diagnosis present

## 2014-01-27 DIAGNOSIS — J9600 Acute respiratory failure, unspecified whether with hypoxia or hypercapnia: Secondary | ICD-10-CM

## 2014-01-27 DIAGNOSIS — I5041 Acute combined systolic (congestive) and diastolic (congestive) heart failure: Secondary | ICD-10-CM

## 2014-01-27 DIAGNOSIS — Z923 Personal history of irradiation: Secondary | ICD-10-CM

## 2014-01-27 DIAGNOSIS — T45515A Adverse effect of anticoagulants, initial encounter: Secondary | ICD-10-CM | POA: Diagnosis not present

## 2014-01-27 DIAGNOSIS — I2510 Atherosclerotic heart disease of native coronary artery without angina pectoris: Secondary | ICD-10-CM

## 2014-01-27 DIAGNOSIS — D696 Thrombocytopenia, unspecified: Secondary | ICD-10-CM | POA: Diagnosis present

## 2014-01-27 DIAGNOSIS — I509 Heart failure, unspecified: Secondary | ICD-10-CM | POA: Diagnosis present

## 2014-01-27 DIAGNOSIS — Z85118 Personal history of other malignant neoplasm of bronchus and lung: Secondary | ICD-10-CM

## 2014-01-27 DIAGNOSIS — R7989 Other specified abnormal findings of blood chemistry: Secondary | ICD-10-CM

## 2014-01-27 DIAGNOSIS — I498 Other specified cardiac arrhythmias: Secondary | ICD-10-CM | POA: Diagnosis not present

## 2014-01-27 DIAGNOSIS — Z9221 Personal history of antineoplastic chemotherapy: Secondary | ICD-10-CM

## 2014-01-27 DIAGNOSIS — I214 Non-ST elevation (NSTEMI) myocardial infarction: Secondary | ICD-10-CM

## 2014-01-27 DIAGNOSIS — S301XXA Contusion of abdominal wall, initial encounter: Secondary | ICD-10-CM | POA: Diagnosis not present

## 2014-01-27 DIAGNOSIS — R062 Wheezing: Secondary | ICD-10-CM

## 2014-01-27 DIAGNOSIS — Z87891 Personal history of nicotine dependence: Secondary | ICD-10-CM

## 2014-01-27 DIAGNOSIS — J1000 Influenza due to other identified influenza virus with unspecified type of pneumonia: Secondary | ICD-10-CM | POA: Diagnosis present

## 2014-01-27 DIAGNOSIS — N179 Acute kidney failure, unspecified: Secondary | ICD-10-CM | POA: Diagnosis not present

## 2014-01-27 LAB — URINALYSIS, ROUTINE W REFLEX MICROSCOPIC
Bilirubin Urine: NEGATIVE
Glucose, UA: NEGATIVE mg/dL
HGB URINE DIPSTICK: NEGATIVE
KETONES UR: 15 mg/dL — AB
Leukocytes, UA: NEGATIVE
Nitrite: NEGATIVE
PROTEIN: 100 mg/dL — AB
Specific Gravity, Urine: >1.046 — ABNORMAL HIGH (ref 1.005–1.030)
Urobilinogen, UA: 1 mg/dL (ref 0.0–1.0)
pH: 5.5 (ref 5.0–8.0)

## 2014-01-27 LAB — PROTIME-INR
INR: 0.96 (ref 0.00–1.49)
Prothrombin Time: 12.6 seconds (ref 11.6–15.2)

## 2014-01-27 LAB — CBC
HEMATOCRIT: 29.1 % — AB (ref 39.0–52.0)
Hemoglobin: 9.9 g/dL — ABNORMAL LOW (ref 13.0–17.0)
MCH: 32.9 pg (ref 26.0–34.0)
MCHC: 34 g/dL (ref 30.0–36.0)
MCV: 96.7 fL (ref 78.0–100.0)
PLATELETS: 134 10*3/uL — AB (ref 150–400)
RBC: 3.01 MIL/uL — AB (ref 4.22–5.81)
RDW: 15 % (ref 11.5–15.5)
WBC: 12.9 10*3/uL — AB (ref 4.0–10.5)

## 2014-01-27 LAB — PRO B NATRIURETIC PEPTIDE: PRO B NATRI PEPTIDE: 2835 pg/mL — AB (ref 0–125)

## 2014-01-27 LAB — I-STAT CHEM 8, ED
BUN: 27 mg/dL — AB (ref 6–23)
CHLORIDE: 98 meq/L (ref 96–112)
Calcium, Ion: 1.14 mmol/L (ref 1.13–1.30)
Creatinine, Ser: 1.3 mg/dL (ref 0.50–1.35)
Glucose, Bld: 104 mg/dL — ABNORMAL HIGH (ref 70–99)
HEMATOCRIT: 28 % — AB (ref 39.0–52.0)
Hemoglobin: 9.5 g/dL — ABNORMAL LOW (ref 13.0–17.0)
POTASSIUM: 3.8 meq/L (ref 3.7–5.3)
Sodium: 133 mEq/L — ABNORMAL LOW (ref 137–147)
TCO2: 26 mmol/L (ref 0–100)

## 2014-01-27 LAB — I-STAT TROPONIN, ED
TROPONIN I, POC: 0.42 ng/mL — AB (ref 0.00–0.08)
Troponin i, poc: 0.45 ng/mL (ref 0.00–0.08)
Troponin i, poc: 0.46 ng/mL (ref 0.00–0.08)

## 2014-01-27 LAB — URINE MICROSCOPIC-ADD ON

## 2014-01-27 LAB — TROPONIN I: TROPONIN I: 0.55 ng/mL — AB (ref <0.30)

## 2014-01-27 LAB — I-STAT CG4 LACTIC ACID, ED: Lactic Acid, Venous: 1.79 mmol/L (ref 0.5–2.2)

## 2014-01-27 MED ORDER — SODIUM CHLORIDE 0.9 % IV SOLN
1000.0000 mL | INTRAVENOUS | Status: DC
  Administered 2014-01-27: 1000 mL via INTRAVENOUS

## 2014-01-27 MED ORDER — VANCOMYCIN HCL 10 G IV SOLR
1500.0000 mg | Freq: Once | INTRAVENOUS | Status: AC
  Administered 2014-01-27: 1500 mg via INTRAVENOUS
  Filled 2014-01-27: qty 1500

## 2014-01-27 MED ORDER — HEPARIN BOLUS VIA INFUSION
4000.0000 [IU] | Freq: Once | INTRAVENOUS | Status: AC
  Administered 2014-01-27: 4000 [IU] via INTRAVENOUS
  Filled 2014-01-27: qty 4000

## 2014-01-27 MED ORDER — SODIUM CHLORIDE 0.9 % IJ SOLN
3.0000 mL | Freq: Two times a day (BID) | INTRAMUSCULAR | Status: DC
Start: 2014-01-27 — End: 2014-01-28
  Administered 2014-01-27: 3 mL via INTRAVENOUS

## 2014-01-27 MED ORDER — HEPARIN (PORCINE) IN NACL 100-0.45 UNIT/ML-% IJ SOLN
1300.0000 [IU]/h | INTRAMUSCULAR | Status: DC
  Administered 2014-01-27 – 2014-01-30 (×3): 1300 [IU]/h via INTRAVENOUS
  Filled 2014-01-27 (×3): qty 250

## 2014-01-27 MED ORDER — ALBUTEROL (5 MG/ML) CONTINUOUS INHALATION SOLN
10.0000 mg/h | INHALATION_SOLUTION | Freq: Once | RESPIRATORY_TRACT | Status: AC
  Administered 2014-01-27: 10 mg/h via RESPIRATORY_TRACT
  Filled 2014-01-27: qty 20

## 2014-01-27 MED ORDER — IPRATROPIUM BROMIDE 0.02 % IN SOLN
0.5000 mg | Freq: Once | RESPIRATORY_TRACT | Status: AC
Start: 2014-01-27 — End: 2014-01-27
  Administered 2014-01-27: 0.5 mg via RESPIRATORY_TRACT
  Filled 2014-01-27: qty 2.5

## 2014-01-27 MED ORDER — SODIUM CHLORIDE 0.9 % IV BOLUS (SEPSIS)
1000.0000 mL | Freq: Once | INTRAVENOUS | Status: AC
  Administered 2014-01-27: 1000 mL via INTRAVENOUS

## 2014-01-27 MED ORDER — VANCOMYCIN HCL 10 G IV SOLR: 1500.0000 mg | Freq: Once | INTRAVENOUS | Status: DC

## 2014-01-27 MED ORDER — SODIUM CHLORIDE 0.9 % IV SOLN: 250.0000 mL | INTRAVENOUS | Status: DC | PRN

## 2014-01-27 MED ORDER — ONDANSETRON HCL 4 MG/2ML IJ SOLN
4.0000 mg | Freq: Once | INTRAMUSCULAR | Status: AC
  Administered 2014-01-27: 4 mg via INTRAVENOUS
  Filled 2014-01-27: qty 2

## 2014-01-27 MED ORDER — IOHEXOL 350 MG/ML SOLN
100.0000 mL | Freq: Once | INTRAVENOUS | Status: AC | PRN
  Administered 2014-01-27: 100 mL via INTRAVENOUS

## 2014-01-27 MED ORDER — ALBUTEROL (5 MG/ML) CONTINUOUS INHALATION SOLN
10.0000 mg/h | INHALATION_SOLUTION | Freq: Once | RESPIRATORY_TRACT | Status: AC
  Administered 2014-01-28: 10 mg/h via RESPIRATORY_TRACT
  Filled 2014-01-27: qty 20

## 2014-01-27 MED ORDER — SODIUM CHLORIDE 0.9 % IV SOLN
INTRAVENOUS | Status: AC
  Administered 2014-01-28: via INTRAVENOUS

## 2014-01-27 MED ORDER — SODIUM CHLORIDE 0.9 % IJ SOLN: 3.0000 mL | INTRAMUSCULAR | Status: DC | PRN

## 2014-01-27 MED ORDER — ASPIRIN 81 MG PO CHEW
324.0000 mg | CHEWABLE_TABLET | Freq: Once | ORAL | Status: AC
  Administered 2014-01-27: 324 mg via ORAL
  Filled 2014-01-27: qty 4

## 2014-01-27 MED ORDER — PREDNISONE 20 MG PO TABS
60.0000 mg | ORAL_TABLET | Freq: Once | ORAL | Status: DC
Start: 2014-01-27 — End: 2014-01-27

## 2014-01-27 MED ORDER — PIPERACILLIN-TAZOBACTAM 3.375 G IVPB 30 MIN
3.3750 g | Freq: Once | INTRAVENOUS | Status: AC
  Administered 2014-01-27: 3.375 g via INTRAVENOUS
  Filled 2014-01-27: qty 50

## 2014-01-27 MED ORDER — METHYLPREDNISOLONE SODIUM SUCC 125 MG IJ SOLR
125.0000 mg | Freq: Once | INTRAMUSCULAR | Status: AC
  Administered 2014-01-27: 125 mg via INTRAVENOUS
  Filled 2014-01-27: qty 2

## 2014-01-27 NOTE — ED Notes (Signed)
Troponin results reported to [facility]

## 2014-01-27 NOTE — ED Notes (Signed)
Lactic Acid results reported to [facility]

## 2014-01-27 NOTE — ED Provider Notes (Signed)
CSN: [id number]     Arrival date & time 01/27/14  1719 History   First MD Initiated Contact with Patient 01/27/14 1728     Chief Complaint  Patient presents with  . Shortness of Breath  . Chest Pain  . Urinary Retention       HPI  73 yo M with hx of COPD , lung CA s/p chemo, HTN, HLD who presents with worsening SOB, CP and decreased urine out put. Discharged on 12th of Feb for COPD exacerbation. Was doing well at home until last Friday when he developed a cough, fever, chills. Cough has been productive of green sputum. Seen by PCP yesterday who started him on azithromycin. He reports decreased appetite 2/2 nausea.  Patient reports CP only when he coughs. Non radiating, no associated diaphoresis. Patient called EMS today because of worsening SOB. On arrival patient was sating in low 90's. EMS placed him on 2 L [place]. Patient uses O2 at ome but does not know how much.   Past Medical History  Diagnosis Date  . Hypertension   . lung ca dx'd 02/2011    xrt/chemo comp 03/30/11, stage IIIA  . Hyperlipidemia   . 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 Right vats, rt thoracotomy, rt middle and rt lower  lobectomy  06/10/2011    Dr Arlyce Dice  . Knee arthroscopy    . Tonsillectomy    . Coronary stent placement  JULY,AUGUST 2011  . Orif femoral neck fracture w/ dhs     Family History  Problem Relation Age of Onset  . Cancer Father     multiple myeloma  . COPD Sister     emphysema- smoked  . Cancer Brother     multiple myeloma   History  Substance Use Topics  . Smoking status: Former Smoker -- 0.50 packs/day for 53 years    Types: Cigarettes    Quit date: 10/05/2013  . Smokeless tobacco: Never Used  . Alcohol Use: Yes     Comment: occasionally    Review of Systems  Constitutional: Positive for fever and chills. Negative for activity change and appetite change.   HENT: Negative for congestion, ear pain, rhinorrhea, sinus pressure and sore throat.   Eyes: Negative for pain and redness.  Respiratory: Positive for cough, shortness of breath and wheezing. Negative for chest tightness.   Cardiovascular: Negative for chest pain and palpitations.  Gastrointestinal: Negative for nausea, vomiting, abdominal pain, diarrhea and abdominal distention.  Genitourinary: Negative for dysuria, flank pain and difficulty urinating.  Musculoskeletal: Negative for back pain, neck pain and neck stiffness.  Skin: Negative for rash and wound.  Neurological: Negative for dizziness, light-headedness, numbness and headaches.  Hematological: Negative for adenopathy.  Psychiatric/Behavioral: Negative for behavioral problems, confusion and agitation.      Allergies  Hydrocodone-acetaminophen; Oxycodone; Tramadol; and Zolpidem tartrate  Home Medications   Current Outpatient Rx  Name  Route  Sig  Dispense  Refill  . ALPRAZolam (XANAX) 0.25 MG tablet   Oral   Take 0.25 mg by mouth as needed for anxiety (prior to opthalmic injections.).         Marland Kitchen bevacizumab (AVASTIN) 1.25 mg/0.1 mL SOLN   Intravitreal   1.25 mg by Intravitreal route every 30 (thirty) days.         . citalopram (CELEXA) 20 MG tablet  Oral   Take 20 mg by mouth daily.           Marland Kitchen FeFum-FePoly-FA-B Cmp-C-Biot (FOLIVANE-PLUS) CAPS   Oral   Take 1 capsule by mouth daily.   90 capsule   1   . furosemide (LASIX) 20 MG tablet      One daily as needed for leg sweeling   30 tablet   11   . levalbuterol (XOPENEX) 0.63 MG/3ML nebulizer solution   Nebulization   Take 0.63 mg by nebulization 3 (three) times daily as needed for wheezing or shortness of breath.         . meperidine (DEMEROL) 50 MG tablet      One to two every four hours as needed for pain or painful coughing   60 tablet   0   . olmesartan (BENICAR) 20 MG tablet   Oral   Take 20 mg by mouth daily.         Marland Kitchen omeprazole  (PRILOSEC) 20 MG capsule   Oral   Take 20 mg by mouth daily.           . simvastatin (ZOCOR) 40 MG tablet   Oral   Take 40 mg by mouth every Monday, Wednesday, and Friday.          . temazepam (RESTORIL) 30 MG capsule   Oral   Take 1 capsule (30 mg total) by mouth at bedtime as needed for sleep.   15 capsule   1    BP 138/79  Pulse 118  Temp(Src) 98.5 F (36.9 C) (Oral)  Resp 23  Ht 5' 6.5" (1.689 m)  Wt 187 lb (84.823 kg)  BMI 29.73 kg/m2  SpO2 98% Physical Exam  Constitutional: He is oriented to person, place, and time. He appears well-developed and well-nourished. No distress.  HENT:  Head: Normocephalic and atraumatic.  Nose: Nose normal.  Mouth/Throat: Oropharynx is clear and moist.  Eyes: Conjunctivae and EOM are normal. Pupils are equal, round, and reactive to light.  Neck: Normal range of motion. Neck supple. No tracheal deviation present.  Cardiovascular: Regular rhythm, normal heart sounds and intact distal pulses.   Tachycardia   Pulmonary/Chest: He is in respiratory distress. He has wheezes. He has rales ( RL).  tachypneic   Abdominal: Soft. Bowel sounds are normal. He exhibits no distension. There is no tenderness. There is no rebound and no guarding.  Musculoskeletal: Normal range of motion. He exhibits tenderness ( T spine TTP). He exhibits no edema.  Neurological: He is alert and oriented to person, place, and time.  Skin: Skin is warm and dry.  Psychiatric: He has a normal mood and affect. His behavior is normal.    ED Course  Procedures (including critical care time) Labs Review Results for orders placed during the hospital encounter of 01/27/14  CBC      Result Value Ref Range   WBC 12.9 (*) 4.0 - 10.5 K/uL   RBC 3.01 (*) 4.22 - 5.81 MIL/uL   Hemoglobin 9.9 (*) 13.0 - 17.0 g/dL   HCT 29.1 (*) 39.0 - 52.0 %   MCV 96.7  78.0 - 100.0 fL   MCH 32.9  26.0 - 34.0 pg   MCHC 34.0  30.0 - 36.0 g/dL   RDW 15.0  11.5 - 15.5 %   Platelets 134 (*)  150 - 400 K/uL  PRO B NATRIURETIC PEPTIDE      Result Value Ref Range   Pro B Natriuretic peptide (BNP) 2835.0 (*)  0 - 125 pg/mL  PROTIME-INR      Result Value Ref Range   Prothrombin Time 12.6  11.6 - 15.2 seconds   INR 0.96  0.00 - 1.49  URINALYSIS, ROUTINE W REFLEX MICROSCOPIC      Result Value Ref Range   Color, Urine YELLOW  YELLOW   APPearance CLEAR  CLEAR   Specific Gravity, Urine >1.046 (*) 1.005 - 1.030   pH 5.5  5.0 - 8.0   Glucose, UA NEGATIVE  NEGATIVE mg/dL   Hgb urine dipstick NEGATIVE  NEGATIVE   Bilirubin Urine NEGATIVE  NEGATIVE   Ketones, ur 15 (*) NEGATIVE mg/dL   Protein, ur 100 (*) NEGATIVE mg/dL   Urobilinogen, UA 1.0  0.0 - 1.0 mg/dL   Nitrite NEGATIVE  NEGATIVE   Leukocytes, UA NEGATIVE  NEGATIVE  TROPONIN I      Result Value Ref Range   Troponin I 0.55 (*) <0.30 ng/mL  URINE MICROSCOPIC-ADD ON      Result Value Ref Range   WBC, UA 0-2  <3 WBC/hpf   Casts GRANULAR CAST (*) NEGATIVE  I-STAT CHEM 8, ED      Result Value Ref Range   Sodium 133 (*) 137 - 147 mEq/L   Potassium 3.8  3.7 - 5.3 mEq/L   Chloride 98  96 - 112 mEq/L   BUN 27 (*) 6 - 23 mg/dL   Creatinine, Ser 1.30  0.50 - 1.35 mg/dL   Glucose, Bld 104 (*) 70 - 99 mg/dL   Calcium, Ion 1.14  1.13 - 1.30 mmol/L   TCO2 26  0 - 100 mmol/L   Hemoglobin 9.5 (*) 13.0 - 17.0 g/dL   HCT 28.0 (*) 39.0 - 52.0 %  I-STAT TROPOININ, ED      Result Value Ref Range   Troponin i, poc 0.42 (*) 0.00 - 0.08 ng/mL   Comment NOTIFIED PHYSICIAN     Comment 3           I-STAT CG4 LACTIC ACID, ED      Result Value Ref Range   Lactic Acid, Venous 1.79  0.5 - 2.2 mmol/L  I-STAT TROPOININ, ED      Result Value Ref Range   Troponin i, poc 0.45 (*) 0.00 - 0.08 ng/mL   Comment NOTIFIED PHYSICIAN     Comment 3             Imaging Review Ct Angio Chest Pe W/cm &/or Wo Cm  01/27/2014   CLINICAL DATA:  Lung cancer, prior right middle and lower lobectomies. Shortness of breath. Cough. Chest pain.  EXAM: CT  ANGIOGRAPHY CHEST WITH CONTRAST  TECHNIQUE: Multidetector CT imaging of the chest was performed using the standard protocol during bolus administration of intravenous contrast. Multiplanar CT image reconstructions and MIPs were obtained to evaluate the vascular anatomy.  CONTRAST:  152m OMNIPAQUE IOHEXOL 350 MG/ML SOLN  COMPARISON:  DG CHEST 1V PORT dated 01/27/2014; CT CHEST W/CM dated 08/12/2013; CT CHEST W/CM dated 02/11/2013; DG CHEST 2 VIEW dated 12/16/2013  FINDINGS: The patient received 2 boluses of contrast. The first was a 50 cc bolus of Omnipaque 350 which triggered the pressure limit. Repeated exam with 80 cc of contrast performed with much better opacification of the vascular structures. Similar truncated appearance of a right pulmonary artery due to the lobectomies. No visualized filling defects in the remaining right pulmonary arterial tree.  A mixed density appearance in the left lower lobe pulmonary arterial structures appears to correlate directly with  readily observable motion artifact on the lung window images, and does not have a characteristic morphology for pulmonary embolus. Accordingly, there is abnormal hypodensity is attributed to artifact for example on image 165 of series 16.  Right upper paratracheal lymph node, 1.5 cm in short axis on image 18 of series 15 (formerly 1.1 cm). Left prevascular node 0.9 cm in short axis (formerly 0.7 cm).  Coronary atherosclerosis. Thick pleural enhancement on the right side with associated pleural fluid. Increase in interstitial and airspace opacity in the base of the right lung as on image 56 of series 17. Multiple right rib fractures or osteotomies, similar to prior. Prominent emphysema noted. 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  There is a new 90% compression fracture at T7 with 3 mm of posterior retropulsion contributing to central stenosis. . Benign compression fracture  favored over pathologic fracture particularly in light of the relatively normal appearance of this vertebra on 08/12/2013.  Review of the MIP images confirms the above findings.  IMPRESSION: 1. No embolus identified. 2. Enlarging right upper paratracheal lymph node, 1.5 cm in short axis, possibly reactive or malignant. 3. Increasing airspace opacity at the inferior margin of the remaining right upper lobe, nonspecific but potentially from radiation pneumonitis, pneumonia, or spread of tumor. 4. Acute 90% compression fracture of the T7 vertebra with 3 mm of posterior retropulsion contributing to central stenosis. It is 5. Coronary atherosclerosis. 6. Thick pleural enhancement on the right side with right pleural effusion, similar to prior. 7. Multiple right rib fractures are osteotomies, similar to prior. 8. Prominent emphysema. 9. Airway thickening along the right hilum with occlusion or near occlusion of the right tracheobronchial tree in this vicinity.   Electronically Signed   By: Sherryl Barters M.D.   On: 01/27/2014 21:10   Dg Chest Portable 1 View  01/27/2014   CLINICAL DATA:  Shortness breath.  History of lung cancer.  EXAM: PORTABLE CHEST - 1 VIEW  COMPARISON:  12/16/2013.  FINDINGS: Stable post therapy changes right lung including multiple remote right-sided rib fractures (some which are not completely healed). Mediastinal shift to the right. Limited for evaluating for recurrent tumor or detecting underlying infiltrate/ atelectasis/ pleural effusion given the postoperative changes.  Calcified mildly tortuous aorta.  Mild pulmonary vascular prominence without frank pulmonary edema.  IMPRESSION: Stable post therapy exam as noted above.   Electronically Signed   By: Chauncey Cruel M.D.   On: 01/27/2014 18:31    EKG Interpretation    Date/Time:  Monday January 27 2014 17:28:36 EST Ventricular Rate:  120 PR Interval:  144 QRS Duration: 99 QT Interval:  314 QTC Calculation: 444 R Axis:   -3 Text  Interpretation:  Sinus tachycardia Inferior infarct, old Probable anterolateral infarct, old When compared with ECG of 12/16/2013, Anterolateral infarct and  Inferior infarct , old are now Present Confirmed by [facility]  MD, DAVID ([id number]) on 01/27/2014 5:46:28 PM            MDM   Final diagnoses:  Wheeze  HCAP (healthcare-associated pneumonia)  Compression fracture  COPD exacerbation  Elevated troponin   73 yo M with R sided pleuritic CP. Known rib fractures. Patient also reported Mid T spine TTP on exam. CXR concerning for R sided PNA. CT scan obtained to r/o PE. CT study with no PE. PNA on R side. Started on Vanc/Zosyn for HCAP coverage. Troponins elevated with new EKG changes as noted  above. Started on Heparin gtt. On presentation patient had significant wheeze on exam albuterol and atrovent given with improvement in respiratory status. Solumedrol given.   Feel this CP is likely 2/2 PNA less likely ACS. No PE on CT scan.   12:05 AM Spoke with Dr. Algernon Huxley (Cardiologist on call) who reccommended continuing the heparin gtt and trending enzymes overnight. Will consult on patient in the morning.   Case discussed with my attending Dr. Roxanne Mins. Patient admitted to hospitalist service for further care. Of note T7 compression fracture noted on CT scan. Patient TTP in the affected area. No neurologic deficits noted. Patient denies back pain unless palpated.   Patient up dated multiple times throughout ED course. At transfer patient is HDS.    Ruthell Rummage, MD 01/28/14 [phone]

## 2014-01-27 NOTE — Progress Notes (Signed)
10mg  albuterol continuous neb and atrovent given.

## 2014-01-27 NOTE — Consult Note (Signed)
ANTICOAGULATION CONSULT NOTE - Initial Consult  Pharmacy Consult for Heparin Indication: NSTEMI vs PE  Allergies  Allergen Reactions  . Hydrocodone-Acetaminophen     REACTION: Itch  . Oxycodone Itching  . Tramadol     Pt states will not take med b/c it does not work   . Zolpidem Tartrate Other (See Comments)    nightmares    Patient Measurements: Height: 5' 6.5" (168.9 cm) Weight: 187 lb (84.823 kg) IBW/kg (Calculated) : 64.95 Heparin Dosing Weight: ~82kg  Vital Signs: Temp: 98.8 F (37.1 C) (02/23 1934) Temp src: Oral (02/23 1934) BP: 96/49 mmHg (02/23 1945) Pulse Rate: 125 (02/23 1945)  Labs:  Recent Labs  01/27/14 1740 01/27/14 1812  HGB 9.9* 9.5*  HCT 29.1* 28.0*  PLT 134*  --   LABPROT 12.6  --   INR 0.96  --   CREATININE  --  1.30    Estimated Creatinine Clearance: 54.5 ml/min (by C-G formula based on Cr of 1.3).   Medical History: Past Medical History  Diagnosis Date  . Hypertension   . lung ca dx'd 02/2011    xrt/chemo comp 03/30/11, stage IIIA  . Hyperlipidemia   . 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 Was started on an azithromycin yesterday by his PCP for cough but did not improve. First two troponins are positive. He will begin IV heparin for NSTEMI versus PE (CT angio pending). Creatinine is slightly elevated at 1.3. He is also anemic with Hgb 9.5 and platelets 134.   Goal of Therapy:  Heparin level 0.3-0.7 units/ml Monitor platelets by anticoagulation protocol: Yes   Plan:  1) Heparin bolus 4000 units x 1 2) Heparin drip at 1300 units/hr 3) Check 6 hour heparin level 4) Daily heparin level and CBC 5) Follow up CT angio  Deboraha Sprang 01/27/2014,8:32 PM

## 2014-01-27 NOTE — ED Notes (Signed)
NOTIFIED DR. GLICK OF PATIENTS LAB RESULTS OF I-STAT TROPONIN @18 :24 PM ,01/27/2014.

## 2014-01-27 NOTE — ED Notes (Signed)
Troponin results reported to [facility]

## 2014-01-27 NOTE — ED Notes (Signed)
Patient reported to have onset of not feeling well.  Patient states he has had increasing cough since Friday.  Patient has productive cough, green in color.  Patient has hx of lung cancer.  Patient also has hx of copd.  Patient called his MD and was started on antibiotic on yesterday.  Patient called his MD today due to having increased sob.  He was able to speak only a few words.  Patient with chest pain with cough.  He also reports decreased urine output. Patient states he has had decreased po intake as well due to not feeling.  Patient with onset of diarrhea today.  Patient states he feels dehydrated.  Patient did a home neb treatment albuterol prior to arrival.  He is on oxygen at night and continuous during the day with activity.

## 2014-01-27 NOTE — ED Provider Notes (Signed)
73 year old male has been having cough and dyspnea over the last 3 days which has been getting worse. He did have chills 2 days ago. Cough is nonproductive. Of note, he has chest pain which has been related to a rib fractures. He is also 3 years disease-free survival of lung cancer. He did receive influenza immunization this year. On exam, he is tachycardic but resting comfortably and maintaining good oxygen saturations. Lungs have faint expiratory wheezes. No rales are heard. Heart is regular rate and rhythm and tachycardic. Extremities no cyanosis or edema. Chest x-ray was read by radiologist as showing some vascular congestion but I am suspicious for a right middle lobe infiltrate. He because of tachycardia and pleuritic chest pain, he will be sent for CT angiogram to rule out pulmonary embolism. ECG is unremarkable but the troponin has come back elevated. He is started on anticoagulation.  CT appears to confirm pneumonia, no evidence of PE. Elevated troponin is felt likely secondary to demand ischemia, cardiology is consulted. Compression fracture seen on CT likely explains his recent pain. Blood pressure did drop and he was given fluid boluses. BP remains soft, but acceptable. Lactate is normal. He is admitted for HCAP.  Results for orders placed during the hospital encounter of 01/27/14  MRSA PCR SCREENING      Result Value Ref Range   MRSA by PCR NEGATIVE  NEGATIVE  CBC      Result Value Ref Range   WBC 12.9 (*) 4.0 - 10.5 K/uL   RBC 3.01 (*) 4.22 - 5.81 MIL/uL   Hemoglobin 9.9 (*) 13.0 - 17.0 g/dL   HCT 29.1 (*) 39.0 - 52.0 %   MCV 96.7  78.0 - 100.0 fL   MCH 32.9  26.0 - 34.0 pg   MCHC 34.0  30.0 - 36.0 g/dL   RDW 15.0  11.5 - 15.5 %   Platelets 134 (*) 150 - 400 K/uL  PRO B NATRIURETIC PEPTIDE      Result Value Ref Range   Pro B Natriuretic peptide (BNP) 2835.0 (*) 0 - 125 pg/mL  PROTIME-INR      Result Value Ref Range   Prothrombin Time 12.6  11.6 - 15.2 seconds   INR 0.96  0.00 -  1.49  URINALYSIS, ROUTINE W REFLEX MICROSCOPIC      Result Value Ref Range   Color, Urine YELLOW  YELLOW   APPearance CLEAR  CLEAR   Specific Gravity, Urine >1.046 (*) 1.005 - 1.030   pH 5.5  5.0 - 8.0   Glucose, UA NEGATIVE  NEGATIVE mg/dL   Hgb urine dipstick NEGATIVE  NEGATIVE   Bilirubin Urine NEGATIVE  NEGATIVE   Ketones, ur 15 (*) NEGATIVE mg/dL   Protein, ur 100 (*) NEGATIVE mg/dL   Urobilinogen, UA 1.0  0.0 - 1.0 mg/dL   Nitrite NEGATIVE  NEGATIVE   Leukocytes, UA NEGATIVE  NEGATIVE  TROPONIN I      Result Value Ref Range   Troponin I 0.55 (*) <0.30 ng/mL  HEPARIN LEVEL (UNFRACTIONATED)      Result Value Ref Range   Heparin Unfractionated 0.48  0.30 - 0.70 IU/mL  CBC      Result Value Ref Range   WBC 10.5  4.0 - 10.5 K/uL   RBC 2.57 (*) 4.22 - 5.81 MIL/uL   Hemoglobin 8.5 (*) 13.0 - 17.0 g/dL   HCT 25.1 (*) 39.0 - 52.0 %   MCV 97.7  78.0 - 100.0 fL   MCH 33.1  26.0 -  34.0 pg   MCHC 33.9  30.0 - 36.0 g/dL   RDW 15.4  11.5 - 15.5 %   Platelets 121 (*) 150 - 400 K/uL  URINE MICROSCOPIC-ADD ON      Result Value Ref Range   WBC, UA 0-2  <3 WBC/hpf   Casts GRANULAR CAST (*) NEGATIVE  TROPONIN I      Result Value Ref Range   Troponin I 0.66 (*) <0.30 ng/mL  TROPONIN I      Result Value Ref Range   Troponin I 1.31 (*) <0.30 ng/mL  COMPREHENSIVE METABOLIC PANEL      Result Value Ref Range   Sodium 137  137 - 147 mEq/L   Potassium 3.6 (*) 3.7 - 5.3 mEq/L   Chloride 101  96 - 112 mEq/L   CO2 17 (*) 19 - 32 mEq/L   Glucose, Bld 298 (*) 70 - 99 mg/dL   BUN 26 (*) 6 - 23 mg/dL   Creatinine, Ser 1.26  0.50 - 1.35 mg/dL   Calcium 8.0 (*) 8.4 - 10.5 mg/dL   Total Protein 6.2  6.0 - 8.3 g/dL   Albumin 2.3 (*) 3.5 - 5.2 g/dL   AST 25  0 - 37 U/L   ALT 26  0 - 53 U/L   Alkaline Phosphatase 156 (*) 39 - 117 U/L   Total Bilirubin 0.4  0.3 - 1.2 mg/dL   GFR calc non Af Amer 56 (*) >90 mL/min   GFR calc Af Amer 65 (*) >90 mL/min  CBC WITH DIFFERENTIAL      Result Value  Ref Range   WBC 8.8  4.0 - 10.5 K/uL   RBC 2.44 (*) 4.22 - 5.81 MIL/uL   Hemoglobin 8.1 (*) 13.0 - 17.0 g/dL   HCT 23.7 (*) 39.0 - 52.0 %   MCV 97.1  78.0 - 100.0 fL   MCH 33.2  26.0 - 34.0 pg   MCHC 34.2  30.0 - 36.0 g/dL   RDW 15.3  11.5 - 15.5 %   Platelets 124 (*) 150 - 400 K/uL   Neutrophils Relative % 97 (*) 43 - 77 %   Neutro Abs 8.5 (*) 1.7 - 7.7 K/uL   Lymphocytes Relative 2 (*) 12 - 46 %   Lymphs Abs 0.1 (*) 0.7 - 4.0 K/uL   Monocytes Relative 2 (*) 3 - 12 %   Monocytes Absolute 0.1  0.1 - 1.0 K/uL   Eosinophils Relative 0  0 - 5 %   Eosinophils Absolute 0.0  0.0 - 0.7 K/uL   Basophils Relative 0  0 - 1 %   Basophils Absolute 0.0  0.0 - 0.1 K/uL  TSH      Result Value Ref Range   TSH 0.258 (*) 0.350 - 4.500 uIU/mL  PROCALCITONIN      Result Value Ref Range   Procalcitonin 3.68    I-STAT CHEM 8, ED      Result Value Ref Range   Sodium 133 (*) 137 - 147 mEq/L   Potassium 3.8  3.7 - 5.3 mEq/L   Chloride 98  96 - 112 mEq/L   BUN 27 (*) 6 - 23 mg/dL   Creatinine, Ser 1.30  0.50 - 1.35 mg/dL   Glucose, Bld 104 (*) 70 - 99 mg/dL   Calcium, Ion 1.14  1.13 - 1.30 mmol/L   TCO2 26  0 - 100 mmol/L   Hemoglobin 9.5 (*) 13.0 - 17.0 g/dL   HCT 28.0 (*) 39.0 -  52.0 %  I-STAT TROPOININ, ED      Result Value Ref Range   Troponin i, poc 0.42 (*) 0.00 - 0.08 ng/mL   Comment NOTIFIED PHYSICIAN     Comment 3           I-STAT CG4 LACTIC ACID, ED      Result Value Ref Range   Lactic Acid, Venous 1.79  0.5 - 2.2 mmol/L  I-STAT TROPOININ, ED      Result Value Ref Range   Troponin i, poc 0.45 (*) 0.00 - 0.08 ng/mL   Comment NOTIFIED PHYSICIAN     Comment 3           I-STAT TROPOININ, ED      Result Value Ref Range   Troponin i, poc 0.46 (*) 0.00 - 0.08 ng/mL   Comment NOTIFIED PHYSICIAN     Comment 3           TYPE AND SCREEN      Result Value Ref Range   ABO/RH(D) O POS     Antibody Screen NEG     Sample Expiration 01/31/2014     Unit Number [id number]     Blood  Component Type RED CELLS,LR     Unit division 00     Status of Unit ISSUED     Transfusion Status OK TO TRANSFUSE     Crossmatch Result Compatible     Unit Number [id number]     Blood Component Type RBC LR PHER2     Unit division 00     Status of Unit ISSUED     Transfusion Status OK TO TRANSFUSE     Crossmatch Result Compatible    PREPARE RBC (CROSSMATCH)      Result Value Ref Range   Order Confirmation ORDER PROCESSED BY BLOOD BANK     Ct Angio Chest Pe W/cm &/or Wo Cm  01/27/2014   CLINICAL DATA:  Lung cancer, prior right middle and lower lobectomies. Shortness of breath. Cough. Chest pain.  EXAM: CT ANGIOGRAPHY CHEST WITH CONTRAST  TECHNIQUE: Multidetector CT imaging of the chest was performed using the standard protocol during bolus administration of intravenous contrast. Multiplanar CT image reconstructions and MIPs were obtained to evaluate the vascular anatomy.  CONTRAST:  12mL OMNIPAQUE IOHEXOL 350 MG/ML SOLN  COMPARISON:  DG CHEST 1V PORT dated 01/27/2014; CT CHEST W/CM dated 08/12/2013; CT CHEST W/CM dated 02/11/2013; DG CHEST 2 VIEW dated 12/16/2013  FINDINGS: The patient received 2 boluses of contrast. The first was a 50 cc bolus of Omnipaque 350 which triggered the pressure limit. Repeated exam with 80 cc of contrast performed with much better opacification of the vascular structures. Similar truncated appearance of a right pulmonary artery due to the lobectomies. No visualized filling defects in the remaining right pulmonary arterial tree.  A mixed density appearance in the left lower lobe pulmonary arterial structures appears to correlate directly with readily observable motion artifact on the lung window images, and does not have a characteristic morphology for pulmonary embolus. Accordingly, there is abnormal hypodensity is attributed to artifact for example on image 165 of series 16.  Right upper paratracheal lymph node, 1.5 cm in short axis on image 18 of series 15 (formerly 1.1  cm). Left prevascular node 0.9 cm in short axis (formerly 0.7 cm).  Coronary atherosclerosis. Thick pleural enhancement on the right side with associated pleural fluid. Increase in interstitial and airspace opacity in the base of the right lung as on image 56 of  series 17. Multiple right rib fractures or osteotomies, similar to prior. Prominent emphysema noted. 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  There is a new 90% compression fracture at T7 with 3 mm of posterior retropulsion contributing to central stenosis. . Benign compression fracture favored over pathologic fracture particularly in light of the relatively normal appearance of this vertebra on 08/12/2013.  Review of the MIP images confirms the above findings.  IMPRESSION: 1. No embolus identified. 2. Enlarging right upper paratracheal lymph node, 1.5 cm in short axis, possibly reactive or malignant. 3. Increasing airspace opacity at the inferior margin of the remaining right upper lobe, nonspecific but potentially from radiation pneumonitis, pneumonia, or spread of tumor. 4. Acute 90% compression fracture of the T7 vertebra with 3 mm of posterior retropulsion contributing to central stenosis. It is 5. Coronary atherosclerosis. 6. Thick pleural enhancement on the right side with right pleural effusion, similar to prior. 7. Multiple right rib fractures are osteotomies, similar to prior. 8. Prominent emphysema. 9. Airway thickening along the right hilum with occlusion or near occlusion of the right tracheobronchial tree in this vicinity.   Electronically Signed   By: Sherryl Barters M.D.   On: 01/27/2014 21:10   Dg Chest Portable 1 View  01/27/2014   CLINICAL DATA:  Shortness breath.  History of lung cancer.  EXAM: PORTABLE CHEST - 1 VIEW  COMPARISON:  12/16/2013.  FINDINGS: Stable post therapy changes right lung including multiple remote right-sided rib fractures (some which  are not completely healed). Mediastinal shift to the right. Limited for evaluating for recurrent tumor or detecting underlying infiltrate/ atelectasis/ pleural effusion given the postoperative changes.  Calcified mildly tortuous aorta.  Mild pulmonary vascular prominence without frank pulmonary edema.  IMPRESSION: Stable post therapy exam as noted above.   Electronically Signed   By: Chauncey Cruel M.D.   On: 01/27/2014 18:31   Images viewed by me.  CRITICAL CARE Performed by: [email or web address] Total critical care time: 65 minutes Critical care time was exclusive of separately billable procedures and treating other patients. Critical care was necessary to treat or prevent imminent or life-threatening deterioration. Critical care was time spent personally by me on the following activities: development of treatment plan with patient and/or surrogate as well as nursing, discussions with consultants, evaluation of patient's response to treatment, examination of patient, obtaining history from patient or surrogate, ordering and performing treatments and interventions, ordering and review of laboratory studies, ordering and review of radiographic studies, pulse oximetry and re-evaluation of patient's condition.  I saw and evaluated the patient, reviewed the resident's note and I agree with the findings and plan.  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

## 2014-01-27 NOTE — ED Notes (Signed)
Patient informed that a urine sample is needed- patient states that he is unable to give one at this time.

## 2014-01-28 ENCOUNTER — Other Ambulatory Visit: Payer: Self-pay | Admitting: Internal Medicine

## 2014-01-28 ENCOUNTER — Encounter (HOSPITAL_COMMUNITY): Payer: Self-pay | Admitting: Internal Medicine

## 2014-01-28 DIAGNOSIS — C3430 Malignant neoplasm of lower lobe, unspecified bronchus or lung: Secondary | ICD-10-CM

## 2014-01-28 DIAGNOSIS — J189 Pneumonia, unspecified organism: Secondary | ICD-10-CM

## 2014-01-28 DIAGNOSIS — I214 Non-ST elevation (NSTEMI) myocardial infarction: Secondary | ICD-10-CM | POA: Diagnosis present

## 2014-01-28 DIAGNOSIS — C3490 Malignant neoplasm of unspecified part of unspecified bronchus or lung: Secondary | ICD-10-CM

## 2014-01-28 DIAGNOSIS — I10 Essential (primary) hypertension: Secondary | ICD-10-CM | POA: Diagnosis present

## 2014-01-28 DIAGNOSIS — E785 Hyperlipidemia, unspecified: Secondary | ICD-10-CM | POA: Diagnosis present

## 2014-01-28 DIAGNOSIS — D649 Anemia, unspecified: Secondary | ICD-10-CM | POA: Diagnosis present

## 2014-01-28 DIAGNOSIS — J9600 Acute respiratory failure, unspecified whether with hypoxia or hypercapnia: Secondary | ICD-10-CM | POA: Insufficient documentation

## 2014-01-28 DIAGNOSIS — I517 Cardiomegaly: Secondary | ICD-10-CM

## 2014-01-28 DIAGNOSIS — J9620 Acute and chronic respiratory failure, unspecified whether with hypoxia or hypercapnia: Principal | ICD-10-CM

## 2014-01-28 DIAGNOSIS — S22069A Unspecified fracture of T7-T8 vertebra, initial encounter for closed fracture: Secondary | ICD-10-CM | POA: Diagnosis present

## 2014-01-28 DIAGNOSIS — R062 Wheezing: Secondary | ICD-10-CM

## 2014-01-28 DIAGNOSIS — J441 Chronic obstructive pulmonary disease with (acute) exacerbation: Secondary | ICD-10-CM

## 2014-01-28 LAB — TSH: TSH: 0.258 u[IU]/mL — ABNORMAL LOW (ref 0.350–4.500)

## 2014-01-28 LAB — HEPARIN LEVEL (UNFRACTIONATED)
Heparin Unfractionated: 0.48 IU/mL (ref 0.30–0.70)
Heparin Unfractionated: 0.47 IU/mL (ref 0.30–0.70)

## 2014-01-28 LAB — CBC
HCT: 25.1 % — ABNORMAL LOW (ref 39.0–52.0)
HCT: 29.2 % — ABNORMAL LOW (ref 39.0–52.0)
Hemoglobin: 8.5 g/dL — ABNORMAL LOW (ref 13.0–17.0)
Hemoglobin: 10.2 g/dL — ABNORMAL LOW (ref 13.0–17.0)
MCH: 33.1 pg (ref 26.0–34.0)
MCH: 32.3 pg (ref 26.0–34.0)
MCHC: 33.9 g/dL (ref 30.0–36.0)
MCHC: 34.9 g/dL (ref 30.0–36.0)
MCV: 97.7 fL (ref 78.0–100.0)
MCV: 92.4 fL (ref 78.0–100.0)
PLATELETS: 121 10*3/uL — AB (ref 150–400)
PLATELETS: 120 10*3/uL — AB (ref 150–400)
RBC: 2.57 MIL/uL — ABNORMAL LOW (ref 4.22–5.81)
RBC: 3.16 MIL/uL — ABNORMAL LOW (ref 4.22–5.81)
RDW: 15.4 % (ref 11.5–15.5)
RDW: 15.7 % — AB (ref 11.5–15.5)
WBC: 10.5 10*3/uL (ref 4.0–10.5)
WBC: 13.4 10*3/uL — ABNORMAL HIGH (ref 4.0–10.5)

## 2014-01-28 LAB — CBC WITH DIFFERENTIAL/PLATELET
BASOS ABS: 0 10*3/uL (ref 0.0–0.1)
BASOS PCT: 0 % (ref 0–1)
Eosinophils Absolute: 0 10*3/uL (ref 0.0–0.7)
Eosinophils Relative: 0 % (ref 0–5)
HEMATOCRIT: 23.7 % — AB (ref 39.0–52.0)
Hemoglobin: 8.1 g/dL — ABNORMAL LOW (ref 13.0–17.0)
Lymphocytes Relative: 2 % — ABNORMAL LOW (ref 12–46)
Lymphs Abs: 0.1 10*3/uL — ABNORMAL LOW (ref 0.7–4.0)
MCH: 33.2 pg (ref 26.0–34.0)
MCHC: 34.2 g/dL (ref 30.0–36.0)
MCV: 97.1 fL (ref 78.0–100.0)
Monocytes Absolute: 0.1 10*3/uL (ref 0.1–1.0)
Monocytes Relative: 2 % — ABNORMAL LOW (ref 3–12)
NEUTROS ABS: 8.5 10*3/uL — AB (ref 1.7–7.7)
Neutrophils Relative %: 97 % — ABNORMAL HIGH (ref 43–77)
Platelets: 124 10*3/uL — ABNORMAL LOW (ref 150–400)
RBC: 2.44 MIL/uL — ABNORMAL LOW (ref 4.22–5.81)
RDW: 15.3 % (ref 11.5–15.5)
WBC: 8.8 10*3/uL (ref 4.0–10.5)

## 2014-01-28 LAB — COMPREHENSIVE METABOLIC PANEL
ALK PHOS: 156 U/L — AB (ref 39–117)
ALT: 26 U/L (ref 0–53)
AST: 25 U/L (ref 0–37)
Albumin: 2.3 g/dL — ABNORMAL LOW (ref 3.5–5.2)
BUN: 26 mg/dL — ABNORMAL HIGH (ref 6–23)
CHLORIDE: 101 meq/L (ref 96–112)
CO2: 17 meq/L — AB (ref 19–32)
Calcium: 8 mg/dL — ABNORMAL LOW (ref 8.4–10.5)
Creatinine, Ser: 1.26 mg/dL (ref 0.50–1.35)
GFR calc Af Amer: 65 mL/min — ABNORMAL LOW (ref >90)
GFR calc non Af Amer: 56 mL/min — ABNORMAL LOW (ref >90)
Glucose, Bld: 298 mg/dL — ABNORMAL HIGH (ref 70–99)
Potassium: 3.6 mEq/L — ABNORMAL LOW (ref 3.7–5.3)
SODIUM: 137 meq/L (ref 137–147)
Total Bilirubin: 0.4 mg/dL (ref 0.3–1.2)
Total Protein: 6.2 g/dL (ref 6.0–8.3)

## 2014-01-28 LAB — TROPONIN I
Troponin I: 0.66 ng/mL (ref <0.30)
Troponin I: 1.31 ng/mL (ref <0.30)
Troponin I: 1.55 ng/mL (ref <0.30)

## 2014-01-28 LAB — PREPARE RBC (CROSSMATCH)

## 2014-01-28 LAB — MRSA PCR SCREENING: MRSA BY PCR: NEGATIVE

## 2014-01-28 LAB — PROCALCITONIN: PROCALCITONIN: 3.68 ng/mL

## 2014-01-28 MED ORDER — DEXTROSE 5 % IV SOLN
1.0000 g | Freq: Two times a day (BID) | INTRAVENOUS | Status: DC
  Administered 2014-01-28 – 2014-01-29 (×3): 1 g via INTRAVENOUS
  Filled 2014-01-28 (×4): qty 1

## 2014-01-28 MED ORDER — ONDANSETRON HCL 4 MG PO TABS: 4.0000 mg | ORAL_TABLET | Freq: Four times a day (QID) | ORAL | Status: DC | PRN

## 2014-01-28 MED ORDER — ALPRAZOLAM 0.25 MG PO TABS
0.2500 mg | ORAL_TABLET | Freq: Three times a day (TID) | ORAL | Status: DC
  Administered 2014-01-28 – 2014-02-05 (×25): 0.25 mg via ORAL
  Filled 2014-01-28 (×26): qty 1

## 2014-01-28 MED ORDER — FUROSEMIDE 10 MG/ML IJ SOLN
INTRAMUSCULAR | Status: AC
  Filled 2014-01-28: qty 4

## 2014-01-28 MED ORDER — LEVALBUTEROL HCL 0.63 MG/3ML IN NEBU: 0.6300 mg | INHALATION_SOLUTION | Freq: Four times a day (QID) | RESPIRATORY_TRACT | Status: DC | PRN

## 2014-01-28 MED ORDER — VANCOMYCIN HCL IN DEXTROSE 750-5 MG/150ML-% IV SOLN
750.0000 mg | Freq: Two times a day (BID) | INTRAVENOUS | Status: DC
  Administered 2014-01-28 – 2014-01-31 (×7): 750 mg via INTRAVENOUS
  Filled 2014-01-28 (×9): qty 150

## 2014-01-28 MED ORDER — ACETAMINOPHEN 325 MG PO TABS
650.0000 mg | ORAL_TABLET | Freq: Four times a day (QID) | ORAL | Status: DC | PRN
  Administered 2014-02-03: 650 mg via ORAL
  Filled 2014-01-28 (×2): qty 2

## 2014-01-28 MED ORDER — CITALOPRAM HYDROBROMIDE 20 MG PO TABS
20.0000 mg | ORAL_TABLET | Freq: Every day | ORAL | Status: DC
  Administered 2014-01-28: 20 mg via ORAL
  Filled 2014-01-28 (×3): qty 1

## 2014-01-28 MED ORDER — IPRATROPIUM-ALBUTEROL 0.5-2.5 (3) MG/3ML IN SOLN: 3.0000 mL | RESPIRATORY_TRACT | Status: DC

## 2014-01-28 MED ORDER — BUDESONIDE 0.5 MG/2ML IN SUSP
0.5000 mg | Freq: Two times a day (BID) | RESPIRATORY_TRACT | Status: DC
  Administered 2014-01-28 – 2014-02-01 (×9): 0.5 mg via RESPIRATORY_TRACT
  Filled 2014-01-28 (×16): qty 2

## 2014-01-28 MED ORDER — TEMAZEPAM 15 MG PO CAPS
30.0000 mg | ORAL_CAPSULE | Freq: Every evening | ORAL | Status: DC | PRN
Start: 2014-01-28 — End: 2014-02-05
  Administered 2014-01-28 – 2014-02-04 (×8): 30 mg via ORAL
  Filled 2014-01-28 (×8): qty 2

## 2014-01-28 MED ORDER — FUROSEMIDE 10 MG/ML IJ SOLN
20.0000 mg | Freq: Once | INTRAMUSCULAR | Status: AC
  Administered 2014-01-28: 20 mg via INTRAVENOUS

## 2014-01-28 MED ORDER — ASPIRIN 81 MG PO CHEW: 324.0000 mg | CHEWABLE_TABLET | Freq: Once | ORAL | Status: DC

## 2014-01-28 MED ORDER — FOLIVANE-PLUS PO CAPS: 1.0000 | ORAL_CAPSULE | Freq: Every day | ORAL | Status: DC

## 2014-01-28 MED ORDER — PANTOPRAZOLE SODIUM 40 MG PO TBEC: 40.0000 mg | DELAYED_RELEASE_TABLET | Freq: Every day | ORAL | Status: DC

## 2014-01-28 MED ORDER — METHYLPREDNISOLONE SODIUM SUCC 40 MG IJ SOLR
40.0000 mg | Freq: Every day | INTRAMUSCULAR | Status: DC
  Administered 2014-01-28 – 2014-01-29 (×2): 40 mg via INTRAVENOUS
  Filled 2014-01-28 (×2): qty 1

## 2014-01-28 MED ORDER — IRBESARTAN 150 MG PO TABS
150.0000 mg | ORAL_TABLET | Freq: Every day | ORAL | Status: DC
  Administered 2014-01-28: 150 mg via ORAL
  Filled 2014-01-28 (×3): qty 1

## 2014-01-28 MED ORDER — IPRATROPIUM BROMIDE 0.02 % IN SOLN
0.5000 mg | RESPIRATORY_TRACT | Status: DC
  Administered 2014-01-28 – 2014-01-29 (×8): 0.5 mg via RESPIRATORY_TRACT
  Filled 2014-01-28 (×7): qty 2.5

## 2014-01-28 MED ORDER — SODIUM CHLORIDE 0.9 % IJ SOLN
3.0000 mL | Freq: Two times a day (BID) | INTRAMUSCULAR | Status: DC
  Administered 2014-01-28 – 2014-01-29 (×3): 3 mL via INTRAVENOUS
  Administered 2014-01-30: 22:00:00 via INTRAVENOUS
  Administered 2014-02-03 (×2): 3 mL via INTRAVENOUS

## 2014-01-28 MED ORDER — PANTOPRAZOLE SODIUM 40 MG PO TBEC
40.0000 mg | DELAYED_RELEASE_TABLET | Freq: Every day | ORAL | Status: DC
  Administered 2014-01-28: 40 mg via ORAL
  Filled 2014-01-28 (×2): qty 1

## 2014-01-28 MED ORDER — FE FUMARATE-B12-VIT C-FA-IFC PO CAPS
1.0000 | ORAL_CAPSULE | Freq: Every day | ORAL | Status: DC
  Administered 2014-01-28: 1 via ORAL
  Filled 2014-01-28 (×4): qty 1

## 2014-01-28 MED ORDER — SIMVASTATIN 40 MG PO TABS
40.0000 mg | ORAL_TABLET | ORAL | Status: DC
  Filled 2014-01-28 (×2): qty 1

## 2014-01-28 MED ORDER — LEVALBUTEROL HCL 0.63 MG/3ML IN NEBU
0.6300 mg | INHALATION_SOLUTION | RESPIRATORY_TRACT | Status: DC
  Filled 2014-01-28: qty 3

## 2014-01-28 MED ORDER — ONDANSETRON HCL 4 MG/2ML IJ SOLN: 4.0000 mg | Freq: Four times a day (QID) | INTRAMUSCULAR | Status: DC | PRN

## 2014-01-28 MED ORDER — ALPRAZOLAM 0.25 MG PO TABS: 0.2500 mg | ORAL_TABLET | ORAL | Status: DC | PRN

## 2014-01-28 MED ORDER — ACETAMINOPHEN 650 MG RE SUPP: 650.0000 mg | Freq: Four times a day (QID) | RECTAL | Status: DC | PRN

## 2014-01-28 MED ORDER — LEVALBUTEROL HCL 0.63 MG/3ML IN NEBU: 0.6300 mg | INHALATION_SOLUTION | RESPIRATORY_TRACT | Status: DC | PRN

## 2014-01-28 MED ORDER — ASPIRIN EC 325 MG PO TBEC
325.0000 mg | DELAYED_RELEASE_TABLET | Freq: Every day | ORAL | Status: DC
  Administered 2014-01-28: 325 mg via ORAL
  Filled 2014-01-28 (×3): qty 1

## 2014-01-28 MED ORDER — LEVALBUTEROL HCL 0.63 MG/3ML IN NEBU
0.6300 mg | INHALATION_SOLUTION | RESPIRATORY_TRACT | Status: DC
  Administered 2014-01-28 – 2014-01-29 (×8): 0.63 mg via RESPIRATORY_TRACT
  Filled 2014-01-28 (×12): qty 3

## 2014-01-28 MED ORDER — LEVALBUTEROL HCL 0.63 MG/3ML IN NEBU: 0.6300 mg | INHALATION_SOLUTION | RESPIRATORY_TRACT | Status: DC

## 2014-01-28 MED ORDER — SODIUM CHLORIDE 0.9 % IJ SOLN
3.0000 mL | Freq: Two times a day (BID) | INTRAMUSCULAR | Status: DC
  Administered 2014-01-28 – 2014-02-03 (×9): 3 mL via INTRAVENOUS

## 2014-01-28 MED ORDER — LEVALBUTEROL HCL 0.63 MG/3ML IN NEBU
0.6300 mg | INHALATION_SOLUTION | Freq: Four times a day (QID) | RESPIRATORY_TRACT | Status: DC
  Administered 2014-01-28 (×2): 0.63 mg via RESPIRATORY_TRACT
  Filled 2014-01-28 (×5): qty 3

## 2014-01-28 MED ORDER — PERFLUTREN LIPID MICROSPHERE
1.0000 mL | INTRAVENOUS | Status: AC | PRN
  Administered 2014-01-28: 2 mL via INTRAVENOUS
  Filled 2014-01-28: qty 10

## 2014-01-28 MED ORDER — PANTOPRAZOLE SODIUM 40 MG IV SOLR: 40.0000 mg | INTRAVENOUS | Status: DC

## 2014-01-28 MED ORDER — BUDESONIDE 0.25 MG/2ML IN SUSP
0.2500 mg | Freq: Two times a day (BID) | RESPIRATORY_TRACT | Status: DC
  Administered 2014-01-28: 0.25 mg via RESPIRATORY_TRACT
  Filled 2014-01-28 (×3): qty 2

## 2014-01-28 MED ORDER — IPRATROPIUM BROMIDE 0.02 % IN SOLN
0.5000 mg | Freq: Four times a day (QID) | RESPIRATORY_TRACT | Status: DC
  Administered 2014-01-28: 0.5 mg via RESPIRATORY_TRACT
  Filled 2014-01-28: qty 2.5

## 2014-01-28 MED ORDER — IPRATROPIUM BROMIDE 0.02 % IN SOLN
0.5000 mg | RESPIRATORY_TRACT | Status: DC
  Filled 2014-01-28: qty 2.5

## 2014-01-28 NOTE — Progress Notes (Addendum)
Subjective: Todd Villanueva has been having a rough month or so with continued pulmonary symptoms and cough with bronchospasm/wheezing and now has a compression fracture at T7 quite possibly from excessive coughing but also have to be concerned about a metastatic lesion. CT scan of the chest has multiple findings that could be inflammatory, infectious, or neoplastic. Inferior right upper lobe with infiltrate consistent with possible pneumonia. He also has been having some chest heaviness but attributes that to coughing. He has inferolateral Q waves on EKG in his inferolateral leads and a positive troponin but is currently chest pain-free. Dr. Tamela Gammon is his cardiologist and will be consulted.   Objective: Weight change:   Intake/Output Summary (Last 24 hours) at 01/28/14 0848 Last data filed at 01/28/14 0300  Gross per 24 hour  Intake 2408.82 ml  Output      0 ml  Net 2408.82 ml   Filed Vitals:   01/28/14 0300 01/28/14 0500 01/28/14 0549 01/28/14 0835  BP: 118/57     Pulse: 105     Temp: 97.5 F (36.4 C)   97.6 F (36.4 C)  TempSrc: Oral   Oral  Resp: 21     Height:      Weight:  174 lb 6.1 oz (79.1 kg)    SpO2: 99%  99%     General: Well-developed well-nourished, alert and conversive. Believed to be hospitalized Eyes: Anicteric no pallor.  ENT: Oropharynx is moist.  Neck: No mass felt.  Cardiovascular: S1-S2 heard.  Respiratory: Bilateral expiratory wheeze throughout, some rhonchi with cough Abdomen: Soft nontender bowel sounds present. No guarding or rigidity.  Skin: No rash.  Musculoskeletal: No edema.  Psychiatric: Appears normal.  Neurologic: Alert awake oriented to time place and person. Moves all extremities.  Lab Results: Results for orders placed during the hospital encounter of 01/27/14 (from the past 48 hour(s))  CBC     Status: Abnormal   Collection Time    01/27/14  5:40 PM      Result Value Ref Range   WBC 12.9 (*) 4.0 - 10.5 K/uL   RBC 3.01 (*) 4.22 -  5.81 MIL/uL   Hemoglobin 9.9 (*) 13.0 - 17.0 g/dL   HCT 29.1 (*) 39.0 - 52.0 %   MCV 96.7  78.0 - 100.0 fL   MCH 32.9  26.0 - 34.0 pg   MCHC 34.0  30.0 - 36.0 g/dL   RDW 15.0  11.5 - 15.5 %   Platelets 134 (*) 150 - 400 K/uL  PRO B NATRIURETIC PEPTIDE     Status: Abnormal   Collection Time    01/27/14  5:40 PM      Result Value Ref Range   Pro B Natriuretic peptide (BNP) 2835.0 (*) 0 - 125 pg/mL  PROTIME-INR     Status: None   Collection Time    01/27/14  5:40 PM      Result Value Ref Range   Prothrombin Time 12.6  11.6 - 15.2 seconds   INR 0.96  0.00 - 1.49  I-STAT TROPOININ, ED     Status: Abnormal   Collection Time    01/27/14  6:11 PM      Result Value Ref Range   Troponin i, poc 0.42 (*) 0.00 - 0.08 ng/mL   Comment NOTIFIED PHYSICIAN     Comment 3            Comment: Due to the release kinetics of cTnI,     a negative result within the  first hours     of the onset of symptoms does not rule out     myocardial infarction with certainty.     If myocardial infarction is still suspected,     repeat the test at appropriate intervals.  I-STAT CHEM 8, ED     Status: Abnormal   Collection Time    01/27/14  6:12 PM      Result Value Ref Range   Sodium 133 (*) 137 - 147 mEq/L   Potassium 3.8  3.7 - 5.3 mEq/L   Chloride 98  96 - 112 mEq/L   BUN 27 (*) 6 - 23 mg/dL   Creatinine, Ser 1.30  0.50 - 1.35 mg/dL   Glucose, Bld 104 (*) 70 - 99 mg/dL   Calcium, Ion 1.14  1.13 - 1.30 mmol/L   TCO2 26  0 - 100 mmol/L   Hemoglobin 9.5 (*) 13.0 - 17.0 g/dL   HCT 28.0 (*) 39.0 - 52.0 %  I-STAT TROPOININ, ED     Status: Abnormal   Collection Time    01/27/14  7:40 PM      Result Value Ref Range   Troponin i, poc 0.45 (*) 0.00 - 0.08 ng/mL   Comment NOTIFIED PHYSICIAN     Comment 3            Comment: Due to the release kinetics of cTnI,     a negative result within the first hours     of the onset of symptoms does not rule out     myocardial infarction with certainty.     If  myocardial infarction is still suspected,     repeat the test at appropriate intervals.  I-STAT CG4 LACTIC ACID, ED     Status: None   Collection Time    01/27/14  7:43 PM      Result Value Ref Range   Lactic Acid, Venous 1.79  0.5 - 2.2 mmol/L  TROPONIN I     Status: Abnormal   Collection Time    01/27/14  8:35 PM      Result Value Ref Range   Troponin I 0.55 (*) <0.30 ng/mL   Comment:            Due to the release kinetics of cTnI,     a negative result within the first hours     of the onset of symptoms does not rule out     myocardial infarction with certainty.     If myocardial infarction is still suspected,     repeat the test at appropriate intervals.     CRITICAL RESULT CALLED TO, READ BACK BY AND VERIFIED WITH:     HENSON H,RN 01/27/14 2143 WAYK  URINALYSIS, ROUTINE W REFLEX MICROSCOPIC     Status: Abnormal   Collection Time    01/27/14 10:04 PM      Result Value Ref Range   Color, Urine YELLOW  YELLOW   APPearance CLEAR  CLEAR   Specific Gravity, Urine >1.046 (*) 1.005 - 1.030   pH 5.5  5.0 - 8.0   Glucose, UA NEGATIVE  NEGATIVE mg/dL   Hgb urine dipstick NEGATIVE  NEGATIVE   Bilirubin Urine NEGATIVE  NEGATIVE   Ketones, ur 15 (*) NEGATIVE mg/dL   Protein, ur 100 (*) NEGATIVE mg/dL   Urobilinogen, UA 1.0  0.0 - 1.0 mg/dL   Nitrite NEGATIVE  NEGATIVE   Leukocytes, UA NEGATIVE  NEGATIVE  URINE MICROSCOPIC-ADD ON     Status:  Abnormal   Collection Time    01/27/14 10:04 PM      Result Value Ref Range   WBC, UA 0-2  <3 WBC/hpf   Casts GRANULAR CAST (*) NEGATIVE  I-STAT TROPOININ, ED     Status: Abnormal   Collection Time    01/27/14 11:43 PM      Result Value Ref Range   Troponin i, poc 0.46 (*) 0.00 - 0.08 ng/mL   Comment NOTIFIED PHYSICIAN     Comment 3            Comment: Due to the release kinetics of cTnI,     a negative result within the first hours     of the onset of symptoms does not rule out     myocardial infarction with certainty.     If  myocardial infarction is still suspected,     repeat the test at appropriate intervals.  MRSA PCR SCREENING     Status: None   Collection Time    01/28/14  1:24 AM      Result Value Ref Range   MRSA by PCR NEGATIVE  NEGATIVE   Comment:            The GeneXpert MRSA Assay (FDA     approved for NASAL specimens     only), is one component of a     comprehensive MRSA colonization     surveillance program. It is not     intended to diagnose MRSA     infection nor to guide or     monitor treatment for     MRSA infections.  HEPARIN LEVEL (UNFRACTIONATED)     Status: None   Collection Time    01/28/14  3:02 AM      Result Value Ref Range   Heparin Unfractionated 0.48  0.30 - 0.70 IU/mL   Comment:            IF HEPARIN RESULTS ARE BELOW     EXPECTED VALUES, AND PATIENT     DOSAGE HAS BEEN CONFIRMED,     SUGGEST FOLLOW UP TESTING     OF ANTITHROMBIN III LEVELS.  CBC     Status: Abnormal   Collection Time    01/28/14  3:02 AM      Result Value Ref Range   WBC 10.5  4.0 - 10.5 K/uL   RBC 2.57 (*) 4.22 - 5.81 MIL/uL   Hemoglobin 8.5 (*) 13.0 - 17.0 g/dL   HCT 25.1 (*) 39.0 - 52.0 %   MCV 97.7  78.0 - 100.0 fL   MCH 33.1  26.0 - 34.0 pg   MCHC 33.9  30.0 - 36.0 g/dL   RDW 15.4  11.5 - 15.5 %   Platelets 121 (*) 150 - 400 K/uL  TYPE AND SCREEN     Status: None   Collection Time    01/28/14  5:10 AM      Result Value Ref Range   ABO/RH(D) O POS     Antibody Screen NEG     Sample Expiration 01/31/2014     Unit Number [id number]     Blood Component Type RED CELLS,LR     Unit division 00     Status of Unit ISSUED     Transfusion Status OK TO TRANSFUSE     Crossmatch Result Compatible     Unit Number [id number]     Blood Component Type RBC LR PHER2     Unit division 00  Status of Unit ALLOCATED     Transfusion Status OK TO TRANSFUSE     Crossmatch Result Compatible    TROPONIN I     Status: Abnormal   Collection Time    01/28/14  5:10 AM      Result Value Ref Range    Troponin I 0.66 (*) <0.30 ng/mL   Comment:            Due to the release kinetics of cTnI,     a negative result within the first hours     of the onset of symptoms does not rule out     myocardial infarction with certainty.     If myocardial infarction is still suspected,     repeat the test at appropriate intervals.     CRITICAL VALUE NOTED.  VALUE IS CONSISTENT WITH PREVIOUSLY REPORTED AND CALLED VALUE.  COMPREHENSIVE METABOLIC PANEL     Status: Abnormal   Collection Time    01/28/14  5:10 AM      Result Value Ref Range   Sodium 137  137 - 147 mEq/L   Potassium 3.6 (*) 3.7 - 5.3 mEq/L   Chloride 101  96 - 112 mEq/L   CO2 17 (*) 19 - 32 mEq/L   Glucose, Bld 298 (*) 70 - 99 mg/dL   BUN 26 (*) 6 - 23 mg/dL   Creatinine, Ser 1.26  0.50 - 1.35 mg/dL   Calcium 8.0 (*) 8.4 - 10.5 mg/dL   Total Protein 6.2  6.0 - 8.3 g/dL   Albumin 2.3 (*) 3.5 - 5.2 g/dL   AST 25  0 - 37 U/L   ALT 26  0 - 53 U/L   Alkaline Phosphatase 156 (*) 39 - 117 U/L   Total Bilirubin 0.4  0.3 - 1.2 mg/dL   GFR calc non Af Amer 56 (*) >90 mL/min   GFR calc Af Amer 65 (*) >90 mL/min   Comment: (NOTE)     The eGFR has been calculated using the CKD EPI equation.     This calculation has not been validated in all clinical situations.     eGFR's persistently <90 mL/min signify possible Chronic Kidney     Disease.  CBC WITH DIFFERENTIAL     Status: Abnormal   Collection Time    01/28/14  5:10 AM      Result Value Ref Range   WBC 8.8  4.0 - 10.5 K/uL   RBC 2.44 (*) 4.22 - 5.81 MIL/uL   Hemoglobin 8.1 (*) 13.0 - 17.0 g/dL   HCT 23.7 (*) 39.0 - 52.0 %   MCV 97.1  78.0 - 100.0 fL   MCH 33.2  26.0 - 34.0 pg   MCHC 34.2  30.0 - 36.0 g/dL   RDW 15.3  11.5 - 15.5 %   Platelets 124 (*) 150 - 400 K/uL   Neutrophils Relative % 97 (*) 43 - 77 %   Neutro Abs 8.5 (*) 1.7 - 7.7 K/uL   Lymphocytes Relative 2 (*) 12 - 46 %   Lymphs Abs 0.1 (*) 0.7 - 4.0 K/uL   Monocytes Relative 2 (*) 3 - 12 %   Monocytes Absolute 0.1   0.1 - 1.0 K/uL   Eosinophils Relative 0  0 - 5 %   Eosinophils Absolute 0.0  0.0 - 0.7 K/uL   Basophils Relative 0  0 - 1 %   Basophils Absolute 0.0  0.0 - 0.1 K/uL  PREPARE RBC (CROSSMATCH)  Status: None   Collection Time    01/28/14  5:30 AM      Result Value Ref Range   Order Confirmation ORDER PROCESSED BY BLOOD BANK      Studies/Results: Ct Angio Chest Pe W/cm &/or Wo Cm  01/27/2014   CLINICAL DATA:  Lung cancer, prior right middle and lower lobectomies. Shortness of breath. Cough. Chest pain.  EXAM: CT ANGIOGRAPHY CHEST WITH CONTRAST  TECHNIQUE: Multidetector CT imaging of the chest was performed using the standard protocol during bolus administration of intravenous contrast. Multiplanar CT image reconstructions and MIPs were obtained to evaluate the vascular anatomy.  CONTRAST:  152m OMNIPAQUE IOHEXOL 350 MG/ML SOLN  COMPARISON:  DG CHEST 1V PORT dated 01/27/2014; CT CHEST W/CM dated 08/12/2013; CT CHEST W/CM dated 02/11/2013; DG CHEST 2 VIEW dated 12/16/2013  FINDINGS: The patient received 2 boluses of contrast. The first was a 50 cc bolus of Omnipaque 350 which triggered the pressure limit. Repeated exam with 80 cc of contrast performed with much better opacification of the vascular structures. Similar truncated appearance of a right pulmonary artery due to the lobectomies. No visualized filling defects in the remaining right pulmonary arterial tree.  A mixed density appearance in the left lower lobe pulmonary arterial structures appears to correlate directly with readily observable motion artifact on the lung window images, and does not have a characteristic morphology for pulmonary embolus. Accordingly, there is abnormal hypodensity is attributed to artifact for example on image 165 of series 16.  Right upper paratracheal lymph node, 1.5 cm in short axis on image 18 of series 15 (formerly 1.1 cm). Left prevascular node 0.9 cm in short axis (formerly 0.7 cm).  Coronary atherosclerosis. Thick  pleural enhancement on the right side with associated pleural fluid. Increase in interstitial and airspace opacity in the base of the right lung as on image 56 of series 17. Multiple right rib fractures or osteotomies, similar to prior. Prominent emphysema noted. 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  There is a new 90% compression fracture at T7 with 3 mm of posterior retropulsion contributing to central stenosis. . Benign compression fracture favored over pathologic fracture particularly in light of the relatively normal appearance of this vertebra on 08/12/2013.  Review of the MIP images confirms the above findings.  IMPRESSION: 1. No embolus identified. 2. Enlarging right upper paratracheal lymph node, 1.5 cm in short axis, possibly reactive or malignant. 3. Increasing airspace opacity at the inferior margin of the remaining right upper lobe, nonspecific but potentially from radiation pneumonitis, pneumonia, or spread of tumor. 4. Acute 90% compression fracture of the T7 vertebra with 3 mm of posterior retropulsion contributing to central stenosis. It is 5. Coronary atherosclerosis. 6. Thick pleural enhancement on the right side with right pleural effusion, similar to prior. 7. Multiple right rib fractures are osteotomies, similar to prior. 8. Prominent emphysema. 9. Airway thickening along the right hilum with occlusion or near occlusion of the right tracheobronchial tree in this vicinity.   Electronically Signed   By: WSherryl BartersM.D.   On: 01/27/2014 21:10   Dg Chest Portable 1 View  01/27/2014   CLINICAL DATA:  Shortness breath.  History of lung cancer.  EXAM: PORTABLE CHEST - 1 VIEW  COMPARISON:  12/16/2013.  FINDINGS: Stable post therapy changes right lung including multiple remote right-sided rib fractures (some which are not completely healed). Mediastinal shift to the right. Limited for evaluating  for recurrent  tumor or detecting underlying infiltrate/ atelectasis/ pleural effusion given the postoperative changes.  Calcified mildly tortuous aorta.  Mild pulmonary vascular prominence without frank pulmonary edema.  IMPRESSION: Stable post therapy exam as noted above.   Electronically Signed   By: Chauncey Cruel M.D.   On: 01/27/2014 18:31   Medications: Scheduled Meds: . sodium chloride   Intravenous STAT  . aspirin EC  325 mg Oral Daily  . budesonide (PULMICORT) nebulizer solution  0.25 mg Nebulization BID  . ceFEPime (MAXIPIME) IV  1 g Intravenous Q12H  . citalopram  20 mg Oral Daily  . ferrous [id number] C-folic acid  1 capsule Oral Daily  . furosemide  20 mg Intravenous Once  . ipratropium  0.5 mg Nebulization Q6H  . irbesartan  150 mg Oral Daily  . levalbuterol  0.63 mg Nebulization Q6H  . methylPREDNISolone (SOLU-MEDROL) injection  40 mg Intravenous Daily  . pantoprazole  40 mg Oral Daily  . [START ON 01/29/2014] simvastatin  40 mg Oral Q M,W,F  . sodium chloride  3 mL Intravenous Q12H  . sodium chloride  3 mL Intravenous Q12H  . vancomycin  750 mg Intravenous Q12H   Continuous Infusions: . heparin 1,300 Units/hr (01/28/14 0300)   PRN Meds:.acetaminophen, acetaminophen, ALPRAZolam, levalbuterol, ondansetron (ZOFRAN) IV, ondansetron, temazepam  Assessment/Plan: Principal Problem:   Acute respiratory failure - likely multifactorial. Very confusing picture with lots of etiologies possible for current pulmonary compromise. Concerned about infection, post radiation inflammation, and also recurrent lung cancer. Have consulted pulmonary critical care and will consult Dr. Earlie Server of oncology who is cared for his lung carcinoma in the past. 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 Earlie Server has been  contacted for consultation   OSA (obstructive sleep apnea) - does not tolerate CPAP   HCAP (healthcare-associated pneumonia) - possible pneumonia. Continue Maxipime   T7 vertebral fracture - could be from metastatic cause or from severe coughing   Non-ST elevation MI (NSTEMI) - 3 positive troponins. Contacted Dr. Tamela Gammon who will arrange consultation. Continue IV heparin.   Anemia - keep hemoglobin above 9 secondary to possible coronary ischemia. Denies blood in stools. Could be related to bone marrow process. Dr. Earlie Server to consult. Will Hemoccult stools   Hypertension - controlled   Anxiety - Xanax 0.25 mg 3 times a day and every 6 hours when necessary   Hyperlipidemia - continue statin therapy    90 minutes critical care time   LOS: 1 day   [name], MD 01/28/2014, 8:48 AM

## 2014-01-28 NOTE — Progress Notes (Signed)
[place]  [phone]    HOSPITAL PROGRESS NOTE  DIAGNOSIS: Stage IIB/IIIA non-small cell lung cancer, squamous cell carcinoma diagnosed in February of 2012  PRIOR THERAPY:  1) Status post a course of concurrent chemoradiation with weekly carboplatin and paclitaxel. Last dose of chemotherapy was given on 03/21/2011.  2) status post right VATS, right thoracotomy, right middle and right lower lobectomies under the care of [name] on 06/10/2011.   CURRENT THERAPY:observation  HPI: 73 year old white male with a history of lung cancer as above.He had a recent hospitalization for COPD exacerbation. He presented on 2/23 with 2 day history of  persistent cough, complicated with wheezing and tachycardia. CT angio of the chest  Was negative for  Pulmonary Embolus.  Enlarging right upper paratracheal lymph node, 1.5 cm, right upper lobe, nonspecific- radiation pneumonitis, pneumonia, or spread of tumor,  90% compression fracture of the T7 vertebra with 3 mm of posterior retropulsion contributing to central stenosis, Thick pleural enhancement on the right side with right pleural effusion, similar to prior Multiple known right rib fractures and  Airway thickening along the right hilum with occlusion or near occlusion of the right tracheobronchial tree in this vicinity.  He was started on empiric IV antibiotics for HCAP and nebs and steroids for respiratory symptoms.Labs showed increased troponin with EKG showing new Q waves in the inferolateral leads. Cardiology evaluated the patient, recommending IV Heparin,and cardiac markers. 2 D echo is pending. We were kindly informed of the patient's admission.  MEDICATIONS: Scheduled Meds: . sodium chloride   Intravenous STAT  . ALPRAZolam  0.25 mg Oral TID  . aspirin EC  325 mg Oral Daily  . budesonide (PULMICORT) nebulizer solution  0.5 mg Nebulization BID  . ceFEPime (MAXIPIME) IV  1 g Intravenous Q12H  . citalopram  20 mg  Oral Daily  . ferrous [id number] C-folic acid  1 capsule Oral Daily  . ipratropium  0.5 mg Nebulization Q6H  . irbesartan  150 mg Oral Daily  . levalbuterol  0.63 mg Nebulization Q6H  . methylPREDNISolone (SOLU-MEDROL) injection  40 mg Intravenous Daily  . pantoprazole  40 mg Oral Daily  . [START ON 01/29/2014] simvastatin  40 mg Oral Q M,W,F  . sodium chloride  3 mL Intravenous Q12H  . sodium chloride  3 mL Intravenous Q12H  . vancomycin  750 mg Intravenous Q12H   Continuous Infusions: . heparin 1,300 Units/hr (01/28/14 0300)   PRN Meds:.acetaminophen, acetaminophen, levalbuterol, ondansetron (ZOFRAN) IV, ondansetron, temazepam ALLERGIES:   Allergies  Allergen Reactions  . Hydrocodone-Acetaminophen     REACTION: Itch  . Oxycodone Itching  . Tramadol     Pt states will not take med b/c it does not work   . Zolpidem Tartrate Other (See Comments)    nightmares     PHYSICAL EXAMINATION:   Filed Vitals:   01/28/14 1145  BP:   Pulse: 99  Temp: 97.5 F (36.4 C)  Resp: 22   Filed Weights   01/27/14 1730 01/28/14 0124 01/28/14 0500  Weight: 187 lb (84.823 kg) 174 lb 6.1 oz (79.1 kg) 174 lb 6.1 oz (79.50 kg)    73 year old white male in no acute distress A. and O. x3 General well-developed and well-nourished  HEENT: Normocephalic, atraumatic, PERRLA. Oral cavity without thrush or lesions. Neck supple. no thyromegaly, no cervical or supraclavicular adenopathy  Lungs left>right t expiratory  Wheezing,present diffuse rhonchi, no rales. Cardiac: regular rate and rhythm,no murmur , rubs or gallops  Abdomen soft nontender , bowel sounds x4. No HSM Extremities no clubbing cyanosis or edema. No bruising or petechial rash Neuro: non focal  LABORATORY/RADIOLOGY DATA:   Recent Labs Lab 01/27/14 1740 01/27/14 1812 01/28/14 0302 01/28/14 0510  WBC 12.9*  --  10.5 8.8  HGB 9.9* 9.5* 8.5* 8.1*  HCT 29.1* 28.0* 25.1* 23.7*  PLT 134*  --  121* 124*  MCV 96.7  --  97.7  97.1  MCH 32.9  --  33.1 33.2  MCHC 34.0  --  33.9 34.2  RDW 15.0  --  15.4 15.3  LYMPHSABS  --   --   --  0.1*  MONOABS  --   --   --  0.1  EOSABS  --   --   --  0.0  BASOSABS  --   --   --  0.0    CMP    Recent Labs Lab 01/27/14 1812 01/28/14 0510  NA 133* 137  K 3.8 3.6*  CL 98 101  CO2  --  17*  GLUCOSE 104* 298*  BUN 27* 26*  CREATININE 1.30 1.26  CALCIUM  --  8.0*  AST  --  25  ALT  --  26  ALKPHOS  --  156*  BILITOT  --  0.4        Component Value Date/Time   BILITOT 0.4 01/28/2014 0510   BILITOT 0.72 08/09/2013 0844   BILITOT 0.60 01/10/2012 1205      Recent Labs Lab 01/27/14 1740  INR 0.96      Urinalysis    Component Value Date/Time   COLORURINE YELLOW 01/27/2014 2204   APPEARANCEUR CLEAR 01/27/2014 2204   LABSPEC >1.046* 01/27/2014 2204   PHURINE 5.5 01/27/2014 2204   GLUCOSEU NEGATIVE 01/27/2014 2204   HGBUR NEGATIVE 01/27/2014 2204   BILIRUBINUR NEGATIVE 01/27/2014 2204   KETONESUR 15* 01/27/2014 2204   PROTEINUR 100* 01/27/2014 2204   UROBILINOGEN 1.0 01/27/2014 2204   NITRITE NEGATIVE 01/27/2014 2204   LEUKOCYTESUR NEGATIVE 01/27/2014 2204      Liver Function Tests:  Recent Labs Lab 01/28/14 0510  AST 25  ALT 26  ALKPHOS 156*  BILITOT 0.4  PROT 6.2  ALBUMIN 2.3*   Cardiac Enzymes:  Recent Labs Lab 01/27/14 2035 01/28/14 0510 01/28/14 0950  TROPONINI 0.55* 0.66* 1.31*     Radiology Studies:  Ct Angio Chest Pe W/cm &/or Wo Cm  01/27/2014   CLINICAL DATA:  Lung cancer, prior right middle and lower lobectomies. Shortness of breath. Cough. Chest pain.  EXAM: CT ANGIOGRAPHY CHEST WITH CONTRAST  TECHNIQUE: Multidetector CT imaging of the chest was performed using the standard protocol during bolus administration of intravenous contrast. Multiplanar CT image reconstructions and MIPs were obtained to evaluate the vascular anatomy.  CONTRAST:  171mL OMNIPAQUE IOHEXOL 350 MG/ML SOLN  COMPARISON:  DG CHEST 1V PORT dated 01/27/2014; CT  CHEST W/CM dated 08/12/2013; CT CHEST W/CM dated 02/11/2013; DG CHEST 2 VIEW dated 12/16/2013  FINDINGS: The patient received 2 boluses of contrast. The first was a 50 cc bolus of Omnipaque 350 which triggered the pressure limit. Repeated exam with 80 cc of contrast performed with much better opacification of the vascular structures. Similar truncated appearance of a right pulmonary artery due to the lobectomies. No visualized filling defects in the remaining right pulmonary arterial tree.  A mixed density appearance in the left lower lobe pulmonary arterial structures appears to correlate directly with readily observable motion artifact on the lung window images, and  does not have a characteristic morphology for pulmonary embolus. Accordingly, there is abnormal hypodensity is attributed to artifact for example on image 165 of series 16.  Right upper paratracheal lymph node, 1.5 cm in short axis on image 18 of series 15 (formerly 1.1 cm). Left prevascular node 0.9 cm in short axis (formerly 0.7 cm).  Coronary atherosclerosis. Thick pleural enhancement on the right side with associated pleural fluid. Increase in interstitial and airspace opacity in the base of the right lung as on image 56 of series 17. Multiple right rib fractures or osteotomies, similar to prior. Prominent emphysema noted. 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  There is a new 90% compression fracture at T7 with 3 mm of posterior retropulsion contributing to central stenosis. . Benign compression fracture favored over pathologic fracture particularly in light of the relatively normal appearance of this vertebra on 08/12/2013.  Review of the MIP images confirms the above findings.  IMPRESSION: 1. No embolus identified. 2. Enlarging right upper paratracheal lymph node, 1.5 cm in short axis, possibly reactive or malignant. 3. Increasing airspace opacity at the inferior  margin of the remaining right upper lobe, nonspecific but potentially from radiation pneumonitis, pneumonia, or spread of tumor. 4. Acute 90% compression fracture of the T7 vertebra with 3 mm of posterior retropulsion contributing to central stenosis. It is 5. Coronary atherosclerosis. 6. Thick pleural enhancement on the right side with right pleural effusion, similar to prior. 7. Multiple right rib fractures are osteotomies, similar to prior. 8. Prominent emphysema. 9. Airway thickening along the right hilum with occlusion or near occlusion of the right tracheobronchial tree in this vicinity.   Electronically Signed   By: Sherryl Barters M.D.   On: 01/27/2014 21:10   Dg Chest Portable 1 View  01/27/2014   CLINICAL DATA:  Shortness breath.  History of lung cancer.  EXAM: PORTABLE CHEST - 1 VIEW  COMPARISON:  12/16/2013.  FINDINGS: Stable post therapy changes right lung including multiple remote right-sided rib fractures (some which are not completely healed). Mediastinal shift to the right. Limited for evaluating for recurrent tumor or detecting underlying infiltrate/ atelectasis/ pleural effusion given the postoperative changes.  Calcified mildly tortuous aorta.  Mild pulmonary vascular prominence without frank pulmonary edema.  IMPRESSION: Stable post therapy exam as noted above.   Electronically Signed   By: Chauncey Cruel M.D.   On: 01/27/2014 18:31       ASSESSMENT AND PLAN:      73 year old man with :        1  Stage IIB/IIIA non-small cell lung cancer, squamous cell carcinoma diagnosed in February of 2012.   Status post a course of concurrent chemoradiation               with  weekly carboplatin and paclitaxel. Last dose of chemotherapy was given on 03/21/2011. Status post right VATS, right thoracotomy, right middle and          right lower lobectomies, on observation.  2.Acute respiratory failure probably secondary to pneumonia and COPD - patient has been placed on vancomycin and cefepime for  health care associated pneumonia given that patient was recently admitted to the hospital. On  nebulizers IV steroids and Pulmicort for COPD exacerbation as per Pulmonary Service On IV Cafepime and Vancomycin. Sputum cultures pending.  3.Non-ST elevation MI on heparin.  As per Cards.  4.T7 vertebral fractureMRI to be done once patient  is stable.  5.Anticoagulation: on Heparin per Pharmacy  Other medical issues as per admitting team.   Rondel Jumbo, PA-C 01/28/2014, 11:49 AM  ADDENDUM: Hematology/Oncology Attending: The patient is seen and examined today. I agree with the above note. He is a very pleasant 73 years old white male with history of stage IIIa non-small cell lung cancer status post a course of concurrent chemoradiation with weekly carboplatin and paclitaxel followed by right middle and lower lobectomies in July of 2012. He has been observation since that time was no significant evidence for disease progression. The patient was admitted to [facility] complaining of worsening dyspnea and CT angiogram scan of the chest performed during his admission showed no evidence for pulmonary embolism but there is a slightly enlarging right upper lobe paratracheal lymph node which is most likely reactive in nature secondary to his recent diagnosis was pneumonia. The patient is currently undergoing treatment for his pneumonia with Maxipime and vancomycin. He is feeling a little bit better. I have a lengthy discussion with the patient and his wife today about his recent scan results. I recommended for the patient to continue his current treatment for pneumonia and COPD. I will arrange for the patient a followup appointment with me in around 6 weeks from now with repeat CT scan of the chest to confirm improvement of the mediastinal lymphadenopathy. If these mediastinal lymphadenopathy continues to increase in size of the upcoming scan, I would consider the patient for a PET scan and further  evaluation to rule out disease recurrence. The patient and his wife agreed to the current plan. Thank you so much for taking good care of Mr. Viereck. Please call if you have any questions.

## 2014-01-28 NOTE — H&P (Signed)
[facility] History and Physical  [name]:[id number] DOB: [birth year] DOA: 01/27/2014  Referring physician: ER physician. PCP: [name], MD  Specialists: Dr. Julien Nordmann. Oncologist.  Chief Complaint: Shortness of breath and cough.  HPI: Todd Villanueva is a 73 y.o. male history of COPD, non-small cell lung cancer, hypertension who was recently admitted last month for COPD exacerbation has been experiencing persistent cough over the last 2 days which has been corrective. Denies any chest pain but has been having over the last one month a sensation of heaviness over his rib cage. Patient denies any nausea vomiting or abdominal pain. In the ER patient was found to be wheezing and tachycardic. Patient had a CT angiogram of the chest which showed increasing x-rays opacity at the inferior margin of the right upper lobe concerning for possible pneumonia. Patient has been empirically started on antibiotics for health care associated pneumonia and was placed on nebulizer and steroids for COPD exacerbation. Labs showed increased troponin with EKG showing new Q waves in the inferolateral leads. On-call cardiologist Dr. Aundra Dubin was consulted by the ER physician and at this time Dr. Aundra Dubin has advised patient to be placed on IV heparin and cycle cardiac markers. Patient presently chest pain-free. CT angiogram of the chest in addition also showed T7 compression fracture. Patient states over the last one month he has had some heaviness in the lower part of his chest like a binding pain. Has some pain on palpation of the spinous process. Denies any incontinence of urine or bowel or any focal deficits.  Review of Systems: As presented in the history of presenting illness, rest negative.  Past Medical History  Diagnosis Date  . Hypertension   . lung ca dx'd 02/2011    xrt/chemo comp 03/30/11, stage IIIA  . Hyperlipidemia   . 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 Right vats, rt thoracotomy, rt middle and rt lower  lobectomy  06/10/2011    Dr Arlyce Dice  . Knee arthroscopy    . Tonsillectomy    . Coronary stent placement  JULY,AUGUST 2011  . Orif femoral neck fracture w/ dhs     Social History:  reports that he quit smoking about 3 months ago. His smoking use included Cigarettes. He has a 26.5 pack-year smoking history. He has never used smokeless tobacco. He reports that he drinks alcohol. He reports that he does not use illicit drugs. Where does patient live cough. Can patient participate in ADLs? Yes.  Allergies  Allergen Reactions  . Hydrocodone-Acetaminophen     REACTION: Itch  . Oxycodone Itching  . Tramadol     Pt states will not take med b/c it does not work   . Zolpidem Tartrate Other (See Comments)    nightmares    Family History:  Family History  Problem Relation Age of Onset  . Cancer Father     multiple myeloma  . COPD Sister     emphysema- smoked  . Cancer Brother     multiple myeloma      Prior to Admission medications   Medication Sig Start Date End Date Taking? Authorizing Provider  ALPRAZolam Duanne Moron) 0.25 MG tablet Take 0.25 mg by mouth as needed for anxiety (prior to opthalmic injections.).   Yes Historical Provider, MD  azithromycin (ZITHROMAX) 250 MG tablet Take 250 mg by mouth daily.  Yes Historical Provider, MD  bevacizumab (AVASTIN) 1.25 mg/0.1 mL SOLN 1.25 mg by Intravitreal route every 30 (thirty) days.   Yes Hayden Pedro, MD  citalopram (CELEXA) 20 MG tablet Take 20 mg by mouth daily.     Yes Historical Provider, MD  FeFum-FePoly-FA-B Cmp-C-Biot (FOLIVANE-PLUS) CAPS Take 1 capsule by mouth daily. 09/06/13  Yes [name], MD  furosemide (LASIX) 20 MG tablet One daily as needed for leg sweeling 01/07/14  Yes Tanda Rockers, MD  levalbuterol Penne Lash) 0.63 MG/3ML nebulizer solution Take 0.63 mg by  nebulization 3 (three) times daily as needed for wheezing or shortness of breath.   Yes Historical Provider, MD  olmesartan (BENICAR) 20 MG tablet Take 20 mg by mouth daily.   Yes Historical Provider, MD  omeprazole (PRILOSEC) 20 MG capsule Take 20 mg by mouth daily.     Yes Historical Provider, MD  simvastatin (ZOCOR) 40 MG tablet Take 40 mg by mouth every Monday, Wednesday, and Friday.    Yes Historical Provider, MD  temazepam (RESTORIL) 30 MG capsule Take 1 capsule (30 mg total) by mouth at bedtime as needed for sleep. 12/20/13  Yes Josetta Huddle, MD    Physical Exam: Filed Vitals:   01/28/14 0006 01/28/14 0015 01/28/14 0030 01/28/14 0045  BP:  105/53 111/58 100/58  Pulse:  98 106 108  Temp:      TempSrc:      Resp:  23 20 21   Height:      Weight:      SpO2: 100% 100% 100% 100%     General:  Well-developed well-nourished.  Eyes: Anicteric no pallor.  ENT: No discharge from the ears eyes nose mouth.  Neck: No mass felt.  Cardiovascular: S1-S2 heard.  Respiratory: Bilateral expiratory wheeze no crepitations.  Abdomen: Soft nontender bowel sounds present. No guarding or rigidity.  Skin: No rash.  Musculoskeletal: No edema.  Psychiatric: Appears normal.  Neurologic: Alert awake oriented to time place and person. Moves all extremities.  Labs on Admission:  Basic Metabolic Panel:  Recent Labs Lab 01/27/14 1812  NA 133*  K 3.8  CL 98  GLUCOSE 104*  BUN 27*  CREATININE 1.30   Liver Function Tests: No results found for this basename: AST, ALT, ALKPHOS, BILITOT, PROT, ALBUMIN,  in the last 168 hours No results found for this basename: LIPASE, AMYLASE,  in the last 168 hours No results found for this basename: AMMONIA,  in the last 168 hours CBC:  Recent Labs Lab 01/27/14 1740 01/27/14 1812  WBC 12.9*  --   HGB 9.9* 9.5*  HCT 29.1* 28.0*  MCV 96.7  --   PLT 134*  --    Cardiac Enzymes:  Recent Labs Lab 01/27/14 2035  TROPONINI 0.55*    BNP (last 3  results)  Recent Labs  01/27/14 1740  PROBNP 2835.0*   CBG: No results found for this basename: GLUCAP,  in the last 168 hours  Radiological Exams on Admission: Ct Angio Chest Pe W/cm &/or Wo Cm  01/27/2014   CLINICAL DATA:  Lung cancer, prior right middle and lower lobectomies. Shortness of breath. Cough. Chest pain.  EXAM: CT ANGIOGRAPHY CHEST WITH CONTRAST  TECHNIQUE: Multidetector CT imaging of the chest was performed using the standard protocol during bolus administration of intravenous contrast. Multiplanar CT image reconstructions and MIPs were obtained to evaluate the vascular anatomy.  CONTRAST:  123m OMNIPAQUE IOHEXOL 350 MG/ML SOLN  COMPARISON:  DG CHEST 1V PORT dated 01/27/2014; CT CHEST W/CM  dated 08/12/2013; CT CHEST W/CM dated 02/11/2013; DG CHEST 2 VIEW dated 12/16/2013  FINDINGS: The patient received 2 boluses of contrast. The first was a 50 cc bolus of Omnipaque 350 which triggered the pressure limit. Repeated exam with 80 cc of contrast performed with much better opacification of the vascular structures. Similar truncated appearance of a right pulmonary artery due to the lobectomies. No visualized filling defects in the remaining right pulmonary arterial tree.  A mixed density appearance in the left lower lobe pulmonary arterial structures appears to correlate directly with readily observable motion artifact on the lung window images, and does not have a characteristic morphology for pulmonary embolus. Accordingly, there is abnormal hypodensity is attributed to artifact for example on image 165 of series 16.  Right upper paratracheal lymph node, 1.5 cm in short axis on image 18 of series 15 (formerly 1.1 cm). Left prevascular node 0.9 cm in short axis (formerly 0.7 cm).  Coronary atherosclerosis. Thick pleural enhancement on the right side with associated pleural fluid. Increase in interstitial and airspace opacity in the base of the right lung as on image 56 of series 17. Multiple right  rib fractures or osteotomies, similar to prior. Prominent emphysema noted. 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  There is a new 90% compression fracture at T7 with 3 mm of posterior retropulsion contributing to central stenosis. . Benign compression fracture favored over pathologic fracture particularly in light of the relatively normal appearance of this vertebra on 08/12/2013.  Review of the MIP images confirms the above findings.  IMPRESSION: 1. No embolus identified. 2. Enlarging right upper paratracheal lymph node, 1.5 cm in short axis, possibly reactive or malignant. 3. Increasing airspace opacity at the inferior margin of the remaining right upper lobe, nonspecific but potentially from radiation pneumonitis, pneumonia, or spread of tumor. 4. Acute 90% compression fracture of the T7 vertebra with 3 mm of posterior retropulsion contributing to central stenosis. It is 5. Coronary atherosclerosis. 6. Thick pleural enhancement on the right side with right pleural effusion, similar to prior. 7. Multiple right rib fractures are osteotomies, similar to prior. 8. Prominent emphysema. 9. Airway thickening along the right hilum with occlusion or near occlusion of the right tracheobronchial tree in this vicinity.   Electronically Signed   By: Sherryl Barters M.D.   On: 01/27/2014 21:10   Dg Chest Portable 1 View  01/27/2014   CLINICAL DATA:  Shortness breath.  History of lung cancer.  EXAM: PORTABLE CHEST - 1 VIEW  COMPARISON:  12/16/2013.  FINDINGS: Stable post therapy changes right lung including multiple remote right-sided rib fractures (some which are not completely healed). Mediastinal shift to the right. Limited for evaluating for recurrent tumor or detecting underlying infiltrate/ atelectasis/ pleural effusion given the postoperative changes.  Calcified mildly tortuous aorta.  Mild pulmonary vascular prominence without frank  pulmonary edema.  IMPRESSION: Stable post therapy exam as noted above.   Electronically Signed   By: Chauncey Cruel M.D.   On: 01/27/2014 18:31    EKG: Independently reviewed. Normal sinus rhythm with Q waves in the inferolateral leads which are new.  Assessment/Plan Principal Problem:   Acute respiratory failure Active Problems:   COPD with emphysema   CARCINOMA, LUNG, SQUAMOUS CELL   OSA (obstructive sleep apnea)   HCAP (healthcare-associated pneumonia)   T7 vertebral fracture   Non-ST elevation MI (NSTEMI)   1. Acute respiratory failure probably secondary to pneumonia  and COPD - patient has been placed on vancomycin and cefepime for health care associated pneumonia given that patient was recently admitted to the hospital. Continue with nebulizers IV steroids and Pulmicort for COPD exacerbation. Closely observe in step down for now. 2. Non-ST elevation MI - patient has been placed on heparin. Cycle cardiac markers. When necessary nitroglycerin. Aspirin. Reconsult cardiology. 3. Anemia and thrombocytopenia - patient's hemoglobin has decreased compared to last month. Patient denies having seen any blood in the stools. At this time I have ordered repeat CBC stat and if there is further fall in hemoglobin given that patient has non-ST elevation MI I will go ahead and transfuse to maintain perfusion. Check stool for occult blood. 4. T7 vertebral fracture - will need MRI to evaluate for any metastatic process given patient's history of non-small cell lung cancer. MRI to be done once patient is stable. 5. Lung cancer - CT angiogram of the chest done shows increasing lymphadenopathy. This has to be addressed by oncologist. 6. Hypertension - continue home medications. 7. Hyperlipidemia - continue home medications.  I have reviewed patient's old charts and labs.  Code Status: Full code.  Family Communication: Patient's wife at the bedside.  Disposition Plan: Admit to inpatient.     Camellia Popescu N. [facility] Pager [phone].  If 7PM-7AM, please contact night-coverage [email or web address] Password [email or web address] 01/28/2014, 1:14 AM

## 2014-01-28 NOTE — Consult Note (Signed)
Name: Todd Villanueva MRN: [id number] DOB: [birth year]    ADMISSION DATE:  01/27/2014 CONSULTATION DATE:  01/28/2014  REFERRING MD :  [name] PRIMARY SERVICE:  Internal Medicine  CHIEF COMPLAINT:  SOB and cough  BRIEF PATIENT DESCRIPTION: 73 y.o. 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  [name] as outpatient, presents with worsening cough, wheezing, and SOB.  PCCM called for evaluation and assistance with medical care.  SIGNIFICANT EVENTS / STUDIES:  2/23 - admitted with SOB/cough, CXR suggestive of HCAP,   LINES / TUBES: PIV  CULTURES: MRSA 2/24 >>> negative  ANTIBIOTICS: Zosyn 2/23 x 1 Cefepime 2/24 >>> Vancomycin 2/23 >>>  HISTORY OF PRESENT ILLNESS:  [name] is a 73 y.o. Male with a PMH of COPD, non-small cell lung cancer (stage 2A/3B, s.p RML and RLL lobectomy in 2012 and s.p chemo rad in 2012 and since then in remisssion/observation with last onc visit sept 2012), hypertension who was recently admitted last month for COPD exacerbation has been experiencing persistent cough over the last 2 days. Denies any chest pain but reports that over the last month, he has had a sensation of heaviness over his rib cage. In the ER patient was found to be wheezing and tachycardic. Patient had a CT angiogram of the chest which showed increasing x-rays opacity at the inferior margin of the right upper lobe concerning for possible pneumonia. Patient has been empirically started on antibiotics for health care associated pneumonia and was placed on nebulizer and steroids for COPD exacerbation. Labs showed increased troponin with EKG showing new Q waves in the inferolateral leads. On-call cardiologist Dr. Aundra Dubin was consulted by the ER physician and advised that the patient to be placed on IV heparin and cycle cardiac markers. At the time, pt was chest pain-free. CT angiogram of the chest in addition also showed T7 compression fracture. Patient states over the last one month  he has had some heaviness in the lower part of his chest.  Denies any incontinence of urine or bowel or any focal deficits. Of note, pt seen by PCP on 2/22 and was started on azithromycin. In ED, pt was given Zosyn and started on Cefepime and Vanc. Currently pt reports improvement in wheezing/breathing.  Denies chest pain, N/V/D, abdominal pain, fevers/chills/sweats.   PAST MEDICAL HISTORY :  Past Medical History  Diagnosis Date  . Hypertension   . lung ca dx'd 02/2011    xrt/chemo comp 03/30/11, stage IIIA  . Hyperlipidemia   . 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 Right vats, rt thoracotomy, rt middle and rt lower  lobectomy  06/10/2011    Dr Arlyce Dice  . Knee arthroscopy    . Tonsillectomy    . Coronary stent placement  JULY,AUGUST 2011  . Orif femoral neck fracture w/ dhs     Prior to Admission medications   Medication Sig Start Date End Date Taking? Authorizing Provider  ALPRAZolam Duanne Moron) 0.25 MG tablet Take 0.25 mg by mouth as needed for anxiety (prior to opthalmic injections.).   Yes Historical Provider, MD  azithromycin (ZITHROMAX) 250 MG tablet Take 250 mg by mouth daily.   Yes Historical Provider, MD  bevacizumab (AVASTIN) 1.25 mg/0.1 mL SOLN 1.25 mg by Intravitreal route every 30 (thirty) days.   Yes Hayden Pedro, MD  citalopram (CELEXA) 20  MG tablet Take 20 mg by mouth daily.     Yes Historical Provider, MD  FeFum-FePoly-FA-B Cmp-C-Biot (FOLIVANE-PLUS) CAPS Take 1 capsule by mouth daily. 09/06/13  Yes [name], MD  furosemide (LASIX) 20 MG tablet One daily as needed for leg sweeling 01/07/14  Yes Tanda Rockers, MD  levalbuterol Penne Lash) 0.63 MG/3ML nebulizer solution Take 0.63 mg by nebulization 3 (three) times daily as needed for wheezing or shortness of breath.   Yes Historical Provider, MD  olmesartan (BENICAR) 20 MG tablet Take 20 mg by  mouth daily.   Yes Historical Provider, MD  omeprazole (PRILOSEC) 20 MG capsule Take 20 mg by mouth daily.     Yes Historical Provider, MD  simvastatin (ZOCOR) 40 MG tablet Take 40 mg by mouth every Monday, Wednesday, and Friday.    Yes Historical Provider, MD  temazepam (RESTORIL) 30 MG capsule Take 1 capsule (30 mg total) by mouth at bedtime as needed for sleep. 12/20/13  Yes Josetta Huddle, MD   Allergies  Allergen Reactions  . Hydrocodone-Acetaminophen     REACTION: Itch  . Oxycodone Itching  . Tramadol     Pt states will not take med b/c it does not work   . Zolpidem Tartrate Other (See Comments)    nightmares    FAMILY HISTORY:  Family History  Problem Relation Age of Onset  . Cancer Father     multiple myeloma  . COPD Sister     emphysema- smoked  . Cancer Brother     multiple myeloma   SOCIAL HISTORY:  reports that he quit smoking about 3 months ago. His smoking use included Cigarettes. He has a 26.5 pack-year smoking history. He has never used smokeless tobacco. He reports that he drinks alcohol. He reports that he does not use illicit drugs.  REVIEW OF SYSTEMS:  Completed and negative except as stated in HPI.  SUBJECTIVE: Pt reports some improvement in his respiratory effort since admission.  Is wheezing less now vs. Pre-admission.  Denies any true chest pain but does report chest tightness.  VITAL SIGNS: Temp:  [97.3 F (36.3 C)-98.8 F (37.1 C)] 97.8 F (36.6 C) (02/24 0924) Pulse Rate:  [98-131] 102 (02/24 0915) Resp:  [10-28] 28 (02/24 0915) BP: (88-146)/(46-82) 138/71 mmHg (02/24 0915) SpO2:  [95 %-100 %] 98 % (02/24 0915) Weight:  [174 lb 6.1 oz (79.1 kg)-187 lb (84.823 kg)] 174 lb 6.1 oz (79.1 kg) (02/24 0500)  PHYSICAL EXAMINATION: General: Pleasant male, resting in bed, in NAD on 3L [place]. Neuro: A&O x 3, non-focal.  HEENT: /AT. PERRL, sclerae anicteric. Cardiovascular: RRR, no M/R/G.  Lungs: Respirations even and unlabored.  Scattered wheezes on left,  diffuse on right with audible rhonchi.   Abdomen: BS x 4, soft, NT/ND.  Musculoskeletal: No gross deformities, no edema.  Skin: Intact, warm, no rashes.  PULMONARY  Recent Labs Lab 01/27/14 1812  TCO2 26    CBC  Recent Labs Lab 01/27/14 1740 01/27/14 1812 01/28/14 0302 01/28/14 0510  HGB 9.9* 9.5* 8.5* 8.1*  HCT 29.1* 28.0* 25.1* 23.7*  WBC 12.9*  --  10.5 8.8  PLT 134*  --  121* 124*    COAGULATION  Recent Labs Lab 01/27/14 1740  INR 0.96    CARDIAC   Recent Labs Lab 01/27/14 2035 01/28/14 0510 01/28/14 0950  TROPONINI 0.55* 0.66* 1.31*    Recent Labs Lab 01/27/14 1740  PROBNP 2835.0*     CHEMISTRY  Recent Labs Lab 01/27/14 1812 01/28/14 0510  NA 133* 137  K 3.8 3.6*  CL 98 101  CO2  --  17*  GLUCOSE 104* 298*  BUN 27* 26*  CREATININE 1.30 1.26  CALCIUM  --  8.0*   Estimated Creatinine Clearance: 51 ml/min (by C-G formula based on Cr of 1.26).   LIVER  Recent Labs Lab 01/27/14 1740 01/28/14 0510  AST  --  25  ALT  --  26  ALKPHOS  --  156*  BILITOT  --  0.4  PROT  --  6.2  ALBUMIN  --  2.3*  INR 0.96  --      INFECTIOUS  Recent Labs Lab 01/27/14 1943  LATICACIDVEN 1.79     ENDOCRINE CBG (last 3)  No results found for this basename: GLUCAP,  in the last 72 hours       IMAGING x48h  Ct Angio Chest Pe W/cm &/or Wo Cm  01/27/2014   CLINICAL DATA:  Lung cancer, prior right middle and lower lobectomies. Shortness of breath. Cough. Chest pain.  EXAM: CT ANGIOGRAPHY CHEST WITH CONTRAST  TECHNIQUE: Multidetector CT imaging of the chest was performed using the standard protocol during bolus administration of intravenous contrast. Multiplanar CT image reconstructions and MIPs were obtained to evaluate the vascular anatomy.  CONTRAST:  141m OMNIPAQUE IOHEXOL 350 MG/ML SOLN  COMPARISON:  DG CHEST 1V PORT dated 01/27/2014; CT CHEST W/CM dated 08/12/2013; CT CHEST W/CM dated 02/11/2013; DG CHEST 2 VIEW dated 12/16/2013   FINDINGS: The patient received 2 boluses of contrast. The first was a 50 cc bolus of Omnipaque 350 which triggered the pressure limit. Repeated exam with 80 cc of contrast performed with much better opacification of the vascular structures. Similar truncated appearance of a right pulmonary artery due to the lobectomies. No visualized filling defects in the remaining right pulmonary arterial tree.  A mixed density appearance in the left lower lobe pulmonary arterial structures appears to correlate directly with readily observable motion artifact on the lung window images, and does not have a characteristic morphology for pulmonary embolus. Accordingly, there is abnormal hypodensity is attributed to artifact for example on image 165 of series 16.  Right upper paratracheal lymph node, 1.5 cm in short axis on image 18 of series 15 (formerly 1.1 cm). Left prevascular node 0.9 cm in short axis (formerly 0.7 cm).  Coronary atherosclerosis. Thick pleural enhancement on the right side with associated pleural fluid. Increase in interstitial and airspace opacity in the base of the right lung as on image 56 of series 17. Multiple right rib fractures or osteotomies, similar to prior. Prominent emphysema noted. 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  There is a new 90% compression fracture at T7 with 3 mm of posterior retropulsion contributing to central stenosis. . Benign compression fracture favored over pathologic fracture particularly in light of the relatively normal appearance of this vertebra on 08/12/2013.  Review of the MIP images confirms the above findings.  IMPRESSION: 1. No embolus identified. 2. Enlarging right upper paratracheal lymph node, 1.5 cm in short axis, possibly reactive or malignant. 3. Increasing airspace opacity at the inferior margin of the remaining right upper lobe, nonspecific but potentially from radiation  pneumonitis, pneumonia, or spread of tumor. 4. Acute 90% compression fracture of the T7 vertebra with 3 mm of posterior retropulsion contributing to central stenosis. It is 5. Coronary atherosclerosis. 6. Thick pleural enhancement on the right side with right pleural effusion,  similar to prior. 7. Multiple right rib fractures are osteotomies, similar to prior. 8. Prominent emphysema. 9. Airway thickening along the right hilum with occlusion or near occlusion of the right tracheobronchial tree in this vicinity.   Electronically Signed   By: Sherryl Barters M.D.   On: 01/27/2014 21:10   Dg Chest Portable 1 View  01/27/2014   CLINICAL DATA:  Shortness breath.  History of lung cancer.  EXAM: PORTABLE CHEST - 1 VIEW  COMPARISON:  12/16/2013.  FINDINGS: Stable post therapy changes right lung including multiple remote right-sided rib fractures (some which are not completely healed). Mediastinal shift to the right. Limited for evaluating for recurrent tumor or detecting underlying infiltrate/ atelectasis/ pleural effusion given the postoperative changes.  Calcified mildly tortuous aorta.  Mild pulmonary vascular prominence without frank pulmonary edema.  IMPRESSION: Stable post therapy exam as noted above.   Electronically Signed   By: Chauncey Cruel M.D.   On: 01/27/2014 18:31         ASSESSMENT / PLAN:  Acute on Chronic Respiratory Failure - unknown etiology at this time, likely multifactorial.  Given elevated BNP, positive troponins and EKG changes, could be secondary to a cardiac event/insult as primary etiology of wheeze  Concern for HCAP or viral pneumonitis +/- AECOPD- unclear whether this is the cause of SOB given normal temperature and WBC count. However, he is a candidate for infection given cancer, distorted anatomy, winter season and recent [place].   Squamous Cell Lung Cancer:   - no evidence of recurrence on CT FEb 2015 compared to sept 2014   Plan: - Change Budesonide from 0.74m BID  to 0.516mBID. - Scheduled Levalbulterol/Ipratroprium q4h - Change PRN Levalbuterol from q6 to q3h prn - Continue Solumedrol IV daily. - Continue antibiotics for now - Send PCT algorithm to guide therapy (cause/etiology of resp distress remains unclear at this time). - SEnd Resp Virus PCR - Send sputum cultures. - Given elevated BNP, consider further diuresis today (Cr seems to be around 1.2 - 1.3 at baseline).  Will defer to cardiology given inferior lead EKG changes. - Cardiology has been consulted, await their rec's. - Oncology seeing patient this AM, F/u on their rec's.   All other problems, manage per primary team.  Thank you for the consult and allowing [facility] participate in Mr. Slay care.  RaMontey HoraPAByramulmonary & Critical Care Pgr: [phone]  or [phone] STAFF NOTE: I, Dr M Ann Lionsave personally reviewed patient's available data, including medical history, events of note, physical examination and test results as part of my evaluation. I have discussed with resident/NP and other care providers such as pharmacist, RN and RRT.  In addition,  I personally evaluated patient and elicited key findings of  Acute on chronic resp failure. Very hard to figure out what is going on. I think primary need to rule out cardiac etiology. Next Rx for AECOPD but will assess for viral pneumonitis through PCR. No evidence of cancer recurrence. Some of his chest pain could be due to t7 frature and chronic rib fracture  Rest per NP/medical resident whose note is outlined above and that I agree with     Dr. MuBrand MalesM.D., F.[employer].P Pulmonary and Critical Care Medicine Staff Physician CoMorrisulmonary and Critical Care Pager: [phone] no answer or between  15:00h - 7:00h: call [phone]  01/28/2014 12:37 PM

## 2014-01-28 NOTE — Progress Notes (Signed)
ANTICOAGULATION CONSULT NOTE - Follow Up Consult  Pharmacy Consult for Heparin Indication: chest pain/ACS  Allergies  Allergen Reactions  . Hydrocodone-Acetaminophen     REACTION: Itch  . Oxycodone Itching  . Tramadol     Pt states will not take med b/c it does not work   . Zolpidem Tartrate Other (See Comments)    nightmares    Patient Measurements: Height: 5\' 7"  (170.2 cm) Weight: 174 lb 6.1 oz (79.1 kg) IBW/kg (Calculated) : 66.1 Heparin Dosing Weight:   Vital Signs: Temp: 98.1 F (36.7 C) (02/24 2153) Temp src: Oral (02/24 2153) BP: 127/78 mmHg (02/24 2153) Pulse Rate: 90 (02/24 2153)  Labs:  Recent Labs  01/27/14 1740 01/27/14 1812  01/28/14 0302 01/28/14 0510 01/28/14 0950 01/28/14 2058  HGB 9.9* 9.5*  --  8.5* 8.1*  --  10.2*  HCT 29.1* 28.0*  --  25.1* 23.7*  --  29.2*  PLT 134*  --   --  121* 124*  --  120*  LABPROT 12.6  --   --   --   --   --   --   INR 0.96  --   --   --   --   --   --   HEPARINUNFRC  --   --   --  0.48  --   --  0.47  CREATININE  --  1.30  --   --  1.26  --   --   TROPONINI  --   --   < >  --  0.66* 1.31* 1.55*  < > = values in this interval not displayed.  Estimated Creatinine Clearance: 51 ml/min (by C-G formula based on Cr of 1.26).   Medications:  Heparin 1300 units/hr  Assessment: 72yom on heparin for CP/ACS. Heparin level (0.47) remains therapeutic - will continue current rate and follow-up AM labs and plan. - Hg up to 10.2 s/p transfusion - No significant bleeding reported  Goal of Therapy:  Heparin level 0.3-0.7 units/ml Monitor platelets by anticoagulation protocol: Yes   Plan:  1. Continue heparin 1300 units/hr (13 ml/hr) 2. Follow-up AM labs and cardiology plan  Earleen Newport [phone] 01/28/2014,9:56 PM

## 2014-01-28 NOTE — Progress Notes (Signed)
Nutrition Brief Note  Patient identified on the Malnutrition Screening Tool (MST) Report  Wt Readings from Last 15 Encounters:  01/28/14 174 lb 6.1 oz (79.1 kg)  01/07/14 180 lb 9.6 oz (81.92 kg)  12/20/13 179 lb 11.2 oz (81.511 kg)  08/13/13 187 lb 4.8 oz (84.959 kg)  02/12/13 184 lb 12.8 oz (83.825 kg)  11/19/12 182 lb (82.555 kg)  10/15/12 178 lb 12.8 oz (81.103 kg)  07/11/12 170 lb 4.8 oz (77.248 kg)  05/16/12 168 lb 6.4 oz (76.386 kg)  04/13/12 166 lb 9.6 oz (75.569 kg)  04/11/12 165 lb 3.2 oz (74.934 kg)  01/24/12 172 lb 8 oz (78.245 kg)  01/12/12 172 lb 8 oz (78.245 kg)  10/10/11 165 lb 1.6 oz (74.889 kg)  10/08/11 160 lb 9.6 oz (72.848 kg)    Body mass index is 27.31 kg/(m^2). Patient meets criteria for overweight based on current BMI.   Current diet order is heart healthy, patient is consuming approximately 75% of meals at this time. Labs and medications reviewed.  Pt reports that his appetite is good, and that he needs no nutritional supplementation at this time.   No nutrition interventions warranted at this time. If nutrition issues arise, please consult RD.   Terrace Arabia RD, LDN

## 2014-01-28 NOTE — Consult Note (Signed)
[place] for Heparin Indication: ACS  Allergies  Allergen Reactions  . Hydrocodone-Acetaminophen     REACTION: Itch  . Oxycodone Itching  . Tramadol     Pt states will not take med b/c it does not work   . Zolpidem Tartrate Other (See Comments)    nightmares    Patient Measurements: Height: 5\' 7"  (170.2 cm) Weight: 174 lb 6.1 oz (79.1 kg) IBW/kg (Calculated) : 66.1  Vital Signs: Temp: 97.5 F (36.4 C) (02/24 0300) Temp src: Oral (02/24 0300) BP: 118/57 mmHg (02/24 0300) Pulse Rate: 105 (02/24 0300)  Labs:  Recent Labs  01/27/14 1740 01/27/14 1812 01/27/14 2035 01/28/14 0302  HGB 9.9* 9.5*  --  8.5*  HCT 29.1* 28.0*  --  25.1*  PLT 134*  --   --  121*  LABPROT 12.6  --   --   --   INR 0.96  --   --   --   HEPARINUNFRC  --   --   --  0.48  CREATININE  --  1.30  --   --   TROPONINI  --   --  0.55*  --     Estimated Creatinine Clearance: 49.4 ml/min (by C-G formula based on Cr of 1.3).  Assessment: 73 yo male with elevated cardiac markers, possible NSTEMI, for heparin.  Chest CT negative for PE  Goal of Therapy:  Heparin level 0.3-0.7 units/ml Monitor platelets by anticoagulation protocol: Yes   Plan:  Continue Heparin at current rate Recheck level this afternoon to verify  Caryl Pina 01/28/2014,3:44 AM

## 2014-01-28 NOTE — Progress Notes (Signed)
  Echocardiogram 2D Echocardiogram has been performed.  Todd Villanueva 01/28/2014, 2:35 PM

## 2014-01-28 NOTE — Consult Note (Signed)
Admit date: 01/27/2014 Referring Physician  Dr. [name] Primary Physician [name], MD Primary Cardiologist  Dr. Marlou Porch Reason for Consultation   Positive troponin  HPI: 73 year old male with coronary artery disease status post both RCA as well as LAD stents, last catheterization in 2010, last stress test 04/12/13 with inferior infarct but no ischemia, echocardiogram EF of 55% here with acute respiratory failure, hypoxia, COPD exacerbation. Has a history of lung cancer status post chemotherapy in 2012, currently in remission. He's been having progressive worsening shortness of breath and cough despite antibiotics as an outpatient. Here in the hospital, was found to be anemic with hemoglobin of 8.1 and is currently undergoing transfusion. Troponin was obtained and mildly positive at 0.66, most recent 1.31.  Pulmonary has recommended increasing steroid therapy, antibiotics, nebulizer treatment.  He is not state that he has any current active chest pain but his chest wall is sore at times secondary to cough.  A CT angiogram of the chest showed no evidence of pulmonary embolism however there was enlarging paratracheal lymph node of 1.5 cm, increased airspace opacity in right upper lobe, nonspecific, acute compression fracture of T7 vertebra. These findings lead the possibility of pneumonitis, malignancy.    PMH:   Past Medical History  Diagnosis Date  . Hypertension   . lung ca dx'd 02/2011    xrt/chemo comp 03/30/11, stage IIIA  . Hyperlipidemia   . 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 Right vats, rt thoracotomy, rt middle and rt lower  lobectomy  06/10/2011    Dr Arlyce Dice  . Knee arthroscopy    . Tonsillectomy    . Coronary stent placement  JULY,AUGUST 2011  . Orif femoral neck fracture w/ dhs     Allergies:  Hydrocodone-acetaminophen;  Oxycodone; Tramadol; and Zolpidem tartrate Prior to Admit Meds:   Prior to Admission medications   Medication Sig Start Date End Date Taking? Authorizing Provider  ALPRAZolam Duanne Moron) 0.25 MG tablet Take 0.25 mg by mouth as needed for anxiety (prior to opthalmic injections.).   Yes Historical Provider, MD  azithromycin (ZITHROMAX) 250 MG tablet Take 250 mg by mouth daily.   Yes Historical Provider, MD  bevacizumab (AVASTIN) 1.25 mg/0.1 mL SOLN 1.25 mg by Intravitreal route every 30 (thirty) days.   Yes Hayden Pedro, MD  citalopram (CELEXA) 20 MG tablet Take 20 mg by mouth daily.     Yes Historical Provider, MD  FeFum-FePoly-FA-B Cmp-C-Biot (FOLIVANE-PLUS) CAPS Take 1 capsule by mouth daily. 09/06/13  Yes [name], MD  furosemide (LASIX) 20 MG tablet One daily as needed for leg sweeling 01/07/14  Yes Tanda Rockers, MD  levalbuterol Penne Lash) 0.63 MG/3ML nebulizer solution Take 0.63 mg by nebulization 3 (three) times daily as needed for wheezing or shortness of breath.   Yes Historical Provider, MD  olmesartan (BENICAR) 20 MG tablet Take 20 mg by mouth daily.   Yes Historical Provider, MD  omeprazole (PRILOSEC) 20 MG capsule Take 20 mg by mouth daily.     Yes Historical Provider, MD  simvastatin (ZOCOR) 40 MG tablet Take 40 mg by mouth every Monday, Wednesday, and Friday.    Yes Historical Provider, MD  temazepam (RESTORIL) 30 MG capsule Take 1 capsule (30 mg total) by mouth at bedtime as needed for sleep. 12/20/13  Yes Josetta Huddle, MD  Fam HX:    Family History  Problem Relation Age of Onset  . Cancer Father     multiple myeloma  . COPD Sister     emphysema- smoked  . Cancer Brother     multiple myeloma   Social HX:    History   Social History  . Marital Status: Married    Spouse Name: N/A    Number of Children: N/A  . Years of Education: N/A   Occupational History  . mail room at [employer]   Social History Main Topics  . Smoking status: Former Smoker -- 0.50  packs/day for 53 years    Types: Cigarettes    Quit date: 10/05/2013  . Smokeless tobacco: Never Used  . Alcohol Use: Yes     Comment: occasionally  . Drug Use: No  . Sexual Activity: Not on file   Other Topics Concern  . Not on file   Social History Narrative  . No narrative on file     ROS:  Positive for cough, worsening dyspnea, chest wall discomfort, denies stroke, denies syncope. All 11 ROS were addressed and are negative except what is stated in the HPI   Physical Exam: Blood pressure 144/76, pulse 99, temperature 97.5 F (36.4 C), temperature source Oral, resp. rate 22, height 5\' 7"  (1.702 m), weight 174 lb 6.1 oz (79.1 kg), SpO2 97.00%.   General: Well developed, well nourished, in mild respiratory distress Head: Eyes PERRLA, No xanthomas.   Normal cephalic and atramatic  Lungs:   Diffuse wheezes bilaterally, decreased air movement bilaterally, increased respiratory rate with mild use of accessory musculature. Heart:   HRRR S1 S2 Pulses are 2+ & equal. No murmur, rubs, gallops.  No carotid bruit. No JVD.  No abdominal bruits.  Abdomen: Bowel sounds are positive, abdomen soft and non-tender without masses. No hepatosplenomegaly. Msk:  Back normal. Normal strength and tone for age. Extremities:  No clubbing, cyanosis or edema.  DP +1 Neuro: Alert and oriented X 3, non-focal, MAE x 4 GU: Deferred Rectal: Deferred Psych:  Good affect, responds appropriately      Labs: Lab Results  Component Value Date   WBC 8.8 01/28/2014   HGB 8.1* 01/28/2014   HCT 23.7* 01/28/2014   MCV 97.1 01/28/2014   PLT 124* 01/28/2014     Recent Labs Lab 01/28/14 0510  NA 137  K 3.6*  CL 101  CO2 17*  BUN 26*  CREATININE 1.26  CALCIUM 8.0*  PROT 6.2  BILITOT 0.4  ALKPHOS 156*  ALT 26  AST 25  GLUCOSE 298*    Recent Labs  01/27/14 2035 01/28/14 0510 01/28/14 0950  TROPONINI 0.55* 0.66* 1.31*      Radiology:  Ct Angio Chest Pe W/cm &/or Wo Cm  01/27/2014   CLINICAL  DATA:  Lung cancer, prior right middle and lower lobectomies. Shortness of breath. Cough. Chest pain.  EXAM: CT ANGIOGRAPHY CHEST WITH CONTRAST  TECHNIQUE: Multidetector CT imaging of the chest was performed using the standard protocol during bolus administration of intravenous contrast. Multiplanar CT image reconstructions and MIPs were obtained to evaluate the vascular anatomy.  CONTRAST:  01/29/2014 OMNIPAQUE IOHEXOL 350 MG/ML SOLN  COMPARISON:  DG CHEST 1V PORT dated 01/27/2014; CT CHEST W/CM dated 08/12/2013; CT CHEST W/CM dated 02/11/2013; DG CHEST 2 VIEW dated 12/16/2013  FINDINGS: The patient received 2 boluses of contrast. The first was a 50 cc bolus of Omnipaque 350 which triggered the pressure limit. Repeated exam with 80  cc of contrast performed with much better opacification of the vascular structures. Similar truncated appearance of a right pulmonary artery due to the lobectomies. No visualized filling defects in the remaining right pulmonary arterial tree.  A mixed density appearance in the left lower lobe pulmonary arterial structures appears to correlate directly with readily observable motion artifact on the lung window images, and does not have a characteristic morphology for pulmonary embolus. Accordingly, there is abnormal hypodensity is attributed to artifact for example on image 165 of series 16.  Right upper paratracheal lymph node, 1.5 cm in short axis on image 18 of series 15 (formerly 1.1 cm). Left prevascular node 0.9 cm in short axis (formerly 0.7 cm).  Coronary atherosclerosis. Thick pleural enhancement on the right side with associated pleural fluid. Increase in interstitial and airspace opacity in the base of the right lung as on image 56 of series 17. Multiple right rib fractures or osteotomies, similar to prior. Prominent emphysema noted. 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  There is a new 90% compression  fracture at T7 with 3 mm of posterior retropulsion contributing to central stenosis. . Benign compression fracture favored over pathologic fracture particularly in light of the relatively normal appearance of this vertebra on 08/12/2013.  Review of the MIP images confirms the above findings.  IMPRESSION: 1. No embolus identified. 2. Enlarging right upper paratracheal lymph node, 1.5 cm in short axis, possibly reactive or malignant. 3. Increasing airspace opacity at the inferior margin of the remaining right upper lobe, nonspecific but potentially from radiation pneumonitis, pneumonia, or spread of tumor. 4. Acute 90% compression fracture of the T7 vertebra with 3 mm of posterior retropulsion contributing to central stenosis. It is 5. Coronary atherosclerosis. 6. Thick pleural enhancement on the right side with right pleural effusion, similar to prior. 7. Multiple right rib fractures are osteotomies, similar to prior. 8. Prominent emphysema. 9. Airway thickening along the right hilum with occlusion or near occlusion of the right tracheobronchial tree in this vicinity.   Electronically Signed   By: Sherryl Barters M.D.   On: 01/27/2014 21:10   Dg Chest Portable 1 View  01/27/2014   CLINICAL DATA:  Shortness breath.  History of lung cancer.  EXAM: PORTABLE CHEST - 1 VIEW  COMPARISON:  12/16/2013.  FINDINGS: Stable post therapy changes right lung including multiple remote right-sided rib fractures (some which are not completely healed). Mediastinal shift to the right. Limited for evaluating for recurrent tumor or detecting underlying infiltrate/ atelectasis/ pleural effusion given the postoperative changes.  Calcified mildly tortuous aorta.  Mild pulmonary vascular prominence without frank pulmonary edema.  IMPRESSION: Stable post therapy exam as noted above.   Electronically Signed   By: Chauncey Cruel M.D.   On: 01/27/2014 18:31   Personally viewed.  EKG: 01/27/14-sinus tachycardia rate 120 with inferior infarct  pattern, nonspecific T-wave changes, inferior infarct pattern is new when compared to 12/16/13. However, inferior infarct was seen on nuclear stress test 04/12/13. Personally viewed.   ASSESSMENT/PLAN:    73 year old male with acute COPD exacerbation, anemia, coronary artery disease status post RCA and LAD stents, mildly elevated troponin.  1. Elevated troponin-non-ST elevation myocardial infarction, likely type II in the setting of demand ischemia as a result of respiratory distress. Transfusion currently taking place to improve oxygenation. I will check an echocardiogram to determine recent ejection fraction. Once he is improved from a respiratory standpoint, it would not be  unreasonable to evaluate his coronary arteries given the elevated troponin with cardiac catheterization however. We would absolutely have to take into account however the possibility of return of  malignancy and would like to see what Dr. Earlie Server has to say about CT scan findings.  For now, with increasing troponin to 1.5, we will continue with IV heparin over the next 24 hours. He is currently getting aspirin. He is not on beta blocker secondary to severe COPD/bronchoconstriction. He is currently on simvastatin Monday Wednesday Friday.  2. CAD-RCA/LAD stents as described above. Last catheterization 07/06/09 with placement of LAD DES. Right coronary stents were patent at that time.  3. COPD exacerbation-per pulmonary/primary team.  4. Anemia-currently obtaining transfusion.   Candee Furbish, MD  01/28/2014  12:04 PM

## 2014-01-28 NOTE — Progress Notes (Signed)
ANTIBIOTIC CONSULT NOTE - INITIAL  Pharmacy Consult for Vancomycin and Cefepime Indication: rule out pneumonia  Allergies  Allergen Reactions  . Hydrocodone-Acetaminophen     REACTION: Itch  . Oxycodone Itching  . Tramadol     Pt states will not take med b/c it does not work   . Zolpidem Tartrate Other (See Comments)    nightmares    Patient Measurements: Height: 5\' 7"  (170.2 cm) Weight: 174 lb 6.1 oz (79.1 kg) IBW/kg (Calculated) : 66.1  Vital Signs: Temp: 97.5 F (36.4 C) (02/24 0300) Temp src: Oral (02/24 0300) BP: 118/57 mmHg (02/24 0300) Pulse Rate: 105 (02/24 0300) Intake/Output from previous day: 02/23 0701 - 02/24 0700 In: 2408.8 [I.V.:2408.8] Out: -  Intake/Output from this shift: Total I/O In: 2408.8 [I.V.:2408.8] Out: -   Labs:  Recent Labs  01/27/14 1740 01/27/14 1812 01/28/14 0302  WBC 12.9*  --  10.5  HGB 9.9* 9.5* 8.5*  PLT 134*  --  121*  CREATININE  --  1.30  --    Estimated Creatinine Clearance: 49.4 ml/min (by C-G formula based on Cr of 1.3). No results found for this basename: VANCOTROUGH, Corlis Leak, VANCORANDOM, [place], GENTPEAK, GENTRANDOM, TOBRATROUGH, TOBRAPEAK, TOBRARND, AMIKACINPEAK, AMIKACINTROU, AMIKACIN,  in the last 72 hours   Microbiology: Recent Results (from the past 720 hour(s))  MRSA PCR SCREENING     Status: None   Collection Time    01/28/14  1:24 AM      Result Value Ref Range Status   MRSA by PCR NEGATIVE  NEGATIVE Final   Comment:            The GeneXpert MRSA Assay (FDA     approved for NASAL specimens     only), is one component of a     comprehensive MRSA colonization     surveillance program. It is not     intended to diagnose MRSA     infection nor to guide or     monitor treatment for     MRSA infections.   Assessment: 73 yo male with SOB/cough, possible PNA, for empiric antibiotics  Vancomycin 1500 mg IV given in ED at [place] last night  Goal of Therapy:  Vancomycin trough level 15-20  mcg/ml  Plan:  Vancomycin 750 mg IV q12h Cefepime 1 g IV q12h  Caryl Pina 01/28/2014,4:25 AM

## 2014-01-29 ENCOUNTER — Telehealth: Payer: Self-pay | Admitting: Internal Medicine

## 2014-01-29 ENCOUNTER — Inpatient Hospital Stay (HOSPITAL_COMMUNITY): Payer: 59

## 2014-01-29 DIAGNOSIS — J209 Acute bronchitis, unspecified: Secondary | ICD-10-CM

## 2014-01-29 DIAGNOSIS — J449 Chronic obstructive pulmonary disease, unspecified: Secondary | ICD-10-CM

## 2014-01-29 DIAGNOSIS — J9600 Acute respiratory failure, unspecified whether with hypoxia or hypercapnia: Secondary | ICD-10-CM

## 2014-01-29 DIAGNOSIS — R799 Abnormal finding of blood chemistry, unspecified: Secondary | ICD-10-CM

## 2014-01-29 DIAGNOSIS — I2510 Atherosclerotic heart disease of native coronary artery without angina pectoris: Secondary | ICD-10-CM

## 2014-01-29 DIAGNOSIS — I5041 Acute combined systolic (congestive) and diastolic (congestive) heart failure: Secondary | ICD-10-CM | POA: Diagnosis present

## 2014-01-29 DIAGNOSIS — J438 Other emphysema: Secondary | ICD-10-CM

## 2014-01-29 LAB — CBC
HEMATOCRIT: 30.4 % — AB (ref 39.0–52.0)
HEMOGLOBIN: 10.5 g/dL — AB (ref 13.0–17.0)
MCH: 32.3 pg (ref 26.0–34.0)
MCHC: 34.5 g/dL (ref 30.0–36.0)
MCV: 93.5 fL (ref 78.0–100.0)
Platelets: 131 10*3/uL — ABNORMAL LOW (ref 150–400)
RBC: 3.25 MIL/uL — AB (ref 4.22–5.81)
RDW: 15.9 % — ABNORMAL HIGH (ref 11.5–15.5)
WBC: 14.2 10*3/uL — AB (ref 4.0–10.5)

## 2014-01-29 LAB — BASIC METABOLIC PANEL
BUN: 26 mg/dL — ABNORMAL HIGH (ref 6–23)
CO2: 21 meq/L (ref 19–32)
Calcium: 8.1 mg/dL — ABNORMAL LOW (ref 8.4–10.5)
Chloride: 104 mEq/L (ref 96–112)
Creatinine, Ser: 1.52 mg/dL — ABNORMAL HIGH (ref 0.50–1.35)
GFR calc non Af Amer: 45 mL/min — ABNORMAL LOW (ref >90)
GFR, EST AFRICAN AMERICAN: 52 mL/min — AB (ref >90)
GLUCOSE: 131 mg/dL — AB (ref 70–99)
POTASSIUM: 3.7 meq/L (ref 3.7–5.3)
Sodium: 140 mEq/L (ref 137–147)

## 2014-01-29 LAB — TYPE AND SCREEN
ABO/RH(D): O POS
Antibody Screen: NEGATIVE
UNIT DIVISION: 0
Unit division: 0

## 2014-01-29 LAB — BLOOD GAS, ARTERIAL
ACID-BASE DEFICIT: 2.6 mmol/L — AB (ref 0.0–2.0)
Bicarbonate: 21.6 mEq/L (ref 20.0–24.0)
DRAWN BY: 12971
O2 Content: 6 L/min
O2 Saturation: 88.6 %
PH ART: 7.386 (ref 7.350–7.450)
Patient temperature: 98.6
TCO2: 22.8 mmol/L (ref 0–100)
pCO2 arterial: 36.9 mmHg (ref 35.0–45.0)
pO2, Arterial: 58.2 mmHg — ABNORMAL LOW (ref 80.0–100.0)

## 2014-01-29 LAB — TROPONIN I: Troponin I: 1.28 ng/mL (ref <0.30)

## 2014-01-29 LAB — RESPIRATORY VIRUS PANEL
Adenovirus: NOT DETECTED
Influenza A H1: NOT DETECTED
Influenza A H3: DETECTED — AB
Influenza A: DETECTED — AB
Influenza B: NOT DETECTED
Metapneumovirus: NOT DETECTED
PARAINFLUENZA 1 A: NOT DETECTED
PARAINFLUENZA 2 A: NOT DETECTED
PARAINFLUENZA 3 A: NOT DETECTED
RHINOVIRUS: NOT DETECTED
Respiratory Syncytial Virus A: NOT DETECTED
Respiratory Syncytial Virus B: NOT DETECTED

## 2014-01-29 LAB — VANCOMYCIN, TROUGH: VANCOMYCIN TR: 21.2 ug/mL — AB (ref 10.0–20.0)

## 2014-01-29 LAB — HEPARIN LEVEL (UNFRACTIONATED): HEPARIN UNFRACTIONATED: 0.62 [IU]/mL (ref 0.30–0.70)

## 2014-01-29 LAB — PROCALCITONIN: Procalcitonin: 2.97 ng/mL

## 2014-01-29 MED ORDER — NITROGLYCERIN 2 % TD OINT
0.5000 [in_us] | TOPICAL_OINTMENT | Freq: Four times a day (QID) | TRANSDERMAL | Status: AC
  Administered 2014-01-29: 0.5 [in_us] via TOPICAL
  Filled 2014-01-29: qty 30

## 2014-01-29 MED ORDER — ASPIRIN EC 325 MG PO TBEC
325.0000 mg | DELAYED_RELEASE_TABLET | ORAL | Status: DC
  Administered 2014-01-29 – 2014-02-01 (×4): 325 mg via ORAL
  Filled 2014-01-29 (×6): qty 1

## 2014-01-29 MED ORDER — METHYLPREDNISOLONE SODIUM SUCC 125 MG IJ SOLR
60.0000 mg | Freq: Four times a day (QID) | INTRAMUSCULAR | Status: DC
Start: 2014-01-29 — End: 2014-01-29
  Filled 2014-01-29 (×4): qty 0.96

## 2014-01-29 MED ORDER — POTASSIUM CHLORIDE CRYS ER 20 MEQ PO TBCR
40.0000 meq | EXTENDED_RELEASE_TABLET | Freq: Two times a day (BID) | ORAL | Status: DC
  Administered 2014-01-29 – 2014-01-30 (×3): 40 meq via ORAL
  Filled 2014-01-29 (×6): qty 2

## 2014-01-29 MED ORDER — FUROSEMIDE 10 MG/ML IJ SOLN
60.0000 mg | Freq: Three times a day (TID) | INTRAMUSCULAR | Status: DC
  Administered 2014-01-30 (×2): 60 mg via INTRAVENOUS
  Filled 2014-01-29 (×3): qty 6

## 2014-01-29 MED ORDER — FUROSEMIDE 10 MG/ML IJ SOLN
40.0000 mg | Freq: Once | INTRAMUSCULAR | Status: AC
  Administered 2014-01-29: 40 mg via INTRAVENOUS
  Filled 2014-01-29: qty 4

## 2014-01-29 MED ORDER — LEVALBUTEROL HCL 0.63 MG/3ML IN NEBU
0.6300 mg | INHALATION_SOLUTION | Freq: Four times a day (QID) | RESPIRATORY_TRACT | Status: DC
  Administered 2014-01-30 – 2014-02-02 (×13): 0.63 mg via RESPIRATORY_TRACT
  Filled 2014-01-29 (×28): qty 3

## 2014-01-29 MED ORDER — FUROSEMIDE 10 MG/ML IJ SOLN
40.0000 mg | Freq: Two times a day (BID) | INTRAMUSCULAR | Status: DC
Start: 2014-01-29 — End: 2014-01-29
  Administered 2014-01-29: 40 mg via INTRAVENOUS
  Filled 2014-01-29: qty 4

## 2014-01-29 MED ORDER — SIMVASTATIN 40 MG PO TABS
40.0000 mg | ORAL_TABLET | ORAL | Status: DC
  Administered 2014-01-29 – 2014-02-05 (×4): 40 mg via ORAL
  Filled 2014-01-29 (×4): qty 1

## 2014-01-29 MED ORDER — PANTOPRAZOLE SODIUM 40 MG PO TBEC
40.0000 mg | DELAYED_RELEASE_TABLET | ORAL | Status: DC
  Administered 2014-01-29 – 2014-02-02 (×5): 40 mg via ORAL
  Filled 2014-01-29 (×4): qty 1

## 2014-01-29 MED ORDER — FUROSEMIDE 10 MG/ML IJ SOLN
60.0000 mg | Freq: Two times a day (BID) | INTRAMUSCULAR | Status: DC
  Administered 2014-01-29: 60 mg via INTRAVENOUS
  Filled 2014-01-29: qty 6

## 2014-01-29 MED ORDER — IPRATROPIUM BROMIDE 0.02 % IN SOLN
0.5000 mg | Freq: Four times a day (QID) | RESPIRATORY_TRACT | Status: DC
  Administered 2014-01-30 – 2014-02-02 (×13): 0.5 mg via RESPIRATORY_TRACT
  Filled 2014-01-29 (×16): qty 2.5

## 2014-01-29 MED ORDER — FE FUMARATE-B12-VIT C-FA-IFC PO CAPS
1.0000 | ORAL_CAPSULE | ORAL | Status: DC
  Administered 2014-01-29 – 2014-02-05 (×8): 1 via ORAL
  Filled 2014-01-29 (×10): qty 1

## 2014-01-29 MED ORDER — FUROSEMIDE 10 MG/ML IJ SOLN
INTRAMUSCULAR | Status: AC
  Filled 2014-01-29: qty 8

## 2014-01-29 MED ORDER — METHYLPREDNISOLONE SODIUM SUCC 125 MG IJ SOLR
125.0000 mg | Freq: Four times a day (QID) | INTRAMUSCULAR | Status: DC
  Administered 2014-01-29 – 2014-01-31 (×8): 125 mg via INTRAVENOUS
  Filled 2014-01-29 (×12): qty 2

## 2014-01-29 MED ORDER — POTASSIUM CHLORIDE CRYS ER 20 MEQ PO TBCR
40.0000 meq | EXTENDED_RELEASE_TABLET | Freq: Two times a day (BID) | ORAL | Status: DC
  Filled 2014-01-29: qty 2

## 2014-01-29 MED ORDER — METOLAZONE 5 MG PO TABS
5.0000 mg | ORAL_TABLET | Freq: Every day | ORAL | Status: AC
  Administered 2014-01-29: 5 mg via ORAL
  Filled 2014-01-29: qty 1

## 2014-01-29 MED ORDER — DEXTROSE 5 % IV SOLN
1.0000 g | INTRAVENOUS | Status: DC
  Administered 2014-01-30 – 2014-02-04 (×6): 1 g via INTRAVENOUS
  Filled 2014-01-29 (×7): qty 1

## 2014-01-29 MED ORDER — LEVOFLOXACIN IN D5W 750 MG/150ML IV SOLN
750.0000 mg | INTRAVENOUS | Status: DC
  Administered 2014-01-29: 750 mg via INTRAVENOUS
  Filled 2014-01-29 (×2): qty 150

## 2014-01-29 MED ORDER — IRBESARTAN 150 MG PO TABS
150.0000 mg | ORAL_TABLET | ORAL | Status: DC
Start: 2014-01-29 — End: 2014-01-29
  Administered 2014-01-29: 150 mg via ORAL
  Filled 2014-01-29 (×2): qty 1

## 2014-01-29 MED ORDER — CITALOPRAM HYDROBROMIDE 20 MG PO TABS
20.0000 mg | ORAL_TABLET | ORAL | Status: DC
  Administered 2014-01-29 – 2014-02-05 (×8): 20 mg via ORAL
  Filled 2014-01-29 (×9): qty 1

## 2014-01-29 NOTE — Consult Note (Signed)
Name: Todd Villanueva MRN: [id number] DOB: 1941/02/11    ADMISSION DATE:  01/27/2014 CONSULTATION DATE:  01/28/2014  REFERRING MD :  [name] PRIMARY SERVICE:  Internal Medicine  CHIEF COMPLAINT:  SOB and cough  BRIEF PATIENT DESCRIPTION: 73 y.o. 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  [name] as outpatient, presents with worsening cough, wheezing, and SOB.  PCCM called for evaluation and assistance with medical care.  SIGNIFICANT EVENTS / STUDIES:  2/23 - admitted with SOB/cough, CXR suggestive of HCAP 2/23 ct chest>>>No embolus identified. Enlarging right upper paratracheal lymph node, 1.5 cm in short  axis, possibly reactive or malignant. Increasing airspace opacity at the inferior margin of the  remaining right upper lobe, nonspecific but potentially from radiation pneumonitis, pneumonia, or spread of tumor. 2/25- worsening distress  LINES / TUBES: PIV  CULTURES: MRSA 2/24 >>> negative  ANTIBIOTICS: Zosyn 2/23 x 1 Cefepime 2/24 >>> Vancomycin 2/23 >>>  SUBJECTIVE: no cp, but worsening distress  VITAL SIGNS: Temp:  [97.5 F (36.4 C)-98.3 F (36.8 C)] 97.7 F (36.5 C) (02/25 0755) Pulse Rate:  [83-105] 105 (02/25 0755) Resp:  [18-28] 18 (02/25 0755) BP: (120-152)/(73-102) 152/102 mmHg (02/25 0755) SpO2:  [89 %-98 %] 92 % (02/25 1048) Weight:  [79.1 kg (174 lb 6.1 oz)] 79.1 kg (174 lb 6.1 oz) (02/25 0434)  PHYSICAL EXAMINATION: General: Pleasant male, mild distress, hypoxia Neuro: A&O x 3, non-focal.  HEENT: [place]/AT. PERRL, sclerae anicteric. Cardiovascular: RRR, no M/R/G.  Lungs: Respirations labored, ronchi rt  Abdomen: BS x 4, soft, NT/ND.  Musculoskeletal: No gross deformities, no edema.  Skin: Intact, warm, no rashes.  PULMONARY  Recent Labs Lab 01/27/14 1812  TCO2 26    CBC  Recent Labs Lab 01/28/14 0510 01/28/14 2058 01/29/14 0246  HGB 8.1* 10.2* 10.5*  HCT 23.7* 29.2* 30.4*  WBC 8.8 13.4* 14.2*  PLT 124* 120*  131*    COAGULATION  Recent Labs Lab 01/27/14 1740  INR 0.96    CARDIAC    Recent Labs Lab 01/27/14 2035 01/28/14 0510 01/28/14 0950 01/28/14 2058 01/29/14 0246  TROPONINI 0.55* 0.66* 1.31* 1.55* 1.28*    Recent Labs Lab 01/27/14 1740  PROBNP 2835.0*     CHEMISTRY  Recent Labs Lab 01/27/14 1812 01/28/14 0510 01/29/14 0246  NA 133* 137 140  K 3.8 3.6* 3.7  CL 98 101 104  CO2  --  17* 21  GLUCOSE 104* 298* 131*  BUN 27* 26* 26*  CREATININE 1.30 1.26 1.52*  CALCIUM  --  8.0* 8.1*   Estimated Creatinine Clearance: 42.3 ml/min (by C-G formula based on Cr of 1.52).   LIVER  Recent Labs Lab 01/27/14 1740 01/28/14 0510  AST  --  25  ALT  --  26  ALKPHOS  --  156*  BILITOT  --  0.4  PROT  --  6.2  ALBUMIN  --  2.3*  INR 0.96  --      INFECTIOUS  Recent Labs Lab 01/27/14 1943 01/28/14 1340 01/29/14 0246  LATICACIDVEN 1.79  --   --   PROCALCITON  --  3.68 2.97     ENDOCRINE CBG (last 3)  No results found for this basename: GLUCAP,  in the last 72 hours       IMAGING x48h  Ct Angio Chest Pe W/cm &/or Wo Cm  01/27/2014   CLINICAL DATA:  Lung cancer, prior right middle and lower lobectomies. Shortness of breath. Cough. Chest pain.  EXAM: CT ANGIOGRAPHY CHEST WITH CONTRAST  TECHNIQUE: Multidetector CT imaging of the chest was performed using the standard protocol during bolus administration of intravenous contrast. Multiplanar CT image reconstructions and MIPs were obtained to evaluate the vascular anatomy.  CONTRAST:  140mL OMNIPAQUE IOHEXOL 350 MG/ML SOLN  COMPARISON:  DG CHEST 1V PORT dated 01/27/2014; CT CHEST W/CM dated 08/12/2013; CT CHEST W/CM dated 02/11/2013; DG CHEST 2 VIEW dated 12/16/2013  FINDINGS: The patient received 2 boluses of contrast. The first was a 50 cc bolus of Omnipaque 350 which triggered the pressure limit. Repeated exam with 80 cc of contrast performed with much better opacification of the vascular structures. Similar  truncated appearance of a right pulmonary artery due to the lobectomies. No visualized filling defects in the remaining right pulmonary arterial tree.  A mixed density appearance in the left lower lobe pulmonary arterial structures appears to correlate directly with readily observable motion artifact on the lung window images, and does not have a characteristic morphology for pulmonary embolus. Accordingly, there is abnormal hypodensity is attributed to artifact for example on image 165 of series 16.  Right upper paratracheal lymph node, 1.5 cm in short axis on image 18 of series 15 (formerly 1.1 cm). Left prevascular node 0.9 cm in short axis (formerly 0.7 cm).  Coronary atherosclerosis. Thick pleural enhancement on the right side with associated pleural fluid. Increase in interstitial and airspace opacity in the base of the right lung as on image 56 of series 17. Multiple right rib fractures or osteotomies, similar to prior. Prominent emphysema noted. 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  There is a new 90% compression fracture at T7 with 3 mm of posterior retropulsion contributing to central stenosis. . Benign compression fracture favored over pathologic fracture particularly in light of the relatively normal appearance of this vertebra on 08/12/2013.  Review of the MIP images confirms the above findings.  IMPRESSION: 1. No embolus identified. 2. Enlarging right upper paratracheal lymph node, 1.5 cm in short axis, possibly reactive or malignant. 3. Increasing airspace opacity at the inferior margin of the remaining right upper lobe, nonspecific but potentially from radiation pneumonitis, pneumonia, or spread of tumor. 4. Acute 90% compression fracture of the T7 vertebra with 3 mm of posterior retropulsion contributing to central stenosis. It is 5. Coronary atherosclerosis. 6. Thick pleural enhancement on the right side with right  pleural effusion, similar to prior. 7. Multiple right rib fractures are osteotomies, similar to prior. 8. Prominent emphysema. 9. Airway thickening along the right hilum with occlusion or near occlusion of the right tracheobronchial tree in this vicinity.   Electronically Signed   By: Sherryl Barters M.D.   On: 01/27/2014 21:10   Dg Chest Portable 1 View  01/27/2014   CLINICAL DATA:  Shortness breath.  History of lung cancer.  EXAM: PORTABLE CHEST - 1 VIEW  COMPARISON:  12/16/2013.  FINDINGS: Stable post therapy changes right lung including multiple remote right-sided rib fractures (some which are not completely healed). Mediastinal shift to the right. Limited for evaluating for recurrent tumor or detecting underlying infiltrate/ atelectasis/ pleural effusion given the postoperative changes.  Calcified mildly tortuous aorta.  Mild pulmonary vascular prominence without frank pulmonary edema.  IMPRESSION: Stable post therapy exam as noted above.   Electronically Signed   By: Chauncey Cruel M.D.   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 Titus Mould, MD, [place] Pgr: [facility]

## 2014-01-29 NOTE — Progress Notes (Signed)
RT Note- Patient placed on bipap due to increased work of breathing, nebulizer given at this time as well. Patient is tolerating Bipap well, cont to monitor.

## 2014-01-29 NOTE — Progress Notes (Signed)
Pm rounds  S Resting comfortably on NIPPV.  O BP 173/115  Pulse 108  Temp(Src) 97.4 F (36.3 C) (Axillary)  Resp 19  Ht 5\' 7"  (1.702 m)  Wt 79.1 kg (174 lb 6.1 oz)  BMI 27.31 kg/m2  SpO2 96%  Intake/Output Summary (Last 24 hours) at 01/29/14 2235 Last data filed at 01/29/14 2006  Gross per 24 hour  Intake    279 ml  Output   2250 ml  Net  -1971 ml   exam Gen: obese male, resting comfortably on NIPPV. HENT: BIPAP mask in place. Pulm: non-labored, no accessory muscle use, posterior rales, exp wheeze. Card: rrr EXT: no edema    Recent Labs Lab 01/27/14 1812 01/28/14 0510 01/29/14 0246  NA 133* 137 140  K 3.8 3.6* 3.7  CL 98 101 104  CO2  --  17* 21  BUN 27* 26* 26*  CREATININE 1.30 1.26 1.52*  GLUCOSE 104* 298* 131*    Recent Labs Lab 01/28/14 0510 01/28/14 2058 01/29/14 0246  HGB 8.1* 10.2* 10.5*  HCT 23.7* 29.2* 30.4*  WBC 8.8 13.4* 14.2*  PLT 124* 120* 131*  pPCXR:  Bilateral airspace disease. Right base post-op changed. Aeration worse on todays film   Acute on Chronic Respiratory Failure  In setting of diffuse bilateral airspace disease.  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  His clinical course has worsened since admit. But holding his own on BIPAP. He works here in the hospital. He absolutely refuses to transfer to the MICU. Feels like if he goes there he will die. I have tried to rationalize with him but he won't hear of it. He is willing to consider transfer to another unit IF and ONLY IF he requires mechanical ventilation. I have spent several minutes trying to convince him that the better option is the intensive care but he still requests to stay in the SDU. I am afraid that if we move him it will make him more anxious and possibly use up what little reserve he has left.   Plan:  - Budesonide from 0.25mg  BID to 0.5mg  BID.  - Scheduled  Levalbulterol/Ipratroprium q6h - Change PRN Levalbuterol from q6 to q3h prn  - Continue Solumedrol IV and increase with worsening clinical status  - Continue antibiotics and add levofloxacin for atypical coverage  - f/u Resp Virus PCR- awaited  - cont diuresis q 8 hrs - f/u chem in am - will check on him periodically thru night. Still think risk for intubation is high   Squamous Cell Lung Cancer:  - no evidence of recurrence on CT FEb 2015 compared to sept 2014  Marni Griffon ACNP-BC [place] Pager # [phone] OR # [phone] if no answer

## 2014-01-29 NOTE — Progress Notes (Signed)
eLink Physician-Brief Progress Note Patient Name: Todd Villanueva DOB: 08-Jul-1941 MRN: [id number]  Date of Service  01/29/2014   HPI/Events of Note   Case discussed with daytime rounding partner: COPD, lung cancer, acute respiratory failure of uncertain etiology, possibly HCAP Given diuretics, still not resting comfortably on BIPAP per bedside RN  eICU Interventions  Transfer to ICU Maintain current care   Intervention Category Major Interventions: Respiratory failure - evaluation and management  MCQUAID, DOUGLAS 01/29/2014, 8:41 PM

## 2014-01-29 NOTE — Progress Notes (Signed)
General Progress Note:  PCCM called to bedside for worsening respiratory status.  Pt complaining of difficulty breathing after getting up and going to the bathroom.  Also states he took his PNRB mask off to eat and got more SOB after eating.  ABG done at 12 noon only significant for a pO2 of 58.2  CXR done at that time showed pulmonary edema.  During exam pt on PNRB and SpO2 98% during entire interview/exam.  Physical Exam: Vitals: BP 154/99  Pulse 113  Temp(Src) 97.4 F (36.3 C) (Oral)  Resp 18  Ht 5\' 7"  (1.702 m)  Wt 174 lb 6.1 oz (79.1 kg)  BMI 27.31 kg/m2  SpO2 93% Heart:  RRR, no M/R/G. Lungs:  Scattered wheezing throughout, mod distress.  No paradoxical breathing, minor accessory muscle use.   A/P Acute Resp Failure - secondary to Pulm Edema  - Metolazone 5mg  PO then 30 - 45 min later, Lasix 40mg  IV - Nitro paste x 1 - BiPAP - Vitals q15 min x 2 hours - Please call if condition worsens.  Montey Hora, [facility] Pgr: [phone]  or [phone]  Went back and checked on patient, appears very comfortable with BiPAP, responded somewhat to lasix.  Will hold in SDU for now and will monitor.  CC time 35 min.  Patient seen and examined, agree with above note.  I dictated the care and orders written for this patient under my direction.  Rush Farmer, MD [phone]

## 2014-01-29 NOTE — Progress Notes (Signed)
ECHO EF 35% (difficult windows) BNP 2200  I have added lasix 40mg  IV BID with KDUR 63meq BID Creat 1.5 - monitor, K- 3.7  In addition to underlying lung/COPD, acute systolic heart failure also likely playing a role.

## 2014-01-29 NOTE — Progress Notes (Signed)
ANTIBIOTIC CONSULT NOTE - Follow Up  Pharmacy Consult for Vancomycin and Cefepime Indication: rule out pneumonia  Allergies  Allergen Reactions  . Hydrocodone-Acetaminophen     REACTION: Itch  . Oxycodone Itching  . Tramadol     Pt states will not take med b/c it does not work   . Zolpidem Tartrate Other (See Comments)    nightmares    Patient Measurements: Height: _0  (170.2 cm) Weight: 174 lb 6.1 oz (79.1 kg) IBW/kg (Calculated) : 66.1  Vital Signs: Temp: 97.9 F (36.6 C) (02/25 1615) Temp src: Oral (02/25 1615) BP: 180/109 mmHg (02/25 1615) Pulse Rate: 103 (02/25 1625) Intake/Output from previous day: 02/24 0701 - 02/25 0700 In: 1963 [P.O.:840; I.V.:273; Blood:650; IV Piggyback:200] Out: 2900 [Urine:2900] Intake/Output from this shift:    Labs:  Recent Labs  01/27/14 1812  01/28/14 0510 01/28/14 2058 01/29/14 0246  WBC  --   < > 8.8 13.4* 14.2*  HGB 9.5*  < > 8.1* 10.2* 10.5*  PLT  --   < > 124* 120* 131*  CREATININE 1.30  --  1.26  --  1.52*  < > = values in this interval not displayed. Estimated Creatinine Clearance: 42.3 ml/min (by C-G formula based on Cr of 1.52).  Recent Labs  01/29/14 1700  [place] 21.2*     Microbiology: Recent Results (from the past 720 hour(s))  MRSA PCR SCREENING     Status: None   Collection Time    01/28/14  1:24 AM      Result Value Ref Range Status   MRSA by PCR NEGATIVE  NEGATIVE Final   Comment:            The GeneXpert MRSA Assay (FDA     approved for NASAL specimens     only), is one component of a     comprehensive MRSA colonization     surveillance program. It is not     intended to diagnose MRSA     infection nor to guide or     monitor treatment for     MRSA infections.  RESPIRATORY VIRUS PANEL     Status: Abnormal   Collection Time    01/29/14  3:21 AM      Result Value Ref Range Status   Source - RVPAN NASAL SWAB   Corrected   Comment: CORRECTED ON 02/25 AT 1823: PREVIOUSLY REPORTED AS NASAL  SWAB   Respiratory Syncytial Virus A NOT DETECTED   Final   Respiratory Syncytial Virus B NOT DETECTED   Final   Influenza A DETECTED (*)  Final   Influenza B NOT DETECTED   Final   Parainfluenza 1 NOT DETECTED   Final   Parainfluenza 2 NOT DETECTED   Final   Parainfluenza 3 NOT DETECTED   Final   Metapneumovirus NOT DETECTED   Final   Rhinovirus NOT DETECTED   Final   Adenovirus NOT DETECTED   Final   Influenza A H1 NOT DETECTED   Final   Influenza A H3 DETECTED (*)  Final   Comment: (NOTE)           Normal Reference Range for each Analyte: NOT DETECTED     Testing performed using the Luminex xTAG Respiratory Viral Panel test     kit.     This test was developed and its performance characteristics determined     by [employer]. It has not been cleared or approved by the [place]  Food and Drug Administration. This test is used for clinical purposes.     It should not be regarded as investigational or for research. This     laboratory is certified under the [place] (CLIA) as qualified to perform high complexity     clinical laboratory testing.     Performed at [facility]: 73 yo male with SOB/cough, possible PNA, for empiric antibiotics  Vancomycin 1500 mg IV given in ED 2/23, then 793m IV q12 continued with VT 21 and Cr slight bump 1.2>1.5.  Wll not make changes in dose  tonight as trough is just slightly higher that goal.  If Cr continues to worsen then decrease vancomycin dose.  Goal of Therapy:  Vancomycin trough level 15-20 mcg/ml  Plan:  Continue Vancomycin 750 mg IV q12h Cefepime 1 g IV q12h  LBonnita NasutiPharm.D. CPP, BCPS Clinical Pharmacist [phone]/25/2015 7:50 PM

## 2014-01-29 NOTE — Progress Notes (Addendum)
       Patient Name: Todd Villanueva Date of Encounter: 01/29/2014    SUBJECTIVE:Nio chest pain or dyspnea.  TELEMETRY:  NSR Filed Vitals:   01/29/14 0300 01/29/14 0340 01/29/14 0434 01/29/14 0755  BP: 133/81  142/88 152/102  Pulse: 96  94 105  Temp:   98 F (36.7 C) 97.7 F (36.5 C)  TempSrc:   Oral Oral  Resp:    18  Height:      Weight:   174 lb 6.1 oz (79.1 kg)   SpO2: 95% 98% 98% 89%    Intake/Output Summary (Last 24 hours) at 01/29/14 0828 Last data filed at 01/29/14 6270  Gross per 24 hour  Intake   1950 ml  Output   2900 ml  Net   -950 ml    LABS: Basic Metabolic Panel:  Recent Labs  01/28/14 0510 01/29/14 0246  NA 137 140  K 3.6* 3.7  CL 101 104  CO2 17* 21  GLUCOSE 298* 131*  BUN 26* 26*  CREATININE 1.26 1.52*  CALCIUM 8.0* 8.1*   CBC:  Recent Labs  01/28/14 0510 01/28/14 2058 01/29/14 0246  WBC 8.8 13.4* 14.2*  NEUTROABS 8.5*  --   --   HGB 8.1* 10.2* 10.5*  HCT 23.7* 29.2* 30.4*  MCV 97.1 92.4 93.5  PLT 124* 120* 131*   Cardiac Enzymes:  Recent Labs  01/28/14 0950 01/28/14 2058 01/29/14 0246  TROPONINI 1.31* 1.55* 1.28*   BNP (last 3 results)  Recent Labs  01/27/14 1740  PROBNP 2835.0*   ECHO: - 01/28/14 Study Conclusions  - Left ventricle: The cavity size was normal. Systolic function was moderately reduced. The estimated ejection fraction was in the range of 35% to 40%. There is hypokinesis of the mid anteroseptal and apical myocardium. Very challenging study, evenwith Definty contrast. Doppler parameters are consistent with abnormal left ventricular relaxation (grade 1 diastolic dysfunction). - Left atrium: The atrium was mildly dilated.   Radiology/Studies:  CT on 01/27/14 IMPRESSION:  1. No embolus identified.  2. Enlarging right upper paratracheal lymph node, 1.5 cm in short  axis, possibly reactive or malignant.  3. Increasing airspace opacity at the inferior margin of the  remaining right upper lobe,  nonspecific but potentially from  radiation pneumonitis, pneumonia, or spread of tumor.  4. Acute 90% compression fracture of the T7 vertebra with 3 mm of  posterior retropulsion contributing to central stenosis. It is  5. Coronary atherosclerosis.  6. Thick pleural enhancement on the right side with right pleural  effusion, similar to prior.  7. Multiple right rib fractures are osteotomies, similar to prior.  8. Prominent emphysema.  9. 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   Physical Exam: Blood pressure 152/102, pulse 105, temperature 97.7 F (36.5 C), temperature source Oral, resp. rate 18, height 5\' 7"  (1.702 m), weight 174 lb 6.1 oz (79.1 kg), SpO2 89.00%. Weight change: -12 lb 9.9 oz (-5.723 kg)   Wheezing  ASSESSMENT:  1. 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 CAD with NSTEMI, type 1 vs 2  Plan:  Aggressive diuresis Add anti-ischemic therapy  Signed, Sinclair Grooms 01/29/2014, 8:28 AM

## 2014-01-29 NOTE — Progress Notes (Signed)
ANTICOAGULATION/ANTIBIOTIC CONSULT NOTE - Follow Up Consult  Pharmacy Consult for Heparin, Vancomycin/Cefepime Indication: NSTEMI, possible HCAP  Allergies  Allergen Reactions  . Hydrocodone-Acetaminophen     REACTION: Itch  . Oxycodone Itching  . Tramadol     Pt states will not take med b/c it does not work   . Zolpidem Tartrate Other (See Comments)    nightmares    Patient Measurements: Height: 5\' 7"  (170.2 cm) Weight: 174 lb 6.1 oz (79.1 kg) IBW/kg (Calculated) : 66.1 Heparin Dosing Weight: 79kg  Vital Signs: Temp: 97.7 F (36.5 C) (02/25 0755) Temp src: Oral (02/25 0755) BP: 152/102 mmHg (02/25 0755) Pulse Rate: 105 (02/25 0755)  Labs:  Recent Labs  01/27/14 1740 01/27/14 1812  01/28/14 0302 01/28/14 0510 01/28/14 0950 01/28/14 2058 01/29/14 0246  HGB 9.9* 9.5*  --  8.5* 8.1*  --  10.2* 10.5*  HCT 29.1* 28.0*  --  25.1* 23.7*  --  29.2* 30.4*  PLT 134*  --   --  121* 124*  --  120* 131*  LABPROT 12.6  --   --   --   --   --   --   --   INR 0.96  --   --   --   --   --   --   --   HEPARINUNFRC  --   --   --  0.48  --   --  0.47 0.62  CREATININE  --  1.30  --   --  1.26  --   --  1.52*  TROPONINI  --   --   < >  --  0.66* 1.31* 1.55* 1.28*  < > = values in this interval not displayed.  Estimated Creatinine Clearance: 42.3 ml/min (by C-G formula based on Cr of 1.52).   Medications:  Heparin @ 1300 units/hr  Assessment: 72yom continues on IV heparin for NSTEMI, PE ruled out. Heparin level is at goal. CBC stable s/p transfusion. No bleeding reported.  Also continues on day #2 vancomycin and cefepime for possible HCAP. Creatinine is trending up, UOP remains ok. Will need to adjust cefepime dose. Will check a vancomycin trough tonight to rule out accumulation.  Goal of Therapy:  Heparin level 0.3-0.7 units/ml Monitor platelets by anticoagulation protocol: Yes Vancomycin trough 15-20   Plan:  1) Continue heparin at 1300 units/hr 2) Change cefepime to 1g  IV q24 3) Continue vancomycin 750mg  IV q12 - check trogh tonight prior to 1800 dose  Deboraha Sprang 01/29/2014,9:50 AM

## 2014-01-29 NOTE — Progress Notes (Signed)
Subjective: Feeling better. Appreciates consultations from pulmonary critical care, cardiology, and oncology. Lasix added for decreased EF and increased BNP. Date had increased respiratory distress and placed on BiPAP and metolazone added to IV Lasix along with nitro paste  Objective: Weight change: -12 lb 9.9 oz (-5.723 kg)  Intake/Output Summary (Last 24 hours) at 01/29/14 0826 Last data filed at 01/29/14 7001  Gross per 24 hour  Intake   1950 ml  Output   2900 ml  Net   -950 ml   Filed Vitals:   01/29/14 0300 01/29/14 0340 01/29/14 0434 01/29/14 0755  BP: 133/81  142/88 152/102  Pulse: 96  94 105  Temp:   98 F (36.7 C) 97.7 F (36.5 C)  TempSrc:   Oral Oral  Resp:    18  Height:      Weight:   174 lb 6.1 oz (79.1 kg)   SpO2: 95% 98% 98% 89%   General: Well developed, well nourished, in mild respiratory distress  Head: Eyes PERRLA, No xanthomas. Normal cephalic and atramatic  Lungs: Diffuse wheezes bilaterally, decreased air movement bilaterally, increased respiratory rate with mild use of accessory musculature.  Heart: HRRR S1 S2 No JVD Extremities: No clubbing, cyanosis or edema. DP +1  Neuro: Alert and oriented X 3, non-focal, MAE x 4 Psych: Good affect, responds appropriately  Lab Results: Results for orders placed during the hospital encounter of 01/27/14 (from the past 48 hour(s))  CBC     Status: Abnormal   Collection Time    01/27/14  5:40 PM      Result Value Ref Range   WBC 12.9 (*) 4.0 - 10.5 K/uL   RBC 3.01 (*) 4.22 - 5.81 MIL/uL   Hemoglobin 9.9 (*) 13.0 - 17.0 g/dL   HCT 29.1 (*) 39.0 - 52.0 %   MCV 96.7  78.0 - 100.0 fL   MCH 32.9  26.0 - 34.0 pg   MCHC 34.0  30.0 - 36.0 g/dL   RDW 15.0  11.5 - 15.5 %   Platelets 134 (*) 150 - 400 K/uL  PRO B NATRIURETIC PEPTIDE     Status: Abnormal   Collection Time    01/27/14  5:40 PM      Result Value Ref Range   Pro B Natriuretic peptide (BNP) 2835.0 (*) 0 - 125 pg/mL  PROTIME-INR     Status: None    Collection Time    01/27/14  5:40 PM      Result Value Ref Range   Prothrombin Time 12.6  11.6 - 15.2 seconds   INR 0.96  0.00 - 1.49  I-STAT TROPOININ, ED     Status: Abnormal   Collection Time    01/27/14  6:11 PM      Result Value Ref Range   Troponin i, poc 0.42 (*) 0.00 - 0.08 ng/mL   Comment NOTIFIED PHYSICIAN     Comment 3            Comment: Due to the release kinetics of cTnI,     a negative result within the first hours     of the onset of symptoms does not rule out     myocardial infarction with certainty.     If myocardial infarction is still suspected,     repeat the test at appropriate intervals.  I-STAT CHEM 8, ED     Status: Abnormal   Collection Time    01/27/14  6:12 PM      Result Value  Ref Range   Sodium 133 (*) 137 - 147 mEq/L   Potassium 3.8  3.7 - 5.3 mEq/L   Chloride 98  96 - 112 mEq/L   BUN 27 (*) 6 - 23 mg/dL   Creatinine, Ser 1.30  0.50 - 1.35 mg/dL   Glucose, Bld 104 (*) 70 - 99 mg/dL   Calcium, Ion 1.14  1.13 - 1.30 mmol/L   TCO2 26  0 - 100 mmol/L   Hemoglobin 9.5 (*) 13.0 - 17.0 g/dL   HCT 28.0 (*) 39.0 - 52.0 %  I-STAT TROPOININ, ED     Status: Abnormal   Collection Time    01/27/14  7:40 PM      Result Value Ref Range   Troponin i, poc 0.45 (*) 0.00 - 0.08 ng/mL   Comment NOTIFIED PHYSICIAN     Comment 3            Comment: Due to the release kinetics of cTnI,     a negative result within the first hours     of the onset of symptoms does not rule out     myocardial infarction with certainty.     If myocardial infarction is still suspected,     repeat the test at appropriate intervals.  I-STAT CG4 LACTIC ACID, ED     Status: None   Collection Time    01/27/14  7:43 PM      Result Value Ref Range   Lactic Acid, Venous 1.79  0.5 - 2.2 mmol/L  TROPONIN I     Status: Abnormal   Collection Time    01/27/14  8:35 PM      Result Value Ref Range   Troponin I 0.55 (*) <0.30 ng/mL   Comment:            Due to the release kinetics of cTnI,      a negative result within the first hours     of the onset of symptoms does not rule out     myocardial infarction with certainty.     If myocardial infarction is still suspected,     repeat the test at appropriate intervals.     CRITICAL RESULT CALLED TO, READ BACK BY AND VERIFIED WITH:     HENSON H,RN 01/27/14 2143 WAYK  URINALYSIS, ROUTINE W REFLEX MICROSCOPIC     Status: Abnormal   Collection Time    01/27/14 10:04 PM      Result Value Ref Range   Color, Urine YELLOW  YELLOW   APPearance CLEAR  CLEAR   Specific Gravity, Urine >1.046 (*) 1.005 - 1.030   pH 5.5  5.0 - 8.0   Glucose, UA NEGATIVE  NEGATIVE mg/dL   Hgb urine dipstick NEGATIVE  NEGATIVE   Bilirubin Urine NEGATIVE  NEGATIVE   Ketones, ur 15 (*) NEGATIVE mg/dL   Protein, ur 100 (*) NEGATIVE mg/dL   Urobilinogen, UA 1.0  0.0 - 1.0 mg/dL   Nitrite NEGATIVE  NEGATIVE   Leukocytes, UA NEGATIVE  NEGATIVE  URINE MICROSCOPIC-ADD ON     Status: Abnormal   Collection Time    01/27/14 10:04 PM      Result Value Ref Range   WBC, UA 0-2  <3 WBC/hpf   Casts GRANULAR CAST (*) NEGATIVE  I-STAT TROPOININ, ED     Status: Abnormal   Collection Time    01/27/14 11:43 PM      Result Value Ref Range   Troponin i, poc 0.46 (*) 0.00 -  0.08 ng/mL   Comment NOTIFIED PHYSICIAN     Comment 3            Comment: Due to the release kinetics of cTnI,     a negative result within the first hours     of the onset of symptoms does not rule out     myocardial infarction with certainty.     If myocardial infarction is still suspected,     repeat the test at appropriate intervals.  MRSA PCR SCREENING     Status: None   Collection Time    01/28/14  1:24 AM      Result Value Ref Range   MRSA by PCR NEGATIVE  NEGATIVE   Comment:            The GeneXpert MRSA Assay (FDA     approved for NASAL specimens     only), is one component of a     comprehensive MRSA colonization     surveillance program. It is not     intended to diagnose MRSA      infection nor to guide or     monitor treatment for     MRSA infections.  HEPARIN LEVEL (UNFRACTIONATED)     Status: None   Collection Time    01/28/14  3:02 AM      Result Value Ref Range   Heparin Unfractionated 0.48  0.30 - 0.70 IU/mL   Comment:            IF HEPARIN RESULTS ARE BELOW     EXPECTED VALUES, AND PATIENT     DOSAGE HAS BEEN CONFIRMED,     SUGGEST FOLLOW UP TESTING     OF ANTITHROMBIN III LEVELS.  CBC     Status: Abnormal   Collection Time    01/28/14  3:02 AM      Result Value Ref Range   WBC 10.5  4.0 - 10.5 K/uL   RBC 2.57 (*) 4.22 - 5.81 MIL/uL   Hemoglobin 8.5 (*) 13.0 - 17.0 g/dL   HCT 25.1 (*) 39.0 - 52.0 %   MCV 97.7  78.0 - 100.0 fL   MCH 33.1  26.0 - 34.0 pg   MCHC 33.9  30.0 - 36.0 g/dL   RDW 15.4  11.5 - 15.5 %   Platelets 121 (*) 150 - 400 K/uL  TYPE AND SCREEN     Status: None   Collection Time    01/28/14  5:10 AM      Result Value Ref Range   ABO/RH(D) O POS     Antibody Screen NEG     Sample Expiration 01/31/2014     Unit Number [id number]     Blood Component Type RED CELLS,LR     Unit division 00     Status of Unit ISSUED     Transfusion Status OK TO TRANSFUSE     Crossmatch Result Compatible     Unit Number [id number]     Blood Component Type RBC LR PHER2     Unit division 00     Status of Unit ISSUED     Transfusion Status OK TO TRANSFUSE     Crossmatch Result Compatible    TROPONIN I     Status: Abnormal   Collection Time    01/28/14  5:10 AM      Result Value Ref Range   Troponin I 0.66 (*) <0.30 ng/mL   Comment:  Due to the release kinetics of cTnI,     a negative result within the first hours     of the onset of symptoms does not rule out     myocardial infarction with certainty.     If myocardial infarction is still suspected,     repeat the test at appropriate intervals.     CRITICAL VALUE NOTED.  VALUE IS CONSISTENT WITH PREVIOUSLY REPORTED AND CALLED VALUE.  COMPREHENSIVE METABOLIC PANEL      Status: Abnormal   Collection Time    01/28/14  5:10 AM      Result Value Ref Range   Sodium 137  137 - 147 mEq/L   Potassium 3.6 (*) 3.7 - 5.3 mEq/L   Chloride 101  96 - 112 mEq/L   CO2 17 (*) 19 - 32 mEq/L   Glucose, Bld 298 (*) 70 - 99 mg/dL   BUN 26 (*) 6 - 23 mg/dL   Creatinine, Ser 1.26  0.50 - 1.35 mg/dL   Calcium 8.0 (*) 8.4 - 10.5 mg/dL   Total Protein 6.2  6.0 - 8.3 g/dL   Albumin 2.3 (*) 3.5 - 5.2 g/dL   AST 25  0 - 37 U/L   ALT 26  0 - 53 U/L   Alkaline Phosphatase 156 (*) 39 - 117 U/L   Total Bilirubin 0.4  0.3 - 1.2 mg/dL   GFR calc non Af Amer 56 (*) >90 mL/min   GFR calc Af Amer 65 (*) >90 mL/min   Comment: (NOTE)     The eGFR has been calculated using the CKD EPI equation.     This calculation has not been validated in all clinical situations.     eGFR's persistently <90 mL/min signify possible Chronic Kidney     Disease.  CBC WITH DIFFERENTIAL     Status: Abnormal   Collection Time    01/28/14  5:10 AM      Result Value Ref Range   WBC 8.8  4.0 - 10.5 K/uL   RBC 2.44 (*) 4.22 - 5.81 MIL/uL   Hemoglobin 8.1 (*) 13.0 - 17.0 g/dL   HCT 23.7 (*) 39.0 - 52.0 %   MCV 97.1  78.0 - 100.0 fL   MCH 33.2  26.0 - 34.0 pg   MCHC 34.2  30.0 - 36.0 g/dL   RDW 15.3  11.5 - 15.5 %   Platelets 124 (*) 150 - 400 K/uL   Neutrophils Relative % 97 (*) 43 - 77 %   Neutro Abs 8.5 (*) 1.7 - 7.7 K/uL   Lymphocytes Relative 2 (*) 12 - 46 %   Lymphs Abs 0.1 (*) 0.7 - 4.0 K/uL   Monocytes Relative 2 (*) 3 - 12 %   Monocytes Absolute 0.1  0.1 - 1.0 K/uL   Eosinophils Relative 0  0 - 5 %   Eosinophils Absolute 0.0  0.0 - 0.7 K/uL   Basophils Relative 0  0 - 1 %   Basophils Absolute 0.0  0.0 - 0.1 K/uL  TSH     Status: Abnormal   Collection Time    01/28/14  5:10 AM      Result Value Ref Range   TSH 0.258 (*) 0.350 - 4.500 uIU/mL   Comment: Performed at [facility]  PREPARE RBC (CROSSMATCH)     Status: None   Collection Time    01/28/14  5:30 AM      Result Value  Ref Range   Order Confirmation  ORDER PROCESSED BY BLOOD BANK    TROPONIN I     Status: Abnormal   Collection Time    01/28/14  9:50 AM      Result Value Ref Range   Troponin I 1.31 (*) <0.30 ng/mL   Comment:            Due to the release kinetics of cTnI,     a negative result within the first hours     of the onset of symptoms does not rule out     myocardial infarction with certainty.     If myocardial infarction is still suspected,     repeat the test at appropriate intervals.     CRITICAL VALUE NOTED.  VALUE IS CONSISTENT WITH PREVIOUSLY REPORTED AND CALLED VALUE.  PROCALCITONIN     Status: None   Collection Time    01/28/14  1:40 PM      Result Value Ref Range   Procalcitonin 3.68     Comment:            Interpretation:     PCT > 2 ng/mL:     Systemic infection (sepsis) is likely,     unless other causes are known.     (NOTE)             ICU PCT Algorithm               Non ICU PCT Algorithm        ----------------------------     ------------------------------             PCT < 0.25 ng/mL                 PCT < 0.1 ng/mL         Stopping of antibiotics            Stopping of antibiotics           strongly encouraged.               strongly encouraged.        ----------------------------     ------------------------------           PCT level decrease by               PCT < 0.25 ng/mL           >= 80% from peak PCT           OR PCT 0.25 - 0.5 ng/mL          Stopping of antibiotics                                                 encouraged.         Stopping of antibiotics               encouraged.        ----------------------------     ------------------------------           PCT level decrease by              PCT >= 0.25 ng/mL           < 80% from peak PCT            AND PCT >= 0.5 ng/mL            Continuing antibiotics  encouraged.           Continuing antibiotics                encouraged.         ----------------------------     ------------------------------         PCT level increase compared          PCT > 0.5 ng/mL             with peak PCT AND              PCT >= 0.5 ng/mL             Escalation of antibiotics                                              strongly encouraged.          Escalation of antibiotics            strongly encouraged.  TROPONIN I     Status: Abnormal   Collection Time    01/28/14  8:58 PM      Result Value Ref Range   Troponin I 1.55 (*) <0.30 ng/mL   Comment:            Due to the release kinetics of cTnI,     a negative result within the first hours     of the onset of symptoms does not rule out     myocardial infarction with certainty.     If myocardial infarction is still suspected,     repeat the test at appropriate intervals.     CRITICAL VALUE NOTED.  VALUE IS CONSISTENT WITH PREVIOUSLY REPORTED AND CALLED VALUE.  CBC     Status: Abnormal   Collection Time    01/28/14  8:58 PM      Result Value Ref Range   WBC 13.4 (*) 4.0 - 10.5 K/uL   RBC 3.16 (*) 4.22 - 5.81 MIL/uL   Hemoglobin 10.2 (*) 13.0 - 17.0 g/dL   Comment: POST TRANSFUSION SPECIMEN   HCT 29.2 (*) 39.0 - 52.0 %   MCV 92.4  78.0 - 100.0 fL   MCH 32.3  26.0 - 34.0 pg   MCHC 34.9  30.0 - 36.0 g/dL   RDW 15.7 (*) 11.5 - 15.5 %   Platelets 120 (*) 150 - 400 K/uL  HEPARIN LEVEL (UNFRACTIONATED)     Status: None   Collection Time    01/28/14  8:58 PM      Result Value Ref Range   Heparin Unfractionated 0.47  0.30 - 0.70 IU/mL   Comment:            IF HEPARIN RESULTS ARE BELOW     EXPECTED VALUES, AND PATIENT     DOSAGE HAS BEEN CONFIRMED,     SUGGEST FOLLOW UP TESTING     OF ANTITHROMBIN III LEVELS.  HEPARIN LEVEL (UNFRACTIONATED)     Status: None   Collection Time    01/29/14  2:46 AM      Result Value Ref Range   Heparin Unfractionated 0.62  0.30 - 0.70 IU/mL   Comment:            IF HEPARIN RESULTS ARE BELOW     EXPECTED VALUES, AND PATIENT     DOSAGE HAS BEEN  CONFIRMED,  SUGGEST FOLLOW UP TESTING     OF ANTITHROMBIN III LEVELS.  CBC     Status: Abnormal   Collection Time    01/29/14  2:46 AM      Result Value Ref Range   WBC 14.2 (*) 4.0 - 10.5 K/uL   RBC 3.25 (*) 4.22 - 5.81 MIL/uL   Hemoglobin 10.5 (*) 13.0 - 17.0 g/dL   HCT 30.4 (*) 39.0 - 52.0 %   MCV 93.5  78.0 - 100.0 fL   MCH 32.3  26.0 - 34.0 pg   MCHC 34.5  30.0 - 36.0 g/dL   RDW 15.9 (*) 11.5 - 15.5 %   Platelets 131 (*) 150 - 400 K/uL  BASIC METABOLIC PANEL     Status: Abnormal   Collection Time    01/29/14  2:46 AM      Result Value Ref Range   Sodium 140  137 - 147 mEq/L   Potassium 3.7  3.7 - 5.3 mEq/L   Chloride 104  96 - 112 mEq/L   CO2 21  19 - 32 mEq/L   Glucose, Bld 131 (*) 70 - 99 mg/dL   BUN 26 (*) 6 - 23 mg/dL   Creatinine, Ser 1.52 (*) 0.50 - 1.35 mg/dL   Calcium 8.1 (*) 8.4 - 10.5 mg/dL   GFR calc non Af Amer 45 (*) >90 mL/min   GFR calc Af Amer 52 (*) >90 mL/min   Comment: (NOTE)     The eGFR has been calculated using the CKD EPI equation.     This calculation has not been validated in all clinical situations.     eGFR's persistently <90 mL/min signify possible Chronic Kidney     Disease.  TROPONIN I     Status: Abnormal   Collection Time    01/29/14  2:46 AM      Result Value Ref Range   Troponin I 1.28 (*) <0.30 ng/mL   Comment:            Due to the release kinetics of cTnI,     a negative result within the first hours     of the onset of symptoms does not rule out     myocardial infarction with certainty.     If myocardial infarction is still suspected,     repeat the test at appropriate intervals.     CRITICAL VALUE NOTED.  VALUE IS CONSISTENT WITH PREVIOUSLY REPORTED AND CALLED VALUE.  PROCALCITONIN     Status: None   Collection Time    01/29/14  2:46 AM      Result Value Ref Range   Procalcitonin 2.97     Comment:            Interpretation:     PCT > 2 ng/mL:     Systemic infection (sepsis) is likely,     unless other causes are  known.     (NOTE)             ICU PCT Algorithm               Non ICU PCT Algorithm        ----------------------------     ------------------------------             PCT < 0.25 ng/mL                 PCT < 0.1 ng/mL         Stopping of antibiotics  Stopping of antibiotics           strongly encouraged.               strongly encouraged.        ----------------------------     ------------------------------           PCT level decrease by               PCT < 0.25 ng/mL           >= 80% from peak PCT           OR PCT 0.25 - 0.5 ng/mL          Stopping of antibiotics                                                 encouraged.         Stopping of antibiotics               encouraged.        ----------------------------     ------------------------------           PCT level decrease by              PCT >= 0.25 ng/mL           < 80% from peak PCT            AND PCT >= 0.5 ng/mL            Continuing antibiotics                                                  encouraged.           Continuing antibiotics                encouraged.        ----------------------------     ------------------------------         PCT level increase compared          PCT > 0.5 ng/mL             with peak PCT AND              PCT >= 0.5 ng/mL             Escalation of antibiotics                                              strongly encouraged.          Escalation of antibiotics            strongly encouraged.    Studies/Results: Ct Angio Chest Pe W/cm &/or Wo Cm  01/27/2014   CLINICAL DATA:  Lung cancer, prior right middle and lower lobectomies. Shortness of breath. Cough. Chest pain.  EXAM: CT ANGIOGRAPHY CHEST WITH CONTRAST  TECHNIQUE: Multidetector CT imaging of the chest was performed using the standard protocol during bolus administration of intravenous contrast. Multiplanar CT image reconstructions and MIPs were obtained to evaluate the vascular anatomy.  CONTRAST:  133m OMNIPAQUE IOHEXOL 350  MG/ML  SOLN  COMPARISON:  DG CHEST 1V PORT dated 01/27/2014; CT CHEST W/CM dated 08/12/2013; CT CHEST W/CM dated 02/11/2013; DG CHEST 2 VIEW dated 12/16/2013  FINDINGS: The patient received 2 boluses of contrast. The first was a 50 cc bolus of Omnipaque 350 which triggered the pressure limit. Repeated exam with 80 cc of contrast performed with much better opacification of the vascular structures. Similar truncated appearance of a right pulmonary artery due to the lobectomies. No visualized filling defects in the remaining right pulmonary arterial tree.  A mixed density appearance in the left lower lobe pulmonary arterial structures appears to correlate directly with readily observable motion artifact on the lung window images, and does not have a characteristic morphology for pulmonary embolus. Accordingly, there is abnormal hypodensity is attributed to artifact for example on image 165 of series 16.  Right upper paratracheal lymph node, 1.5 cm in short axis on image 18 of series 15 (formerly 1.1 cm). Left prevascular node 0.9 cm in short axis (formerly 0.7 cm).  Coronary atherosclerosis. Thick pleural enhancement on the right side with associated pleural fluid. Increase in interstitial and airspace opacity in the base of the right lung as on image 56 of series 17. Multiple right rib fractures or osteotomies, similar to prior. Prominent emphysema noted. 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  There is a new 90% compression fracture at T7 with 3 mm of posterior retropulsion contributing to central stenosis. . Benign compression fracture favored over pathologic fracture particularly in light of the relatively normal appearance of this vertebra on 08/12/2013.  Review of the MIP images confirms the above findings.  IMPRESSION: 1. No embolus identified. 2. Enlarging right upper paratracheal lymph node, 1.5 cm in short axis, possibly reactive or  malignant. 3. Increasing airspace opacity at the inferior margin of the remaining right upper lobe, nonspecific but potentially from radiation pneumonitis, pneumonia, or spread of tumor. 4. Acute 90% compression fracture of the T7 vertebra with 3 mm of posterior retropulsion contributing to central stenosis. It is 5. Coronary atherosclerosis. 6. Thick pleural enhancement on the right side with right pleural effusion, similar to prior. 7. Multiple right rib fractures are osteotomies, similar to prior. 8. Prominent emphysema. 9. Airway thickening along the right hilum with occlusion or near occlusion of the right tracheobronchial tree in this vicinity.   Electronically Signed   By: Sherryl Barters M.D.   On: 01/27/2014 21:10   Dg Chest Portable 1 View  01/27/2014   CLINICAL DATA:  Shortness breath.  History of lung cancer.  EXAM: PORTABLE CHEST - 1 VIEW  COMPARISON:  12/16/2013.  FINDINGS: Stable post therapy changes right lung including multiple remote right-sided rib fractures (some which are not completely healed). Mediastinal shift to the right. Limited for evaluating for recurrent tumor or detecting underlying infiltrate/ atelectasis/ pleural effusion given the postoperative changes.  Calcified mildly tortuous aorta.  Mild pulmonary vascular prominence without frank pulmonary edema.  IMPRESSION: Stable post therapy exam as noted above.   Electronically Signed   By: Chauncey Cruel M.D.   On: 01/27/2014 18:31   Medications: Scheduled Meds: . ALPRAZolam  0.25 mg Oral TID  . aspirin EC  325 mg Oral Q24H  . budesonide (PULMICORT) nebulizer solution  0.5 mg Nebulization BID  . ceFEPime (MAXIPIME) IV  1 g Intravenous Q12H  . citalopram  20 mg Oral Q24H  . ferrous [id number] C-folic acid  1 capsule Oral Q24H  .  furosemide  40 mg Intravenous BID  . ipratropium  0.5 mg Nebulization Q4H  . irbesartan  150 mg Oral Q24H  . levalbuterol  0.63 mg Nebulization Q4H  . methylPREDNISolone (SOLU-MEDROL)  injection  40 mg Intravenous Daily  . pantoprazole  40 mg Oral Q24H  . potassium chloride  40 mEq Oral BID  . simvastatin  40 mg Oral Once per day on Mon Wed Fri  . sodium chloride  3 mL Intravenous Q12H  . sodium chloride  3 mL Intravenous Q12H  . vancomycin  750 mg Intravenous Q12H   Continuous Infusions: . heparin 1,300 Units/hr (01/28/14 1310)   PRN Meds:.acetaminophen, acetaminophen, temazepam  Assessment/Plan:  Principal Problem:  Acute respiratory failure - Respiratory status has worsened as the day has gone on. Diuresis has increased. Ejection fraction much lower than anticipated at an echocardiogram. Pulmonary critical care has an increase diuresis and BiPAP. Condition quite guarded Active Problems:  COPD with emphysema - as above  CARCINOMA, LUNG, SQUAMOUS CELL - after treatment for acute respiratory failure, Dr. Julien Nordmann would like to see Mr. Seddon back in his office for PET scan etc. To see if there is any recurrence of disease OSA (obstructive sleep apnea) - does not tolerate CPAP well but placed on this evening for respiratory distress  HCAP (healthcare-associated pneumonia) - possible pneumonia. Continue Maxipime  T7 vertebral fracture - could be from metastatic cause or from severe coughing  Non-ST elevation MI (NSTEMI) - 3 positive troponins. Contacted Dr. Tamela Gammon who will arrange consultation. Continue IV heparin.  Anemia - keep hemoglobin above 9 secondary to possible coronary ischemia. Denies blood in stools. Could be related to bone marrow process. Dr. Earlie Server to consult. Will Hemoccult stools  Hypertension - controlled  Anxiety - Xanax 0.25 mg 3 times a day and every 6 hours when necessary  Hyperlipidemia - continue statin therapy   LOS: 2 days   [name], MD 01/29/2014, 8:26 AM

## 2014-01-29 NOTE — Telephone Encounter (Signed)
lvm for pt regarding to March appt cx and moved to April per MD request....mailed pt appt scched/avs and letter

## 2014-01-30 ENCOUNTER — Inpatient Hospital Stay (HOSPITAL_COMMUNITY): Payer: 59

## 2014-01-30 ENCOUNTER — Encounter (INDEPENDENT_AMBULATORY_CARE_PROVIDER_SITE_OTHER): Payer: 59 | Admitting: Ophthalmology

## 2014-01-30 LAB — CBC
HEMATOCRIT: 34 % — AB (ref 39.0–52.0)
Hemoglobin: 11.9 g/dL — ABNORMAL LOW (ref 13.0–17.0)
MCH: 32.2 pg (ref 26.0–34.0)
MCHC: 35 g/dL (ref 30.0–36.0)
MCV: 92.1 fL (ref 78.0–100.0)
Platelets: 158 10*3/uL (ref 150–400)
RBC: 3.69 MIL/uL — ABNORMAL LOW (ref 4.22–5.81)
RDW: 15.6 % — ABNORMAL HIGH (ref 11.5–15.5)
WBC: 16.3 10*3/uL — ABNORMAL HIGH (ref 4.0–10.5)

## 2014-01-30 LAB — BASIC METABOLIC PANEL
BUN: 34 mg/dL — ABNORMAL HIGH (ref 6–23)
CALCIUM: 8.2 mg/dL — AB (ref 8.4–10.5)
CO2: 20 mEq/L (ref 19–32)
CREATININE: 1.59 mg/dL — AB (ref 0.50–1.35)
Chloride: 84 mEq/L — ABNORMAL LOW (ref 96–112)
GFR calc non Af Amer: 42 mL/min — ABNORMAL LOW (ref >90)
GFR, EST AFRICAN AMERICAN: 49 mL/min — AB (ref >90)
Glucose, Bld: 174 mg/dL — ABNORMAL HIGH (ref 70–99)
Potassium: 3.7 mEq/L (ref 3.7–5.3)
SODIUM: 146 meq/L (ref 137–147)

## 2014-01-30 LAB — PROCALCITONIN: Procalcitonin: 1.44 ng/mL

## 2014-01-30 LAB — HEPARIN LEVEL (UNFRACTIONATED): HEPARIN UNFRACTIONATED: 0.72 [IU]/mL — AB (ref 0.30–0.70)

## 2014-01-30 MED ORDER — FUROSEMIDE 10 MG/ML IJ SOLN
40.0000 mg | Freq: Two times a day (BID) | INTRAMUSCULAR | Status: DC
  Administered 2014-01-30: 40 mg via INTRAVENOUS
  Filled 2014-01-30 (×2): qty 4

## 2014-01-30 MED ORDER — HEPARIN (PORCINE) IN NACL 100-0.45 UNIT/ML-% IJ SOLN
1300.0000 [IU]/h | INTRAMUSCULAR | Status: DC
  Administered 2014-01-30 – 2014-02-01 (×2): 1200 [IU]/h via INTRAVENOUS
  Filled 2014-01-30 (×6): qty 250

## 2014-01-30 NOTE — Progress Notes (Signed)
Name: Todd Villanueva MRN: [id number] DOB: Feb 25, 1941    ADMISSION DATE:  01/27/2014 CONSULTATION DATE:  01/28/2014  REFERRING MD :  [name] PRIMARY SERVICE:  Internal Medicine  CHIEF COMPLAINT:  SOB and cough  BRIEF PATIENT DESCRIPTION: 73 y.o. 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  [name] as outpatient, presents with worsening cough, wheezing, and SOB.  PCCM called for evaluation and assistance with medical care.  SIGNIFICANT EVENTS / STUDIES:  2/23 - admitted with SOB/cough, CXR suggestive of HCAP 2/23 ct chest>>>No embolus identified. Enlarging right upper paratracheal lymph node, 1.5 cm in short  axis, possibly reactive or malignant. Increasing airspace opacity at the inferior margin of the  remaining right upper lobe, nonspecific but potentially from radiation pneumonitis, pneumonia, or spread of tumor. 2/25- worsening distress  LINES / TUBES: PIV  CULTURES: MRSA 2/24 >>> negative  ANTIBIOTICS: Zosyn 2/23 x 1 Cefepime 2/24 >>> Vancomycin 2/23 >>>  SUBJECTIVE:  Events of last night noted > resp distress requiring BiPAP Abx broadened Diuresed.  He did not want to move to the ICU  VITAL SIGNS: Temp:  [96.7 F (35.9 C)-97.9 F (36.6 C)] 96.7 F (35.9 C) (02/26 1144) Pulse Rate:  [94-113] 108 (02/26 1144) Resp:  [14-27] 16 (02/26 1144) BP: (126-180)/(86-116) 149/99 mmHg (02/26 1144) SpO2:  [92 %-100 %] 97 % (02/26 1144) FiO2 (%):  [60 %-80 %] 60 % (02/26 0753) Weight:  [79.1 kg (174 lb 6.1 oz)] 79.1 kg (174 lb 6.1 oz) (02/26 0340)  PHYSICAL EXAMINATION: General: Pleasant male,  Neuro: A&O x 3, non-focal.  HEENT: [place]/AT. PERRL, sclerae anicteric. Cardiovascular: RRR, no M/R/G.  Lungs: Respirations labored, ronchi rt  Abdomen: BS x 4, soft, NT/ND.  Musculoskeletal: No gross deformities, no edema.  Skin: Intact, warm, no rashes.  PULMONARY  Recent Labs Lab 01/27/14 1812 01/29/14 1200  PHART  --  7.386  PCO2ART  --  36.9   PO2ART  --  58.2*  HCO3  --  21.6  TCO2 26 22.8  O2SAT  --  88.6    CBC  Recent Labs Lab 01/28/14 2058 01/29/14 0246 01/30/14 0253  HGB 10.2* 10.5* 11.9*  HCT 29.2* 30.4* 34.0*  WBC 13.4* 14.2* 16.3*  PLT 120* 131* 158    COAGULATION  Recent Labs Lab 01/27/14 1740  INR 0.96    CARDIAC    Recent Labs Lab 01/27/14 2035 01/28/14 0510 01/28/14 0950 01/28/14 2058 01/29/14 0246  TROPONINI 0.55* 0.66* 1.31* 1.55* 1.28*    Recent Labs Lab 01/27/14 1740  PROBNP 2835.0*     CHEMISTRY  Recent Labs Lab 01/27/14 1812 01/28/14 0510 01/29/14 0246 01/30/14 0935  NA 133* 137 140 146  K 3.8 3.6* 3.7 3.7  CL 98 101 104 84*  CO2  --  17* 21 20  GLUCOSE 104* 298* 131* 174*  BUN 27* 26* 26* 34*  CREATININE 1.30 1.26 1.52* 1.59*  CALCIUM  --  8.0* 8.1* 8.2*   Estimated Creatinine Clearance: 40.4 ml/min (by C-G formula based on Cr of 1.59).   LIVER  Recent Labs Lab 01/27/14 1740 01/28/14 0510  AST  --  25  ALT  --  26  ALKPHOS  --  156*  BILITOT  --  0.4  PROT  --  6.2  ALBUMIN  --  2.3*  INR 0.96  --      INFECTIOUS  Recent Labs Lab 01/27/14 1943 01/28/14 1340 01/29/14 0246 01/30/14 0253  LATICACIDVEN 1.79  --   --   --  PROCALCITON  --  3.68 2.97 1.44     ENDOCRINE CBG (last 3)  No results found for this basename: GLUCAP,  in the last 72 hours   IMAGING x48h  Dg Chest Port 1 View  01/30/2014   CLINICAL DATA:  Short of breath  EXAM: PORTABLE CHEST - 1 VIEW  COMPARISON:  DG CHEST 1V PORT dated 01/29/2014; CT ANGIO CHEST W/CM &/OR WO/CM dated 01/27/2014  FINDINGS: There is continued volume loss in the right hemi thorax with dense opacity at the right base. Diffuse airspace disease throughout the left lung has increased. Mild airspace disease in the right lung is stable. No pneumothorax.  IMPRESSION: Increasing airspace disease throughout the left lung. Otherwise stable exam.   Electronically Signed   By: Maryclare Bean M.D.   On: 01/30/2014  07:42   Dg Chest Port 1 View  01/29/2014   CLINICAL DATA:  assess infiltate  EXAM: PORTABLE CHEST - 1 VIEW  COMPARISON:  CT ANGIO CHEST W/CM &/OR WO/CM dated 01/27/2014; DG CHEST 1V PORT dated 01/27/2014  FINDINGS: The cardiac silhouette is obscured secondary to the right lower lobe density. There has been interval development of diffuse prominence of the interstitial markings, peribronchial cuffing, indistinctness of the pulmonary vasculature. Low lung volumes. Persistent consolidative density projects in the right lung base. Remote rib fractures are again appreciated within the right hemithorax.  IMPRESSION: Interstitial infiltrate consistent with pulmonary edema. There is persistent consolidative density in the right lung base a component of which likely represents a small pleural effusion.   Electronically Signed   By: Margaree Mackintosh M.D.   On: 01/29/2014 12:08   01/27/14 --  COMPARISON: DG CHEST 1V PORT dated 01/27/2014; CT CHEST W/CM dated  08/12/2013; CT CHEST W/CM dated 02/11/2013; DG CHEST 2 VIEW dated  12/16/2013  FINDINGS:  The patient received 2 boluses of contrast. The first was a 50 cc  bolus of Omnipaque 350 which triggered the pressure limit. Repeated  exam with 80 cc of contrast performed with much better opacification  of the vascular structures. Similar truncated appearance of a right  pulmonary artery due to the lobectomies. No visualized filling  defects in the remaining right pulmonary arterial tree.  A mixed density appearance in the left lower lobe pulmonary arterial  structures appears to correlate directly with readily observable  motion artifact on the lung window images, and does not have a  characteristic morphology for pulmonary embolus. Accordingly, there  is abnormal hypodensity is attributed to artifact for example on  image 165 of series 16.  Right upper paratracheal lymph node, 1.5 cm in short axis on image  18 of series 15 (formerly 1.1 cm). Left prevascular node  0.9 cm in  short axis (formerly 0.7 cm).  Coronary atherosclerosis. Thick pleural enhancement on the right  side with associated pleural fluid. Increase in interstitial and  airspace opacity in the base of the right lung as on image 56 of  series 17. Multiple right rib fractures or osteotomies, similar to  prior. Prominent emphysema noted. The remaining right  tracheobronchial tree is occluded or nearly occluded centrally on  images 42 through 47 of series 17.  There is a new 90% compression fracture at T7 with 3 mm of posterior  retropulsion contributing to central stenosis. . Benign compression  fracture favored over pathologic fracture particularly in light of  the relatively normal appearance of this vertebra on 08/12/2013.  Review of the MIP images confirms the above findings.  IMPRESSION:  1. No embolus identified.  2. Enlarging right upper paratracheal lymph node, 1.5 cm in short  axis, possibly reactive or malignant.  3. Increasing airspace opacity at the inferior margin of the  remaining right upper lobe, nonspecific but potentially from  radiation pneumonitis, pneumonia, or spread of tumor.  4. Acute 90% compression fracture of the T7 vertebra with 3 mm of  posterior retropulsion contributing to central stenosis. It is  5. Coronary atherosclerosis.  6. Thick pleural enhancement on the right side with right pleural  effusion, similar to prior.  7. Multiple right rib fractures are osteotomies, similar to prior.  8. Prominent emphysema.  9. 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   Plan: - Budesonide 0.5mg  BID. - Scheduled Levalbulterol/Ipratroprium q4h, caution increase with ischemia - PRN Levalbuterol from q6 to q3h prn - Continue Solumedrol IV and increase with worsening clinical status - Continue antibiotics including levofloxacin for atypical coverage - follow cx data - consider further diuresis today as worsening status - BiPAP prn - may benefit from FOB at some point AFTER he stabilizes to assess for airway collapse or recurrence malignancy. Will discuss this with Dr [name] before proceeding   Baltazar Apo, MD, PhD 01/30/2014, 11:56 AM [facility] [phone] or if no answer [phone]

## 2014-01-30 NOTE — Progress Notes (Signed)
Respiratory therapy d/c Bipap and placed pt on 100 % non-rebreather. 02 sats running 96 to 99 % Has some wheezes but says he feels much better today.

## 2014-01-30 NOTE — Progress Notes (Signed)
Pt given breathing treatment & placed on 6 l n/c sats remain between 91 to 93 %

## 2014-01-30 NOTE — Progress Notes (Addendum)
Agree with asssessment and plan as outlined. Exam is confirmed. Clinical data reviewed. Still needs diuresis but lower dose lasix may be effective and not further bump kidney numbers.

## 2014-01-30 NOTE — Progress Notes (Signed)
Subjective: Patient had a rough night last night with hypoxemia and is breathing much better today after intense diuresis as well as upping medications for bronchospasm and treated with BiPAP. Patient refused transfer to the ICU for fear of "dying there". Fortunately, much better today after treatment with increased Lasix and addition of metolazone and with increasing steroids. Respiratory failure is likely multifactorial with contribution from CHF, COPD, pneumonia, and possibly recurrence of  lung carcinoma as well. CT angiogram on admission revealed no pulmonary emboli but did reveal marked narrowing of the right tracheobronchial tree. Suspect that bronchoscopy when more clinically stable would likely very helpful to determine etiology of the narrowing and to perhaps treat that area more definitively  Objective: Weight change: 0 lb (0 kg)  Intake/Output Summary (Last 24 hours) at 01/30/14 0839 Last data filed at 01/30/14 5093  Gross per 24 hour  Intake    642 ml  Output   4550 ml  Net  -3908 ml   Filed Vitals:   01/30/14 0400 01/30/14 0753 01/30/14 0757 01/30/14 0820  BP: 147/97   150/106  Pulse: 101  101 109  Temp: 97 F (36.1 C)   97.6 F (36.4 C)  TempSrc: Axillary   Axillary  Resp:   14 16  Height:      Weight:      SpO2: 98% 98% 98% 98%    General Appearance: Alert, cooperative, no distress, appears stated age Lungs: Distant breath sounds throughout, expiratory wheezes improved Heart: Regular rate and rhythm, S1 and S2 normal, no murmur, rub or gallop Abdomen: Soft, non-tender, bowel sounds active all four quadrants, no masses, no organomegaly Extremities: Extremities normal, atraumatic, no cyanosis or edema Neuro: Oriented x3, nonfocal, moves all extremities well, no dysarthria  Lab Results: Results for orders placed during the hospital encounter of 01/27/14 (from the past 48 hour(s))  TROPONIN I     Status: Abnormal   Collection Time    01/28/14  9:50 AM      Result  Value Ref Range   Troponin I 1.31 (*) <0.30 ng/mL   Comment:            Due to the release kinetics of cTnI,     a negative result within the first hours     of the onset of symptoms does not rule out     myocardial infarction with certainty.     If myocardial infarction is still suspected,     repeat the test at appropriate intervals.     CRITICAL VALUE NOTED.  VALUE IS CONSISTENT WITH PREVIOUSLY REPORTED AND CALLED VALUE.  PROCALCITONIN     Status: None   Collection Time    01/28/14  1:40 PM      Result Value Ref Range   Procalcitonin 3.68     Comment:            Interpretation:     PCT > 2 ng/mL:     Systemic infection (sepsis) is likely,     unless other causes are known.     (NOTE)             ICU PCT Algorithm               Non ICU PCT Algorithm        ----------------------------     ------------------------------             PCT < 0.25 ng/mL  PCT < 0.1 ng/mL         Stopping of antibiotics            Stopping of antibiotics           strongly encouraged.               strongly encouraged.        ----------------------------     ------------------------------           PCT level decrease by               PCT < 0.25 ng/mL           >= 80% from peak PCT           OR PCT 0.25 - 0.5 ng/mL          Stopping of antibiotics                                                 encouraged.         Stopping of antibiotics               encouraged.        ----------------------------     ------------------------------           PCT level decrease by              PCT >= 0.25 ng/mL           < 80% from peak PCT            AND PCT >= 0.5 ng/mL            Continuing antibiotics                                                  encouraged.           Continuing antibiotics                encouraged.        ----------------------------     ------------------------------         PCT level increase compared          PCT > 0.5 ng/mL             with peak PCT AND              PCT  >= 0.5 ng/mL             Escalation of antibiotics                                              strongly encouraged.          Escalation of antibiotics            strongly encouraged.  TROPONIN I     Status: Abnormal   Collection Time    01/28/14  8:58 PM      Result Value Ref Range   Troponin I 1.55 (*) <0.30 ng/mL   Comment:            Due to the release  kinetics of cTnI,     a negative result within the first hours     of the onset of symptoms does not rule out     myocardial infarction with certainty.     If myocardial infarction is still suspected,     repeat the test at appropriate intervals.     CRITICAL VALUE NOTED.  VALUE IS CONSISTENT WITH PREVIOUSLY REPORTED AND CALLED VALUE.  CBC     Status: Abnormal   Collection Time    01/28/14  8:58 PM      Result Value Ref Range   WBC 13.4 (*) 4.0 - 10.5 K/uL   RBC 3.16 (*) 4.22 - 5.81 MIL/uL   Hemoglobin 10.2 (*) 13.0 - 17.0 g/dL   Comment: POST TRANSFUSION SPECIMEN   HCT 29.2 (*) 39.0 - 52.0 %   MCV 92.4  78.0 - 100.0 fL   MCH 32.3  26.0 - 34.0 pg   MCHC 34.9  30.0 - 36.0 g/dL   RDW 15.7 (*) 11.5 - 15.5 %   Platelets 120 (*) 150 - 400 K/uL  HEPARIN LEVEL (UNFRACTIONATED)     Status: None   Collection Time    01/28/14  8:58 PM      Result Value Ref Range   Heparin Unfractionated 0.47  0.30 - 0.70 IU/mL   Comment:            IF HEPARIN RESULTS ARE BELOW     EXPECTED VALUES, AND PATIENT     DOSAGE HAS BEEN CONFIRMED,     SUGGEST FOLLOW UP TESTING     OF ANTITHROMBIN III LEVELS.  HEPARIN LEVEL (UNFRACTIONATED)     Status: None   Collection Time    01/29/14  2:46 AM      Result Value Ref Range   Heparin Unfractionated 0.62  0.30 - 0.70 IU/mL   Comment:            IF HEPARIN RESULTS ARE BELOW     EXPECTED VALUES, AND PATIENT     DOSAGE HAS BEEN CONFIRMED,     SUGGEST FOLLOW UP TESTING     OF ANTITHROMBIN III LEVELS.  CBC     Status: Abnormal   Collection Time    01/29/14  2:46 AM      Result Value Ref Range   WBC  14.2 (*) 4.0 - 10.5 K/uL   RBC 3.25 (*) 4.22 - 5.81 MIL/uL   Hemoglobin 10.5 (*) 13.0 - 17.0 g/dL   HCT 30.4 (*) 39.0 - 52.0 %   MCV 93.5  78.0 - 100.0 fL   MCH 32.3  26.0 - 34.0 pg   MCHC 34.5  30.0 - 36.0 g/dL   RDW 15.9 (*) 11.5 - 15.5 %   Platelets 131 (*) 150 - 400 K/uL  BASIC METABOLIC PANEL     Status: Abnormal   Collection Time    01/29/14  2:46 AM      Result Value Ref Range   Sodium 140  137 - 147 mEq/L   Potassium 3.7  3.7 - 5.3 mEq/L   Chloride 104  96 - 112 mEq/L   CO2 21  19 - 32 mEq/L   Glucose, Bld 131 (*) 70 - 99 mg/dL   BUN 26 (*) 6 - 23 mg/dL   Creatinine, Ser 1.52 (*) 0.50 - 1.35 mg/dL   Calcium 8.1 (*) 8.4 - 10.5 mg/dL   GFR calc non Af Amer 45 (*) >90 mL/min   GFR calc Af Wyvonnia Lora  52 (*) >90 mL/min   Comment: (NOTE)     The eGFR has been calculated using the CKD EPI equation.     This calculation has not been validated in all clinical situations.     eGFR's persistently <90 mL/min signify possible Chronic Kidney     Disease.  TROPONIN I     Status: Abnormal   Collection Time    01/29/14  2:46 AM      Result Value Ref Range   Troponin I 1.28 (*) <0.30 ng/mL   Comment:            Due to the release kinetics of cTnI,     a negative result within the first hours     of the onset of symptoms does not rule out     myocardial infarction with certainty.     If myocardial infarction is still suspected,     repeat the test at appropriate intervals.     CRITICAL VALUE NOTED.  VALUE IS CONSISTENT WITH PREVIOUSLY REPORTED AND CALLED VALUE.  PROCALCITONIN     Status: None   Collection Time    01/29/14  2:46 AM      Result Value Ref Range   Procalcitonin 2.97     Comment:            Interpretation:     PCT > 2 ng/mL:     Systemic infection (sepsis) is likely,     unless other causes are known.     (NOTE)             ICU PCT Algorithm               Non ICU PCT Algorithm        ----------------------------     ------------------------------             PCT <  0.25 ng/mL                 PCT < 0.1 ng/mL         Stopping of antibiotics            Stopping of antibiotics           strongly encouraged.               strongly encouraged.        ----------------------------     ------------------------------           PCT level decrease by               PCT < 0.25 ng/mL           >= 80% from peak PCT           OR PCT 0.25 - 0.5 ng/mL          Stopping of antibiotics                                                 encouraged.         Stopping of antibiotics               encouraged.        ----------------------------     ------------------------------           PCT level decrease by              PCT >=  0.25 ng/mL           < 80% from peak PCT            AND PCT >= 0.5 ng/mL            Continuing antibiotics                                                  encouraged.           Continuing antibiotics                encouraged.        ----------------------------     ------------------------------         PCT level increase compared          PCT > 0.5 ng/mL             with peak PCT AND              PCT >= 0.5 ng/mL             Escalation of antibiotics                                              strongly encouraged.          Escalation of antibiotics            strongly encouraged.  RESPIRATORY VIRUS PANEL     Status: Abnormal   Collection Time    01/29/14  3:21 AM      Result Value Ref Range   Source - [facility] NASAL SWAB     Comment: CORRECTED ON 02/25 AT 1823: PREVIOUSLY REPORTED AS NASAL SWAB   Respiratory Syncytial Virus A NOT DETECTED     Respiratory Syncytial Virus B NOT DETECTED     Influenza A DETECTED (*)    Influenza B NOT DETECTED     Parainfluenza 1 NOT DETECTED     Parainfluenza 2 NOT DETECTED     Parainfluenza 3 NOT DETECTED     Metapneumovirus NOT DETECTED     Rhinovirus NOT DETECTED     Adenovirus NOT DETECTED     Influenza A H1 NOT DETECTED     Influenza A H3 DETECTED (*)    Comment: (NOTE)           Normal Reference  Range for each Analyte: NOT DETECTED     Testing performed using the Lumine[employer] xTAG Respiratory Viral Panel test     kit.     This test was developed and its performance characteristics determined     by [employer]. It has not been cleared or approved by the [place]     Food and Drug Administration. This test is used for clinical purposes.     It should not be regarded as investigational or for research. This     laboratory is certified under the [employer] (CLIA) as qualified to perform high complexity     clinical laboratory testing.     Performed at [place], ARTERIAL     Status: Abnormal   Collection Time    01/29/14 12:00 PM  Result Value Ref Range   O2 Content 6.0     Delivery systems NASAL CANNULA     pH, Arterial 7.386  7.350 - 7.450   pCO2 arterial 36.9  35.0 - 45.0 mmHg   pO2, Arterial 58.2 (*) 80.0 - 100.0 mmHg   Bicarbonate 21.6  20.0 - 24.0 mEq/L   TCO2 22.8  0 - 100 mmol/L   Acid-base deficit 2.6 (*) 0.0 - 2.0 mmol/L   O2 Saturation 88.6     Patient temperature 98.6     Collection site RIGHT RADIAL     Drawn by [phone]     Sample type ARTERIAL DRAW     Allens test (pass/fail) PASS  PASS  VANCOMYCIN, TROUGH     Status: Abnormal   Collection Time    01/29/14  5:00 PM      Result Value Ref Range   Vancomycin Tr 21.2 (*) 10.0 - 20.0 ug/mL  HEPARIN LEVEL (UNFRACTIONATED)     Status: Abnormal   Collection Time    01/30/14  2:53 AM      Result Value Ref Range   Heparin Unfractionated 0.72 (*) 0.30 - 0.70 IU/mL   Comment:            IF HEPARIN RESULTS ARE BELOW     EXPECTED VALUES, AND PATIENT     DOSAGE HAS BEEN CONFIRMED,     SUGGEST FOLLOW UP TESTING     OF ANTITHROMBIN III LEVELS.  CBC     Status: Abnormal   Collection Time    01/30/14  2:53 AM      Result Value Ref Range   WBC 16.3 (*) 4.0 - 10.5 K/uL   RBC 3.69 (*) 4.22 - 5.81 MIL/uL   Hemoglobin 11.9 (*) 13.0 - 17.0 g/dL   HCT 34.0  (*) 39.0 - 52.0 %   MCV 92.1  78.0 - 100.0 fL   MCH 32.2  26.0 - 34.0 pg   MCHC 35.0  30.0 - 36.0 g/dL   RDW 15.6 (*) 11.5 - 15.5 %   Platelets 158  150 - 400 K/uL  PROCALCITONIN     Status: None   Collection Time    01/30/14  2:53 AM      Result Value Ref Range   Procalcitonin 1.44     Comment:            Interpretation:     PCT > 0.5 ng/mL and <= 2 ng/mL:     Systemic infection (sepsis) is possible,     but other conditions are known to elevate     PCT as well.     (NOTE)             ICU PCT Algorithm               Non ICU PCT Algorithm        ----------------------------     ------------------------------             PCT < 0.25 ng/mL                 PCT < 0.1 ng/mL         Stopping of antibiotics            Stopping of antibiotics           strongly encouraged.               strongly encouraged.        ----------------------------     ------------------------------  PCT level decrease by               PCT < 0.25 ng/mL           >= 80% from peak PCT           OR PCT 0.25 - 0.5 ng/mL          Stopping of antibiotics                                                 encouraged.         Stopping of antibiotics               encouraged.        ----------------------------     ------------------------------           PCT level decrease by              PCT >= 0.25 ng/mL           < 80% from peak PCT            AND PCT >= 0.5 ng/mL            Continuing antibiotics                                                  encouraged.           Continuing antibiotics                encouraged.        ----------------------------     ------------------------------         PCT level increase compared          PCT > 0.5 ng/mL             with peak PCT AND              PCT >= 0.5 ng/mL             Escalation of antibiotics                                              strongly encouraged.          Escalation of antibiotics            strongly encouraged.    Studies/Results: Dg Chest  Port 1 View  01/30/2014   CLINICAL DATA:  Short of breath  EXAM: PORTABLE CHEST - 1 VIEW  COMPARISON:  DG CHEST 1V PORT dated 01/29/2014; CT ANGIO CHEST W/CM &/OR WO/CM dated 01/27/2014  FINDINGS: There is continued volume loss in the right hemi thorax with dense opacity at the right base. Diffuse airspace disease throughout the left lung has increased. Mild airspace disease in the right lung is stable. No pneumothorax.  IMPRESSION: Increasing airspace disease throughout the left lung. Otherwise stable exam.   Electronically Signed   By: Maryclare Bean M.D.   On: 01/30/2014 07:42   Dg Chest Port 1 View  01/29/2014   CLINICAL DATA:  assess infiltate  EXAM: PORTABLE CHEST - 1 VIEW  COMPARISON:  CT ANGIO CHEST W/CM &/OR WO/CM dated 01/27/2014; DG CHEST 1V PORT dated 01/27/2014  FINDINGS: The cardiac silhouette is obscured secondary to the right lower lobe density. There has been interval development of diffuse prominence of the interstitial markings, peribronchial cuffing, indistinctness of the pulmonary vasculature. Low lung volumes. Persistent consolidative density projects in the right lung base. Remote rib fractures are again appreciated within the right hemithorax.  IMPRESSION: Interstitial infiltrate consistent with pulmonary edema. There is persistent consolidative density in the right lung base a component of which likely represents a small pleural effusion.   Electronically Signed   By: Margaree Mackintosh M.D.   On: 01/29/2014 12:08   Medications: Scheduled Meds: . ALPRAZolam  0.25 mg Oral TID  . aspirin EC  325 mg Oral Q24H  . budesonide (PULMICORT) nebulizer solution  0.5 mg Nebulization BID  . ceFEPime (MAXIPIME) IV  1 g Intravenous Q24H  . citalopram  20 mg Oral Q24H  . ferrous [id number] C-folic acid  1 capsule Oral Q24H  . furosemide  60 mg Intravenous Q8H  . ipratropium  0.5 mg Nebulization Q6H  . levalbuterol  0.63 mg Nebulization Q6H  . levofloxacin (LEVAQUIN) IV  750 mg Intravenous Q48H   . methylPREDNISolone (SOLU-MEDROL) injection  125 mg Intravenous Q6H  . pantoprazole  40 mg Oral Q24H  . potassium chloride  40 mEq Oral BID  . simvastatin  40 mg Oral Once per day on Mon Wed Fri  . sodium chloride  3 mL Intravenous Q12H  . sodium chloride  3 mL Intravenous Q12H  . vancomycin  750 mg Intravenous Q12H   Continuous Infusions: . heparin 1,300 Units/hr (01/30/14 0221)   PRN Meds:.acetaminophen, acetaminophen, temazepam  Assessment/Plan: Principal Problem:   Acute respiratory failure - respiratory status has improved since last night when he acutely worsened. Diuresis and increasing steroids has been helpful. Suspect certainly the respiratory failure is multifactorial. Concerned about narrowing of the right trachea bronchial tree seen on CT angiogram on admission. Could be recurrent tumor or stricture from radiation. 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   CARCINOMA, LUNG, SQUAMOUS CELL - bronchoscopy could contribute to information about recurrence or not   OSA (obstructive sleep apnea) - use CPAP last night but does not use at home   HCAP (healthcare-associated pneumonia) - possibly contributing to respiratory failure. Continue dual antibiotic therapy as per critical care.   T7 vertebral fracture - aware   Non-ST elevation MI (NSTEMI) - aware, as per cardiology   Anemia - hemoglobin stable at 11.9   Unspecified essential hypertension - blood pressure elevated - IV hydralazine when necessary. 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 Patient currently on high-dose intravenous Lasix and  was given metolazone yesterday as well with significant diuresis over past 2 shifts    Disposition - patient is afraid to the ICU. I will speak to him about the possibility of transferring there if necessary    I appreciate the excellent clinical help from pulmonary critical care, cardiology, and oncology    LOS: 3 days   [name], MD 01/30/2014, 8:39 AM

## 2014-01-30 NOTE — Progress Notes (Signed)
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  Required bipap during the night but now off and on NRB.  No dyspnea @ rest.  Feels much better.  Neg neg of 1.4L yesterday (3.1 for admission).  CURRENT MEDS . ALPRAZolam  0.25 mg Oral TID  . aspirin EC  325 mg Oral Q24H  . budesonide (PULMICORT) nebulizer solution  0.5 mg Nebulization BID  . ceFEPime (MAXIPIME) IV  1 g Intravenous Q24H  . citalopram  20 mg Oral Q24H  . ferrous [id number] C-folic acid  1 capsule Oral Q24H  . furosemide  60 mg Intravenous Q8H  . ipratropium  0.5 mg Nebulization Q6H  . levalbuterol  0.63 mg Nebulization Q6H  . levofloxacin (LEVAQUIN) IV  750 mg Intravenous Q48H  . methylPREDNISolone (SOLU-MEDROL) injection  125 mg Intravenous Q6H  . pantoprazole  40 mg Oral Q24H  . potassium chloride  40 mEq Oral BID  . simvastatin  40 mg Oral Once per day on Mon Wed Fri  . sodium chloride  3 mL Intravenous Q12H  . sodium chloride  3 mL Intravenous Q12H  . vancomycin  750 mg Intravenous Q12H    OBJECTIVE  Filed Vitals:   01/30/14 0400 01/30/14 0753 01/30/14 0757 01/30/14 0820  BP: 147/97   150/106  Pulse: 101  101 109  Temp: 97 F (36.1 C)   97.6 F (36.4 C)  TempSrc: Axillary   Axillary  Resp:   14 16  Height:      Weight:      SpO2: 98% 98% 98% 98%    Intake/Output Summary (Last 24 hours) at 01/30/14 1122 Last data filed at 01/30/14 0828  Gross per 24 hour  Intake    639 ml  Output   4350 ml  Net  -3711 ml   Filed Weights   01/28/14  0500 01/29/14 0434 01/30/14 0340  Weight: 174 lb 6.1 oz (79.1 kg) 174 lb 6.1 oz (79.1 kg) 174 lb 6.1 oz (79.1 kg)    PHYSICAL EXAM  General: Pleasant, NAD.  NRB on. Neuro: Alert and oriented X 3. Moves all extremities spontaneously. Psych: Normal affect. HEENT:  Normal  Neck: Supple without bruits.  Mod elevated jvp. Lungs:  Resp regular and unlabored, bibasilar crackles, ~ 1/2 up on left, diminished breath sounds right base, exp wheezing throughout. Heart: RRR, tachy,  no s3, s4, or murmurs. Abdomen: firm, protuberant, non-tender, BS + x 4.  Extremities: No clubbing, cyanosis or edema. DP/PT/Radials 2+ and equal bilaterally.  Accessory Clinical Findings  CBC  Recent Labs  01/28/14 0510  01/29/14 0246 01/30/14 0253  WBC 8.8  < > 14.2* 16.3*  NEUTROABS 8.5*  --   --   --   HGB 8.1*  < > 10.5* 11.9*  HCT 23.7*  < > 30.4* 34.0*  MCV 97.1  < > 93.5 92.1  PLT 124*  < > 131* 158  < > = values in this interval not displayed. Basic Metabolic Panel  Recent Labs  01/29/14 0246 01/30/14 0935  NA 140 146  K 3.7 3.7  CL 104 84*  CO2 21 20  GLUCOSE 131* 174*  BUN 26* 34*  CREATININE 1.52* 1.59*  CALCIUM 8.1* 8.2*   Liver Function Tests  Recent Labs  01/28/14 0510  AST 25  ALT 26  ALKPHOS 156*  BILITOT 0.4  PROT 6.2  ALBUMIN 2.3*   Cardiac Enzymes  Recent Labs  01/28/14 0950 01/28/14 2058 01/29/14 0246  TROPONINI 1.31* 1.55* 1.28*   Thyroid Function Tests  Recent Labs  01/28/14 0510  TSH 0.258*   TELE  Sinus tachycardia, low 100's to 1-teens.  Radiology/Studies   Dg Chest Port 1 View  01/30/2014   CLINICAL DATA:  Short of breath  EXAM: PORTABLE CHEST - 1 VIEW  COMPARISON:  DG CHEST 1V PORT dated 01/29/2014; CT ANGIO CHEST W/CM &/OR WO/CM dated 01/27/2014  FINDINGS: There is continued volume loss in the right hemi thorax with dense opacity at the right base. Diffuse airspace disease throughout the left lung has increased. Mild airspace disease in the  right lung is stable. No pneumothorax.  IMPRESSION: Increasing airspace disease throughout the left lung. Otherwise stable exam.   Electronically Signed   By: Maryclare Bean M.D.   On: 01/30/2014 07:42   2D Echocardiogram 2.24.2015  Study Conclusions  - Left ventricle: The cavity size was normal. Systolic function was moderately reduced. The estimated ejection fraction was in the range of 35% to 40%. There is hypokinesis of the mid anteroseptal and apical myocardium. Very challenging study, evenwith Definty contrast. Doppler parameters are consistent with abnormal left ventricular relaxation (grade 1 diastolic dysfunction). - Left atrium: The atrium was mildly dilated. _____________   ASSESSMENT AND PLAN  1.  Acute Resp Failure:  Multifactorial in the setting of HCAP, COPD, new CHF/cardiomyopathy, h/o lung CA.  Off of NIPPV->NRB. Inhalers/abx/steroids per pulm/CCM.  Bronch being considered.  Responding well to IV diuresis with -3.1L out this admission.  Wt stable @ 174.  He cont to have evidence of volume overload though BUN/Creat now rising.  2.  Acute systolic CHF/ICM:  EF 21-19% w/ anteroseptal and apical HK.  Improving with IV diuresis, though BUN/Creat now bumping. Still with crackles on exam.  Neck veins appear to be up, but difficult to assess in setting of girth.  Reduce lasix to 40 IV bid.  No bb in setting of active wheezing.  No acei/arb in setting of acute renal failure.  3.  NSTEMI/CAD:  Suspect type II NSTEMI in setting of #1/2 and demand ischemia.  No chest pain.  Trop trending down as of yesterday morning.  Overall trend relatively flat.  Echo showed new LV dysfxn.  Once resp status improves we will plan cath to eval anatomy.  Cont asa, statin.  No bb 2/2 acute resp failure/wheezing.  4.  HCAP:  See #1.  5.  Acute exacerbation of COPD:  See #1.  6.  Acute renal failure:  In setting of above and active diuresis.  Back off diuresis today.  F/u in am.  Signed, Murray Hodgkins  NP

## 2014-01-30 NOTE — Progress Notes (Signed)
ANTICOAGULATION CONSULT NOTE - Follow Up Consult  Pharmacy Consult for Heparin Indication: NSTEMI  Allergies  Allergen Reactions  . Hydrocodone-Acetaminophen     REACTION: Itch  . Oxycodone Itching  . Tramadol     Pt states will not take med b/c it does not work   . Zolpidem Tartrate Other (See Comments)    nightmares    Patient Measurements: Height: 5\' 7"  (170.2 cm) Weight: 174 lb 6.1 oz (79.1 kg) IBW/kg (Calculated) : 66.1 Heparin Dosing Weight: 79kg  Vital Signs: Temp: 97.6 F (36.4 C) (02/26 0820) Temp src: Axillary (02/26 0820) BP: 150/106 mmHg (02/26 0820) Pulse Rate: 109 (02/26 0820)  Labs:  Recent Labs  01/27/14 1740 01/27/14 1812  01/28/14 0510 01/28/14 0950 01/28/14 2058 01/29/14 0246 01/30/14 0253  HGB 9.9* 9.5*  < > 8.1*  --  10.2* 10.5* 11.9*  HCT 29.1* 28.0*  < > 23.7*  --  29.2* 30.4* 34.0*  PLT 134*  --   < > 124*  --  120* 131* 158  LABPROT 12.6  --   --   --   --   --   --   --   INR 0.96  --   --   --   --   --   --   --   HEPARINUNFRC  --   --   < >  --   --  0.47 0.62 0.72*  CREATININE  --  1.30  --  1.26  --   --  1.52*  --   TROPONINI  --   --   < > 0.66* 1.31* 1.55* 1.28*  --   < > = values in this interval not displayed.  Estimated Creatinine Clearance: 42.3 ml/min (by C-G formula based on Cr of 1.52).   Medications:  Heparin @ 1300 units/hr  Assessment: 72yom continues on heparin for NSTEMI. PE ruled out. Heparin level is slightly above goal today. CBC is stable. No bleeding reported.  Goal of Therapy:  Heparin level 0.3-0.7 units/ml Monitor platelets by anticoagulation protocol: Yes   Plan:  1) Decrease heparin to 1200 units/hr 2) Follow up heparin level, CBC in AM  Deboraha Sprang 01/30/2014,9:41 AM

## 2014-01-30 NOTE — Progress Notes (Signed)
Discussed with Marni Griffon and agree with the plan above. Jillyn Hidden PCCM Pager: [phone] Cell: [phone] If no response, call [phone]

## 2014-01-31 DIAGNOSIS — G4733 Obstructive sleep apnea (adult) (pediatric): Secondary | ICD-10-CM

## 2014-01-31 LAB — BASIC METABOLIC PANEL
BUN: 51 mg/dL — AB (ref 6–23)
CO2: 25 mEq/L (ref 19–32)
Calcium: 8.3 mg/dL — ABNORMAL LOW (ref 8.4–10.5)
Chloride: 92 mEq/L — ABNORMAL LOW (ref 96–112)
Creatinine, Ser: 2.57 mg/dL — ABNORMAL HIGH (ref 0.50–1.35)
GFR calc non Af Amer: 24 mL/min — ABNORMAL LOW (ref >90)
GFR, EST AFRICAN AMERICAN: 27 mL/min — AB (ref >90)
Glucose, Bld: 164 mg/dL — ABNORMAL HIGH (ref 70–99)
POTASSIUM: 4 meq/L (ref 3.7–5.3)
SODIUM: 135 meq/L — AB (ref 137–147)

## 2014-01-31 LAB — CBC
HCT: 33.8 % — ABNORMAL LOW (ref 39.0–52.0)
Hemoglobin: 12 g/dL — ABNORMAL LOW (ref 13.0–17.0)
MCH: 32.1 pg (ref 26.0–34.0)
MCHC: 35.5 g/dL (ref 30.0–36.0)
MCV: 90.4 fL (ref 78.0–100.0)
Platelets: 192 10*3/uL (ref 150–400)
RBC: 3.74 MIL/uL — ABNORMAL LOW (ref 4.22–5.81)
RDW: 14.9 % (ref 11.5–15.5)
WBC: 15.3 10*3/uL — ABNORMAL HIGH (ref 4.0–10.5)

## 2014-01-31 LAB — VANCOMYCIN, TROUGH: VANCOMYCIN TR: 36.8 ug/mL — AB (ref 10.0–20.0)

## 2014-01-31 LAB — HEPARIN LEVEL (UNFRACTIONATED): HEPARIN UNFRACTIONATED: 0.6 [IU]/mL (ref 0.30–0.70)

## 2014-01-31 MED ORDER — POTASSIUM CHLORIDE CRYS ER 20 MEQ PO TBCR: 20.0000 meq | EXTENDED_RELEASE_TABLET | Freq: Two times a day (BID) | ORAL | Status: DC

## 2014-01-31 MED ORDER — POTASSIUM CHLORIDE CRYS ER 20 MEQ PO TBCR
20.0000 meq | EXTENDED_RELEASE_TABLET | Freq: Every day | ORAL | Status: DC
  Administered 2014-01-31 – 2014-02-04 (×5): 20 meq via ORAL
  Filled 2014-01-31 (×5): qty 1

## 2014-01-31 MED ORDER — OSELTAMIVIR PHOSPHATE 30 MG PO CAPS
30.0000 mg | ORAL_CAPSULE | Freq: Two times a day (BID) | ORAL | Status: AC
  Administered 2014-01-31 – 2014-02-04 (×9): 30 mg via ORAL
  Filled 2014-01-31 (×10): qty 1

## 2014-01-31 MED ORDER — DOCUSATE SODIUM 100 MG PO CAPS
100.0000 mg | ORAL_CAPSULE | Freq: Two times a day (BID) | ORAL | Status: DC
  Administered 2014-01-31 – 2014-02-05 (×6): 100 mg via ORAL
  Filled 2014-01-31 (×12): qty 1

## 2014-01-31 MED ORDER — LEVOFLOXACIN IN D5W 500 MG/100ML IV SOLN
500.0000 mg | INTRAVENOUS | Status: DC
  Administered 2014-01-31 – 2014-02-02 (×2): 500 mg via INTRAVENOUS
  Filled 2014-01-31 (×2): qty 100

## 2014-01-31 MED ORDER — METHYLPREDNISOLONE SODIUM SUCC 125 MG IJ SOLR
80.0000 mg | Freq: Two times a day (BID) | INTRAMUSCULAR | Status: DC
  Administered 2014-01-31 – 2014-02-01 (×2): 80 mg via INTRAVENOUS
  Filled 2014-01-31: qty 2
  Filled 2014-01-31 (×4): qty 1.28

## 2014-01-31 NOTE — Progress Notes (Signed)
Name: Todd Villanueva MRN: [id number] DOB: 07/27/1941    ADMISSION DATE:  01/27/2014 CONSULTATION DATE:  01/28/2014  REFERRING MD :  [name] PRIMARY SERVICE:  Internal Medicine  CHIEF COMPLAINT:  SOB and cough  BRIEF PATIENT DESCRIPTION: 73 y.o. 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  [name] as outpatient, presents with worsening cough, wheezing, and SOB.  PCCM called for evaluation and assistance with medical care.  SIGNIFICANT EVENTS / STUDIES:  2/23 - admitted with SOB/cough, CXR suggestive of HCAP 2/23 ct chest>>>No embolus identified. Enlarging right upper paratracheal lymph node, 1.5 cm in short  axis, possibly reactive or malignant. Increasing airspace opacity at the inferior margin of the  remaining right upper lobe, nonspecific but potentially from radiation pneumonitis, pneumonia, or spread of tumor. 2/25 - worsening distress  LINES / TUBES: PIV  CULTURES: MRSA 2/24 >>> negative  ANTIBIOTICS: Zosyn 2/23 x 1 Cefepime 2/24 >>> Vancomycin 2/23 >>>  SUBJECTIVE:  Feels much better compared to yesterday but RN reports intermittent desaturation.  VITAL SIGNS: Temp:  [97.5 F (36.4 C)-98.2 F (36.8 C)] 98.2 F (36.8 C) (02/27 0800) Pulse Rate:  [102-109] 102 (02/27 0800) Resp:  [18] 18 (02/26 1345) BP: (93-144)/(59-95) 144/95 mmHg (02/27 0800) SpO2:  [90 %-96 %] 90 % (02/27 0800) FiO2 (%):  [44 %] 44 % (02/27 0725) Weight:  [76.9 kg (169 lb 8.5 oz)] 76.9 kg (169 lb 8.5 oz) (02/27 0439)  PHYSICAL EXAMINATION: General: Pleasant male,  Neuro: A&O x 3, non-focal.  HEENT: Hood/AT. PERRL, sclerae anicteric. Cardiovascular: RRR, no M/R/G.  Lungs: Respirations labored, ronchi rt. Abdomen: BS x 4, soft, NT/ND.  Musculoskeletal: No gross deformities, no edema.  Skin: Intact, warm, no rashes.  PULMONARY  Recent Labs Lab 01/27/14 1812 01/29/14 1200  PHART  --  7.386  PCO2ART  --  36.9  PO2ART  --  58.2*  HCO3  --  21.6  TCO2 26 22.8   O2SAT  --  88.6   CBC  Recent Labs Lab 01/29/14 0246 01/30/14 0253 01/31/14 0223  HGB 10.5* 11.9* 12.0*  HCT 30.4* 34.0* 33.8*  WBC 14.2* 16.3* 15.3*  PLT 131* 158 192    COAGULATION  Recent Labs Lab 01/27/14 1740  INR 0.96    CARDIAC    Recent Labs Lab 01/27/14 2035 01/28/14 0510 01/28/14 0950 01/28/14 2058 01/29/14 0246  TROPONINI 0.55* 0.66* 1.31* 1.55* 1.28*    Recent Labs Lab 01/27/14 1740  PROBNP 2835.0*     CHEMISTRY  Recent Labs Lab 01/27/14 1812 01/28/14 0510 01/29/14 0246 01/30/14 0935 01/31/14 0223  NA 133* 137 140 146 135*  K 3.8 3.6* 3.7 3.7 4.0  CL 98 101 104 84* 92*  CO2  --  17* 21 20 25   GLUCOSE 104* 298* 131* 174* 164*  BUN 27* 26* 26* 34* 51*  CREATININE 1.30 1.26 1.52* 1.59* 2.57*  CALCIUM  --  8.0* 8.1* 8.2* 8.3*   Estimated Creatinine Clearance: 25 ml/min (by C-G formula based on Cr of 2.57).   LIVER  Recent Labs Lab 01/27/14 1740 01/28/14 0510  AST  --  25  ALT  --  26  ALKPHOS  --  156*  BILITOT  --  0.4  PROT  --  6.2  ALBUMIN  --  2.3*  INR 0.96  --      INFECTIOUS  Recent Labs Lab 01/27/14 1943 01/28/14 1340 01/29/14 0246 01/30/14 0253  LATICACIDVEN 1.79  --   --   --  PROCALCITON  --  3.68 2.97 1.44     ENDOCRINE CBG (last 3)  No results found for this basename: GLUCAP,  in the last 72 hours   IMAGING x48h  Dg Chest Port 1 View  01/30/2014   CLINICAL DATA:  Short of breath  EXAM: PORTABLE CHEST - 1 VIEW  COMPARISON:  DG CHEST 1V PORT dated 01/29/2014; CT ANGIO CHEST W/CM &/OR WO/CM dated 01/27/2014  FINDINGS: There is continued volume loss in the right hemi thorax with dense opacity at the right base. Diffuse airspace disease throughout the left lung has increased. Mild airspace disease in the right lung is stable. No pneumothorax.  IMPRESSION: Increasing airspace disease throughout the left lung. Otherwise stable exam.   Electronically Signed   By: Maryclare Bean M.D.   On: 01/30/2014 07:42    Dg Chest Port 1 View  01/29/2014   CLINICAL DATA:  assess infiltate  EXAM: PORTABLE CHEST - 1 VIEW  COMPARISON:  CT ANGIO CHEST W/CM &/OR WO/CM dated 01/27/2014; DG CHEST 1V PORT dated 01/27/2014  FINDINGS: The cardiac silhouette is obscured secondary to the right lower lobe density. There has been interval development of diffuse prominence of the interstitial markings, peribronchial cuffing, indistinctness of the pulmonary vasculature. Low lung volumes. Persistent consolidative density projects in the right lung base. Remote rib fractures are again appreciated within the right hemithorax.  IMPRESSION: Interstitial infiltrate consistent with pulmonary edema. There is persistent consolidative density in the right lung base a component of which likely represents a small pleural effusion.   Electronically Signed   By: Margaree Mackintosh M.D.   On: 01/29/2014 12:08   01/27/14 --  COMPARISON: DG CHEST 1V PORT dated 01/27/2014; CT CHEST W/CM dated  08/12/2013; CT CHEST W/CM dated 02/11/2013; DG CHEST 2 VIEW dated  12/16/2013  FINDINGS:  The patient received 2 boluses of contrast. The first was a 50 cc  bolus of Omnipaque 350 which triggered the pressure limit. Repeated  exam with 80 cc of contrast performed with much better opacification  of the vascular structures. Similar truncated appearance of a right  pulmonary artery due to the lobectomies. No visualized filling  defects in the remaining right pulmonary arterial tree.  A mixed density appearance in the left lower lobe pulmonary arterial  structures appears to correlate directly with readily observable  motion artifact on the lung window images, and does not have a  characteristic morphology for pulmonary embolus. Accordingly, there  is abnormal hypodensity is attributed to artifact for example on  image 165 of series 16.  Right upper paratracheal lymph node, 1.5 cm in short axis on image  18 of series 15 (formerly 1.1 cm). Left prevascular node 0.9 cm  in  short axis (formerly 0.7 cm).  Coronary atherosclerosis. Thick pleural enhancement on the right  side with associated pleural fluid. Increase in interstitial and  airspace opacity in the base of the right lung as on image 56 of  series 17. Multiple right rib fractures or osteotomies, similar to  prior. Prominent emphysema noted. The remaining right  tracheobronchial tree is occluded or nearly occluded centrally on  images 42 through 47 of series 17.  There is a new 90% compression fracture at T7 with 3 mm of posterior  retropulsion contributing to central stenosis. . Benign compression  fracture favored over pathologic fracture particularly in light of  the relatively normal appearance of this vertebra on 08/12/2013.  Review of the MIP images confirms the above findings.  IMPRESSION:  1. No embolus identified.  2. Enlarging right upper paratracheal lymph node, 1.5 cm in short  axis, possibly reactive or malignant.  3. Increasing airspace opacity at the inferior margin of the  remaining right upper lobe, nonspecific but potentially from  radiation pneumonitis, pneumonia, or spread of tumor.  4. Acute 90% compression fracture of the T7 vertebra with 3 mm of  posterior retropulsion contributing to central stenosis. It is  5. Coronary atherosclerosis.  6. Thick pleural enhancement on the right side with right pleural  effusion, similar to prior.  7. Multiple right rib fractures are osteotomies, similar to prior.  8. Prominent emphysema.  9. 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   Plan: - Continue Budesonide 0.5mg  BID. - Continue scheduled Levalbulterol/Ipratroprium q4h, caution increase with ischemia, would not increase any further at this point. - PRN Levalbuterol from q3h prn - Continue Solumedrol IV and increase with worsening clinical status currently at 80 q12. - Continue antibiotics including levofloxacin for atypical coverage. - Follow cx data. - Would hold further diureses given renal function. - BiPAP PRN specially with desaturation. - May benefit from FOB at some point AFTER he stabilizes to assess for airway collapse or recurrence malignancy.  - Also with multi-organ system dysfunction, would consider discussing code status given overnight events or involvement of palliative care.  Rush Farmer, M.D. [facility] Pulmonary/Critical Care Medicine. Pager: [phone]. After hours pager: [phone].

## 2014-01-31 NOTE — Progress Notes (Signed)
Subjective: Patient feeling much better overall but still having a lot of difficulty maintaining O2 sats. Wheezing much improved. Acute renal failure on high-dose Lasix and will hold Lasix today. Have reduced Solu-Medrol some. Has thoracic compression fracture  Objective: Weight change: -4 lb 13.6 oz (-2.2 kg)  Intake/Output Summary (Last 24 hours) at 01/31/14 0830 Last data filed at 01/31/14 0600  Gross per 24 hour  Intake   1282 ml  Output   2450 ml  Net  -1168 ml   Filed Vitals:   01/31/14 0122 01/31/14 0400 01/31/14 0439 01/31/14 0725  BP:  127/80    Pulse:  108    Temp:  98 F (36.7 C)    TempSrc:  Axillary    Resp:      Height:      Weight:   169 lb 8.5 oz (76.9 kg)   SpO2: 94% 93%  92%    General Appearance: Alert, cooperative, no distress, appears stated age  Lungs: Minimal breath sounds on right, faint expiratory wheezes left base Heart: Regular rhythm, mild rate increase S1 and S2 normal, no murmur, rub or gallop  Abdomen: Soft, non-tender, bowel sounds active all four quadrants, no masses, no organomegaly  Extremities: Extremities normal, atraumatic, no cyanosis or edema  Neuro: Oriented x3, nonfocal, moves all extremities well, no dysarthria  Lab Results: Results for orders placed during the hospital encounter of 01/27/14 (from the past 48 hour(s))  BLOOD GAS, ARTERIAL     Status: Abnormal   Collection Time    01/29/14 12:00 PM      Result Value Ref Range   O2 Content 6.0     Delivery systems NASAL CANNULA     pH, Arterial 7.386  7.350 - 7.450   pCO2 arterial 36.9  35.0 - 45.0 mmHg   pO2, Arterial 58.2 (*) 80.0 - 100.0 mmHg   Bicarbonate 21.6  20.0 - 24.0 mEq/L   TCO2 22.8  0 - 100 mmol/L   Acid-base deficit 2.6 (*) 0.0 - 2.0 mmol/L   O2 Saturation 88.6     Patient temperature 98.6     Collection site RIGHT RADIAL     Drawn by [id number]     Sample type ARTERIAL DRAW     Allens test (pass/fail) PASS  PASS  VANCOMYCIN, TROUGH     Status: Abnormal    Collection Time    01/29/14  5:00 PM      Result Value Ref Range   Vancomycin Tr 21.2 (*) 10.0 - 20.0 ug/mL  HEPARIN LEVEL (UNFRACTIONATED)     Status: Abnormal   Collection Time    01/30/14  2:53 AM      Result Value Ref Range   Heparin Unfractionated 0.72 (*) 0.30 - 0.70 IU/mL   Comment:            IF HEPARIN RESULTS ARE BELOW     EXPECTED VALUES, AND PATIENT     DOSAGE HAS BEEN CONFIRMED,     SUGGEST FOLLOW UP TESTING     OF ANTITHROMBIN III LEVELS.  CBC     Status: Abnormal   Collection Time    01/30/14  2:53 AM      Result Value Ref Range   WBC 16.3 (*) 4.0 - 10.5 K/uL   RBC 3.69 (*) 4.22 - 5.81 MIL/uL   Hemoglobin 11.9 (*) 13.0 - 17.0 g/dL   HCT 34.0 (*) 39.0 - 52.0 %   MCV 92.1  78.0 - 100.0 fL   MCH 32.2  26.0 - 34.0 pg   MCHC 35.0  30.0 - 36.0 g/dL   RDW 15.6 (*) 11.5 - 15.5 %   Platelets 158  150 - 400 K/uL  PROCALCITONIN     Status: None   Collection Time    01/30/14  2:53 AM      Result Value Ref Range   Procalcitonin 1.44     Comment:            Interpretation:     PCT > 0.5 ng/mL and <= 2 ng/mL:     Systemic infection (sepsis) is possible,     but other conditions are known to elevate     PCT as well.     (NOTE)             ICU PCT Algorithm               Non ICU PCT Algorithm        ----------------------------     ------------------------------             PCT < 0.25 ng/mL                 PCT < 0.1 ng/mL         Stopping of antibiotics            Stopping of antibiotics           strongly encouraged.               strongly encouraged.        ----------------------------     ------------------------------           PCT level decrease by               PCT < 0.25 ng/mL           >= 80% from peak PCT           OR PCT 0.25 - 0.5 ng/mL          Stopping of antibiotics                                                 encouraged.         Stopping of antibiotics               encouraged.        ----------------------------     ------------------------------            PCT level decrease by              PCT >= 0.25 ng/mL           < 80% from peak PCT            AND PCT >= 0.5 ng/mL            Continuing antibiotics                                                  encouraged.           Continuing antibiotics                encouraged.        ----------------------------     ------------------------------  PCT level increase compared          PCT > 0.5 ng/mL             with peak PCT AND              PCT >= 0.5 ng/mL             Escalation of antibiotics                                              strongly encouraged.          Escalation of antibiotics            strongly encouraged.  BASIC METABOLIC PANEL     Status: Abnormal   Collection Time    01/30/14  9:35 AM      Result Value Ref Range   Sodium 146  137 - 147 mEq/L   Potassium 3.7  3.7 - 5.3 mEq/L   Chloride 84 (*) 96 - 112 mEq/L   CO2 20  19 - 32 mEq/L   Glucose, Bld 174 (*) 70 - 99 mg/dL   BUN 34 (*) 6 - 23 mg/dL   Creatinine, Ser 1.59 (*) 0.50 - 1.35 mg/dL   Calcium 8.2 (*) 8.4 - 10.5 mg/dL   GFR calc non Af Amer 42 (*) >90 mL/min   GFR calc Af Amer 49 (*) >90 mL/min   Comment: (NOTE)     The eGFR has been calculated using the CKD EPI equation.     This calculation has not been validated in all clinical situations.     eGFR's persistently <90 mL/min signify possible Chronic Kidney     Disease.  HEPARIN LEVEL (UNFRACTIONATED)     Status: None   Collection Time    01/31/14  2:23 AM      Result Value Ref Range   Heparin Unfractionated 0.60  0.30 - 0.70 IU/mL   Comment:            IF HEPARIN RESULTS ARE BELOW     EXPECTED VALUES, AND PATIENT     DOSAGE HAS BEEN CONFIRMED,     SUGGEST FOLLOW UP TESTING     OF ANTITHROMBIN III LEVELS.  CBC     Status: Abnormal   Collection Time    01/31/14  2:23 AM      Result Value Ref Range   WBC 15.3 (*) 4.0 - 10.5 K/uL   RBC 3.74 (*) 4.22 - 5.81 MIL/uL   Hemoglobin 12.0 (*) 13.0 - 17.0 g/dL   HCT 33.8 (*) 39.0 - 52.0 %    MCV 90.4  78.0 - 100.0 fL   MCH 32.1  26.0 - 34.0 pg   MCHC 35.5  30.0 - 36.0 g/dL   RDW 14.9  11.5 - 15.5 %   Platelets 192  150 - 400 K/uL  BASIC METABOLIC PANEL     Status: Abnormal   Collection Time    01/31/14  2:23 AM      Result Value Ref Range   Sodium 135 (*) 137 - 147 mEq/L   Comment: DELTA CHECK NOTED   Potassium 4.0  3.7 - 5.3 mEq/L   Chloride 92 (*) 96 - 112 mEq/L   Comment: DELTA CHECK NOTED   CO2 25  19 - 32 mEq/L   Glucose, Bld 164 (*) 70 - 99 mg/dL  BUN 51 (*) 6 - 23 mg/dL   Creatinine, Ser 2.57 (*) 0.50 - 1.35 mg/dL   Comment: DELTA CHECK NOTED   Calcium 8.3 (*) 8.4 - 10.5 mg/dL   GFR calc non Af Amer 24 (*) >90 mL/min   GFR calc Af Amer 27 (*) >90 mL/min   Comment: (NOTE)     The eGFR has been calculated using the CKD EPI equation.     This calculation has not been validated in all clinical situations.     eGFR's persistently <90 mL/min signify possible Chronic Kidney     Disease.    Studies/Results: Dg Chest Port 1 View  01/30/2014   CLINICAL DATA:  Short of breath  EXAM: PORTABLE CHEST - 1 VIEW  COMPARISON:  DG CHEST 1V PORT dated 01/29/2014; CT ANGIO CHEST W/CM &/OR WO/CM dated 01/27/2014  FINDINGS: There is continued volume loss in the right hemi thorax with dense opacity at the right base. Diffuse airspace disease throughout the left lung has increased. Mild airspace disease in the right lung is stable. No pneumothorax.  IMPRESSION: Increasing airspace disease throughout the left lung. Otherwise stable exam.   Electronically Signed   By: Maryclare Bean M.D.   On: 01/30/2014 07:42   Dg Chest Port 1 View  01/29/2014   CLINICAL DATA:  assess infiltate  EXAM: PORTABLE CHEST - 1 VIEW  COMPARISON:  CT ANGIO CHEST W/CM &/OR WO/CM dated 01/27/2014; DG CHEST 1V PORT dated 01/27/2014  FINDINGS: The cardiac silhouette is obscured secondary to the right lower lobe density. There has been interval development of diffuse prominence of the interstitial markings, peribronchial  cuffing, indistinctness of the pulmonary vasculature. Low lung volumes. Persistent consolidative density projects in the right lung base. Remote rib fractures are again appreciated within the right hemithorax.  IMPRESSION: Interstitial infiltrate consistent with pulmonary edema. There is persistent consolidative density in the right lung base a component of which likely represents a small pleural effusion.   Electronically Signed   By: Margaree Mackintosh M.D.   On: 01/29/2014 12:08   Medications: Scheduled Meds: . ALPRAZolam  0.25 mg Oral TID  . aspirin EC  325 mg Oral Q24H  . budesonide (PULMICORT) nebulizer solution  0.5 mg Nebulization BID  . ceFEPime (MAXIPIME) IV  1 g Intravenous Q24H  . citalopram  20 mg Oral Q24H  . ferrous [id number] C-folic acid  1 capsule Oral Q24H  . ipratropium  0.5 mg Nebulization Q6H  . levalbuterol  0.63 mg Nebulization Q6H  . levofloxacin (LEVAQUIN) IV  750 mg Intravenous Q48H  . methylPREDNISolone (SOLU-MEDROL) injection  80 mg Intravenous Q12H  . pantoprazole  40 mg Oral Q24H  . potassium chloride  40 mEq Oral BID  . simvastatin  40 mg Oral Once per day on Mon Wed Fri  . sodium chloride  3 mL Intravenous Q12H  . sodium chloride  3 mL Intravenous Q12H  . vancomycin  750 mg Intravenous Q12H   Continuous Infusions: . heparin 1,200 Units/hr (01/30/14 2215)   PRN Meds:.acetaminophen, acetaminophen, temazepam  Assessment/Plan:  Principal Problem:  Acute respiratory failure - respiratory status stabilizing but still hard to maintain oxygenation. Suspect certainly the respiratory failure is multifactorial. Concerned about narrowing of the right trachea bronchial tree seen on CT angiogram on admission. Could be recurrent tumor or stricture from radiation. Suspect a bronchoscopy might be quite helpful for diagnosis and treatment if clinically possible  Active Problems:  COPD with emphysema - reduce steroids some  CARCINOMA, LUNG, SQUAMOUS  CELL -  bronchoscopy could contribute to information about recurrence or not  OSA (obstructive sleep apnea) - does not use at home  HCAP (healthcare-associated pneumonia) - possibly contributing to respiratory failure. Continue dual antibiotic therapy as per critical care.  T7 vertebral fracture - aware  Non-ST elevation MI (NSTEMI) - aware, as per cardiology  Anemia - hemoglobin stable at 11.9  Unspecified essential hypertension - blood pressure elevated - IV hydralazine when necessary. 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 The bump in creatinine today. Disposition - patient is afraid to the ICU.  I appreciate the excellent clinical help from pulmonary critical care, cardiology, and oncology        LOS: 4 days   [name], MD 01/31/2014, 8:30 AM

## 2014-01-31 NOTE — Progress Notes (Signed)
ANTICOAGULATION CONSULT NOTE - Follow Up Consult  Pharmacy Consult for Heparin Indication: NSTEMI  Allergies  Allergen Reactions  . Hydrocodone-Acetaminophen     REACTION: Itch  . Oxycodone Itching  . Tramadol     Pt states will not take med b/c it does not work   . Zolpidem Tartrate Other (See Comments)    nightmares    Patient Measurements: Height: _0  (170.2 cm) Weight: 169 lb 8.5 oz (76.9 kg) IBW/kg (Calculated) : 66.1 Heparin Dosing Weight: 79kg  Labs:  Recent Labs  01/28/14 0950  01/28/14 2058 01/29/14 0246 01/30/14 0253 01/30/14 0935 01/31/14 0223  HGB  --   < > 10.2* 10.5* 11.9*  --  12.0*  HCT  --   < > 29.2* 30.4* 34.0*  --  33.8*  PLT  --   < > 120* 131* 158  --  192  HEPARINUNFRC  --   < > 0.47 0.62 0.72*  --  0.60  CREATININE  --   --   --  1.52*  --  1.59* 2.57*  TROPONINI 1.31*  --  1.55* 1.28*  --   --   --   < > = values in this interval not displayed.  Estimated Creatinine Clearance: 25 ml/min (by C-G formula based on Cr of 2.57).   Medications:  Heparin @ 1200 units/hr  Assessment: 72yom continues on heparin for NSTEMI. PE ruled out. Heparin level is therapeutic this morning. CBC is stable. No bleeding reported.  Goal of Therapy:  Heparin level 0.3-0.7 units/ml Monitor platelets by anticoagulation protocol: Yes   Plan:  -Continue heparin drip at 1200 units/hr -Follow up heparin level, CBC in AM  ANTIBIOTIC CONSULT NOTE - [facility] for vancomycin and cefepime Indication: HCAP  Vital Signs: Temp: 98 F (36.7 C) (02/27 0400) Temp src: Axillary (02/27 0400) BP: 127/80 mmHg (02/27 0400) Pulse Rate: 108 (02/27 0400) Intake/Output from previous day: 02/26 0701 - 02/27 0700 In: 1522 [P.O.:920; I.V.:252; IV Piggyback:350] Out: 3400 [Urine:3400] Intake/Output from this shift:    Labs:  Recent Labs  01/29/14 0246 01/30/14 0253 01/30/14 0935 01/31/14 0223  WBC 14.2* 16.3*  --  15.3*  HGB 10.5* 11.9*  --   12.0*  PLT 131* 158  --  192  CREATININE 1.52*  --  1.59* 2.57*   Estimated Creatinine Clearance: 25 ml/min (by C-G formula based on Cr of 2.57).  Recent Labs  01/29/14 1700  [place] 21.2*     Microbiology: Recent Results (from the past 720 hour(s))  MRSA PCR SCREENING     Status: None   Collection Time    01/28/14  1:24 AM      Result Value Ref Range Status   MRSA by PCR NEGATIVE  NEGATIVE Final   Comment:            The GeneXpert MRSA Assay (FDA     approved for NASAL specimens     only), is one component of a     comprehensive MRSA colonization     surveillance program. It is not     intended to diagnose MRSA     infection nor to guide or     monitor treatment for     MRSA infections.  RESPIRATORY VIRUS PANEL     Status: Abnormal   Collection Time    01/29/14  3:21 AM      Result Value Ref Range Status   Source - RVPAN NASAL SWAB   Corrected  Comment: CORRECTED ON 02/25 AT 1823: PREVIOUSLY REPORTED AS NASAL SWAB   Respiratory Syncytial Virus A NOT DETECTED   Final   Respiratory Syncytial Virus B NOT DETECTED   Final   Influenza A DETECTED (*)  Final   Influenza B NOT DETECTED   Final   Parainfluenza 1 NOT DETECTED   Final   Parainfluenza 2 NOT DETECTED   Final   Parainfluenza 3 NOT DETECTED   Final   Metapneumovirus NOT DETECTED   Final   Rhinovirus NOT DETECTED   Final   Adenovirus NOT DETECTED   Final   Influenza A H1 NOT DETECTED   Final   Influenza A H3 DETECTED (*)  Final   Comment: (NOTE)           Normal Reference Range for each Analyte: NOT DETECTED     Testing performed using the Luminex xTAG Respiratory Viral Panel test     kit.     This test was developed and its performance characteristics determined     by [employer]. It has not been cleared or approved by the [place]     Food and Drug Administration. This test is used for clinical purposes.     It should not be regarded as investigational or for research. This     laboratory is  certified under the [place] (CLIA) as qualified to perform high complexity     clinical laboratory testing.     Performed at [facility]   Start     Dose/Rate Route Frequency Ordered Stop   01/31/14 1300  levofloxacin (LEVAQUIN) IVPB 500 mg     500 mg 100 mL/hr over 60 Minutes Intravenous Every 48 hours 01/31/14 0845     01/30/14 0530  ceFEPIme (MAXIPIME) 1 g in dextrose 5 % 50 mL IVPB     1 g 100 mL/hr over 30 Minutes Intravenous Every 24 hours 01/29/14 0949     01/29/14 1300  levofloxacin (LEVAQUIN) IVPB 750 mg  Status:  Discontinued     750 mg 100 mL/hr over 90 Minutes Intravenous Every 48 hours 01/29/14 1147 01/31/14 0845   01/28/14 0600  vancomycin (VANCOCIN) IVPB 750 mg/150 ml premix  Status:  Discontinued     750 mg 150 mL/hr over 60 Minutes Intravenous Every 12 hours 01/28/14 0431 01/31/14 0839   01/28/14 0600  ceFEPIme (MAXIPIME) 1 g in dextrose 5 % 50 mL IVPB  Status:  Discontinued     1 g 100 mL/hr over 30 Minutes Intravenous Every 12 hours 01/28/14 0431 01/29/14 0949   01/27/14 1900  vancomycin (VANCOCIN) injection 1,500 mg  Status:  Discontinued     1,500 mg Intravenous  Once 01/27/14 1851 01/27/14 1854   01/27/14 1900  piperacillin-tazobactam (ZOSYN) IVPB 3.375 g     3.375 g 100 mL/hr over 30 Minutes Intravenous  Once 01/27/14 1851 01/27/14 1932   01/27/14 1900  vancomycin (VANCOCIN) 1,500 mg in sodium chloride 0.9 % 500 mL IVPB     1,500 mg 250 mL/hr over 120 Minutes Intravenous  Once 01/27/14 1855 01/27/14 2225      Assessment: Patient is also on day 4 vancomycin and cefepime for HCAP. SCr jumped to 2.57 this morning. He had good UOP yesterday but received furosemide. Last vancomycin trough was already at the upper end of normal so likely this trough to be elevated.  Goal of Therapy:  Vancomycin trough level 15-20  mcg/ml  Plan:  -Vancomycin trough at 17:30 today -Vancomycin order taken  off MAR until trough evaluated by pharmacy -Change Levaquin to 500 mg IV q48h -Continue cefepime 1 g IV q24h  [facility], [place].D., BCPS Clinical Pharmacist Pager: [phone] 01/31/2014 8:53 AM

## 2014-01-31 NOTE — Progress Notes (Signed)
CRITICAL VALUE ALERT  Critical value received:  Vancomycin Trough 36.8   Date of notification:  01/31/2014  Time of notification:  2030  Critical value read back:yes  Nurse who received alert:  Annice Pih  MD notified (1st page):  Dr. [name]  Time of first page:  2105  MD notified (2nd page):  Time of second page:  Responding MD:  Dr. [name]  Time MD responded:  2110  Pts. Vancomycin was discontinued earlier in the day. No new orders at this time. Will continue to monitor pt.

## 2014-01-31 NOTE — Progress Notes (Signed)
Subjective: Feels like he's getting better but he just can't catch his breath.    Objective: Vital signs in last 24 hours: Temp:  [96.7 F (35.9 C)-98.2 F (36.8 C)] 98.2 F (36.8 C) (02/27 0800) Pulse Rate:  [102-109] 102 (02/27 0800) Resp:  [16-18] 18 (02/26 1345) BP: (93-149)/(59-99) 144/95 mmHg (02/27 0800) SpO2:  [90 %-97 %] 90 % (02/27 0800) FiO2 (%):  [44 %] 44 % (02/27 0725) Weight:  [169 lb 8.5 oz (76.9 kg)] 169 lb 8.5 oz (76.9 kg) (02/27 0439) Last BM Date: 01/29/14  Intake/Output from previous day: 02/26 0701 - 02/27 0700 In: 1522 [P.O.:920; I.V.:252; IV Piggyback:350] Out: 3400 [Urine:3400] Intake/Output this shift: Total I/O In: 250 [P.O.:250] Out: 180 [Urine:180]  Medications Current Facility-Administered Medications  Medication Dose Route Frequency Provider Last Rate Last Dose  . acetaminophen (TYLENOL) tablet 650 mg  650 mg Oral Q6H PRN Rise Patience, MD       Or  . acetaminophen (TYLENOL) suppository 650 mg  650 mg Rectal Q6H PRN Rise Patience, MD      . ALPRAZolam Duanne Moron) tablet 0.25 mg  0.25 mg Oral TID Josetta Huddle, MD   0.25 mg at 01/31/14 1038  . aspirin EC tablet 325 mg  325 mg Oral Q24H Lauren Bajbus, RPH   325 mg at 01/31/14 0828  . budesonide (PULMICORT) nebulizer solution 0.5 mg  0.5 mg Nebulization BID Josetta Huddle, MD   0.5 mg at 01/31/14 0723  . ceFEPIme (MAXIPIME) 1 g in dextrose 5 % 50 mL IVPB  1 g Intravenous Q24H Benjamine Sprague [place], RPH   1 g at 01/31/14 0434  . citalopram (CELEXA) tablet 20 mg  20 mg Oral Q24H Lauren Bajbus, RPH   20 mg at 01/31/14 0830  . docusate sodium (COLACE) capsule 100 mg  100 mg Oral BID Josetta Huddle, MD   100 mg at 01/31/14 1038  . ferrous [id number] C-folic acid (TRINSICON / FOLTRIN) capsule 1 capsule  1 capsule Oral Q24H Lauren Bajbus, RPH   1 capsule at 01/31/14 0830  . heparin ADULT infusion 100 units/mL (25000 units/250 mL)  1,200 Units/hr Intravenous Continuous Deboraha Sprang,  [facility] 12 mL/hr at 01/30/14 2215 1,200 Units/hr at 01/30/14 2215  . ipratropium (ATROVENT) nebulizer solution 0.5 mg  0.5 mg Nebulization Q6H Erick Colace, NP   0.5 mg at 01/31/14 6295  . levalbuterol (XOPENEX) nebulizer solution 0.63 mg  0.63 mg Nebulization Q6H Erick Colace, NP   0.63 mg at 01/31/14 2841  . levofloxacin (LEVAQUIN) IVPB 500 mg  500 mg Intravenous Q48H [place], [place]      . methylPREDNISolone sodium succinate (SOLU-MEDROL) 125 mg/2 mL injection 80 mg  80 mg Intravenous Q12H Josetta Huddle, MD      . pantoprazole (PROTONIX) EC tablet 40 mg  40 mg Oral Q24H Lauren Bajbus, RPH   40 mg at 01/31/14 0829  . potassium chloride SA (K-DUR,KLOR-CON) CR tablet 20 mEq  20 mEq Oral Daily Josetta Huddle, MD   20 mEq at 01/31/14 0844  . simvastatin (ZOCOR) tablet 40 mg  40 mg Oral Once per day on Mon Wed Fri Lauren Bajbus, RPH   40 mg at 01/31/14 3244  . sodium chloride 0.9 % injection 3 mL  3 mL Intravenous Q12H Rise Patience, MD      . sodium chloride 0.9 % injection 3 mL  3 mL Intravenous Q12H Rise Patience, MD   3 mL at 01/29/14 2120  .  temazepam (RESTORIL) capsule 30 mg  30 mg Oral QHS PRN Rise Patience, MD   30 mg at 01/30/14 2215    PE: General appearance: alert, cooperative and mild distress Lungs: clear to auscultation bilaterally Heart: reg rhythm, rate fast.   Abdomen: +BS, nontender Extremities: No LEE Pulses: 2+ and symmetric Skin:  warm and dry Neurologic: Grossly normal  Lab Results:   Recent Labs  01/29/14 0246 01/30/14 0253 01/31/14 0223  WBC 14.2* 16.3* 15.3*  HGB 10.5* 11.9* 12.0*  HCT 30.4* 34.0* 33.8*  PLT 131* 158 192   BMET  Recent Labs  01/29/14 0246 01/30/14 0935 01/31/14 0223  NA 140 146 135*  K 3.7 3.7 4.0  CL 104 84* 92*  CO2 21 20 25   GLUCOSE 131* 174* 164*  BUN 26* 34* 51*  CREATININE 1.52* 1.59* 2.57*  CALCIUM 8.1* 8.2* 8.3*    Assessment/Plan    Principal Problem:   Acute on chronic respiratory  failure Active Problems:   COPD with emphysema   CARCINOMA, LUNG, SQUAMOUS CELL   OSA (obstructive sleep apnea)   HCAP (healthcare-associated pneumonia)   T7 vertebral fracture   Non-ST elevation MI (NSTEMI)   Anemia   Unspecified essential hypertension   Other and unspecified hyperlipidemia   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  Mildly light-headed.  Hr to 178.  None since last night.  Not on a beta blocker, but with acute respiratory failure and COPD, not a could idea.  Continue to monitor.  Net fluids:  -1.9/-3.4L.  Recent echo: EF 35-40%, hypokinesis of the mid anteroseptal and apical myocardium, grade one diastolic dysf.  AKI- SCr increased from 1.59 to 2.57.  Lasix held today.   Too dry I think.   NSTEMI: peak troponin 1.55.  No ischemia on Nuc last May and echo at that time showed EF of 55-60%.  Recommend coronary evaluation, however, with worsening SCr it will need to wait.  Anemia- SP transfusion. Stable.     LOS: 4 days    Todd Villanueva 01/31/2014 11:02 AM

## 2014-01-31 NOTE — Progress Notes (Addendum)
Agree hold further diuresis. We have removed any CHF component from the picture. Further ischemic eval based on prognosis and other medical problems. Resume lasix 20 or 40 mg daily once renal function stabilizes.

## 2014-01-31 NOTE — Progress Notes (Signed)
ANTIBIOTIC CONSULT NOTE - Follow Up  Pharmacy Consult for Vancomycin Indication: rule out pneumonia  Allergies  Allergen Reactions  . Hydrocodone-Acetaminophen     REACTION: Itch  . Oxycodone Itching  . Tramadol     Pt states will not take med b/c it does not work   . Zolpidem Tartrate Other (See Comments)    nightmares    Patient Measurements: Height: _0  (170.2 cm) Weight: 169 lb 8.5 oz (76.9 kg) IBW/kg (Calculated) : 66.1  Vital Signs: Temp: 97.1 F (36.2 C) (02/27 1915) Temp src: Axillary (02/27 1915) BP: 132/85 mmHg (02/27 1915) Pulse Rate: 102 (02/27 1915) Intake/Output from previous day: 02/26 0701 - 02/27 0700 In: 1522 [P.O.:920; I.V.:252; IV Piggyback:350] Out: 3400 [Urine:3400] Intake/Output from this shift: Total I/O In: -  Out: 150 [Urine:150]  Labs:  Recent Labs  01/29/14 0246 01/30/14 0253 01/30/14 0935 01/31/14 0223  WBC 14.2* 16.3*  --  15.3*  HGB 10.5* 11.9*  --  12.0*  PLT 131* 158  --  192  CREATININE 1.52*  --  1.59* 2.57*   Estimated Creatinine Clearance: 25 ml/min (by C-G formula based on Cr of 2.57).  Recent Labs  01/29/14 1700 01/31/14 1858  VANCOTROUGH 21.2* 36.8*     Microbiology: Recent Results (from the past 720 hour(s))  MRSA PCR SCREENING     Status: None   Collection Time    01/28/14  1:24 AM      Result Value Ref Range Status   MRSA by PCR NEGATIVE  NEGATIVE Final   Comment:            The GeneXpert MRSA Assay (FDA     approved for NASAL specimens     only), is one component of a     comprehensive MRSA colonization     surveillance program. It is not     intended to diagnose MRSA     infection nor to guide or     monitor treatment for     MRSA infections.  RESPIRATORY VIRUS PANEL     Status: Abnormal   Collection Time    01/29/14  3:21 AM      Result Value Ref Range Status   Source - RVPAN NASAL SWAB   Corrected   Comment: CORRECTED ON 02/25 AT 1823: PREVIOUSLY REPORTED AS NASAL SWAB   Respiratory  Syncytial Virus A NOT DETECTED   Final   Respiratory Syncytial Virus B NOT DETECTED   Final   Influenza A DETECTED (*)  Final   Influenza B NOT DETECTED   Final   Parainfluenza 1 NOT DETECTED   Final   Parainfluenza 2 NOT DETECTED   Final   Parainfluenza 3 NOT DETECTED   Final   Metapneumovirus NOT DETECTED   Final   Rhinovirus NOT DETECTED   Final   Adenovirus NOT DETECTED   Final   Influenza A H1 NOT DETECTED   Final   Influenza A H3 DETECTED (*)  Final   Comment: (NOTE)           Normal Reference Range for each Analyte: NOT DETECTED     Testing performed using the Luminex xTAG Respiratory Viral Panel test     kit.     This test was developed and its performance characteristics determined     by [employer]. It has not been cleared or approved by the [place]     Food and Drug Administration. This test is used for clinical purposes.  It should not be regarded as investigational or for research. This     laboratory is certified under the [place] (CLIA) as qualified to perform high complexity     clinical laboratory testing.     Performed at [facility]: 73 yo male with SOB/cough, possible PNA, for empiric antibiotics. Vanc level came back high this PM. She did have a bump in her creatinine. We'll cont to hold the dose and check AM level  Goal of Therapy:  Vancomycin trough level 15-20 mcg/ml  Plan:   Hold vanc  Level tomorrow

## 2014-02-01 ENCOUNTER — Inpatient Hospital Stay (HOSPITAL_COMMUNITY): Payer: 59

## 2014-02-01 DIAGNOSIS — J09X2 Influenza due to identified novel influenza A virus with other respiratory manifestations: Secondary | ICD-10-CM | POA: Diagnosis present

## 2014-02-01 LAB — CBC
HCT: 33.4 % — ABNORMAL LOW (ref 39.0–52.0)
Hemoglobin: 11.7 g/dL — ABNORMAL LOW (ref 13.0–17.0)
MCH: 32.1 pg (ref 26.0–34.0)
MCHC: 35 g/dL (ref 30.0–36.0)
MCV: 91.5 fL (ref 78.0–100.0)
PLATELETS: 195 10*3/uL (ref 150–400)
RBC: 3.65 MIL/uL — ABNORMAL LOW (ref 4.22–5.81)
RDW: 14.6 % (ref 11.5–15.5)
WBC: 14.2 10*3/uL — AB (ref 4.0–10.5)

## 2014-02-01 LAB — BASIC METABOLIC PANEL
BUN: 68 mg/dL — ABNORMAL HIGH (ref 6–23)
CHLORIDE: 92 meq/L — AB (ref 96–112)
CO2: 21 mEq/L (ref 19–32)
CREATININE: 2.84 mg/dL — AB (ref 0.50–1.35)
Calcium: 8.3 mg/dL — ABNORMAL LOW (ref 8.4–10.5)
GFR calc non Af Amer: 21 mL/min — ABNORMAL LOW (ref >90)
GFR, EST AFRICAN AMERICAN: 24 mL/min — AB (ref >90)
Glucose, Bld: 169 mg/dL — ABNORMAL HIGH (ref 70–99)
Potassium: 4.4 mEq/L (ref 3.7–5.3)
Sodium: 133 mEq/L — ABNORMAL LOW (ref 137–147)

## 2014-02-01 LAB — HEPARIN LEVEL (UNFRACTIONATED): HEPARIN UNFRACTIONATED: 0.55 [IU]/mL (ref 0.30–0.70)

## 2014-02-01 LAB — VANCOMYCIN, RANDOM: VANCOMYCIN RM: 25.8 ug/mL

## 2014-02-01 MED ORDER — METHYLPREDNISOLONE SODIUM SUCC 125 MG IJ SOLR
60.0000 mg | Freq: Two times a day (BID) | INTRAMUSCULAR | Status: DC
  Filled 2014-02-01 (×2): qty 0.96

## 2014-02-01 MED ORDER — METHYLPREDNISOLONE SODIUM SUCC 40 MG IJ SOLR
40.0000 mg | Freq: Two times a day (BID) | INTRAMUSCULAR | Status: DC
  Administered 2014-02-01 – 2014-02-02 (×3): 40 mg via INTRAVENOUS
  Filled 2014-02-01 (×6): qty 1

## 2014-02-01 NOTE — Progress Notes (Addendum)
CONSULT NOTE - Follow Up Consult  Pharmacy Consult for Heparin/Vancomycin/Cefepime Indication: NSTEMI/HCAP  Allergies  Allergen Reactions  . Hydrocodone-Acetaminophen     REACTION: Itch  . Oxycodone Itching  . Tramadol     Pt states will not take med b/c it does not work   . Zolpidem Tartrate Other (See Comments)    nightmares    Patient Measurements: Height: 5\' 7"  (170.2 cm) Weight: 169 lb 5 oz (76.8 kg) IBW/kg (Calculated) : 66.1 Heparin Dosing Weight: 79kg  Labs:  Recent Labs  01/30/14 0253 01/30/14 0935 01/31/14 0223 02/01/14 0254  HGB 11.9*  --  12.0* 11.7*  HCT 34.0*  --  33.8* 33.4*  PLT 158  --  192 195  HEPARINUNFRC 0.72*  --  0.60 0.55  CREATININE  --  1.59* 2.57* 2.84*    Estimated Creatinine Clearance: 22.6 ml/min (by C-G formula based on Cr of 2.84).   Assessment: 72yom continues on heparin for NSTEMI. PE ruled out. Heparin level remains therapeutic CBC is stable. No bleeding reported.  ID: Patient also on Vanc/Cefepime/Levo for HCAP in setting on Squamous cell lung cancer s/p chemo and COPD. CXR shows RLL infiltrate w/ underlying lung disease. Influenza A+, now on tamiflu. Patient is afebrile with elevated wbc, and declining renal function.   2/24 Vanc>> 2/24 Cefepime>> 2/25 Levo>> 2/27 tamiflu>>  2/27 VT 36.8 >>holding vanc  Goal of Therapy:  Heparin level 0.3-0.7 units/ml Monitor platelets by anticoagulation protocol: Yes Eradicate Infection   Plan:  -Continue heparin drip at 1200 units/hr -Follow up heparin level, CBC in AM -Continue Cefepime 1g IV q24h -Will continue to hold Vanc, as renal function continues to decline - Follow up Vanc random with AM labs tomorrow, will dose pending VT <20

## 2014-02-01 NOTE — Progress Notes (Addendum)
Subjective: Patient is feeling better overall. Much less short of breath. Tested positive for influenza A H3 and Tamiflu has been started. Very unusual presentation for influenza. Feeling somewhat anxious from steroids and breathing treatments quite probably contributing to anxiety  Objective: Weight change: -3.5 oz (-0.1 kg)  Intake/Output Summary (Last 24 hours) at 02/01/14 0811 Last data filed at 02/01/14 0600  Gross per 24 hour  Intake 1120.2 ml  Output   1215 ml  Net  -94.8 ml   Filed Vitals:   02/01/14 0109 02/01/14 0115 02/01/14 0356 02/01/14 0500  BP: 134/80  137/83   Pulse: 98  97   Temp: 97.9 F (36.6 C)  97.9 F (36.6 C)   TempSrc: Axillary  Axillary   Resp:      Height:      Weight:    169 lb 5 oz (76.8 kg)  SpO2: 97% 97% 99%     General Appearance: Alert, cooperative, no distress, appears stated age  Lungs: Minimal breath sounds on right, no wheezes currently  Heart: Regular rhythm, mild rate increase S1 and S2 normal, no murmur, rub or gallop  Abdomen: Soft, non-tender, bowel sounds active all four quadrants, no masses, no organomegaly  Extremities: Extremities normal, atraumatic, no cyanosis or edema  Neuro: Oriented x3, nonfocal, moves all extremities well, no dysarthria  Lab Results: Results for orders placed during the hospital encounter of 01/27/14 (from the past 48 hour(s))  BASIC METABOLIC PANEL     Status: Abnormal   Collection Time    01/30/14  9:35 AM      Result Value Ref Range   Sodium 146  137 - 147 mEq/L   Potassium 3.7  3.7 - 5.3 mEq/L   Chloride 84 (*) 96 - 112 mEq/L   CO2 20  19 - 32 mEq/L   Glucose, Bld 174 (*) 70 - 99 mg/dL   BUN 34 (*) 6 - 23 mg/dL   Creatinine, Ser 1.59 (*) 0.50 - 1.35 mg/dL   Calcium 8.2 (*) 8.4 - 10.5 mg/dL   GFR calc non Af Amer 42 (*) >90 mL/min   GFR calc Af Amer 49 (*) >90 mL/min   Comment: (NOTE)     The eGFR has been calculated using the CKD EPI equation.     This calculation has not been validated in all  clinical situations.     eGFR's persistently <90 mL/min signify possible Chronic Kidney     Disease.  HEPARIN LEVEL (UNFRACTIONATED)     Status: None   Collection Time    01/31/14  2:23 AM      Result Value Ref Range   Heparin Unfractionated 0.60  0.30 - 0.70 IU/mL   Comment:            IF HEPARIN RESULTS ARE BELOW     EXPECTED VALUES, AND PATIENT     DOSAGE HAS BEEN CONFIRMED,     SUGGEST FOLLOW UP TESTING     OF ANTITHROMBIN III LEVELS.  CBC     Status: Abnormal   Collection Time    01/31/14  2:23 AM      Result Value Ref Range   WBC 15.3 (*) 4.0 - 10.5 K/uL   RBC 3.74 (*) 4.22 - 5.81 MIL/uL   Hemoglobin 12.0 (*) 13.0 - 17.0 g/dL   HCT 33.8 (*) 39.0 - 52.0 %   MCV 90.4  78.0 - 100.0 fL   MCH 32.1  26.0 - 34.0 pg   MCHC 35.5  30.0 -  36.0 g/dL   RDW 14.9  11.5 - 15.5 %   Platelets 192  150 - 400 K/uL  BASIC METABOLIC PANEL     Status: Abnormal   Collection Time    01/31/14  2:23 AM      Result Value Ref Range   Sodium 135 (*) 137 - 147 mEq/L   Comment: DELTA CHECK NOTED   Potassium 4.0  3.7 - 5.3 mEq/L   Chloride 92 (*) 96 - 112 mEq/L   Comment: DELTA CHECK NOTED   CO2 25  19 - 32 mEq/L   Glucose, Bld 164 (*) 70 - 99 mg/dL   BUN 51 (*) 6 - 23 mg/dL   Creatinine, Ser 2.57 (*) 0.50 - 1.35 mg/dL   Comment: DELTA CHECK NOTED   Calcium 8.3 (*) 8.4 - 10.5 mg/dL   GFR calc non Af Amer 24 (*) >90 mL/min   GFR calc Af Amer 27 (*) >90 mL/min   Comment: (NOTE)     The eGFR has been calculated using the CKD EPI equation.     This calculation has not been validated in all clinical situations.     eGFR's persistently <90 mL/min signify possible Chronic Kidney     Disease.  VANCOMYCIN, TROUGH     Status: Abnormal   Collection Time    01/31/14  6:58 PM      Result Value Ref Range   Vancomycin Tr 36.8 (*) 10.0 - 20.0 ug/mL   Comment: CRITICAL RESULT CALLED TO, READ BACK BY AND VERIFIED WITH:     Kennon Portela 01/31/14 2013 SHIPMAN M  HEPARIN LEVEL (UNFRACTIONATED)      Status: None   Collection Time    02/01/14  2:54 AM      Result Value Ref Range   Heparin Unfractionated 0.55  0.30 - 0.70 IU/mL   Comment:            IF HEPARIN RESULTS ARE BELOW     EXPECTED VALUES, AND PATIENT     DOSAGE HAS BEEN CONFIRMED,     SUGGEST FOLLOW UP TESTING     OF ANTITHROMBIN III LEVELS.  CBC     Status: Abnormal   Collection Time    02/01/14  2:54 AM      Result Value Ref Range   WBC 14.2 (*) 4.0 - 10.5 K/uL   RBC 3.65 (*) 4.22 - 5.81 MIL/uL   Hemoglobin 11.7 (*) 13.0 - 17.0 g/dL   HCT 33.4 (*) 39.0 - 52.0 %   MCV 91.5  78.0 - 100.0 fL   MCH 32.1  26.0 - 34.0 pg   MCHC 35.0  30.0 - 36.0 g/dL   RDW 14.6  11.5 - 15.5 %   Platelets 195  150 - 400 K/uL  BASIC METABOLIC PANEL     Status: Abnormal   Collection Time    02/01/14  2:54 AM      Result Value Ref Range   Sodium 133 (*) 137 - 147 mEq/L   Potassium 4.4  3.7 - 5.3 mEq/L   Chloride 92 (*) 96 - 112 mEq/L   CO2 21  19 - 32 mEq/L   Glucose, Bld 169 (*) 70 - 99 mg/dL   BUN 68 (*) 6 - 23 mg/dL   Creatinine, Ser 2.84 (*) 0.50 - 1.35 mg/dL   Calcium 8.3 (*) 8.4 - 10.5 mg/dL   GFR calc non Af Amer 21 (*) >90 mL/min   GFR calc Af Amer 24 (*) >90 mL/min  Comment: (NOTE)     The eGFR has been calculated using the CKD EPI equation.     This calculation has not been validated in all clinical situations.     eGFR's persistently <90 mL/min signify possible Chronic Kidney     Disease.    Studies/Results: No results found. Medications: Scheduled Meds: . ALPRAZolam  0.25 mg Oral TID  . aspirin EC  325 mg Oral Q24H  . budesonide (PULMICORT) nebulizer solution  0.5 mg Nebulization BID  . ceFEPime (MAXIPIME) IV  1 g Intravenous Q24H  . citalopram  20 mg Oral Q24H  . docusate sodium  100 mg Oral BID  . ferrous [id number] C-folic acid  1 capsule Oral Q24H  . ipratropium  0.5 mg Nebulization Q6H  . levalbuterol  0.63 mg Nebulization Q6H  . levofloxacin (LEVAQUIN) IV  500 mg Intravenous Q48H  .  methylPREDNISolone (SOLU-MEDROL) injection  80 mg Intravenous Q12H  . oseltamivir  30 mg Oral BID  . pantoprazole  40 mg Oral Q24H  . potassium chloride  20 mEq Oral Daily  . simvastatin  40 mg Oral Once per day on Mon Wed Fri  . sodium chloride  3 mL Intravenous Q12H  . sodium chloride  3 mL Intravenous Q12H   Continuous Infusions: . heparin 1,200 Units/hr (01/30/14 2215)   PRN Meds:.acetaminophen, acetaminophen, temazepam  Assessment/Plan:  Principal Problem:  Acute respiratory failure - respiratory status stabilizing and oxygenation is improving. Respiratory failure is multifactorial. Interesting that he is positive for influenza A H3. Placed on Tamiflu. Concerned about narrowing of the right tracheobronchial tree seen on CT angiogram on admission. Could be recurrent tumor or stricture from radiation. Would push for evaluation by bronchoscopy during this hospitalization if possible.  Active Problems:  COPD with emphysema - we'll further reduce Solu-Medrol to 60 mg IV every 12 Influenza A H3 - positive serology. Tamiflu started 01/31/2014 at reduced dose secondary to renal insufficiency CARCINOMA, LUNG, SQUAMOUS CELL - bronchoscopy could contribute to information about recurrence or not  OSA (obstructive sleep apnea) - does not use at home  HCAP (healthcare-associated pneumonia) - on Maxipime and Levaquin and now on Tamiflu - will defer to pulmonary critical care for antibiotic therapy  T7 vertebral fracture - aware  Non-ST elevation MI (NSTEMI) - aware, as per cardiology - I would recommend further workup for coronary ischemia during this hospitalization if at all possible Anemia - hemoglobin stable at 11.7  Unspecified essential hypertension - blood pressure elevated - IV hydralazine when necessary. 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 Creatinine even higher today. We'll resume Lasix when creatinine stabilizes as per cardiology IV access - place PICC Disposition - patient is afraid to the ICU - doing well in stepdown   LOS: 5 days   [name], MD 02/01/2014, 8:11 AM

## 2014-02-01 NOTE — Progress Notes (Signed)
   Name: BENTON TOOKER MRN: [id number] DOB: February 23, 1941    ADMISSION DATE:  01/27/2014 CONSULTATION DATE:  01/28/2014  REFERRING MD :  [name] PRIMARY SERVICE:  Internal Medicine  CHIEF COMPLAINT:  SOB and cough  BRIEF PATIENT DESCRIPTION: 73 y.o. 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  [name] as outpatient, presents with worsening cough, wheezing, and SOB.  PCCM called for evaluation and assistance with medical care.  SIGNIFICANT EVENTS / STUDIES:  2/23 - admitted with SOB/cough, CXR suggestive of HCAP 2/23 ct chest>>>No embolus identified. Enlarging right upper paratracheal lymph node, 1.5 cm in short  axis, possibly reactive or malignant. Increasing airspace opacity at the inferior margin of the  remaining right upper lobe, nonspecific but potentially from radiation pneumonitis, pneumonia, or spread of tumor. 2/25 - worsening distress  LINES / TUBES: PIV  CULTURES: MRSA 2/24 >>> negative  ANTIBIOTICS: Zosyn 2/23 x 1 Cefepime 2/24 >>> Vancomycin 2/23 >>>  SUBJECTIVE:  Stable overnight, and clearly feels better.  Still with dyspnea with activity  VITAL SIGNS: Temp:  [97.1 F (36.2 C)-97.9 F (36.6 C)] 97.6 F (36.4 C) (02/28 0810) Pulse Rate:  [96-102] 97 (02/28 0356) Resp:  [20] 20 (02/27 1915) BP: (120-145)/(63-85) 120/63 mmHg (02/28 0810) SpO2:  [95 %-99 %] 99 % (02/28 0928) Weight:  [76.8 kg (169 lb 5 oz)] 76.8 kg (169 lb 5 oz) (02/28 0500)  PHYSICAL EXAMINATION: General: wd male nad  Neuro: A&O, moves all 4.  HEENT: nose without purulence or d/c, neck without LN or TMG Cardiovascular: RRR, no M/R/G.  Lungs: bronchial bs RLL, no active wheezing, a few crackles. Abdomen: BS x 4, soft, NT/ND.  Musculoskeletal: No gross deformities, no edema.     ASSESSMENT / PLAN:  Acute on Chronic Respiratory Failure - Ischemia, RLL infiltrate, COPD exacerbation, R airway compromise?  Influenza A +, now on tamiflu.  The pt appears to be  comfortable on nasal oxygen with no increased wob today.   Squamous Cell Lung Cancer:   - new progressive L paratracheal LAD, some R sided airway compromise    Plan: - Follow cx data, continue abx and anti viral - BiPAP PRN  -taper steroid dose  Will check again on Monday.  Please call if issues this weekend.

## 2014-02-01 NOTE — Progress Notes (Signed)
Patient ID: Todd Villanueva, male   DOB: November 10, 1941, 73 y.o.   MRN: [id number]     Subjective:   No events overnight. Some improvement in breathing but still not at baseline.    Objective:   Temp:  [97.1 F (36.2 C)-97.9 F (36.6 C)] 97.6 F (36.4 C) (02/28 0810) Pulse Rate:  [96-102] 97 (02/28 0356) Resp:  [20] 20 (02/27 1915) BP: (120-145)/(63-85) 120/63 mmHg (02/28 0810) SpO2:  [95 %-99 %] 99 % (02/28 0928) Weight:  [169 lb 5 oz (76.8 kg)] 169 lb 5 oz (76.8 kg) (02/28 0500) Last BM Date: 01/29/14  Filed Weights   01/30/14 0340 01/31/14 0439 02/01/14 0500  Weight: 174 lb 6.1 oz (79.1 kg) 169 lb 8.5 oz (76.9 kg) 169 lb 5 oz (76.8 kg)    Intake/Output Summary (Last 24 hours) at 02/01/14 1013 Last data filed at 02/01/14 0800  Gross per 24 hour  Intake 1110.2 ml  Output   1035 ml  Net   75.2 ml    Telemetry: sinus tach  Exam:  General:NAD  Resp:bilateral expiratory wheezing  Cardiac: RRR, no m/r/g,no JVD  FB:PZWCHEN soft, NT, ND  MSK: extremities are warm, no edema  Neuro: no focal deficits   Lab Results:  Basic Metabolic Panel:  Recent Labs Lab 01/30/14 0935 01/31/14 0223 02/01/14 0254  NA 146 135* 133*  K 3.7 4.0 4.4  CL 84* 92* 92*  CO2 20 25 21   GLUCOSE 174* 164* 169*  BUN 34* 51* 68*  CREATININE 1.59* 2.57* 2.84*  CALCIUM 8.2* 8.3* 8.3*    Liver Function Tests:  Recent Labs Lab 01/28/14 0510  AST 25  ALT 26  ALKPHOS 156*  BILITOT 0.4  PROT 6.2  ALBUMIN 2.3*    CBC:  Recent Labs Lab 01/30/14 0253 01/31/14 0223 02/01/14 0254  WBC 16.3* 15.3* 14.2*  HGB 11.9* 12.0* 11.7*  HCT 34.0* 33.8* 33.4*  MCV 92.1 90.4 91.5  PLT 158 192 195    Cardiac Enzymes:  Recent Labs Lab 01/28/14 0950 01/28/14 2058 01/29/14 0246  TROPONINI 1.31* 1.55* 1.28*    BNP:  Recent Labs  01/27/14 1740  PROBNP 2835.0*    Coagulation:  Recent Labs Lab 01/27/14 1740  INR 0.96    ECG:   Medications:   Scheduled Medications: .  ALPRAZolam  0.25 mg Oral TID  . aspirin EC  325 mg Oral Q24H  . budesonide (PULMICORT) nebulizer solution  0.5 mg Nebulization BID  . ceFEPime (MAXIPIME) IV  1 g Intravenous Q24H  . citalopram  20 mg Oral Q24H  . docusate sodium  100 mg Oral BID  . ferrous [id number] C-folic acid  1 capsule Oral Q24H  . ipratropium  0.5 mg Nebulization Q6H  . levalbuterol  0.63 mg Nebulization Q6H  . levofloxacin (LEVAQUIN) IV  500 mg Intravenous Q48H  . methylPREDNISolone (SOLU-MEDROL) injection  60 mg Intravenous Q12H  . oseltamivir  30 mg Oral BID  . pantoprazole  40 mg Oral Q24H  . potassium chloride  20 mEq Oral Daily  . simvastatin  40 mg Oral Once per day on Mon Wed Fri  . sodium chloride  3 mL Intravenous Q12H  . sodium chloride  3 mL Intravenous Q12H     Infusions: . heparin 1,200 Units/hr (01/30/14 2215)     PRN Medications:  acetaminophen, acetaminophen, temazepam     Assessment/Plan     1 Systolic heart failure - echo 01/28/14 shows LVEF 35-40%, down from 04/2013 when LVEF was 55-60% -  diuresed this admission, net negative toal 3.1 liters. Overall I/O even over last 24 hrs. Appears euvolemic to hypovolemic, not requiring any diuretics at this time.  - beta blocker has not been initiated per notes in setting of COPD exacerbation  2. NSTEMI - troponin peaked at 1.55, unclear how much is related to demand ischemia in setting of respiratory infection and hypoxia vs obstructive CAD. With decrease in LVEF he does need an ischemic evaluation, thus far AKI has been prohibitive.  - there is a listed of lung squamous cell CA described in the chart, this will need to be considered as well regarding possible ischemic evaluation/intervention.  - he is on ASA, statin. No beta in setting of active bronchospasm, no ACE in setting of AKI. Continue hep gtt.   3. COPD exacerbation -management per primary team  4. Pneumonia - abx per primary team, WBC is trending down.       Carlyle Dolly, M.D., F.A.C.C.

## 2014-02-02 ENCOUNTER — Inpatient Hospital Stay (HOSPITAL_COMMUNITY): Payer: 59

## 2014-02-02 LAB — CBC
HCT: 33.3 % — ABNORMAL LOW (ref 39.0–52.0)
HEMATOCRIT: 31.8 % — AB (ref 39.0–52.0)
HEMOGLOBIN: 11.3 g/dL — AB (ref 13.0–17.0)
Hemoglobin: 11.6 g/dL — ABNORMAL LOW (ref 13.0–17.0)
MCH: 31.8 pg (ref 26.0–34.0)
MCH: 32.4 pg (ref 26.0–34.0)
MCHC: 34.8 g/dL (ref 30.0–36.0)
MCHC: 35.5 g/dL (ref 30.0–36.0)
MCV: 91.2 fL (ref 78.0–100.0)
MCV: 91.1 fL (ref 78.0–100.0)
PLATELETS: 236 10*3/uL (ref 150–400)
Platelets: 259 10*3/uL (ref 150–400)
RBC: 3.65 MIL/uL — ABNORMAL LOW (ref 4.22–5.81)
RBC: 3.49 MIL/uL — ABNORMAL LOW (ref 4.22–5.81)
RDW: 14.7 % (ref 11.5–15.5)
RDW: 14.7 % (ref 11.5–15.5)
WBC: 15.1 10*3/uL — ABNORMAL HIGH (ref 4.0–10.5)
WBC: 15 10*3/uL — AB (ref 4.0–10.5)

## 2014-02-02 LAB — HEPARIN LEVEL (UNFRACTIONATED)
Heparin Unfractionated: 0.27 IU/mL — ABNORMAL LOW (ref 0.30–0.70)
Heparin Unfractionated: 0.63 IU/mL (ref 0.30–0.70)

## 2014-02-02 LAB — BASIC METABOLIC PANEL
BUN: 74 mg/dL — ABNORMAL HIGH (ref 6–23)
CALCIUM: 8.6 mg/dL (ref 8.4–10.5)
CO2: 23 mEq/L (ref 19–32)
Chloride: 94 mEq/L — ABNORMAL LOW (ref 96–112)
Creatinine, Ser: 2.62 mg/dL — ABNORMAL HIGH (ref 0.50–1.35)
GFR, EST AFRICAN AMERICAN: 27 mL/min — AB (ref >90)
GFR, EST NON AFRICAN AMERICAN: 23 mL/min — AB (ref >90)
Glucose, Bld: 133 mg/dL — ABNORMAL HIGH (ref 70–99)
Potassium: 4.8 mEq/L (ref 3.7–5.3)
Sodium: 138 mEq/L (ref 137–147)

## 2014-02-02 LAB — VANCOMYCIN, RANDOM: Vancomycin Rm: 24.7 ug/mL

## 2014-02-02 MED ORDER — SODIUM CHLORIDE 0.9 % IJ SOLN
10.0000 mL | Freq: Two times a day (BID) | INTRAMUSCULAR | Status: DC
  Administered 2014-02-02 – 2014-02-03 (×4): 10 mL

## 2014-02-02 MED ORDER — PANTOPRAZOLE SODIUM 40 MG PO TBEC
40.0000 mg | DELAYED_RELEASE_TABLET | Freq: Two times a day (BID) | ORAL | Status: DC
  Administered 2014-02-02 – 2014-02-05 (×6): 40 mg via ORAL
  Filled 2014-02-02 (×6): qty 1

## 2014-02-02 MED ORDER — ALUM & MAG HYDROXIDE-SIMETH 200-200-20 MG/5ML PO SUSP
15.0000 mL | Freq: Once | ORAL | Status: AC
  Administered 2014-02-02: 15 mL via ORAL
  Filled 2014-02-02: qty 30

## 2014-02-02 MED ORDER — ASPIRIN EC 81 MG PO TBEC
81.0000 mg | DELAYED_RELEASE_TABLET | ORAL | Status: DC
  Administered 2014-02-03 – 2014-02-05 (×3): 81 mg via ORAL
  Filled 2014-02-02 (×5): qty 1

## 2014-02-02 MED ORDER — SODIUM CHLORIDE 0.9 % IJ SOLN
10.0000 mL | INTRAMUSCULAR | Status: DC | PRN
  Administered 2014-02-03 – 2014-02-05 (×6): 10 mL

## 2014-02-02 MED ORDER — TRAMADOL HCL 50 MG PO TABS
100.0000 mg | ORAL_TABLET | Freq: Two times a day (BID) | ORAL | Status: DC | PRN
  Administered 2014-02-02 – 2014-02-05 (×2): 100 mg via ORAL
  Filled 2014-02-02 (×2): qty 2

## 2014-02-02 MED ORDER — MORPHINE SULFATE 2 MG/ML IJ SOLN
1.0000 mg | INTRAMUSCULAR | Status: DC | PRN
Start: 2014-02-02 — End: 2014-02-05

## 2014-02-02 NOTE — Progress Notes (Signed)
ANTICOAGULATION CONSULT NOTE - Follow Up Consult  Pharmacy Consult for heparin Indication: NSTEMI  Allergies  Allergen Reactions  . Hydrocodone-Acetaminophen     REACTION: Itch  . Oxycodone Itching  . Tramadol     Pt states will not take med b/c it does not work   . Zolpidem Tartrate Other (See Comments)    nightmares    Patient Measurements: Height: 5\' 7"  (170.2 cm) Weight: 169 lb 12.1 oz (77 kg) IBW/kg (Calculated) : 66.1 Heparin Dosing Weight:   Vital Signs: Temp: 98 F (36.7 C) (03/01 1230) Temp src: Oral (03/01 1230) BP: 132/96 mmHg (03/01 1230) Pulse Rate: 101 (03/01 1230)  Labs:  Recent Labs  01/31/14 0223 02/01/14 0254 02/02/14 0316 02/02/14 1230  HGB 12.0* 11.7* 11.6*  --   HCT 33.8* 33.4* 33.3*  --   PLT 192 195 236  --   HEPARINUNFRC 0.60 0.55 0.27* 0.63  CREATININE 2.57* 2.84* 2.62*  --     Estimated Creatinine Clearance: 24.5 ml/min (by C-G formula based on Cr of 2.62).   Medications:  Scheduled:  . ALPRAZolam  0.25 mg Oral TID  . aspirin EC  81 mg Oral Q24H  . budesonide (PULMICORT) nebulizer solution  0.5 mg Nebulization BID  . ceFEPime (MAXIPIME) IV  1 g Intravenous Q24H  . citalopram  20 mg Oral Q24H  . docusate sodium  100 mg Oral BID  . ferrous [id number] C-folic acid  1 capsule Oral Q24H  . ipratropium  0.5 mg Nebulization Q6H  . levalbuterol  0.63 mg Nebulization Q6H  . levofloxacin (LEVAQUIN) IV  500 mg Intravenous Q48H  . methylPREDNISolone (SOLU-MEDROL) injection  40 mg Intravenous Q12H  . oseltamivir  30 mg Oral BID  . pantoprazole  40 mg Oral BID  . potassium chloride  20 mEq Oral Daily  . simvastatin  40 mg Oral Once per day on Mon Wed Fri  . sodium chloride  10-40 mL Intracatheter Q12H  . sodium chloride  3 mL Intravenous Q12H  . sodium chloride  3 mL Intravenous Q12H   Infusions:  . heparin 1,300 Units/hr (02/02/14 0506)    Assessment: 73 male with NSTEMI is currently on therapeutic heparin.  Heparin level  is 0.63 Goal of Therapy:  Heparin level 0.3-0.7 units/ml Monitor platelets by anticoagulation protocol: Yes   Plan:  1) Continue heparin at 1300 units/hr 2) Heparin level and CBC in am  Pritesh Sobecki, Tsz-Yin 02/02/2014,4:16 PM

## 2014-02-02 NOTE — Progress Notes (Signed)
CT abd result called to Dr. [name]. New order received. Report given to incoming RN

## 2014-02-02 NOTE — Progress Notes (Signed)
Pt. Refused his breathing treatments. Pt. Feels that the treatments Are not helping his condition at this time. RT informed pt. To notify if he needs anything.

## 2014-02-02 NOTE — Progress Notes (Signed)
Pt is complaining of severe Left upper quadrant abdominal pain. The site is "swollen," firm, and tender to touch. MD is notified. Order received for a CT of the abdomen.  Cardiology also notified and order received to stop heparin drip.

## 2014-02-02 NOTE — Progress Notes (Signed)
Peripherally Inserted Central Catheter/Midline Placement  The IV Nurse has discussed with the patient and/or persons authorized to consent for the patient, the purpose of this procedure and the potential benefits and risks involved with this procedure.  The benefits include less needle sticks, lab draws from the catheter and patient may be discharged home with the catheter.  Risks include, but not limited to, infection, bleeding, blood clot (thrombus formation), and puncture of an artery; nerve damage and irregular heat beat.  Alternatives to this procedure were also discussed.  PICC/Midline Placement Documentation  PICC / Midline Double Lumen 02/02/14 PICC Right Brachial 35 cm 0 cm (Active)  Indication for Insertion or Continuance of Line Prolonged intravenous therapies 02/02/2014 10:00 AM  Exposed Catheter (cm) 0 cm 02/02/2014 10:00 AM  Site Assessment Clean;Dry;Intact 02/02/2014 10:00 AM  Lumen #1 Status Flushed;Saline locked;Blood return noted 02/02/2014 10:00 AM  Lumen #2 Status Flushed;Saline locked;Blood return noted 02/02/2014 10:00 AM  Dressing Type Transparent 02/02/2014 10:00 AM  Dressing Status Clean;Dry;Intact;Antimicrobial disc in place 02/02/2014 10:00 AM  Line Care Connections checked and tightened 02/02/2014 10:00 AM  Dressing Intervention New dressing 02/02/2014 10:00 AM  Dressing Change Due 02/09/14 02/02/2014 10:00 AM       Rolena Infante 02/02/2014, 10:01 AM

## 2014-02-02 NOTE — Progress Notes (Addendum)
Subjective: Feeling much better overall. No chest pains. Concern now is for coronary ischemia and also for possible recurrence of lung cancer on the right with narrowing of his right tracheaobronchial tree. Renal failure is improving. Will likely need to resume a diuretic soon. We need recovery of renal function for possible cardiac catheterization  Objective: Weight change: 7.1 oz (0.2 kg)  Intake/Output Summary (Last 24 hours) at 02/02/14 0915 Last data filed at 02/02/14 0745  Gross per 24 hour  Intake    624 ml  Output   1300 ml  Net   -676 ml   Filed Vitals:   02/02/14 0019 02/02/14 0107 02/02/14 0402 02/02/14 0746  BP: 135/87  161/99 144/91  Pulse: 93  95   Temp: 97.9 F (36.6 C)  97.8 F (36.6 C) 97.5 F (36.4 C)  TempSrc: Oral  Axillary Oral  Resp:      Height:      Weight:   169 lb 12.1 oz (77 kg)   SpO2: 100% 95% 98%     General Appearance: Alert, cooperative, no distress, appears stated age  Patient currently having PICC placed and did not want to break sterile field Heart rate 110 on monitor, sinus tach    Lab Results: Results for orders placed during the hospital encounter of 01/27/14 (from the past 48 hour(s))  VANCOMYCIN, TROUGH     Status: Abnormal   Collection Time    01/31/14  6:58 PM      Result Value Ref Range   Vancomycin Tr 36.8 (*) 10.0 - 20.0 ug/mL   Comment: CRITICAL RESULT CALLED TO, READ BACK BY AND VERIFIED WITH:     Kennon Portela 01/31/14 2013 SH[facility] M  HEPARIN LEVEL (UNFRACTIONATED)     Status: None   Collection Time    02/01/14  2:54 AM      Result Value Ref Range   Heparin Unfractionated 0.55  0.30 - 0.70 IU/mL   Comment:            IF HEPARIN RESULTS ARE BELOW     EXPECTED VALUES, AND PATIENT     DOSAGE HAS BEEN CONFIRMED,     SUGGEST FOLLOW UP TESTING     OF ANTITHROMBIN III LEVELS.  CBC     Status: Abnormal   Collection Time    02/01/14  2:54 AM      Result Value Ref Range   WBC 14.2 (*) 4.0 - 10.5 K/uL   RBC 3.65 (*)  4.22 - 5.81 MIL/uL   Hemoglobin 11.7 (*) 13.0 - 17.0 g/dL   HCT 33.4 (*) 39.0 - 52.0 %   MCV 91.5  78.0 - 100.0 fL   MCH 32.1  26.0 - 34.0 pg   MCHC 35.0  30.0 - 36.0 g/dL   RDW 14.6  11.5 - 15.5 %   Platelets 195  150 - 400 K/uL  BASIC METABOLIC PANEL     Status: Abnormal   Collection Time    02/01/14  2:54 AM      Result Value Ref Range   Sodium 133 (*) 137 - 147 mEq/L   Potassium 4.4  3.7 - 5.3 mEq/L   Chloride 92 (*) 96 - 112 mEq/L   CO2 21  19 - 32 mEq/L   Glucose, Bld 169 (*) 70 - 99 mg/dL   BUN 68 (*) 6 - 23 mg/dL   Creatinine, Ser 2.84 (*) 0.50 - 1.35 mg/dL   Calcium 8.3 (*) 8.4 - 10.5 mg/dL   GFR  calc non Af Amer 21 (*) >90 mL/min   GFR calc Af Amer 24 (*) >90 mL/min   Comment: (NOTE)     The eGFR has been calculated using the CKD EPI equation.     This calculation has not been validated in all clinical situations.     eGFR's persistently <90 mL/min signify possible Chronic Kidney     Disease.  VANCOMYCIN, RANDOM     Status: None   Collection Time    02/01/14 12:29 PM      Result Value Ref Range   Vancomycin Rm 25.8     Comment:            Random Vancomycin therapeutic     range is dependent on dosage and     time of specimen collection.     A peak range is 20.0-40.0 ug/mL     A trough range is 5.0-15.0 ug/mL             HEPARIN LEVEL (UNFRACTIONATED)     Status: Abnormal   Collection Time    02/02/14  3:16 AM      Result Value Ref Range   Heparin Unfractionated 0.27 (*) 0.30 - 0.70 IU/mL   Comment:            IF HEPARIN RESULTS ARE BELOW     EXPECTED VALUES, AND PATIENT     DOSAGE HAS BEEN CONFIRMED,     SUGGEST FOLLOW UP TESTING     OF ANTITHROMBIN III LEVELS.  CBC     Status: Abnormal   Collection Time    02/02/14  3:16 AM      Result Value Ref Range   WBC 15.1 (*) 4.0 - 10.5 K/uL   RBC 3.65 (*) 4.22 - 5.81 MIL/uL   Hemoglobin 11.6 (*) 13.0 - 17.0 g/dL   HCT 33.3 (*) 39.0 - 52.0 %   MCV 91.2  78.0 - 100.0 fL   MCH 31.8  26.0 - 34.0 pg   MCHC  34.8  30.0 - 36.0 g/dL   RDW 14.7  11.5 - 15.5 %   Platelets 236  150 - 400 K/uL  BASIC METABOLIC PANEL     Status: Abnormal   Collection Time    02/02/14  3:16 AM      Result Value Ref Range   Sodium 138  137 - 147 mEq/L   Potassium 4.8  3.7 - 5.3 mEq/L   Chloride 94 (*) 96 - 112 mEq/L   CO2 23  19 - 32 mEq/L   Glucose, Bld 133 (*) 70 - 99 mg/dL   BUN 74 (*) 6 - 23 mg/dL   Creatinine, Ser 2.62 (*) 0.50 - 1.35 mg/dL   Calcium 8.6  8.4 - 10.5 mg/dL   GFR calc non Af Amer 23 (*) >90 mL/min   GFR calc Af Amer 27 (*) >90 mL/min   Comment: (NOTE)     The eGFR has been calculated using the CKD EPI equation.     This calculation has not been validated in all clinical situations.     eGFR's persistently <90 mL/min signify possible Chronic Kidney     Disease.  VANCOMYCIN, RANDOM     Status: None   Collection Time    02/02/14  3:16 AM      Result Value Ref Range   Vancomycin Rm 24.7     Comment:            Random Vancomycin therapeutic  range is dependent on dosage and     time of specimen collection.     A peak range is 20.0-40.0 ug/mL     A trough range is 5.0-15.0 ug/mL               Studies/Results: Dg Chest Port 1 View  02/01/2014   CLINICAL DATA:  Respiratory failure. History of right lung carcinoma and lobectomy.  EXAM: PORTABLE CHEST - 1 VIEW  COMPARISON:  01/30/2014  FINDINGS: Airspace disease noted predominately on the left has significantly improved. There is and mild residual opacity at the left lung base.  Right pleural effusion obscures most of the right heart border and the right hemidiaphragm. Volume loss on the right is stable.  Cardiac silhouette is mostly obscured. Mediastinum is normal in contour.  IMPRESSION: Significantly improved lung aeration when compared to the recent prior study. This rapid improvement suggests that the lung opacity was due to pulmonary edema. Right pleural effusion is similar to the prior exam.   Electronically Signed   By: Lajean Manes  M.D.   On: 02/01/2014 19:57   Medications: Scheduled Meds: . ALPRAZolam  0.25 mg Oral TID  . aspirin EC  81 mg Oral Q24H  . budesonide (PULMICORT) nebulizer solution  0.5 mg Nebulization BID  . ceFEPime (MAXIPIME) IV  1 g Intravenous Q24H  . citalopram  20 mg Oral Q24H  . docusate sodium  100 mg Oral BID  . ferrous [id number] C-folic acid  1 capsule Oral Q24H  . ipratropium  0.5 mg Nebulization Q6H  . levalbuterol  0.63 mg Nebulization Q6H  . levofloxacin (LEVAQUIN) IV  500 mg Intravenous Q48H  . methylPREDNISolone (SOLU-MEDROL) injection  40 mg Intravenous Q12H  . oseltamivir  30 mg Oral BID  . pantoprazole  40 mg Oral Q24H  . potassium chloride  20 mEq Oral Daily  . simvastatin  40 mg Oral Once per day on Mon Wed Fri  . sodium chloride  3 mL Intravenous Q12H  . sodium chloride  3 mL Intravenous Q12H   Continuous Infusions: . heparin 1,300 Units/hr (02/02/14 0506)   PRN Meds:.acetaminophen, acetaminophen, temazepam  Assessment/Plan:  Principal Problem:  Acute respiratory failure - respiratory status stabilizing and oxygenation is continuing to improve. Respiratory failure likely was multifactorial. Interesting that he is positive for influenza A H3. Continues on Tamiflu. Concerned about narrowing of the right tracheobronchial tree seen on CT angiogram on admission. Could be recurrent tumor or stricture from radiation. Would push for evaluation by bronchoscopy during this hospitalization if possible.  Active Problems:  COPD with emphysema - continue Solu-Medrol to 40 mg IV every 12 - likely transition to by mouth prednisone tomorrow Influenza A H3 - positive serology. Tamiflu started 01/31/2014 at reduced dose secondary to renal insufficiency  CARCINOMA, LUNG, SQUAMOUS CELL - bronchoscopy could contribute to information about recurrence or not  OSA (obstructive sleep apnea) - does not use at home  HCAP (healthcare-associated pneumonia) - on Maxipime and Levaquin and now  on Tamiflu - will defer to pulmonary critical care for antibiotic therapy  T7 vertebral fracture - aware  Non-ST elevation MI (NSTEMI) - aware, as per cardiology - I would recommend further workup for coronary ischemia during this hospitalization if at all possible  Anemia - hemoglobin stable at 11.7  Unspecified essential hypertension - blood pressure elevated - IV hydralazine when necessary. 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 Creatinine improving. We'll resume Lasix when creatinine stabilizes as per cardiology  IV access -  PICC placement today Disposition - patient is afraid to the ICU - doing well in stepdown  Appreciate help from [facility] and cardiology   LOS: 6 days   [name], MD 02/02/2014, 9:15 AM

## 2014-02-02 NOTE — Progress Notes (Signed)
ANTICOAGULATION CONSULT NOTE - Follow Up Consult  Pharmacy Consult for heparin Indication: NSTEMI  Labs:  Recent Labs  01/30/14 0935  01/31/14 0223 02/01/14 0254 02/02/14 0316  HGB  --   < > 12.0* 11.7* 11.6*  HCT  --   --  33.8* 33.4* 33.3*  PLT  --   --  192 195 236  HEPARINUNFRC  --   --  0.60 0.55 0.27*  CREATININE 1.59*  --  2.57* 2.84*  --   < > = values in this interval not displayed.   Assessment: 73yo male now subtherapeutic on heparin after two levels and goal though trending down.  Goal of Therapy:  Heparin level 0.3-0.7 units/ml   Plan:  Will increase heparin gtt by 1-2 units/kg/hr to 1300 units/hr and check level in [place], PharmD, BCPS  02/02/2014,4:32 AM

## 2014-02-02 NOTE — Progress Notes (Addendum)
Patient ID: Todd Villanueva, male   DOB: [birth year], 73 y.o.   MRN: [id number]     Subjective:    Breathing is improving, trial off nasal canula this morning.   Objective:   Temp:  [97.1 F (36.2 C)-97.9 F (36.6 C)] 97.8 F (36.6 C) (03/01 0402) Pulse Rate:  [93-106] 95 (03/01 0402) BP: (120-161)/(63-99) 161/99 mmHg (03/01 0402) SpO2:  [90 %-100 %] 98 % (03/01 0402) Weight:  [169 lb 12.1 oz (77 kg)] 169 lb 12.1 oz (77 kg) (03/01 0402) Last BM Date: 01/29/14  Filed Weights   01/31/14 0439 02/01/14 0500 02/02/14 0402  Weight: 169 lb 8.5 oz (76.9 kg) 169 lb 5 oz (76.8 kg) 169 lb 12.1 oz (77 kg)    Intake/Output Summary (Last 24 hours) at 02/02/14 0753 Last data filed at 02/02/14 0745  Gross per 24 hour  Intake    888 ml  Output   1300 ml  Net   -412 ml    Telemetry: NSR  Exam:  General:NAD  Resp: CTAB  Cardiac: RRR, no m/r/g, no JVD, no carotid bruits  GI: abdomen soft, NT, ND  MSK: LE warm, no edema  Neuro: no focal deficits   Lab Results:  Basic Metabolic Panel:  Recent Labs Lab 01/31/14 0223 02/01/14 0254 02/02/14 0316  NA 135* 133* 138  K 4.0 4.4 4.8  CL 92* 92* 94*  CO2 25 21 23   GLUCOSE 164* 169* 133*  BUN 51* 68* 74*  CREATININE 2.57* 2.84* 2.62*  CALCIUM 8.3* 8.3* 8.6    Liver Function Tests:  Recent Labs Lab 01/28/14 0510  AST 25  ALT 26  ALKPHOS 156*  BILITOT 0.4  PROT 6.2  ALBUMIN 2.3*    CBC:  Recent Labs Lab 01/31/14 0223 02/01/14 0254 02/02/14 0316  WBC 15.3* 14.2* 15.1*  HGB 12.0* 11.7* 11.6*  HCT 33.8* 33.4* 33.3*  MCV 90.4 91.5 91.2  PLT 192 195 236    Cardiac Enzymes:  Recent Labs Lab 01/28/14 0950 01/28/14 2058 01/29/14 0246  TROPONINI 1.31* 1.55* 1.28*    BNP:  Recent Labs  01/27/14 1740  PROBNP 2835.0*    Coagulation:  Recent Labs Lab 01/27/14 1740  INR 0.96    ECG:   Medications:   Scheduled Medications: . ALPRAZolam  0.25 mg Oral TID  . aspirin EC  325 mg Oral Q24H  .  budesonide (PULMICORT) nebulizer solution  0.5 mg Nebulization BID  . ceFEPime (MAXIPIME) IV  1 g Intravenous Q24H  . citalopram  20 mg Oral Q24H  . docusate sodium  100 mg Oral BID  . ferrous [id number] C-folic acid  1 capsule Oral Q24H  . ipratropium  0.5 mg Nebulization Q6H  . levalbuterol  0.63 mg Nebulization Q6H  . levofloxacin (LEVAQUIN) IV  500 mg Intravenous Q48H  . methylPREDNISolone (SOLU-MEDROL) injection  40 mg Intravenous Q12H  . oseltamivir  30 mg Oral BID  . pantoprazole  40 mg Oral Q24H  . potassium chloride  20 mEq Oral Daily  . simvastatin  40 mg Oral Once per day on Mon Wed Fri  . sodium chloride  3 mL Intravenous Q12H  . sodium chloride  3 mL Intravenous Q12H     Infusions: . heparin 1,300 Units/hr (02/02/14 0506)     PRN Medications:  acetaminophen, acetaminophen, temazepam     Assessment/Plan    1 Systolic heart failure  - echo 01/28/14 shows LVEF 35-40%, down from 04/2013 when LVEF was 55-60%  - diuresed this  admission, net negative toal 3.7 liters. Overall I/O even over last 24 hrs. Appears euvolemic to hypovolemic, not requiring any diuretics at this time. Cr is trending down.  - beta blocker has not been initiated per notes in setting of COPD exacerbation. No ACE-I in setting of AKI.   2. NSTEMI  - troponin peaked at 1.55, unclear how much is related to demand ischemia in setting of respiratory infection and hypoxia vs obstructive CAD. With decrease in LVEF he does need an ischemic evaluation, thus far AKI has been prohibitive.  - there is a listed of lung squamous cell CA described in the chart, this will need to be considered as well regarding possible ischemic evaluation/intervention.  - he is on ASA, statin. No beta in setting of active bronchospasm per notes, no ACE in setting of AKI. Continue hep gtt.   3. COPD exacerbation  -management per primary team, symptoms improving  4. Pneumonia  - abx per primary team, flu + on tamiflu.  WBC is trending down.  - WBC remains elevated, afebrile    Carlyle Dolly, M.D., F.A.C.C.  02/02/14 Addendum 425 pm  Contacted by primary team, concerned about possible abdominal hematoma. Asked if ok to stop heparin. Patient is s/p NSTEMI, troponins have trended down and he has not had any recurrent chest pain. He has completed several days of heparin therapy, and is awaiting ischemic evaluation in setting of NSTEMI and newly diagnosed systolic dysfunction. Ok to stop heparin, do not need to resume at this point.  Carlyle Dolly MD

## 2014-02-03 DIAGNOSIS — D72829 Elevated white blood cell count, unspecified: Secondary | ICD-10-CM | POA: Diagnosis present

## 2014-02-03 DIAGNOSIS — IMO0002 Reserved for concepts with insufficient information to code with codable children: Secondary | ICD-10-CM | POA: Diagnosis not present

## 2014-02-03 DIAGNOSIS — C3490 Malignant neoplasm of unspecified part of unspecified bronchus or lung: Secondary | ICD-10-CM

## 2014-02-03 LAB — CBC
HCT: 31 % — ABNORMAL LOW (ref 39.0–52.0)
Hemoglobin: 10.9 g/dL — ABNORMAL LOW (ref 13.0–17.0)
MCH: 32.3 pg (ref 26.0–34.0)
MCHC: 35.2 g/dL (ref 30.0–36.0)
MCV: 92 fL (ref 78.0–100.0)
PLATELETS: 265 10*3/uL (ref 150–400)
RBC: 3.37 MIL/uL — ABNORMAL LOW (ref 4.22–5.81)
RDW: 14.8 % (ref 11.5–15.5)
WBC: 17.5 10*3/uL — AB (ref 4.0–10.5)

## 2014-02-03 LAB — BASIC METABOLIC PANEL
BUN: 76 mg/dL — ABNORMAL HIGH (ref 6–23)
CHLORIDE: 94 meq/L — AB (ref 96–112)
CO2: 27 mEq/L (ref 19–32)
Calcium: 9.1 mg/dL (ref 8.4–10.5)
Creatinine, Ser: 2.35 mg/dL — ABNORMAL HIGH (ref 0.50–1.35)
GFR calc non Af Amer: 26 mL/min — ABNORMAL LOW (ref >90)
GFR, EST AFRICAN AMERICAN: 31 mL/min — AB (ref >90)
Glucose, Bld: 96 mg/dL (ref 70–99)
POTASSIUM: 4.8 meq/L (ref 3.7–5.3)
Sodium: 134 mEq/L — ABNORMAL LOW (ref 137–147)

## 2014-02-03 LAB — HEPARIN LEVEL (UNFRACTIONATED)

## 2014-02-03 MED ORDER — LEVOFLOXACIN 500 MG PO TABS
500.0000 mg | ORAL_TABLET | ORAL | Status: DC
  Administered 2014-02-04: 500 mg via ORAL
  Filled 2014-02-03: qty 1

## 2014-02-03 MED ORDER — PREDNISONE 50 MG PO TABS
50.0000 mg | ORAL_TABLET | Freq: Every day | ORAL | Status: DC
  Administered 2014-02-03 – 2014-02-04 (×2): 50 mg via ORAL
  Filled 2014-02-03 (×3): qty 1

## 2014-02-03 NOTE — Progress Notes (Signed)
Name: Todd Villanueva MRN: [id number] DOB: 13-Jun-1941    ADMISSION DATE:  01/27/2014 CONSULTATION DATE:  01/28/2014  REFERRING MD :  [name]  CHIEF COMPLAINT:  SOB and cough  BRIEF PATIENT DESCRIPTION:  73 yo male admitted with cough, wheeze, and dyspnea.  He is followed by [name] for COPD and hs of NSCLC s/p Rt VATS and chemo.  SIGNIFICANT EVENTS: 2/23 Admit 2/24 Oncology consulted for abnormal CT chest, Cardiology consulted for elevated troponins 2/25 Acute respiratory distress from pulmonary edema, transfer to ICU 3/01 Abdominal pain >> Lt rectus muscle hematoma  STUDIES:  04/19/12 PFT >> FEV1 1.76 (65%), FEV1% 69, TLC 6.01 (70%), DLCO 37% 2/23 CT chest >> 1.5 cm Rt paratracheal LAN, ASD Rt lung, emphysema, Rt pleural thickening with effusion 2/24 Echo >> EF 35 to 08%, grade 1 diastolic dysfx, mild LA dilation 3/01 CT abd/pelvis >> Lt abdominal wall rectus muscle hematoma  LINES / TUBES: PIV  CULTURES: RSV 2/25 >> influenza A  ANTIBIOTICS: Zosyn 2/23 x 1 Cefepime 2/23 >>> Vancomycin 2/23 >>> 2/26 Levaquin 2/25 >>  Tamiflu 2/27 >>   SUBJECTIVE:  Breathing much better.  Denies chest pain, or cough.  VITAL SIGNS: Temp:  [97.4 F (36.3 C)-98.2 F (36.8 C)] 97.5 F (36.4 C) (03/02 1300) Pulse Rate:  [102-113] 111 (03/02 0937) Resp:  [16] 16 (03/02 0937) BP: (105-140)/(72-86) 124/86 mmHg (03/02 0937) SpO2:  [90 %-100 %] 91 % (03/02 1300) Weight:  [169 lb 12.1 oz (77 kg)] 169 lb 12.1 oz (77 kg) (03/02 0429)  PHYSICAL EXAMINATION: General: no distress, sitting in chair Neuro: normal strength HEENT: no sinus tenderness Cardiovascular: regular Lungs: decreased breath sounds Rt base, no wheeze Abdomen: soft Musculoskeletal: no edema  CBC Recent Labs     02/02/14  0316  02/02/14  2000  02/03/14  0500  WBC  15.1*  15.0*  17.5*  HGB  11.6*  11.3*  10.9*  HCT  33.3*  31.8*  31.0*  PLT  236  259  265   BMET Recent Labs     02/01/14  0254  02/02/14  0316   02/03/14  0500  NA  133*  138  134*  K  4.4  4.8  4.8  CL  92*  94*  94*  CO2  21  23  27   BUN  68*  74*  76*  CREATININE  2.84*  2.62*  2.35*  GLUCOSE  169*  133*  96    Electrolytes Recent Labs     02/01/14  0254  02/02/14  0316  02/03/14  0500  CALCIUM  8.3*  8.6  9.1   Imaging Ct Abdomen Pelvis Wo Contrast  02/02/2014   CLINICAL DATA:  Abdominal pain, mass, possible abdominal hematoma  EXAM: CT ABDOMEN AND PELVIS WITHOUT CONTRAST  TECHNIQUE: Multidetector CT imaging of the abdomen and pelvis was performed following the standard protocol without intravenous contrast.  COMPARISON:  05/10/2011  FINDINGS: Right posterior rib fractures are noted. Fibrotic changes and probable chronic interstitial prominence noted lung bases. There is atelectasis or infiltrate in right lower lobe posteriorly. There are postsurgical changes and volume loss in right lower lobe.  Study is limited without IV contrast. Unenhanced liver, spleen and adrenals are unremarkable. Mild pancreatic atrophy. No calcified gallstones are noted within gallbladder.  Atherosclerotic calcifications of abdominal aorta and iliac arteries. Atherosclerotic calcifications of SMA. Unenhanced kidneys are symmetrical in size. Bilateral mild perinephric stranding left greater than right. No nephrolithiasis. No hydronephrosis or  hydroureter.  Moderate colonic stool. No retroperitoneal hematoma. No mesenteric fluid collection. No mesenteric fluid collection. Consistent with history in left abdominal wall along the rectus muscle there is a large hematoma with fluid fluid level containing layering blood products. The hematoma is best visualized in coronal image 17 measures 7.7 cm transverse by 14.5 cranial caudally. There is some mass effect on omental fat in left abdomen with inner bulge of left abdominal wall.  The urinary bladder is unremarkable. Prostate gland and seminal vesicles are unremarkable. No inguinal adenopathy. No destructive bony  lesions are noted within pelvis.  Sagittal images of the spine shows degenerative changes lower lumbar spine. Significant disc space flattening with vacuum disc phenomenon anterior and posterior spurring at L4-L5 and L5-S1 level.  IMPRESSION: 1. Large intramuscular hematoma left abdominal wall left rectus muscle measures at least 14.5 cm x 7.7 cm. There is fluid fluid level with layering hyperdense blood products. 2. Atherosclerotic calcifications of abdominal aorta SMA and iliac arteries. 3. Moderate colonic stool. 4. No retroperitoneal hematoma. 5. There is atelectasis or infiltrate in right lower lobe posteromedially. Postsurgical changes are noted right base posteromedially. 6. Degenerative changes lumbar spine. Critical findings  discussed with patient's nurse.   Electronically Signed   By: Lahoma Crocker M.D.   On: 02/02/2014 18:48   Dg Chest Port 1 View  02/01/2014   CLINICAL DATA:  Respiratory failure. History of right lung carcinoma and lobectomy.  EXAM: PORTABLE CHEST - 1 VIEW  COMPARISON:  01/30/2014  FINDINGS: Airspace disease noted predominately on the left has significantly improved. There is and mild residual opacity at the left lung base.  Right pleural effusion obscures most of the right heart border and the right hemidiaphragm. Volume loss on the right is stable.  Cardiac silhouette is mostly obscured. Mediastinum is normal in contour.  IMPRESSION: Significantly improved lung aeration when compared to the recent prior study. This rapid improvement suggests that the lung opacity was due to pulmonary edema. Right pleural effusion is similar to the prior exam.   Electronically Signed   By: Lajean Manes M.D.   On: 02/01/2014 19:57          ASSESSMENT / PLAN: A: Acute respiratory failure 2nd to influenza A, pneumonia, AECOPD and CHF. P: -wean prednisone as tolerated -continue pulmicort, atrovent, xopenex (had episodes of SVT earlier) -f/u CXR as needed -Abx, tamiflu per primary  team  A: Hx of lung cancer with prominent Rt paratracheal nodes and Rt pleural enhancement. P: -will need f/u CT chest in several weeks after recovery from acute illness -defer bronchoscopy for now  A: Acute on chronic systolic/diastolic CHF. NSTEMI with hx of CAD, HTN. P: -per cardiology and primary team >> will need cath as some point  A: Rectus muscle hematoma developed 3/01. P: -per primary team  A: Acute kidney injury >> improving. P: -f/u BMET  [name], MD [facility] Pulmonary/Critical Care 02/03/2014, 1:57 PM Pager:  [phone] After 3pm call: [phone]

## 2014-02-03 NOTE — Progress Notes (Addendum)
Subjective: Todd Villanueva developed a rectus sheath hematoma yesterday and IV heparin had to be stopped. Feeling much better overall. Will change steroids to oral. Could probably switch over to oral antibiotic therapy but will defer to pulmonary critical care for choice of antibiotic. We'll prefer further pulmonary and cardiac workup to occur while hospitalized if at all possible. Rectus sheath hematoma may delay workups however.  Objective: Weight change: 0 lb (0 kg)  Intake/Output Summary (Last 24 hours) at 02/03/14 0728 Last data filed at 02/03/14 0431  Gross per 24 hour  Intake    695 ml  Output    920 ml  Net   -225 ml   Filed Vitals:   02/02/14 1600 02/02/14 2000 02/03/14 0012 02/03/14 0429  BP: 140/81  106/72 105/76  Pulse: 104  113 103  Temp: 98.2 F (36.8 C) 97.8 F (36.6 C) 97.5 F (36.4 C) 97.6 F (36.4 C)  TempSrc: Oral Oral Oral Oral  Resp:      Height:      Weight:    169 lb 12.1 oz (77 kg)  SpO2: 100%  97% 97%    General Appearance: Alert, cooperative, no distress, appears stated age  Lungs:  Breath sounds on right improved in upper lobe, no wheezes currently  Heart: Regular rhythm, mild rate increase S1 and S2 normal, no murmur, rub or gallop  Abdomen: Left upper quadrant with changes consistent with rectus sheath hematoma as per history of present illness.  Extremities: Extremities normal, atraumatic, no cyanosis or edema  Neuro: Oriented x3, nonfocal, moves all extremities well, no dysarthria  Lab Results: Results for orders placed during the hospital encounter of 01/27/14 (from the past 48 hour(s))  VANCOMYCIN, RANDOM     Status: None   Collection Time    02/01/14 12:29 PM      Result Value Ref Range   Vancomycin Rm 25.8     Comment:            Random Vancomycin therapeutic     range is dependent on dosage and     time of specimen collection.     A peak range is 20.0-40.0 ug/mL     A trough range is 5.0-15.0 ug/mL             HEPARIN LEVEL (UNFRACTIONATED)      Status: Abnormal   Collection Time    02/02/14  3:16 AM      Result Value Ref Range   Heparin Unfractionated 0.27 (*) 0.30 - 0.70 IU/mL   Comment:            IF HEPARIN RESULTS ARE BELOW     EXPECTED VALUES, AND PATIENT     DOSAGE HAS BEEN CONFIRMED,     SUGGEST FOLLOW UP TESTING     OF ANTITHROMBIN III LEVELS.  CBC     Status: Abnormal   Collection Time    02/02/14  3:16 AM      Result Value Ref Range   WBC 15.1 (*) 4.0 - 10.5 K/uL   RBC 3.65 (*) 4.22 - 5.81 MIL/uL   Hemoglobin 11.6 (*) 13.0 - 17.0 g/dL   HCT 33.3 (*) 39.0 - 52.0 %   MCV 91.2  78.0 - 100.0 fL   MCH 31.8  26.0 - 34.0 pg   MCHC 34.8  30.0 - 36.0 g/dL   RDW 14.7  11.5 - 15.5 %   Platelets 236  150 - 400 K/uL  BASIC METABOLIC PANEL  Status: Abnormal   Collection Time    02/02/14  3:16 AM      Result Value Ref Range   Sodium 138  137 - 147 mEq/L   Potassium 4.8  3.7 - 5.3 mEq/L   Chloride 94 (*) 96 - 112 mEq/L   CO2 23  19 - 32 mEq/L   Glucose, Bld 133 (*) 70 - 99 mg/dL   BUN 74 (*) 6 - 23 mg/dL   Creatinine, Ser 2.62 (*) 0.50 - 1.35 mg/dL   Calcium 8.6  8.4 - 10.5 mg/dL   GFR calc non Af Amer 23 (*) >90 mL/min   GFR calc Af Amer 27 (*) >90 mL/min   Comment: (NOTE)     The eGFR has been calculated using the CKD EPI equation.     This calculation has not been validated in all clinical situations.     eGFR's persistently <90 mL/min signify possible Chronic Kidney     Disease.  VANCOMYCIN, RANDOM     Status: None   Collection Time    02/02/14  3:16 AM      Result Value Ref Range   Vancomycin Rm 24.7     Comment:            Random Vancomycin therapeutic     range is dependent on dosage and     time of specimen collection.     A peak range is 20.0-40.0 ug/mL     A trough range is 5.0-15.0 ug/mL             HEPARIN LEVEL (UNFRACTIONATED)     Status: None   Collection Time    02/02/14 12:30 PM      Result Value Ref Range   Heparin Unfractionated 0.63  0.30 - 0.70 IU/mL   Comment:            IF  HEPARIN RESULTS ARE BELOW     EXPECTED VALUES, AND PATIENT     DOSAGE HAS BEEN CONFIRMED,     SUGGEST FOLLOW UP TESTING     OF ANTITHROMBIN III LEVELS.  CBC     Status: Abnormal   Collection Time    02/02/14  8:00 PM      Result Value Ref Range   WBC 15.0 (*) 4.0 - 10.5 K/uL   RBC 3.49 (*) 4.22 - 5.81 MIL/uL   Hemoglobin 11.3 (*) 13.0 - 17.0 g/dL   HCT 31.8 (*) 39.0 - 52.0 %   MCV 91.1  78.0 - 100.0 fL   MCH 32.4  26.0 - 34.0 pg   MCHC 35.5  30.0 - 36.0 g/dL   RDW 14.7  11.5 - 15.5 %   Platelets 259  150 - 400 K/uL    Studies/Results: Ct Abdomen Pelvis Wo Contrast  02/02/2014   CLINICAL DATA:  Abdominal pain, mass, possible abdominal hematoma  EXAM: CT ABDOMEN AND PELVIS WITHOUT CONTRAST  TECHNIQUE: Multidetector CT imaging of the abdomen and pelvis was performed following the standard protocol without intravenous contrast.  COMPARISON:  05/10/2011  FINDINGS: Right posterior rib fractures are noted. Fibrotic changes and probable chronic interstitial prominence noted lung bases. There is atelectasis or infiltrate in right lower lobe posteriorly. There are postsurgical changes and volume loss in right lower lobe.  Study is limited without IV contrast. Unenhanced liver, spleen and adrenals are unremarkable. Mild pancreatic atrophy. No calcified gallstones are noted within gallbladder.  Atherosclerotic calcifications of abdominal aorta and iliac arteries. Atherosclerotic calcifications of SMA. Unenhanced kidneys are  symmetrical in size. Bilateral mild perinephric stranding left greater than right. No nephrolithiasis. No hydronephrosis or hydroureter.  Moderate colonic stool. No retroperitoneal hematoma. No mesenteric fluid collection. No mesenteric fluid collection. Consistent with history in left abdominal wall along the rectus muscle there is a large hematoma with fluid fluid level containing layering blood products. The hematoma is best visualized in coronal image 17 measures 7.7 cm transverse by  14.5 cranial caudally. There is some mass effect on omental fat in left abdomen with inner bulge of left abdominal wall.  The urinary bladder is unremarkable. Prostate gland and seminal vesicles are unremarkable. No inguinal adenopathy. No destructive bony lesions are noted within pelvis.  Sagittal images of the spine shows degenerative changes lower lumbar spine. Significant disc space flattening with vacuum disc phenomenon anterior and posterior spurring at L4-L5 and L5-S1 level.  IMPRESSION: 1. Large intramuscular hematoma left abdominal wall left rectus muscle measures at least 14.5 cm x 7.7 cm. There is fluid fluid level with layering hyperdense blood products. 2. Atherosclerotic calcifications of abdominal aorta SMA and iliac arteries. 3. Moderate colonic stool. 4. No retroperitoneal hematoma. 5. There is atelectasis or infiltrate in right lower lobe posteromedially. Postsurgical changes are noted right base posteromedially. 6. Degenerative changes lumbar spine. Critical findings  discussed with patient's nurse.   Electronically Signed   By: Lahoma Crocker M.D.   On: 02/02/2014 18:48   Dg Chest Port 1 View  02/01/2014   CLINICAL DATA:  Respiratory failure. History of right lung carcinoma and lobectomy.  EXAM: PORTABLE CHEST - 1 VIEW  COMPARISON:  01/30/2014  FINDINGS: Airspace disease noted predominately on the left has significantly improved. There is and mild residual opacity at the left lung base.  Right pleural effusion obscures most of the right heart border and the right hemidiaphragm. Volume loss on the right is stable.  Cardiac silhouette is mostly obscured. Mediastinum is normal in contour.  IMPRESSION: Significantly improved lung aeration when compared to the recent prior study. This rapid improvement suggests that the lung opacity was due to pulmonary edema. Right pleural effusion is similar to the prior exam.   Electronically Signed   By: Lajean Manes M.D.   On: 02/01/2014 19:57    Medications: Scheduled Meds: . ALPRAZolam  0.25 mg Oral TID  . aspirin EC  81 mg Oral Q24H  . budesonide (PULMICORT) nebulizer solution  0.5 mg Nebulization BID  . ceFEPime (MAXIPIME) IV  1 g Intravenous Q24H  . citalopram  20 mg Oral Q24H  . docusate sodium  100 mg Oral BID  . ferrous [id number] C-folic acid  1 capsule Oral Q24H  . ipratropium  0.5 mg Nebulization Q6H  . levalbuterol  0.63 mg Nebulization Q6H  . levofloxacin (LEVAQUIN) IV  500 mg Intravenous Q48H  . methylPREDNISolone (SOLU-MEDROL) injection  40 mg Intravenous Q12H  . oseltamivir  30 mg Oral BID  . pantoprazole  40 mg Oral BID  . potassium chloride  20 mEq Oral Daily  . simvastatin  40 mg Oral Once per day on Mon Wed Fri  . sodium chloride  10-40 mL Intracatheter Q12H  . sodium chloride  3 mL Intravenous Q12H  . sodium chloride  3 mL Intravenous Q12H   Continuous Infusions:  PRN Meds:.acetaminophen, acetaminophen, morphine injection, sodium chloride, temazepam, traMADol  Assessment/Plan: Principal Problem:   Acute on chronic respiratory failure Active Problems:  Hematoma complicating a procedure - IV heparin has been discontinued  COPD with emphysema - we'll change to oral  prednisone 50 mg daily  Influenza A H3 - positive serology. Day 4 of Tamiflu, reduced dose secondary to renal insufficiency CARCINOMA, LUNG, SQUAMOUS CELL - bronchoscopy could contribute to information about recurrence or not  OSA (obstructive sleep apnea) - does not use at home  HCAP (healthcare-associated pneumonia) - on Maxipime and Levaquin and now on Tamiflu - will defer to pulmonary critical care for choice of oral antibiotic therapy - need to transition to.... 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

## 2014-02-03 NOTE — Progress Notes (Signed)
Subjective:  Breathing is better.  CT scan hematoma. Large. Heparin stopped. Rectus muscle.  No CP  Objective:  Vital Signs in the last 24 hours: Temp:  [97.5 F (36.4 C)-98.2 F (36.8 C)] 97.6 F (36.4 C) (03/02 0429) Pulse Rate:  [101-113] 103 (03/02 0429) BP: (105-144)/(72-96) 105/76 mmHg (03/02 0429) SpO2:  [91 %-100 %] 97 % (03/02 0429) Weight:  [169 lb 12.1 oz (77 kg)] 169 lb 12.1 oz (77 kg) (03/02 0429)  Intake/Output from previous day: 03/01 0701 - 03/02 0700 In: 695 [P.O.:480; I.V.:65; IV Piggyback:150] Out: 920 [Urine:920]   Physical Exam: General: Well developed, well nourished,breathing improved Head:  Normocephalic and atraumatic. Lungs:Poor air movement no wheeze this am.  Heart: Normal S1 and S2, mild tachy reg.   No murmur, rubs or gallops.  Abdomen: soft, non-tender, positive bowel sounds. Protuberant Extremities: No clubbing or cyanosis. No edema. Neurologic: Alert and oriented x 3.    Lab Results:  Recent Labs  02/02/14 0316 02/02/14 2000  WBC 15.1* 15.0*  HGB 11.6* 11.3*  PLT 236 259    Recent Labs  02/01/14 0254 02/02/14 0316  NA 133* 138  K 4.4 4.8  CL 92* 94*  CO2 21 23  GLUCOSE 169* 133*  BUN 68* 74*  CREATININE 2.84* 2.62*   Imaging: Ct Abdomen Pelvis Wo Contrast  02/02/2014   CLINICAL DATA:  Abdominal pain, mass, possible abdominal hematoma  EXAM: CT ABDOMEN AND PELVIS WITHOUT CONTRAST  TECHNIQUE: Multidetector CT imaging of the abdomen and pelvis was performed following the standard protocol without intravenous contrast.  COMPARISON:  05/10/2011  FINDINGS: Right posterior rib fractures are noted. Fibrotic changes and probable chronic interstitial prominence noted lung bases. There is atelectasis or infiltrate in right lower lobe posteriorly. There are postsurgical changes and volume loss in right lower lobe.  Study is limited without IV contrast. Unenhanced liver, spleen and adrenals are unremarkable. Mild pancreatic atrophy. No  calcified gallstones are noted within gallbladder.  Atherosclerotic calcifications of abdominal aorta and iliac arteries. Atherosclerotic calcifications of SMA. Unenhanced kidneys are symmetrical in size. Bilateral mild perinephric stranding left greater than right. No nephrolithiasis. No hydronephrosis or hydroureter.  Moderate colonic stool. No retroperitoneal hematoma. No mesenteric fluid collection. No mesenteric fluid collection. Consistent with history in left abdominal wall along the rectus muscle there is a large hematoma with fluid fluid level containing layering blood products. The hematoma is best visualized in coronal image 17 measures 7.7 cm transverse by 14.5 cranial caudally. There is some mass effect on omental fat in left abdomen with inner bulge of left abdominal wall.  The urinary bladder is unremarkable. Prostate gland and seminal vesicles are unremarkable. No inguinal adenopathy. No destructive bony lesions are noted within pelvis.  Sagittal images of the spine shows degenerative changes lower lumbar spine. Significant disc space flattening with vacuum disc phenomenon anterior and posterior spurring at L4-L5 and L5-S1 level.  IMPRESSION: 1. Large intramuscular hematoma left abdominal wall left rectus muscle measures at least 14.5 cm x 7.7 cm. There is fluid fluid level with layering hyperdense blood products. 2. Atherosclerotic calcifications of abdominal aorta SMA and iliac arteries. 3. Moderate colonic stool. 4. No retroperitoneal hematoma. 5. There is atelectasis or infiltrate in right lower lobe posteromedially. Postsurgical changes are noted right base posteromedially. 6. Degenerative changes lumbar spine. Critical findings  discussed with patient's nurse.   Electronically Signed   By: Lahoma Crocker M.D.   On: 02/02/2014 18:48   Dg Chest [facility]  02/01/2014   CLINICAL DATA:  Respiratory failure. History of right lung carcinoma and lobectomy.  EXAM: PORTABLE CHEST - 1 VIEW  COMPARISON:   01/30/2014  FINDINGS: Airspace disease noted predominately on the left has significantly improved. There is and mild residual opacity at the left lung base.  Right pleural effusion obscures most of the right heart border and the right hemidiaphragm. Volume loss on the right is stable.  Cardiac silhouette is mostly obscured. Mediastinum is normal in contour.  IMPRESSION: Significantly improved lung aeration when compared to the recent prior study. This rapid improvement suggests that the lung opacity was due to pulmonary edema. Right pleural effusion is similar to the prior exam.   Electronically Signed   By: Lajean Manes M.D.   On: 02/01/2014 19:57   Personally viewed.   Telemetry: Sinus tach Personally viewed.   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  2) NSTEMI  - unable to cath with elevated creatinine.   - likely mostly demand ischemia in setting of Resp failure.   3) Acute systolic heart failure/cardiomyopathy  - No beta blocker with COPD  - No ACE-I with renal issues.  - When diuresed earlier this admit - worsening creatinine.   - Appears euvolemic  - When reviewing prior echo, poor windows, EF of 50% likely overestimation.   4) CAD  - Prior RCA stents and LAD stent.   - No angina  Will monitor.   Jerry Haugen, [place] 02/03/2014, 7:30 AM

## 2014-02-03 NOTE — Progress Notes (Signed)
PHARMACIST - PHYSICIAN COMMUNICATION DR:   [name] CONCERNING: Antibiotic IV to Oral Route Change Policy  RECOMMENDATION: This patient is receiving levofloxacin by the intravenous route.  Based on criteria approved by the Pharmacy and Therapeutics Committee, the antibiotic(s) is/are being converted to the equivalent oral dose form(s).   DESCRIPTION: These criteria include:  Patient being treated for a respiratory tract infection, urinary tract infection, cellulitis or clostridium difficile associated diarrhea if on metronidazole  The patient is not neutropenic and does not exhibit a GI malabsorption state  The patient is eating (either orally or via tube) and/or has been taking other orally administered medications for a least 24 hours  The patient is improving clinically and has a Tmax < 100.5  If you have questions about this conversion, please contact the Pharmacy Department  []   [phone] )  [facility] [x]   [phone] )  [facility]  []   [phone] )  [facility] []   [phone] )  [place], PharmD, BCPS Clinical Pharmacist Pager [phone]

## 2014-02-03 NOTE — Progress Notes (Signed)
Pt arrived to floor via w/c from 2 central. A&0x4.  Oriented to room.  Call bell at reach, & instructed to call for assistance as needed.  Verbalized understanding.  Wife at the bedside.  Pt place on droplet precaution as ordered.  Will continue to monitor.  Angelita Ingles, RN.

## 2014-02-04 DIAGNOSIS — I2589 Other forms of chronic ischemic heart disease: Secondary | ICD-10-CM

## 2014-02-04 DIAGNOSIS — I255 Ischemic cardiomyopathy: Secondary | ICD-10-CM | POA: Diagnosis present

## 2014-02-04 DIAGNOSIS — N289 Disorder of kidney and ureter, unspecified: Secondary | ICD-10-CM | POA: Diagnosis not present

## 2014-02-04 LAB — CBC
HCT: 30.8 % — ABNORMAL LOW (ref 39.0–52.0)
Hemoglobin: 10.8 g/dL — ABNORMAL LOW (ref 13.0–17.0)
MCH: 32.3 pg (ref 26.0–34.0)
MCHC: 35.1 g/dL (ref 30.0–36.0)
MCV: 92.2 fL (ref 78.0–100.0)
Platelets: 244 10*3/uL (ref 150–400)
RBC: 3.34 MIL/uL — AB (ref 4.22–5.81)
RDW: 15 % (ref 11.5–15.5)
WBC: 17.3 10*3/uL — ABNORMAL HIGH (ref 4.0–10.5)

## 2014-02-04 LAB — BASIC METABOLIC PANEL
BUN: 85 mg/dL — ABNORMAL HIGH (ref 6–23)
CALCIUM: 9.1 mg/dL (ref 8.4–10.5)
CO2: 26 meq/L (ref 19–32)
Chloride: 97 mEq/L (ref 96–112)
Creatinine, Ser: 2.53 mg/dL — ABNORMAL HIGH (ref 0.50–1.35)
GFR calc Af Amer: 28 mL/min — ABNORMAL LOW (ref >90)
GFR calc non Af Amer: 24 mL/min — ABNORMAL LOW (ref >90)
Glucose, Bld: 77 mg/dL (ref 70–99)
Potassium: 5 mEq/L (ref 3.7–5.3)
SODIUM: 137 meq/L (ref 137–147)

## 2014-02-04 LAB — PRO B NATRIURETIC PEPTIDE: Pro B Natriuretic peptide (BNP): 4995 pg/mL — ABNORMAL HIGH (ref 0–125)

## 2014-02-04 MED ORDER — IPRATROPIUM BROMIDE 0.02 % IN SOLN: 0.5000 mg | Freq: Four times a day (QID) | RESPIRATORY_TRACT | Status: DC | PRN

## 2014-02-04 MED ORDER — IPRATROPIUM BROMIDE 0.02 % IN SOLN: 0.5000 mg | RESPIRATORY_TRACT | Status: DC | PRN

## 2014-02-04 MED ORDER — LEVALBUTEROL HCL 0.63 MG/3ML IN NEBU: 0.6300 mg | INHALATION_SOLUTION | Freq: Four times a day (QID) | RESPIRATORY_TRACT | Status: DC | PRN

## 2014-02-04 MED ORDER — LEVALBUTEROL HCL 0.63 MG/3ML IN NEBU: 0.6300 mg | INHALATION_SOLUTION | RESPIRATORY_TRACT | Status: DC | PRN

## 2014-02-04 MED ORDER — FUROSEMIDE 40 MG PO TABS
40.0000 mg | ORAL_TABLET | Freq: Every day | ORAL | Status: DC
  Administered 2014-02-04: 40 mg via ORAL
  Filled 2014-02-04 (×2): qty 1

## 2014-02-04 MED ORDER — PREDNISONE 20 MG PO TABS
20.0000 mg | ORAL_TABLET | Freq: Every day | ORAL | Status: DC
  Administered 2014-02-05: 20 mg via ORAL
  Filled 2014-02-04 (×2): qty 1

## 2014-02-04 NOTE — Progress Notes (Signed)
    Subjective:  In good spirits, wants to walk in the halls, no chest pain.  Objective:  Vital Signs in the last 24 hours: Temp:  [97 F (36.1 C)-98.4 F (36.9 C)] 97 F (36.1 C) (03/03 0928) Pulse Rate:  [100-115] 115 (03/03 0928) Resp:  [18-20] 18 (03/03 0928) BP: (112-134)/(72-82) 112/72 mmHg (03/03 0928) SpO2:  [91 %-98 %] 95 % (03/03 0928) Weight:  [169 lb 1.5 oz (76.7 kg)-170 lb 3.1 oz (77.2 kg)] 169 lb 1.5 oz (76.7 kg) (03/03 0605)  Intake/Output from previous day:  Intake/Output Summary (Last 24 hours) at 02/04/14 0942 Last data filed at 02/04/14 0935  Gross per 24 hour  Intake    890 ml  Output    440 ml  Net    450 ml    Physical Exam: General appearance: alert, cooperative and no distress Lungs: decreased breaths sounds Lt base few rhonchi on Rt Heart: regular rate and rhythm   Rate: 110  Rhythm: sinus tachycardia  Lab Results:  Recent Labs  02/03/14 0500 02/04/14 0530  WBC 17.5* 17.3*  HGB 10.9* 10.8*  PLT 265 244    Recent Labs  02/03/14 0500 02/04/14 0530  NA 134* 137  K 4.8 5.0  CL 94* 97  CO2 27 26  GLUCOSE 96 77  BUN 76* 85*  CREATININE 2.35* 2.53*     Imaging: Imaging results have been reviewed  Cardiac Studies:  Assessment/Plan:   Principal Problem:   Acute on chronic respiratory failure Active Problems:   Non-ST elevation MI (NSTEMI)   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 SCr up a  little today, he is on Lasix 40 mg and K+, will stop K+.  Hgb stable.   Kerin Ransom PA-C Beeper [phone] 02/04/2014, 9:42 AM  Personally seen and examined. Agree with above.  1) Rectus sheath hematoma  - heparin off  - watching Hbg. Stable.  2) NSTEMI  - unable to cath with elevated creatinine.  - likely mostly demand ischemia in setting of Resp failure.  - no acute work up at this point, especially with rectus sheath hematoma.  3) Acute systolic heart failure/cardiomyopathy  - No beta blocker with COPD  - No ACE-I with renal issues.  - When diuresed earlier this admit - worsening creatinine.  - Appears euvolemic  - When reviewing prior echo, poor windows, EF of 50% likely overestimation.   - OK with lasix PO 40mg  QD  4) CAD  - Prior RCA stents and LAD stent.  - No angina - Last NUC stress 5/14    Candee Furbish, MD

## 2014-02-04 NOTE — Progress Notes (Signed)
Came to visit patient on behalf of [employer] program for [employer] employees/dependents with [employer]. States he is very familiar with the program.Patient reports that he still does not think he needs to sign up with the program. However, he states he doesn't mind to be called post hospital discharge. Confirmed best contact information. Appreciative of visit.  Marthenia Rolling, MSN- RN, [facility] [phone]

## 2014-02-04 NOTE — Progress Notes (Signed)
Name: Todd Villanueva MRN: [id number] DOB: [birth year]    ADMISSION DATE:  01/27/2014 CONSULTATION DATE:  01/28/2014  REFERRING MD :  [name]  CHIEF COMPLAINT:  SOB and cough  BRIEF PATIENT DESCRIPTION:  73 yo male admitted with cough, wheeze, and dyspnea.  He is followed by [name] for COPD and hs of NSCLC s/p Rt VATS and chemo.  SIGNIFICANT EVENTS: 2/23 Admit 2/24 Oncology consulted for abnormal CT chest, Cardiology consulted for elevated troponins 2/25 Acute respiratory distress from pulmonary edema, transfer to ICU 3/01 Abdominal pain >> Lt rectus muscle hematoma 3/02 Transfer to telemetry  STUDIES:  04/19/12 PFT >> FEV1 1.76 (65%), FEV1% 69, TLC 6.01 (70%), DLCO 37% 2/23 CT chest >> 1.5 cm Rt paratracheal LAN, ASD Rt lung, emphysema, Rt pleural thickening with effusion 2/24 Echo >> EF 35 to 28%, grade 1 diastolic dysfx, mild LA dilation 3/01 CT abd/pelvis >> Lt abdominal wall rectus muscle hematoma  LINES / TUBES: PIV  CULTURES: RSV 2/25 >> influenza A  ANTIBIOTICS: Zosyn 2/23 x 1 Cefepime 2/23 >>> Vancomycin 2/23 >>> 2/26 Levaquin 2/25 >>  Tamiflu 2/27 >>   SUBJECTIVE:  Breathing okay at rest >> winded with activity, but recovers after few minutes.  Denies chest pain, cough, wheeze, sputum.  Has not used nebulizer therapy for several days.  VITAL SIGNS: Temp:  [97.4 F (36.3 C)-98.4 F (36.9 C)] 97.9 F (36.6 C) (03/03 0605) Pulse Rate:  [100-113] 100 (03/03 0605) Resp:  [16-20] 19 (03/03 0605) BP: (116-134)/(73-86) 134/82 mmHg (03/03 0605) SpO2:  [90 %-98 %] 95 % (03/03 0605) Weight:  [169 lb 1.5 oz (76.7 kg)-170 lb 3.1 oz (77.2 kg)] 169 lb 1.5 oz (76.7 kg) (03/03 0605)  PHYSICAL EXAMINATION: General: no distress, sitting in chair Neuro: normal strength HEENT: no sinus tenderness Cardiovascular: regular Lungs: no wheeze Abdomen: soft Musculoskeletal: no edema  CBC Recent Labs     02/02/14  2000  02/03/14  0500  02/04/14  0530  WBC  15.0*   17.5*  17.3*  HGB  11.3*  10.9*  10.8*  HCT  31.8*  31.0*  30.8*  PLT  259  265  244   BMET Recent Labs     02/02/14  0316  02/03/14  0500  02/04/14  0530  NA  138  134*  137  K  4.8  4.8  5.0  CL  94*  94*  97  CO2  23  27  26   BUN  74*  76*  85*  CREATININE  2.62*  2.35*  2.53*  GLUCOSE  133*  96  77    Electrolytes Recent Labs     02/02/14  0316  02/03/14  0500  02/04/14  0530  CALCIUM  8.6  9.1  9.1   Imaging Ct Abdomen Pelvis Wo Contrast  02/02/2014   CLINICAL DATA:  Abdominal pain, mass, possible abdominal hematoma  EXAM: CT ABDOMEN AND PELVIS WITHOUT CONTRAST  TECHNIQUE: Multidetector CT imaging of the abdomen and pelvis was performed following the standard protocol without intravenous contrast.  COMPARISON:  05/10/2011  FINDINGS: Right posterior rib fractures are noted. Fibrotic changes and probable chronic interstitial prominence noted lung bases. There is atelectasis or infiltrate in right lower lobe posteriorly. There are postsurgical changes and volume loss in right lower lobe.  Study is limited without IV contrast. Unenhanced liver, spleen and adrenals are unremarkable. Mild pancreatic atrophy. No calcified gallstones are noted within gallbladder.  Atherosclerotic calcifications of abdominal aorta and iliac  arteries. Atherosclerotic calcifications of SMA. Unenhanced kidneys are symmetrical in size. Bilateral mild perinephric stranding left greater than right. No nephrolithiasis. No hydronephrosis or hydroureter.  Moderate colonic stool. No retroperitoneal hematoma. No mesenteric fluid collection. No mesenteric fluid collection. Consistent with history in left abdominal wall along the rectus muscle there is a large hematoma with fluid fluid level containing layering blood products. The hematoma is best visualized in coronal image 17 measures 7.7 cm transverse by 14.5 cranial caudally. There is some mass effect on omental fat in left abdomen with inner bulge of left abdominal  wall.  The urinary bladder is unremarkable. Prostate gland and seminal vesicles are unremarkable. No inguinal adenopathy. No destructive bony lesions are noted within pelvis.  Sagittal images of the spine shows degenerative changes lower lumbar spine. Significant disc space flattening with vacuum disc phenomenon anterior and posterior spurring at L4-L5 and L5-S1 level.  IMPRESSION: 1. Large intramuscular hematoma left abdominal wall left rectus muscle measures at least 14.5 cm x 7.7 cm. There is fluid fluid level with layering hyperdense blood products. 2. Atherosclerotic calcifications of abdominal aorta SMA and iliac arteries. 3. Moderate colonic stool. 4. No retroperitoneal hematoma. 5. There is atelectasis or infiltrate in right lower lobe posteromedially. Postsurgical changes are noted right base posteromedially. 6. Degenerative changes lumbar spine. Critical findings  discussed with patient's nurse.   Electronically Signed   By: Lahoma Crocker M.D.   On: 02/02/2014 18:48          ASSESSMENT / PLAN: A: Acute respiratory failure 2nd to influenza A, pneumonia, AECOPD and CHF. P: -wean prednisone as tolerated to off over next several days -change nebs to prn atrovent, xopenex (had episodes of SVT earlier) -f/u CXR as needed -Abx, tamiflu per primary team  A: Hx of lung cancer with prominent Rt paratracheal nodes and Rt pleural enhancement. P: -will need f/u CT chest in several weeks after recovery from acute illness -defer bronchoscopy for now  A: Acute on chronic systolic/diastolic CHF. NSTEMI with hx of CAD, HTN. P: -per cardiology and primary team >> will need cath as some point  A: Rectus muscle hematoma developed 3/01. P: -per primary team  A: Acute kidney injury >> improving. P: -f/u BMET  Have schedule follow up in pulmonary office for April 13 at 1:30pm.  PCCM will sign off.  Please call if additional help needed while he is in hospital.  [name], MD [facility] 02/04/2014, 9:13 AM Pager:  [phone] After 3pm call: [phone]

## 2014-02-04 NOTE — Progress Notes (Signed)
Walking Saturations:  Patient ambulated length of one hallway (around 500 feet) Patient Saturations on Room Air at Rest = 95% Patient Saturations on Room Air while Ambulating = 92%  Patient Saturations on 2 Liters of oxygen while Ambulating = N/A

## 2014-02-04 NOTE — Progress Notes (Signed)
PT Cancellation Note  Patient Details Name: Todd Villanueva MRN: [id number] DOB: 01/27/1941   Cancelled Treatment:    Reason Eval/Treat Not Completed: Other (comment). Pt just ambulated with RN staff 5 minutes ago. Pt fatigued at this time. PT to return as able.   [name], Knute Neu 02/04/2014, 1:39 PM

## 2014-02-04 NOTE — Progress Notes (Signed)
Subjective: Todd Villanueva continues to feel better. Still having some chest pain with cough. Left upper quadrant is nontender where his hematoma is. Not requiring oxygen  Objective: Weight change: 7.1 oz (0.2 kg)  Intake/Output Summary (Last 24 hours) at 02/04/14 0815 Last data filed at 02/04/14 0617  Gross per 24 hour  Intake    770 ml  Output    540 ml  Net    230 ml   Filed Vitals:   02/03/14 1612 02/03/14 1900 02/03/14 2041 02/04/14 0605  BP:  134/79 116/73 134/82  Pulse:  113 100 100  Temp: 97.6 F (36.4 C) 98.4 F (36.9 C) 97.4 F (36.3 C) 97.9 F (36.6 C)  TempSrc:  Oral Oral Oral  Resp:  18 20 19   Height:      Weight:  170 lb 3.1 oz (77.2 kg)  169 lb 1.5 oz (76.7 kg)  SpO2: 94% 98% 98% 95%     General Appearance: Alert, cooperative, no distress, appears stated age  Lungs: Breath sounds on right improved in upper lobe, no wheezes currently  Heart: Regular rhythm, mild rate increase S1 and S2 normal, no murmur, rub or gallop  Abdomen: Left upper quadrant with changes consistent with rectus sheath hematoma as per history of present illness.  Extremities: Extremities normal, atraumatic, no cyanosis or edema  Neuro: Oriented x3, nonfocal, moves all extremities well, no dysarthria   Lab Results: Results for orders placed during the hospital encounter of 01/27/14 (from the past 48 hour(s))  HEPARIN LEVEL (UNFRACTIONATED)     Status: None   Collection Time    02/02/14 12:30 PM      Result Value Ref Range   Heparin Unfractionated 0.63  0.30 - 0.70 IU/mL   Comment:            IF HEPARIN RESULTS ARE BELOW     EXPECTED VALUES, AND PATIENT     DOSAGE HAS BEEN CONFIRMED,     SUGGEST FOLLOW UP TESTING     OF ANTITHROMBIN III LEVELS.  CBC     Status: Abnormal   Collection Time    02/02/14  8:00 PM      Result Value Ref Range   WBC 15.0 (*) 4.0 - 10.5 K/uL   RBC 3.49 (*) 4.22 - 5.81 MIL/uL   Hemoglobin 11.3 (*) 13.0 - 17.0 g/dL   HCT 31.8 (*) 39.0 - 52.0 %   MCV 91.1  78.0 -  100.0 fL   MCH 32.4  26.0 - 34.0 pg   MCHC 35.5  30.0 - 36.0 g/dL   RDW 14.7  11.5 - 15.5 %   Platelets 259  150 - 400 K/uL  HEPARIN LEVEL (UNFRACTIONATED)     Status: Abnormal   Collection Time    02/03/14  5:00 AM      Result Value Ref Range   Heparin Unfractionated <0.10 (*) 0.30 - 0.70 IU/mL   Comment:            IF HEPARIN RESULTS ARE BELOW     EXPECTED VALUES, AND PATIENT     DOSAGE HAS BEEN CONFIRMED,     SUGGEST FOLLOW UP TESTING     OF ANTITHROMBIN III LEVELS.     REPEATED TO VERIFY     SPECIMEN CHECKED FOR CLOTS  CBC     Status: Abnormal   Collection Time    02/03/14  5:00 AM      Result Value Ref Range   WBC 17.5 (*) 4.0 - 10.5 K/uL  RBC 3.37 (*) 4.22 - 5.81 MIL/uL   Hemoglobin 10.9 (*) 13.0 - 17.0 g/dL   HCT 31.0 (*) 39.0 - 52.0 %   MCV 92.0  78.0 - 100.0 fL   MCH 32.3  26.0 - 34.0 pg   MCHC 35.2  30.0 - 36.0 g/dL   RDW 14.8  11.5 - 15.5 %   Platelets 265  150 - 400 K/uL  BASIC METABOLIC PANEL     Status: Abnormal   Collection Time    02/03/14  5:00 AM      Result Value Ref Range   Sodium 134 (*) 137 - 147 mEq/L   Potassium 4.8  3.7 - 5.3 mEq/L   Chloride 94 (*) 96 - 112 mEq/L   CO2 27  19 - 32 mEq/L   Glucose, Bld 96  70 - 99 mg/dL   BUN 76 (*) 6 - 23 mg/dL   Creatinine, Ser 2.35 (*) 0.50 - 1.35 mg/dL   Calcium 9.1  8.4 - 10.5 mg/dL   GFR calc non Af Amer 26 (*) >90 mL/min   GFR calc Af Amer 31 (*) >90 mL/min   Comment: (NOTE)     The eGFR has been calculated using the CKD EPI equation.     This calculation has not been validated in all clinical situations.     eGFR's persistently <90 mL/min signify possible Chronic Kidney     Disease.  CBC     Status: Abnormal   Collection Time    02/04/14  5:30 AM      Result Value Ref Range   WBC 17.3 (*) 4.0 - 10.5 K/uL   RBC 3.34 (*) 4.22 - 5.81 MIL/uL   Hemoglobin 10.8 (*) 13.0 - 17.0 g/dL   HCT 30.8 (*) 39.0 - 52.0 %   MCV 92.2  78.0 - 100.0 fL   MCH 32.3  26.0 - 34.0 pg   MCHC 35.1  30.0 - 36.0 g/dL    RDW 15.0  11.5 - 15.5 %   Platelets 244  150 - 400 K/uL  BASIC METABOLIC PANEL     Status: Abnormal   Collection Time    02/04/14  5:30 AM      Result Value Ref Range   Sodium 137  137 - 147 mEq/L   Potassium 5.0  3.7 - 5.3 mEq/L   Chloride 97  96 - 112 mEq/L   CO2 26  19 - 32 mEq/L   Glucose, Bld 77  70 - 99 mg/dL   BUN 85 (*) 6 - 23 mg/dL   Creatinine, Ser 2.53 (*) 0.50 - 1.35 mg/dL   Calcium 9.1  8.4 - 10.5 mg/dL   GFR calc non Af Amer 24 (*) >90 mL/min   GFR calc Af Amer 28 (*) >90 mL/min   Comment: (NOTE)     The eGFR has been calculated using the CKD EPI equation.     This calculation has not been validated in all clinical situations.     eGFR's persistently <90 mL/min signify possible Chronic Kidney     Disease.    Studies/Results: Ct Abdomen Pelvis Wo Contrast  02/02/2014   CLINICAL DATA:  Abdominal pain, mass, possible abdominal hematoma  EXAM: CT ABDOMEN AND PELVIS WITHOUT CONTRAST  TECHNIQUE: Multidetector CT imaging of the abdomen and pelvis was performed following the standard protocol without intravenous contrast.  COMPARISON:  05/10/2011  FINDINGS: Right posterior rib fractures are noted. Fibrotic changes and probable chronic interstitial prominence noted lung bases. There  is atelectasis or infiltrate in right lower lobe posteriorly. There are postsurgical changes and volume loss in right lower lobe.  Study is limited without IV contrast. Unenhanced liver, spleen and adrenals are unremarkable. Mild pancreatic atrophy. No calcified gallstones are noted within gallbladder.  Atherosclerotic calcifications of abdominal aorta and iliac arteries. Atherosclerotic calcifications of SMA. Unenhanced kidneys are symmetrical in size. Bilateral mild perinephric stranding left greater than right. No nephrolithiasis. No hydronephrosis or hydroureter.  Moderate colonic stool. No retroperitoneal hematoma. No mesenteric fluid collection. No mesenteric fluid collection. Consistent with history in  left abdominal wall along the rectus muscle there is a large hematoma with fluid fluid level containing layering blood products. The hematoma is best visualized in coronal image 17 measures 7.7 cm transverse by 14.5 cranial caudally. There is some mass effect on omental fat in left abdomen with inner bulge of left abdominal wall.  The urinary bladder is unremarkable. Prostate gland and seminal vesicles are unremarkable. No inguinal adenopathy. No destructive bony lesions are noted within pelvis.  Sagittal images of the spine shows degenerative changes lower lumbar spine. Significant disc space flattening with vacuum disc phenomenon anterior and posterior spurring at L4-L5 and L5-S1 level.  IMPRESSION: 1. Large intramuscular hematoma left abdominal wall left rectus muscle measures at least 14.5 cm x 7.7 cm. There is fluid fluid level with layering hyperdense blood products. 2. Atherosclerotic calcifications of abdominal aorta SMA and iliac arteries. 3. Moderate colonic stool. 4. No retroperitoneal hematoma. 5. There is atelectasis or infiltrate in right lower lobe posteromedially. Postsurgical changes are noted right base posteromedially. 6. Degenerative changes lumbar spine. Critical findings  discussed with patient's nurse.   Electronically Signed   By: Lahoma Crocker M.D.   On: 02/02/2014 18:48   Medications: Scheduled Meds: . ALPRAZolam  0.25 mg Oral TID  . aspirin EC  81 mg Oral Q24H  . budesonide (PULMICORT) nebulizer solution  0.5 mg Nebulization BID  . citalopram  20 mg Oral Q24H  . docusate sodium  100 mg Oral BID  . ferrous [id number] C-folic acid  1 capsule Oral Q24H  . furosemide  40 mg Oral Daily  . levofloxacin  500 mg Oral Q48H  . oseltamivir  30 mg Oral BID  . pantoprazole  40 mg Oral BID  . potassium chloride  20 mEq Oral Daily  . predniSONE  50 mg Oral Q breakfast  . simvastatin  40 mg Oral Once per day on Mon Wed Fri  . sodium chloride  10-40 mL Intracatheter Q12H  . sodium  chloride  3 mL Intravenous Q12H  . sodium chloride  3 mL Intravenous Q12H   Continuous Infusions:  PRN Meds:.acetaminophen, acetaminophen, ipratropium, levalbuterol, morphine injection, sodium chloride, temazepam, traMADol  Assessment/Plan:  Principal Problem:  Acute on chronic respiratory failure  Active Problems:  Hematoma complicating a procedure - IV heparin has been discontinued - symptomatically improved COPD with emphysema - continue oral prednisone 50 mg daily  Influenza A H3 - positive serology. Day 5 of Tamiflu, reduced dose secondary to renal insufficiency - discontinue today  CARCINOMA, LUNG, SQUAMOUS CELL - plan is for repeat repeat CT scan - in several weeks. Defer bronchoscopy for now OSA (obstructive sleep apnea) - does not use at home  HCAP (healthcare-associated pneumonia) - discontinue the Maxipime and continue Levaquin for a total of 10 days and he is finishing up Tamiflu  T7 vertebral fracture - aware  Non-ST elevation MI (NSTEMI) - aware, as per cardiology - plan is for outpatient  workup of possible ischemic heart disease. Because of rectus sheath hematoma, need to do further workup for now. Currently without chest pain  Anemia - hemoglobin has been stable even with rectus sheath hematoma  Unspecified essential hypertension - controlled  Other and unspecified hyperlipidemia - aware  Acute on chronic respiratory failure - acute respiratory failure resolving Acute combined systolic and diastolic HF (heart failure) -  resume Lasix today at 40 mg daily and check basic metabolic profile and BNP over the next 2-3 days  IV access - PICC in the right upper arm  Leucocytosis - Likely steroid induced at this time  Disposition - plan is for discharge home later this week if renal function and respiratory status is stable     LOS: 8 days   [name], MD 02/04/2014, 8:15 AM

## 2014-02-05 ENCOUNTER — Other Ambulatory Visit: Payer: 59

## 2014-02-05 LAB — BASIC METABOLIC PANEL
BUN: 89 mg/dL — AB (ref 6–23)
CO2: 25 mEq/L (ref 19–32)
Calcium: 9.4 mg/dL (ref 8.4–10.5)
Chloride: 97 mEq/L (ref 96–112)
Creatinine, Ser: 2.66 mg/dL — ABNORMAL HIGH (ref 0.50–1.35)
GFR calc non Af Amer: 23 mL/min — ABNORMAL LOW (ref >90)
GFR, EST AFRICAN AMERICAN: 26 mL/min — AB (ref >90)
Glucose, Bld: 71 mg/dL (ref 70–99)
Potassium: 5 mEq/L (ref 3.7–5.3)
Sodium: 137 mEq/L (ref 137–147)

## 2014-02-05 LAB — CBC
HEMATOCRIT: 30 % — AB (ref 39.0–52.0)
Hemoglobin: 10.5 g/dL — ABNORMAL LOW (ref 13.0–17.0)
MCH: 32.6 pg (ref 26.0–34.0)
MCHC: 35 g/dL (ref 30.0–36.0)
MCV: 93.2 fL (ref 78.0–100.0)
Platelets: 234 10*3/uL (ref 150–400)
RBC: 3.22 MIL/uL — ABNORMAL LOW (ref 4.22–5.81)
RDW: 15 % (ref 11.5–15.5)
WBC: 15.6 10*3/uL — ABNORMAL HIGH (ref 4.0–10.5)

## 2014-02-05 LAB — PRO B NATRIURETIC PEPTIDE: Pro B Natriuretic peptide (BNP): 4481 pg/mL — ABNORMAL HIGH (ref 0–125)

## 2014-02-05 MED ORDER — ALPRAZOLAM 0.25 MG PO TABS: 0.2500 mg | ORAL_TABLET | Freq: Three times a day (TID) | ORAL | Status: DC

## 2014-02-05 MED ORDER — ASPIRIN 81 MG PO TBEC: 81.0000 mg | DELAYED_RELEASE_TABLET | ORAL | Status: DC

## 2014-02-05 MED ORDER — FUROSEMIDE 40 MG PO TABS: 40.0000 mg | ORAL_TABLET | ORAL | Status: DC

## 2014-02-05 MED ORDER — DSS 100 MG PO CAPS: 100.0000 mg | ORAL_CAPSULE | Freq: Two times a day (BID) | ORAL | Status: DC

## 2014-02-05 MED ORDER — PREDNISONE 10 MG PO TABS: 20.0000 mg | ORAL_TABLET | Freq: Every day | ORAL | Status: DC

## 2014-02-05 NOTE — Progress Notes (Signed)
PT Cancellation Note  Patient Details Name: Todd Villanueva MRN: [id number] DOB: May 08, 1941   Cancelled Treatment:    Reason Eval/Treat Not Completed: PT screened, no needs identified, will sign off (pt reports he has not problems with mobility and is just waiting to go home.  )   Norwood Levo 02/05/2014, 9:41 AM

## 2014-02-05 NOTE — Discharge Summary (Signed)
Physician Discharge Summary  NAME:Laithan JERICO GRISSO  EYE:[id number]  DOB: [birth year]   Admit date: 01/27/2014 Discharge date: 02/05/2014  Discharge Diagnoses:  Principal Problem:   Acute on chronic respiratory failure - resolved Active Problems:   Rectus sheath hematoma while on Heparin - symptomatically improved   CARCINOMA, LUNG, SQUAMOUS CELL - aware, history of, followup with Dr. [name] as below   OSA (obstructive sleep apnea)   COPD exacerbation - improved   HCAP (healthcare-associated pneumonia) - status post 10 days of IV antibiotic therapy. Resolved   T7 vertebral fracture - new. We'll plan biphosphonate therapy as an outpatient   Non-ST elevation MI (NSTEMI) - continue aspirin and medications as below. 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   Influenza due to identified novel influenza A virus with other respiratory manifestations   Leukocytosis, unspecified   Acute renal insufficiency - renal function still depressed. Follow closely as an outpatient   Cardiomyopathy, ischemic EF 35-40% by echo 01/28/14   Discharge Physical Exam:  General Appearance: Alert, cooperative, no distress, appears stated age  Weight change: -3 lb 4.7 oz (-1.495 kg)  Intake/Output Summary (Last 24 hours) at 02/05/14 0756 Last data filed at 02/05/14 0719  Gross per 24 hour  Intake    360 ml  Output    660 ml  Net   -300 ml   Filed Vitals:   02/04/14 1400 02/04/14 1811 02/04/14 1956 02/05/14 0639  BP: 103/66 119/73 132/82 122/72  Pulse: 121 113 109 102  Temp: 97.6 F (36.4 C)  98 F (36.7 C) 97.7 F (36.5 C)  TempSrc: Oral  Oral Oral  Resp: _0 Height:      Weight:    166 lb 14.4 oz (75.705 kg)  SpO2: 94% 97% 95% 95%    General Appearance: Alert, cooperative, no distress,  appears stated age  Lungs: Breath sounds on right improved in upper lobe, no wheezes currently, no rales Heart: Regular rhythm, mild rate increase S1 and S2 normal, no murmur, rub or gallop  Abdomen: Left upper quadrant with rectus sheath hematoma, resolving Extremities: Extremities normal, atraumatic, no cyanosis or edema  Neuro: Oriented x3, nonfocal, moves all extremities well, no dysarthria  Discharge Condition: Much improved  Hospital Course:  Mr. Michall Noffke Ha is a very pleasant 73 year old male with a history of COPD, non-small cell lung cancer with right middle lobe and right lower lobe lobectomies, hypertension, and anxiety, who was admitted about a month ago for a COPD exacerbation and for several days prior to admission he had a persistent productive cough with worsening dyspnea on exertion. He reported some heaviness over his "rib cage" for about a month. CT angiogram of the chest in the emergency room revealed a right upper lobe pneumonia but no PE. He had an increased troponin and EKG showed new Q waves in the inferolateral leads. CT angiogram of the chest also revealed a T7 compression fracture that was negative. 2-D echocardiogram revealed fairly marked depression of his cardiac EF at 35-40%. Patient was treated with intravenous antibiotic therapy for his right upper lobe pneumonia and also diuresed per cardiology for heart failure. He never never required intubation but did require BiPAP for 24-48 hours. Now doing well on room air without the need for oxygen even while ambulating. He has home  O2 available if he develops shortness of breath. He was started on intravenous heparin for his non-STEMI which had to be stopped because of a rectus sheath hematoma. He'll be discharged home on a baby aspirin. He is not having chest tightness or symptoms of angina currently, even with ambulating in the hall. Plans are for further outpatient workup of his coronary artery disease - has a history  of coronary stenting in the past - but will hope that his renal function will improve. Discharge creatinine is in the 2.6 range. Benicar will be held. In terms of his pulmonary status, this improved markedly with use of IV antibiotics for the right upper lobe pneumonia, diuresis, and with use of intravenous then oral steroids and bronchodilators. We'll continue prednisone taper over the next 2 weeks. He has completed 10 days of antibiotic therapy. He was also found to have a deep T7 compression fracture as mentioned above and plans will be to start biphosphonate therapy as an outpatient.  He has a history of non-small cell lung CA and there is still a possibility that there is recurrence. Patient will plan to followup with Dr. [name] for pulmonary followup in Dr. Gust Brooms cardiac followup and Dr. [name] for oncology followup I will plan to see him back in the office to monitor renal function.  Things to follow up in the outpatient setting:  Follow for increasing shortness of breath or chest pains. Weight gain greater than 3 pounds it should be reported immediately.  Consults: Treatment Team:  [name], MD - oncology  Candee Furbish, MD - [facility]  [name], MD -  Pulmonary Critical Care Medicine    Disposition: 01-Home or Self Care  Discharge Orders   Future Appointments Provider Department Dept Phone   03/11/2014 9:15 AM [facility] [phone]   03/11/2014 10:30 AM [facility] 2 [facility] [phone]   Liquids only 4 hours prior to your exam. Any medications can be taken as usual. Please arrive 15 min prior to your scheduled exam time.   03/17/2014 11:00 AM [name], MD [facility] [phone]   03/17/2014 1:30 PM [name], MD [facility] [phone]   04/25/2014 2:30 PM Candee Furbish, MD [facility] [phone]    Future Orders Complete By Expires   (HEART FAILURE PATIENTS) Call MD:  Anytime you have any of the following symptoms: 1) 3 pound weight gain in 24 hours or 5 pounds in 1 week 2) shortness of breath, with or without a dry hacking cough 3) swelling in the hands, feet or stomach 4) if you have to sleep on extra pillows at night in order to breathe.  As directed    Call MD for:  difficulty breathing, headache or visual disturbances  As directed    Call MD for:  extreme fatigue  As directed    Call MD for:  temperature >100.4  As directed    Diet - low sodium heart healthy  As directed    Increase activity slowly  As directed        Medication List    STOP taking these medications       azithromycin 250 MG tablet  Commonly known as:  ZITHROMAX     olmesartan 20 MG tablet  Commonly known as:  BENICAR      TAKE these medications       ALPRAZolam 0.25 MG tablet  Commonly known as:  XANAX  Take 1 tablet (0.25 mg total) by mouth 3 (three) times daily.     aspirin 81 MG EC tablet  Take 1 tablet (81 mg total) by mouth daily.     bevacizumab 1.25 mg/0.1 mL Soln  Commonly known as:  AVASTIN  1.25 mg by Intravitreal route every 30 (thirty) days.     citalopram 20 MG tablet  Commonly known as:  CELEXA  Take 20 mg by mouth daily.     DSS 100 MG Caps  Take 100 mg by mouth 2 (two) times daily.     FOLIVANE-PLUS Caps  Take 1 capsule by mouth daily.     furosemide 40 MG tablet  Commonly known as:  LASIX  Take 1 tablet (40 mg total) by mouth 3 (three) times a week.  Start taking on:  02/07/2014     omeprazole 20 MG capsule  Commonly known as:  PRILOSEC  Take 20 mg by mouth daily.     predniSONE 10 MG tablet  Commonly known as:  DELTASONE  Take 2 tablets (20 mg total) by mouth daily with breakfast. Take 2 tablets daily for one week then one tablet daily for one week then discontinue     simvastatin 40 MG tablet  Commonly known as:  ZOCOR  Take 40 mg by mouth every Monday,  Wednesday, and Friday.     temazepam 30 MG capsule  Commonly known as:  RESTORIL  Take 1 capsule (30 mg total) by mouth at bedtime as needed for sleep.     XOPENEX 0.63 MG/3ML nebulizer solution  Generic drug:  levalbuterol  Take 0.63 mg by nebulization 3 (three) times daily as needed for wheezing or shortness of breath.           Follow-up Information   Follow up with SOOD,VINEET, MD On 03/17/2014. (1:30 pm)    Specialty:  Pulmonary Disease   Contact information:   [address] [place] [address] [phone]       The results of significant diagnostics from this hospitalization (including imaging, microbiology, ancillary and laboratory) are listed below for reference.    Significant Diagnostic Studies: Ct Abdomen Pelvis Wo Contrast  02/02/2014   CLINICAL DATA:  Abdominal pain, mass, possible abdominal hematoma  EXAM: CT ABDOMEN AND PELVIS WITHOUT CONTRAST  TECHNIQUE: Multidetector CT imaging of the abdomen and pelvis was performed following the standard protocol without intravenous contrast.  COMPARISON:  05/10/2011  FINDINGS: Right posterior rib fractures are noted. Fibrotic changes and probable chronic interstitial prominence noted lung bases. There is atelectasis or infiltrate in right lower lobe posteriorly. There are postsurgical changes and volume loss in right lower lobe.  Study is limited without IV contrast. Unenhanced liver, spleen and adrenals are unremarkable. Mild pancreatic atrophy. No calcified gallstones are noted within gallbladder.  Atherosclerotic calcifications of abdominal aorta and iliac arteries. Atherosclerotic calcifications of SMA. Unenhanced kidneys are symmetrical in size. Bilateral mild perinephric stranding left greater than right. No nephrolithiasis. No hydronephrosis or hydroureter.  Moderate colonic stool. No retroperitoneal hematoma. No mesenteric fluid collection. No mesenteric fluid collection. Consistent with history in left abdominal wall  along the rectus muscle there is a large hematoma with fluid fluid level containing layering blood products. The hematoma is best visualized in coronal image 17 measures 7.7 cm transverse by 14.5 cranial caudally. There is some mass effect on omental fat in left abdomen with inner bulge of left abdominal wall.  The urinary bladder is unremarkable. Prostate  gland and seminal vesicles are unremarkable. No inguinal adenopathy. No destructive bony lesions are noted within pelvis.  Sagittal images of the spine shows degenerative changes lower lumbar spine. Significant disc space flattening with vacuum disc phenomenon anterior and posterior spurring at L4-L5 and L5-S1 level.  IMPRESSION: 1. Large intramuscular hematoma left abdominal wall left rectus muscle measures at least 14.5 cm x 7.7 cm. There is fluid fluid level with layering hyperdense blood products. 2. Atherosclerotic calcifications of abdominal aorta SMA and iliac arteries. 3. Moderate colonic stool. 4. No retroperitoneal hematoma. 5. There is atelectasis or infiltrate in right lower lobe posteromedially. Postsurgical changes are noted right base posteromedially. 6. Degenerative changes lumbar spine. Critical findings  discussed with patient's nurse.   Electronically Signed   By: Lahoma Crocker M.D.   On: 02/02/2014 18:48   Ct Angio Chest Pe W/cm &/or Wo Cm  01/27/2014   CLINICAL DATA:  Lung cancer, prior right middle and lower lobectomies. Shortness of breath. Cough. Chest pain.  EXAM: CT ANGIOGRAPHY CHEST WITH CONTRAST  TECHNIQUE: Multidetector CT imaging of the chest was performed using the standard protocol during bolus administration of intravenous contrast. Multiplanar CT image reconstructions and MIPs were obtained to evaluate the vascular anatomy.  CONTRAST:  166m OMNIPAQUE IOHEXOL 350 MG/ML SOLN  COMPARISON:  DG CHEST 1V PORT dated 01/27/2014; CT CHEST W/CM dated 08/12/2013; CT CHEST W/CM dated 02/11/2013; DG CHEST 2 VIEW dated 12/16/2013  FINDINGS: The  patient received 2 boluses of contrast. The first was a 50 cc bolus of Omnipaque 350 which triggered the pressure limit. Repeated exam with 80 cc of contrast performed with much better opacification of the vascular structures. Similar truncated appearance of a right pulmonary artery due to the lobectomies. No visualized filling defects in the remaining right pulmonary arterial tree.  A mixed density appearance in the left lower lobe pulmonary arterial structures appears to correlate directly with readily observable motion artifact on the lung window images, and does not have a characteristic morphology for pulmonary embolus. Accordingly, there is abnormal hypodensity is attributed to artifact for example on image 165 of series 16.  Right upper paratracheal lymph node, 1.5 cm in short axis on image 18 of series 15 (formerly 1.1 cm). Left prevascular node 0.9 cm in short axis (formerly 0.7 cm).  Coronary atherosclerosis. Thick pleural enhancement on the right side with associated pleural fluid. Increase in interstitial and airspace opacity in the base of the right lung as on image 56 of series 17. Multiple right rib fractures or osteotomies, similar to prior. Prominent emphysema noted. 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  There is a new 90% compression fracture at T7 with 3 mm of posterior retropulsion contributing to central stenosis. . Benign compression fracture favored over pathologic fracture particularly in light of the relatively normal appearance of this vertebra on 08/12/2013.  Review of the MIP images confirms the above findings.  IMPRESSION: 1. No embolus identified. 2. Enlarging right upper paratracheal lymph node, 1.5 cm in short axis, possibly reactive or malignant. 3. Increasing airspace opacity at the inferior margin of the remaining right upper lobe, nonspecific but potentially from radiation pneumonitis, pneumonia,  or spread of tumor. 4. Acute 90% compression fracture of the T7 vertebra with 3 mm of posterior retropulsion contributing to central stenosis. It is 5. Coronary atherosclerosis. 6. Thick pleural enhancement on the right side with right pleural effusion, similar to prior. 7. Multiple right  rib fractures are osteotomies, similar to prior. 8. Prominent emphysema. 9. Airway thickening along the right hilum with occlusion or near occlusion of the right tracheobronchial tree in this vicinity.   Electronically Signed   By: Sherryl Barters M.D.   On: 01/27/2014 21:10   Dg Chest Port 1 View  02/01/2014   CLINICAL DATA:  Respiratory failure. History of right lung carcinoma and lobectomy.  EXAM: PORTABLE CHEST - 1 VIEW  COMPARISON:  01/30/2014  FINDINGS: Airspace disease noted predominately on the left has significantly improved. There is and mild residual opacity at the left lung base.  Right pleural effusion obscures most of the right heart border and the right hemidiaphragm. Volume loss on the right is stable.  Cardiac silhouette is mostly obscured. Mediastinum is normal in contour.  IMPRESSION: Significantly improved lung aeration when compared to the recent prior study. This rapid improvement suggests that the lung opacity was due to pulmonary edema. Right pleural effusion is similar to the prior exam.   Electronically Signed   By: Lajean Manes M.D.   On: 02/01/2014 19:57   Dg Chest Port 1 View  01/30/2014   CLINICAL DATA:  Short of breath  EXAM: PORTABLE CHEST - 1 VIEW  COMPARISON:  DG CHEST 1V PORT dated 01/29/2014; CT ANGIO CHEST W/CM &/OR WO/CM dated 01/27/2014  FINDINGS: There is continued volume loss in the right hemi thorax with dense opacity at the right base. Diffuse airspace disease throughout the left lung has increased. Mild airspace disease in the right lung is stable. No pneumothorax.  IMPRESSION: Increasing airspace disease throughout the left lung. Otherwise stable exam.   Electronically Signed   By:  Maryclare Bean M.D.   On: 01/30/2014 07:42   Dg Chest Port 1 View  01/29/2014   CLINICAL DATA:  assess infiltate  EXAM: PORTABLE CHEST - 1 VIEW  COMPARISON:  CT ANGIO CHEST W/CM &/OR WO/CM dated 01/27/2014; DG CHEST 1V PORT dated 01/27/2014  FINDINGS: The cardiac silhouette is obscured secondary to the right lower lobe density. There has been interval development of diffuse prominence of the interstitial markings, peribronchial cuffing, indistinctness of the pulmonary vasculature. Low lung volumes. Persistent consolidative density projects in the right lung base. Remote rib fractures are again appreciated within the right hemithorax.  IMPRESSION: Interstitial infiltrate consistent with pulmonary edema. There is persistent consolidative density in the right lung base a component of which likely represents a small pleural effusion.   Electronically Signed   By: Margaree Mackintosh M.D.   On: 01/29/2014 12:08   Dg Chest Portable 1 View  01/27/2014   CLINICAL DATA:  Shortness breath.  History of lung cancer.  EXAM: PORTABLE CHEST - 1 VIEW  COMPARISON:  12/16/2013.  FINDINGS: Stable post therapy changes right lung including multiple remote right-sided rib fractures (some which are not completely healed). Mediastinal shift to the right. Limited for evaluating for recurrent tumor or detecting underlying infiltrate/ atelectasis/ pleural effusion given the postoperative changes.  Calcified mildly tortuous aorta.  Mild pulmonary vascular prominence without frank pulmonary edema.  IMPRESSION: Stable post therapy exam as noted above.   Electronically Signed   By: Chauncey Cruel M.D.   On: 01/27/2014 18:31    Microbiology: Recent Results (from the past 240 hour(s))  MRSA PCR SCREENING     Status: None   Collection Time    01/28/14  1:24 AM      Result Value Ref Range Status   MRSA by PCR NEGATIVE  NEGATIVE Final  Comment:            The GeneXpert MRSA Assay (FDA     approved for NASAL specimens     only), is one component  of a     comprehensive MRSA colonization     surveillance program. It is not     intended to diagnose MRSA     infection nor to guide or     monitor treatment for     MRSA infections.  RESPIRATORY VIRUS PANEL     Status: Abnormal   Collection Time    01/29/14  3:21 AM      Result Value Ref Range Status   Source - RVPAN NASAL SWAB   Corrected   Comment: CORRECTED ON 02/25 AT 1823: PREVIOUSLY REPORTED AS NASAL SWAB   Respiratory Syncytial Virus A NOT DETECTED   Final   Respiratory Syncytial Virus B NOT DETECTED   Final   Influenza A DETECTED (*)  Final   Influenza B NOT DETECTED   Final   Parainfluenza 1 NOT DETECTED   Final   Parainfluenza 2 NOT DETECTED   Final   Parainfluenza 3 NOT DETECTED   Final   Metapneumovirus NOT DETECTED   Final   Rhinovirus NOT DETECTED   Final   Adenovirus NOT DETECTED   Final   Influenza A H1 NOT DETECTED   Final   Influenza A H3 DETECTED (*)  Final   Comment: (NOTE)           Normal Reference Range for each Analyte: NOT DETECTED     Testing performed using the Luminex xTAG Respiratory Viral Panel test     kit.     This test was developed and its performance characteristics determined     by [employer]. It has not been cleared or approved by the [place]     Food and Drug Administration. This test is used for clinical purposes.     It should not be regarded as investigational or for research. This     laboratory is certified under the [place] (CLIA) as qualified to perform high complexity     clinical laboratory testing.     Performed at [facility]: Results for orders placed during the hospital encounter of 01/27/14  MRSA PCR SCREENING      Result Value Ref Range   MRSA by PCR NEGATIVE  NEGATIVE  RESPIRATORY VIRUS PANEL      Result Value Ref Range   Source - RVPAN NASAL SWAB     Respiratory Syncytial Virus A NOT DETECTED     Respiratory Syncytial Virus B NOT DETECTED      Influenza A DETECTED (*)    Influenza B NOT DETECTED     Parainfluenza 1 NOT DETECTED     Parainfluenza 2 NOT DETECTED     Parainfluenza 3 NOT DETECTED     Metapneumovirus NOT DETECTED     Rhinovirus NOT DETECTED     Adenovirus NOT DETECTED     Influenza A H1 NOT DETECTED     Influenza A H3 DETECTED (*)   CBC      Result Value Ref Range   WBC 12.9 (*) 4.0 - 10.5 K/uL   RBC 3.01 (*) 4.22 - 5.81 MIL/uL   Hemoglobin 9.9 (*) 13.0 - 17.0 g/dL   HCT 29.1 (*) 39.0 - 52.0 %   MCV 96.7  78.0 - 100.0  fL   MCH 32.9  26.0 - 34.0 pg   MCHC 34.0  30.0 - 36.0 g/dL   RDW 15.0  11.5 - 15.5 %   Platelets 134 (*) 150 - 400 K/uL  PRO B NATRIURETIC PEPTIDE      Result Value Ref Range   Pro B Natriuretic peptide (BNP) 2835.0 (*) 0 - 125 pg/mL  PROTIME-INR      Result Value Ref Range   Prothrombin Time 12.6  11.6 - 15.2 seconds   INR 0.96  0.00 - 1.49  URINALYSIS, ROUTINE W REFLEX MICROSCOPIC      Result Value Ref Range   Color, Urine YELLOW  YELLOW   APPearance CLEAR  CLEAR   Specific Gravity, Urine >1.046 (*) 1.005 - 1.030   pH 5.5  5.0 - 8.0   Glucose, UA NEGATIVE  NEGATIVE mg/dL   Hgb urine dipstick NEGATIVE  NEGATIVE   Bilirubin Urine NEGATIVE  NEGATIVE   Ketones, ur 15 (*) NEGATIVE mg/dL   Protein, ur 100 (*) NEGATIVE mg/dL   Urobilinogen, UA 1.0  0.0 - 1.0 mg/dL   Nitrite NEGATIVE  NEGATIVE   Leukocytes, UA NEGATIVE  NEGATIVE  TROPONIN I      Result Value Ref Range   Troponin I 0.55 (*) <0.30 ng/mL  HEPARIN LEVEL (UNFRACTIONATED)      Result Value Ref Range   Heparin Unfractionated 0.48  0.30 - 0.70 IU/mL  CBC      Result Value Ref Range   WBC 10.5  4.0 - 10.5 K/uL   RBC 2.57 (*) 4.22 - 5.81 MIL/uL   Hemoglobin 8.5 (*) 13.0 - 17.0 g/dL   HCT 25.1 (*) 39.0 - 52.0 %   MCV 97.7  78.0 - 100.0 fL   MCH 33.1  26.0 - 34.0 pg   MCHC 33.9  30.0 - 36.0 g/dL   RDW 15.4  11.5 - 15.5 %   Platelets 121 (*) 150 - 400 K/uL  URINE MICROSCOPIC-ADD ON      Result Value Ref Range   WBC, UA  0-2  <3 WBC/hpf   Casts GRANULAR CAST (*) NEGATIVE  TROPONIN I      Result Value Ref Range   Troponin I 0.66 (*) <0.30 ng/mL  TROPONIN I      Result Value Ref Range   Troponin I 1.31 (*) <0.30 ng/mL  TROPONIN I      Result Value Ref Range   Troponin I 1.55 (*) <0.30 ng/mL  COMPREHENSIVE METABOLIC PANEL      Result Value Ref Range   Sodium 137  137 - 147 mEq/L   Potassium 3.6 (*) 3.7 - 5.3 mEq/L   Chloride 101  96 - 112 mEq/L   CO2 17 (*) 19 - 32 mEq/L   Glucose, Bld 298 (*) 70 - 99 mg/dL   BUN 26 (*) 6 - 23 mg/dL   Creatinine, Ser 1.26  0.50 - 1.35 mg/dL   Calcium 8.0 (*) 8.4 - 10.5 mg/dL   Total Protein 6.2  6.0 - 8.3 g/dL   Albumin 2.3 (*) 3.5 - 5.2 g/dL   AST 25  0 - 37 U/L   ALT 26  0 - 53 U/L   Alkaline Phosphatase 156 (*) 39 - 117 U/L   Total Bilirubin 0.4  0.3 - 1.2 mg/dL   GFR calc non Af Amer 56 (*) >90 mL/min   GFR calc Af Amer 65 (*) >90 mL/min  CBC WITH DIFFERENTIAL      Result  Value Ref Range   WBC 8.8  4.0 - 10.5 K/uL   RBC 2.44 (*) 4.22 - 5.81 MIL/uL   Hemoglobin 8.1 (*) 13.0 - 17.0 g/dL   HCT 23.7 (*) 39.0 - 52.0 %   MCV 97.1  78.0 - 100.0 fL   MCH 33.2  26.0 - 34.0 pg   MCHC 34.2  30.0 - 36.0 g/dL   RDW 15.3  11.5 - 15.5 %   Platelets 124 (*) 150 - 400 K/uL   Neutrophils Relative % 97 (*) 43 - 77 %   Neutro Abs 8.5 (*) 1.7 - 7.7 K/uL   Lymphocytes Relative 2 (*) 12 - 46 %   Lymphs Abs 0.1 (*) 0.7 - 4.0 K/uL   Monocytes Relative 2 (*) 3 - 12 %   Monocytes Absolute 0.1  0.1 - 1.0 K/uL   Eosinophils Relative 0  0 - 5 %   Eosinophils Absolute 0.0  0.0 - 0.7 K/uL   Basophils Relative 0  0 - 1 %   Basophils Absolute 0.0  0.0 - 0.1 K/uL  TSH      Result Value Ref Range   TSH 0.258 (*) 0.350 - 4.500 uIU/mL  CBC      Result Value Ref Range   WBC 13.4 (*) 4.0 - 10.5 K/uL   RBC 3.16 (*) 4.22 - 5.81 MIL/uL   Hemoglobin 10.2 (*) 13.0 - 17.0 g/dL   HCT 29.2 (*) 39.0 - 52.0 %   MCV 92.4  78.0 - 100.0 fL   MCH 32.3  26.0 - 34.0 pg   MCHC 34.9  30.0 - 36.0  g/dL   RDW 15.7 (*) 11.5 - 15.5 %   Platelets 120 (*) 150 - 400 K/uL  PROCALCITONIN      Result Value Ref Range   Procalcitonin 3.68    HEPARIN LEVEL (UNFRACTIONATED)      Result Value Ref Range   Heparin Unfractionated 0.47  0.30 - 0.70 IU/mL  HEPARIN LEVEL (UNFRACTIONATED)      Result Value Ref Range   Heparin Unfractionated 0.62  0.30 - 0.70 IU/mL  CBC      Result Value Ref Range   WBC 14.2 (*) 4.0 - 10.5 K/uL   RBC 3.25 (*) 4.22 - 5.81 MIL/uL   Hemoglobin 10.5 (*) 13.0 - 17.0 g/dL   HCT 30.4 (*) 39.0 - 52.0 %   MCV 93.5  78.0 - 100.0 fL   MCH 32.3  26.0 - 34.0 pg   MCHC 34.5  30.0 - 36.0 g/dL   RDW 15.9 (*) 11.5 - 15.5 %   Platelets 131 (*) 150 - 400 K/uL  BASIC METABOLIC PANEL      Result Value Ref Range   Sodium 140  137 - 147 mEq/L   Potassium 3.7  3.7 - 5.3 mEq/L   Chloride 104  96 - 112 mEq/L   CO2 21  19 - 32 mEq/L   Glucose, Bld 131 (*) 70 - 99 mg/dL   BUN 26 (*) 6 - 23 mg/dL   Creatinine, Ser 1.52 (*) 0.50 - 1.35 mg/dL   Calcium 8.1 (*) 8.4 - 10.5 mg/dL   GFR calc non Af Amer 45 (*) >90 mL/min   GFR calc Af Amer 52 (*) >90 mL/min  TROPONIN I      Result Value Ref Range   Troponin I 1.28 (*) <0.30 ng/mL  PROCALCITONIN      Result Value Ref Range   Procalcitonin 2.97    VANCOMYCIN,  TROUGH      Result Value Ref Range   Vancomycin Tr 21.2 (*) 10.0 - 20.0 ug/mL  BLOOD GAS, ARTERIAL      Result Value Ref Range   O2 Content 6.0     Delivery systems NASAL CANNULA     pH, Arterial 7.386  7.350 - 7.450   pCO2 arterial 36.9  35.0 - 45.0 mmHg   pO2, Arterial 58.2 (*) 80.0 - 100.0 mmHg   Bicarbonate 21.6  20.0 - 24.0 mEq/L   TCO2 22.8  0 - 100 mmol/L   Acid-base deficit 2.6 (*) 0.0 - 2.0 mmol/L   O2 Saturation 88.6     Patient temperature 98.6     Collection site RIGHT RADIAL     Drawn by [id number]     Sample type ARTERIAL DRAW     Allens test (pass/fail) PASS  PASS  HEPARIN LEVEL (UNFRACTIONATED)      Result Value Ref Range   Heparin Unfractionated 0.72 (*)  0.30 - 0.70 IU/mL  CBC      Result Value Ref Range   WBC 16.3 (*) 4.0 - 10.5 K/uL   RBC 3.69 (*) 4.22 - 5.81 MIL/uL   Hemoglobin 11.9 (*) 13.0 - 17.0 g/dL   HCT 34.0 (*) 39.0 - 52.0 %   MCV 92.1  78.0 - 100.0 fL   MCH 32.2  26.0 - 34.0 pg   MCHC 35.0  30.0 - 36.0 g/dL   RDW 15.6 (*) 11.5 - 15.5 %   Platelets 158  150 - 400 K/uL  PROCALCITONIN      Result Value Ref Range   Procalcitonin 0.27    BASIC METABOLIC PANEL      Result Value Ref Range   Sodium 146  137 - 147 mEq/L   Potassium 3.7  3.7 - 5.3 mEq/L   Chloride 84 (*) 96 - 112 mEq/L   CO2 20  19 - 32 mEq/L   Glucose, Bld 174 (*) 70 - 99 mg/dL   BUN 34 (*) 6 - 23 mg/dL   Creatinine, Ser 1.59 (*) 0.50 - 1.35 mg/dL   Calcium 8.2 (*) 8.4 - 10.5 mg/dL   GFR calc non Af Amer 42 (*) >90 mL/min   GFR calc Af Amer 49 (*) >90 mL/min  HEPARIN LEVEL (UNFRACTIONATED)      Result Value Ref Range   Heparin Unfractionated 0.60  0.30 - 0.70 IU/mL  CBC      Result Value Ref Range   WBC 15.3 (*) 4.0 - 10.5 K/uL   RBC 3.74 (*) 4.22 - 5.81 MIL/uL   Hemoglobin 12.0 (*) 13.0 - 17.0 g/dL   HCT 33.8 (*) 39.0 - 52.0 %   MCV 90.4  78.0 - 100.0 fL   MCH 32.1  26.0 - 34.0 pg   MCHC 35.5  30.0 - 36.0 g/dL   RDW 14.9  11.5 - 15.5 %   Platelets 192  150 - 400 K/uL  BASIC METABOLIC PANEL      Result Value Ref Range   Sodium 135 (*) 137 - 147 mEq/L   Potassium 4.0  3.7 - 5.3 mEq/L   Chloride 92 (*) 96 - 112 mEq/L   CO2 25  19 - 32 mEq/L   Glucose, Bld 164 (*) 70 - 99 mg/dL   BUN 51 (*) 6 - 23 mg/dL   Creatinine, Ser 2.57 (*) 0.50 - 1.35 mg/dL   Calcium 8.3 (*) 8.4 - 10.5 mg/dL   GFR calc non Af Wyvonnia Lora  24 (*) >90 mL/min   GFR calc Af Amer 27 (*) >90 mL/min  VANCOMYCIN, TROUGH      Result Value Ref Range   Vancomycin Tr 36.8 (*) 10.0 - 20.0 ug/mL  HEPARIN LEVEL (UNFRACTIONATED)      Result Value Ref Range   Heparin Unfractionated 0.55  0.30 - 0.70 IU/mL  CBC      Result Value Ref Range   WBC 14.2 (*) 4.0 - 10.5 K/uL   RBC 3.65 (*) 4.22 -  5.81 MIL/uL   Hemoglobin 11.7 (*) 13.0 - 17.0 g/dL   HCT 33.4 (*) 39.0 - 52.0 %   MCV 91.5  78.0 - 100.0 fL   MCH 32.1  26.0 - 34.0 pg   MCHC 35.0  30.0 - 36.0 g/dL   RDW 14.6  11.5 - 15.5 %   Platelets 195  150 - 400 K/uL  BASIC METABOLIC PANEL      Result Value Ref Range   Sodium 133 (*) 137 - 147 mEq/L   Potassium 4.4  3.7 - 5.3 mEq/L   Chloride 92 (*) 96 - 112 mEq/L   CO2 21  19 - 32 mEq/L   Glucose, Bld 169 (*) 70 - 99 mg/dL   BUN 68 (*) 6 - 23 mg/dL   Creatinine, Ser 2.84 (*) 0.50 - 1.35 mg/dL   Calcium 8.3 (*) 8.4 - 10.5 mg/dL   GFR calc non Af Amer 21 (*) >90 mL/min   GFR calc Af Amer 24 (*) >90 mL/min  VANCOMYCIN, RANDOM      Result Value Ref Range   Vancomycin Rm 25.8    HEPARIN LEVEL (UNFRACTIONATED)      Result Value Ref Range   Heparin Unfractionated 0.27 (*) 0.30 - 0.70 IU/mL  CBC      Result Value Ref Range   WBC 15.1 (*) 4.0 - 10.5 K/uL   RBC 3.65 (*) 4.22 - 5.81 MIL/uL   Hemoglobin 11.6 (*) 13.0 - 17.0 g/dL   HCT 33.3 (*) 39.0 - 52.0 %   MCV 91.2  78.0 - 100.0 fL   MCH 31.8  26.0 - 34.0 pg   MCHC 34.8  30.0 - 36.0 g/dL   RDW 14.7  11.5 - 15.5 %   Platelets 236  150 - 400 K/uL  BASIC METABOLIC PANEL      Result Value Ref Range   Sodium 138  137 - 147 mEq/L   Potassium 4.8  3.7 - 5.3 mEq/L   Chloride 94 (*) 96 - 112 mEq/L   CO2 23  19 - 32 mEq/L   Glucose, Bld 133 (*) 70 - 99 mg/dL   BUN 74 (*) 6 - 23 mg/dL   Creatinine, Ser 2.62 (*) 0.50 - 1.35 mg/dL   Calcium 8.6  8.4 - 10.5 mg/dL   GFR calc non Af Amer 23 (*) >90 mL/min   GFR calc Af Amer 27 (*) >90 mL/min  VANCOMYCIN, RANDOM      Result Value Ref Range   Vancomycin Rm 24.7    HEPARIN LEVEL (UNFRACTIONATED)      Result Value Ref Range   Heparin Unfractionated 0.63  0.30 - 0.70 IU/mL  HEPARIN LEVEL (UNFRACTIONATED)      Result Value Ref Range   Heparin Unfractionated <0.10 (*) 0.30 - 0.70 IU/mL  CBC      Result Value Ref Range   WBC 17.5 (*) 4.0 - 10.5 K/uL   RBC 3.37 (*) 4.22 - 5.81  MIL/uL   Hemoglobin  10.9 (*) 13.0 - 17.0 g/dL   HCT 31.0 (*) 39.0 - 52.0 %   MCV 92.0  78.0 - 100.0 fL   MCH 32.3  26.0 - 34.0 pg   MCHC 35.2  30.0 - 36.0 g/dL   RDW 14.8  11.5 - 15.5 %   Platelets 265  150 - 400 K/uL  BASIC METABOLIC PANEL      Result Value Ref Range   Sodium 134 (*) 137 - 147 mEq/L   Potassium 4.8  3.7 - 5.3 mEq/L   Chloride 94 (*) 96 - 112 mEq/L   CO2 27  19 - 32 mEq/L   Glucose, Bld 96  70 - 99 mg/dL   BUN 76 (*) 6 - 23 mg/dL   Creatinine, Ser 2.35 (*) 0.50 - 1.35 mg/dL   Calcium 9.1  8.4 - 10.5 mg/dL   GFR calc non Af Amer 26 (*) >90 mL/min   GFR calc Af Amer 31 (*) >90 mL/min  CBC      Result Value Ref Range   WBC 15.0 (*) 4.0 - 10.5 K/uL   RBC 3.49 (*) 4.22 - 5.81 MIL/uL   Hemoglobin 11.3 (*) 13.0 - 17.0 g/dL   HCT 31.8 (*) 39.0 - 52.0 %   MCV 91.1  78.0 - 100.0 fL   MCH 32.4  26.0 - 34.0 pg   MCHC 35.5  30.0 - 36.0 g/dL   RDW 14.7  11.5 - 15.5 %   Platelets 259  150 - 400 K/uL  CBC      Result Value Ref Range   WBC 17.3 (*) 4.0 - 10.5 K/uL   RBC 3.34 (*) 4.22 - 5.81 MIL/uL   Hemoglobin 10.8 (*) 13.0 - 17.0 g/dL   HCT 30.8 (*) 39.0 - 52.0 %   MCV 92.2  78.0 - 100.0 fL   MCH 32.3  26.0 - 34.0 pg   MCHC 35.1  30.0 - 36.0 g/dL   RDW 15.0  11.5 - 15.5 %   Platelets 244  150 - 400 K/uL  BASIC METABOLIC PANEL      Result Value Ref Range   Sodium 137  137 - 147 mEq/L   Potassium 5.0  3.7 - 5.3 mEq/L   Chloride 97  96 - 112 mEq/L   CO2 26  19 - 32 mEq/L   Glucose, Bld 77  70 - 99 mg/dL   BUN 85 (*) 6 - 23 mg/dL   Creatinine, Ser 2.53 (*) 0.50 - 1.35 mg/dL   Calcium 9.1  8.4 - 10.5 mg/dL   GFR calc non Af Amer 24 (*) >90 mL/min   GFR calc Af Amer 28 (*) >90 mL/min  PRO B NATRIURETIC PEPTIDE      Result Value Ref Range   Pro B Natriuretic peptide (BNP) 4995.0 (*) 0 - 125 pg/mL  CBC      Result Value Ref Range   WBC 15.6 (*) 4.0 - 10.5 K/uL   RBC 3.22 (*) 4.22 - 5.81 MIL/uL   Hemoglobin 10.5 (*) 13.0 - 17.0 g/dL   HCT 30.0 (*) 39.0 - 52.0 %    MCV 93.2  78.0 - 100.0 fL   MCH 32.6  26.0 - 34.0 pg   MCHC 35.0  30.0 - 36.0 g/dL   RDW 15.0  11.5 - 15.5 %   Platelets 234  150 - 400 K/uL  BASIC METABOLIC PANEL      Result Value Ref Range   Sodium 137  137 -  147 mEq/L   Potassium 5.0  3.7 - 5.3 mEq/L   Chloride 97  96 - 112 mEq/L   CO2 25  19 - 32 mEq/L   Glucose, Bld 71  70 - 99 mg/dL   BUN 89 (*) 6 - 23 mg/dL   Creatinine, Ser 2.66 (*) 0.50 - 1.35 mg/dL   Calcium 9.4  8.4 - 10.5 mg/dL   GFR calc non Af Amer 23 (*) >90 mL/min   GFR calc Af Amer 26 (*) >90 mL/min  PRO B NATRIURETIC PEPTIDE      Result Value Ref Range   Pro B Natriuretic peptide (BNP) 4481.0 (*) 0 - 125 pg/mL  I-STAT CHEM 8, ED      Result Value Ref Range   Sodium 133 (*) 137 - 147 mEq/L   Potassium 3.8  3.7 - 5.3 mEq/L   Chloride 98  96 - 112 mEq/L   BUN 27 (*) 6 - 23 mg/dL   Creatinine, Ser 1.30  0.50 - 1.35 mg/dL   Glucose, Bld 104 (*) 70 - 99 mg/dL   Calcium, Ion 1.14  1.13 - 1.30 mmol/L   TCO2 26  0 - 100 mmol/L   Hemoglobin 9.5 (*) 13.0 - 17.0 g/dL   HCT 28.0 (*) 39.0 - 52.0 %  I-STAT TROPOININ, ED      Result Value Ref Range   Troponin i, poc 0.42 (*) 0.00 - 0.08 ng/mL   Comment NOTIFIED PHYSICIAN     Comment 3           I-STAT CG4 LACTIC ACID, ED      Result Value Ref Range   Lactic Acid, Venous 1.79  0.5 - 2.2 mmol/L  I-STAT TROPOININ, ED      Result Value Ref Range   Troponin i, poc 0.45 (*) 0.00 - 0.08 ng/mL   Comment NOTIFIED PHYSICIAN     Comment 3           I-STAT TROPOININ, ED      Result Value Ref Range   Troponin i, poc 0.46 (*) 0.00 - 0.08 ng/mL   Comment NOTIFIED PHYSICIAN     Comment 3           TYPE AND SCREEN      Result Value Ref Range   ABO/RH(D) O POS     Antibody Screen NEG     Sample Expiration 01/31/2014     Unit Number [id number]     Blood Component Type RED CELLS,LR     Unit division 00     Status of Unit ISSUED,FINAL     Transfusion Status OK TO TRANSFUSE     Crossmatch Result Compatible     Unit  Number [id number]     Blood Component Type RBC LR PHER2     Unit division 00     Status of Unit ISSUED,FINAL     Transfusion Status OK TO TRANSFUSE     Crossmatch Result Compatible    PREPARE RBC (CROSSMATCH)      Result Value Ref Range   Order Confirmation ORDER PROCESSED BY BLOOD BANK      Time coordinating discharge: 40 minutes  Signed: [name], MD 02/05/2014, 7:56 AM

## 2014-02-05 NOTE — Progress Notes (Signed)
Pt discharge to home A&Ox4. Discharge instructions given, pt verbalized understanding. No complaints of pain. No in any distress.

## 2014-02-07 ENCOUNTER — Telehealth: Payer: Self-pay | Admitting: Internal Medicine

## 2014-02-07 ENCOUNTER — Other Ambulatory Visit: Payer: 59 | Admitting: Lab

## 2014-02-07 ENCOUNTER — Ambulatory Visit (HOSPITAL_COMMUNITY): Payer: 59

## 2014-02-07 ENCOUNTER — Other Ambulatory Visit (HOSPITAL_COMMUNITY): Payer: 59

## 2014-02-07 NOTE — Telephone Encounter (Signed)
s.w. pt and advised on all March appt....pt requested ct at [facility]...done...pt aware of ALL appts

## 2014-02-11 ENCOUNTER — Ambulatory Visit: Payer: 59 | Admitting: Internal Medicine

## 2014-02-17 ENCOUNTER — Encounter (INDEPENDENT_AMBULATORY_CARE_PROVIDER_SITE_OTHER): Payer: 59 | Admitting: Ophthalmology

## 2014-02-17 DIAGNOSIS — H43819 Vitreous degeneration, unspecified eye: Secondary | ICD-10-CM

## 2014-02-17 DIAGNOSIS — H348192 Central retinal vein occlusion, unspecified eye, stable: Secondary | ICD-10-CM

## 2014-02-17 DIAGNOSIS — H35039 Hypertensive retinopathy, unspecified eye: Secondary | ICD-10-CM

## 2014-02-17 DIAGNOSIS — I10 Essential (primary) hypertension: Secondary | ICD-10-CM

## 2014-03-10 ENCOUNTER — Telehealth: Payer: Self-pay | Admitting: Cardiology

## 2014-03-10 ENCOUNTER — Other Ambulatory Visit (HOSPITAL_BASED_OUTPATIENT_CLINIC_OR_DEPARTMENT_OTHER): Payer: 59

## 2014-03-10 DIAGNOSIS — C3430 Malignant neoplasm of lower lobe, unspecified bronchus or lung: Secondary | ICD-10-CM

## 2014-03-10 DIAGNOSIS — C3490 Malignant neoplasm of unspecified part of unspecified bronchus or lung: Secondary | ICD-10-CM

## 2014-03-10 LAB — TECHNOLOGIST REVIEW

## 2014-03-10 LAB — CBC WITH DIFFERENTIAL/PLATELET
BASO%: 0.5 % (ref 0.0–2.0)
BASOS ABS: 0.1 10*3/uL (ref 0.0–0.1)
EOS%: 0.3 % (ref 0.0–7.0)
Eosinophils Absolute: 0 10*3/uL (ref 0.0–0.5)
HCT: 30.7 % — ABNORMAL LOW (ref 38.4–49.9)
HGB: 10 g/dL — ABNORMAL LOW (ref 13.0–17.1)
LYMPH%: 19 % (ref 14.0–49.0)
MCH: 32.4 pg (ref 27.2–33.4)
MCHC: 32.6 g/dL (ref 32.0–36.0)
MCV: 99.4 fL — ABNORMAL HIGH (ref 79.3–98.0)
MONO#: 1.1 10*3/uL — ABNORMAL HIGH (ref 0.1–0.9)
MONO%: 11.3 % (ref 0.0–14.0)
NEUT#: 6.6 10*3/uL — ABNORMAL HIGH (ref 1.5–6.5)
NEUT%: 68.9 % (ref 39.0–75.0)
PLATELETS: 194 10*3/uL (ref 140–400)
RBC: 3.09 10*6/uL — ABNORMAL LOW (ref 4.20–5.82)
RDW: 17 % — AB (ref 11.0–14.6)
WBC: 9.5 10*3/uL (ref 4.0–10.3)
lymph#: 1.8 10*3/uL (ref 0.9–3.3)
nRBC: 0 % (ref 0–0)

## 2014-03-10 LAB — COMPREHENSIVE METABOLIC PANEL (CC13)
ALK PHOS: 135 U/L (ref 40–150)
ALT: 28 U/L (ref 0–55)
AST: 27 U/L (ref 5–34)
Albumin: 3.4 g/dL — ABNORMAL LOW (ref 3.5–5.0)
Anion Gap: 14 mEq/L — ABNORMAL HIGH (ref 3–11)
BUN: 31.4 mg/dL — AB (ref 7.0–26.0)
CO2: 25 mEq/L (ref 22–29)
CREATININE: 1.5 mg/dL — AB (ref 0.7–1.3)
Calcium: 9.1 mg/dL (ref 8.4–10.4)
Chloride: 107 mEq/L (ref 98–109)
GLUCOSE: 73 mg/dL (ref 70–140)
Potassium: 3.7 mEq/L (ref 3.5–5.1)
SODIUM: 146 meq/L — AB (ref 136–145)
Total Bilirubin: 0.59 mg/dL (ref 0.20–1.20)
Total Protein: 6.7 g/dL (ref 6.4–8.3)

## 2014-03-10 NOTE — Telephone Encounter (Signed)
I ran into [place] in the hallway at the hospital. He tells me he is having a band like tightness around his chest when he exerts himself. It is associated with SOB. He has an appointment to follow up with his oncologist but doesn't see Dr Marlou Porch till May. I suggested we move up his appointment, will try and get him in the office this week.   Kerin Ransom PA-C 03/10/2014 11:05 AM

## 2014-03-11 ENCOUNTER — Ambulatory Visit (HOSPITAL_COMMUNITY)
Admission: RE | Admit: 2014-03-11 | Discharge: 2014-03-11 | Disposition: A | Discharge status: Home / Self Care | Payer: 59 | Source: Ambulatory Visit | Attending: Internal Medicine | Admitting: Internal Medicine

## 2014-03-11 ENCOUNTER — Other Ambulatory Visit: Payer: 59

## 2014-03-11 ENCOUNTER — Ambulatory Visit (HOSPITAL_COMMUNITY): Payer: 59

## 2014-03-11 DIAGNOSIS — C3490 Malignant neoplasm of unspecified part of unspecified bronchus or lung: Secondary | ICD-10-CM | POA: Insufficient documentation

## 2014-03-11 MED ORDER — IOHEXOL 350 MG/ML SOLN
80.0000 mL | Freq: Once | INTRAVENOUS | Status: AC | PRN
  Administered 2014-03-11: 80 mL via INTRAVENOUS

## 2014-03-17 ENCOUNTER — Encounter: Payer: Self-pay | Admitting: Internal Medicine

## 2014-03-17 ENCOUNTER — Other Ambulatory Visit: Payer: 59

## 2014-03-17 ENCOUNTER — Encounter: Payer: Self-pay | Admitting: Pulmonary Disease

## 2014-03-17 ENCOUNTER — Ambulatory Visit (HOSPITAL_BASED_OUTPATIENT_CLINIC_OR_DEPARTMENT_OTHER): Payer: 59 | Admitting: Internal Medicine

## 2014-03-17 ENCOUNTER — Ambulatory Visit (INDEPENDENT_AMBULATORY_CARE_PROVIDER_SITE_OTHER): Payer: 59 | Admitting: Pulmonary Disease

## 2014-03-17 VITALS — BP 138/82 | HR 117 | Ht 66.5 in | Wt 178.0 lb

## 2014-03-17 VITALS — BP 142/90 | HR 107 | Temp 97.4°F | Resp 18 | Ht 67.0 in | Wt 176.7 lb

## 2014-03-17 DIAGNOSIS — C3490 Malignant neoplasm of unspecified part of unspecified bronchus or lung: Secondary | ICD-10-CM

## 2014-03-17 DIAGNOSIS — R079 Chest pain, unspecified: Secondary | ICD-10-CM

## 2014-03-17 DIAGNOSIS — C3430 Malignant neoplasm of lower lobe, unspecified bronchus or lung: Secondary | ICD-10-CM

## 2014-03-17 DIAGNOSIS — J438 Other emphysema: Secondary | ICD-10-CM

## 2014-03-17 DIAGNOSIS — C342 Malignant neoplasm of middle lobe, bronchus or lung: Secondary | ICD-10-CM

## 2014-03-17 DIAGNOSIS — I2510 Atherosclerotic heart disease of native coronary artery without angina pectoris: Secondary | ICD-10-CM

## 2014-03-17 DIAGNOSIS — J439 Emphysema, unspecified: Secondary | ICD-10-CM

## 2014-03-17 NOTE — Assessment & Plan Note (Signed)
Stable on scheduled xopenex.

## 2014-03-17 NOTE — Assessment & Plan Note (Signed)
He low ejection fraction on Echo while in hospital in February.  He continues to have exertional chest pain.  He was scheduled to have cardiac cath, but this was deferred due to renal dysfunction.  I have advised him to call to schedule f/u with Dr. Marlou Porch.

## 2014-03-17 NOTE — Progress Notes (Signed)
Chief Complaint  Patient presents with  . COPD    Breathing is doing well since last OV.     History of Present Illness: Todd Villanueva is a 73 y.o. male former smoker with COPD/emphysema. S/p Rt middle and lower lobectomy, and chemoradiation therapy for Stage IIIA lung cancer.  He has improved since his hospital stay in February with influenza and pneumonia.    He uses xopenex bid, and this helps.  He is slowly weaning off prednisone per PCP.  She still gets chest discomfort when walking.  He is scheduled for f/u with Dr. Marlou Porch.  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  He  has past surgical history that includes Right VATS, Rt thoracotomy, Rt middle and Rt lower  Lobectomy (06/10/2011); Knee arthroscopy; Tonsillectomy; Coronary stent placement (JULY,AUGUST 2011); and ORIF femoral neck fracture w/ dhs.   Outpatient Encounter Prescriptions as of 03/17/2014  Medication Sig  . ALPRAZolam (XANAX) 0.25 MG tablet Take 1 tablet (0.25 mg total) by mouth 3 (three) times daily.  Marland Kitchen aspirin EC 81 MG EC tablet Take 1 tablet (81 mg total) by mouth daily.  . bevacizumab (AVASTIN) 1.25 mg/0.1 mL SOLN 1.25 mg by Intravitreal route every 30 (thirty) days.  . citalopram (CELEXA) 20 MG tablet Take 20 mg by mouth daily.    Marland Kitchen FeFum-FePoly-FA-B Cmp-C-Biot (FOLIVANE-PLUS) CAPS Take 1 capsule by mouth daily.  . furosemide (LASIX) 40 MG tablet Take 1 tablet (40 mg total) by mouth 3 (three) times a week.  . levalbuterol (XOPENEX) 0.63 MG/3ML nebulizer solution Take 0.63 mg by nebulization 3 (three)  times daily as needed for wheezing or shortness of breath.  Marland Kitchen omeprazole (PRILOSEC) 20 MG capsule Take 20 mg by mouth daily.    . predniSONE (DELTASONE) 10 MG tablet Take 2 tablets (20 mg total) by mouth daily with breakfast. Take 2 tablets daily for one week then one tablet daily for one week then discontinue  . simvastatin (ZOCOR) 40 MG tablet Take 40 mg by mouth every Monday, Wednesday, and Friday.   . temazepam (RESTORIL) 30 MG capsule Take 1 capsule (30 mg total) by mouth at bedtime as needed for sleep.    Allergies  Allergen Reactions  . Hydrocodone-Acetaminophen     REACTION: Itch  . Oxycodone Itching  . Tramadol     Pt states will not take med b/c it does not work   . Zolpidem Tartrate Other (See Comments)    nightmares    Physical Exam:  General - No distress ENT - No sinus tenderness, MP 4, no oral exudate, no LAN Cardiac - s1s2 regular, no murmur Chest - No wheeze/rales/dullness Back - No focal tenderness Abd - Soft, non-tender Ext - No edema Neuro - Normal strength Skin - No rashes Psych - normal mood, and behavior   Assessment/Plan:  [name], MD [facility] Pager:  [phone] 03/17/2014, 1:31 PM

## 2014-03-17 NOTE — Patient Instructions (Signed)
Follow up in 6 months 

## 2014-03-17 NOTE — Progress Notes (Signed)
[facility] [phone]   [phone]  OFFICE PROGRESS NOTE  [name], MD [address] [place] [address]  DIAGNOSIS: Stage IIB/IIIA non-small cell lung cancer, squamous cell carcinoma diagnosed in February of 2012.   PRIOR THERAPY:  1) Status post a course of concurrent chemoradiation with weekly carboplatin and paclitaxel. Last dose of chemotherapy was given on 03/21/2011.  2) status post right VATS, right thoracotomy, right middle and right lower lobectomies under the care of [name] on 06/10/2011.   CURRENT THERAPY:observation.   INTERVAL HISTORY: Todd Villanueva 73 y.o. male returns to the clinic today for six-month followup visit. The patient is feeling fine today with no specific complaints except for occasional pain in the right side of his chest. He was recently admitted to [facility] with acute on chronic respiratory failure and his skin associated pneumonia as well as COPD. He still in the hospital for around 24 days into admissions. He denied having any significant shortness of breath except with exertion, no cough or hemoptysis. The patient denied having any significant weight loss or night sweats. He has no nausea or vomiting. The patient had repeat CT scan of the chest performed recently and he is here for evaluation and discussion of his scan results.  MEDICAL HISTORY: Past Medical History  Diagnosis Date  . Hypertension   . lung ca dx'd 02/2011    xrt/chemo comp 03/30/11, stage IIIA  . Hyperlipidemia   . COPD with emphysema 12/23/2010    PFT 04/19/12>>FEV1 1.76 (65%), FEV1% 68, TLC 4.22 (70%), DLCO 37%, no BD      ALLERGIES:  is allergic to hydrocodone-acetaminophen; oxycodone; tramadol; and zolpidem tartrate.  MEDICATIONS:  Current Outpatient Prescriptions  Medication Sig Dispense Refill  . ALPRAZolam (XANAX) 0.25 MG tablet Take 1 tablet (0.25 mg total) by mouth 3 (three) times daily.   30 tablet  0  . aspirin EC 81 MG EC tablet Take 1 tablet (81 mg total) by mouth daily.  30 tablet  1  . bevacizumab (AVASTIN) 1.25 mg/0.1 mL SOLN 1.25 mg by Intravitreal route every 30 (thirty) days.      . citalopram (CELEXA) 20 MG tablet Take 20 mg by mouth daily.        Marland Kitchen FeFum-FePoly-FA-B Cmp-C-Biot (FOLIVANE-PLUS) CAPS Take 1 capsule by mouth daily.  90 capsule  1  . furosemide (LASIX) 40 MG tablet Take 1 tablet (40 mg total) by mouth 3 (three) times a week.  30 tablet  1  . levalbuterol (XOPENEX) 0.63 MG/3ML nebulizer solution Take 0.63 mg by nebulization 3 (three) times daily as needed for wheezing or shortness of breath.      Marland Kitchen omeprazole (PRILOSEC) 20 MG capsule Take 20 mg by mouth daily.        . predniSONE (DELTASONE) 10 MG tablet Take 2 tablets (20 mg total) by mouth daily with breakfast. Take 2 tablets daily for one week then one tablet daily for one week then discontinue  50 tablet  1  . simvastatin (ZOCOR) 40 MG tablet Take 40 mg by mouth every Monday, Wednesday, and Friday.       . temazepam (RESTORIL) 30 MG capsule Take 1 capsule (30 mg total) by mouth at bedtime as needed for sleep.  15 capsule  1   No current facility-administered medications for this visit.    SURGICAL HISTORY:  Past Surgical History  Procedure Laterality Date  . Right vats, rt thoracotomy,  rt middle and rt lower  lobectomy  06/10/2011    Dr Arlyce Dice  . Knee arthroscopy    . Tonsillectomy    . Coronary stent placement  JULY,AUGUST 2011  . Orif femoral neck fracture w/ dhs      REVIEW OF SYSTEMS:  A comprehensive review of systems was negative except for: Respiratory: positive for pleurisy/chest pain   PHYSICAL EXAMINATION: General appearance: alert, cooperative and no distress Head: Normocephalic, without obvious abnormality, atraumatic Neck: no adenopathy Lymph nodes: Cervical, supraclavicular, and axillary nodes normal. Resp: clear to auscultation bilaterally Cardio: regular rate and rhythm, S1,  S2 normal, no murmur, click, rub or gallop GI: soft, non-tender; bowel sounds normal; no masses,  no organomegaly Extremities: extremities normal, atraumatic, no cyanosis or edema  ECOG PERFORMANCE STATUS: 1 - Symptomatic but completely ambulatory  Blood pressure 142/90, pulse 107, temperature 97.4 F (36.3 C), temperature source Oral, resp. rate 18, height 5\' 7"  (1.702 m), weight 176 lb 11.2 oz (80.151 kg), SpO2 100.00%.  LABORATORY DATA: Lab Results  Component Value Date   WBC 9.5 03/10/2014   HGB 10.0* 03/10/2014   HCT 30.7* 03/10/2014   MCV 99.4* 03/10/2014   PLT 194 03/10/2014      Chemistry      Component Value Date/Time   NA 146* 03/10/2014 0811   NA 137 02/05/2014 0545   NA 142 01/10/2012 1205   K 3.7 03/10/2014 0811   K 5.0 02/05/2014 0545   K 3.9 01/10/2012 1205   CL 97 02/05/2014 0545   CL 107 10/15/2012 1521   CL 102 01/10/2012 1205   CO2 25 03/10/2014 0811   CO2 25 02/05/2014 0545   CO2 26 01/10/2012 1205   BUN 31.4* 03/10/2014 0811   BUN 89* 02/05/2014 0545   BUN 27* 01/10/2012 1205   CREATININE 1.5* 03/10/2014 0811   CREATININE 2.66* 02/05/2014 0545   CREATININE 1.1 01/10/2012 1205      Component Value Date/Time   CALCIUM 9.1 03/10/2014 0811   CALCIUM 9.4 02/05/2014 0545   CALCIUM 8.8 01/10/2012 1205   ALKPHOS 135 03/10/2014 0811   ALKPHOS 156* 01/28/2014 0510   ALKPHOS 271* 01/10/2012 1205   AST 27 03/10/2014 0811   AST 25 01/28/2014 0510   AST 28 01/10/2012 1205   ALT 28 03/10/2014 0811   ALT 26 01/28/2014 0510   ALT 28 01/10/2012 1205   BILITOT 0.59 03/10/2014 0811   BILITOT 0.4 01/28/2014 0510   BILITOT 0.60 01/10/2012 1205       RADIOGRAPHIC STUDIES: Ct Chest W Contrast  03/11/2014   CLINICAL DATA:  History of lung cancer. Staging. Question recurrence of tumor.  EXAM: CT CHEST WITH CONTRAST  TECHNIQUE: Multidetector CT imaging of the chest was performed during intravenous contrast administration.  CONTRAST:  57mL OMNIPAQUE IOHEXOL 350 MG/ML SOLN  COMPARISON:  DG CHEST 1V PORT dated 02/01/2014; CT ANGIO  CHEST W/CM &/OR WO/CM dated 01/27/2014; DG CHEST 1V PORT dated 01/29/2014; DG CHEST 1V PORT dated 01/30/2014; CT CHEST W/CM dated 08/12/2013  FINDINGS: Postoperative changes from right middle and lower lobectomy. Airspace opacity noted around the suture line in the right lower hemithorax. Small right pleural effusion. These findings are stable since prior CT and stable compared with September of 2014 study.  Mild centrilobular and paraseptal emphysema. No left pleural effusion. Coarsened interstitial opacities peripherally within the left lung are stable, likely early fibrosis. No confluent opacity on the left.  Shift of the heart and mediastinal structures to the right compatible  with volume loss from prior partial pneumonectomy. Heart is borderline enlarged. Dense coronary artery calcifications are noted. Aortic calcifications without aneurysm.  High right paratracheal lymph node measures 10 mm in short axis diameter compared to 1.5 cm previously. No new or enlarging mediastinal or hilar lymph nodes. No axillary adenopathy. Chest wall soft tissues are unremarkable. Imaging into the upper abdomen shows no acute findings.  Severe compression fracture of the T7 vertebral body, stable. Postsurgical changes in the right posterolateral ribs. No new or suspicious osseous abnormality.  IMPRESSION: Stable appearance in the right lower hemithorax with airspace opacity around the suture line and small right pleural effusion. In fact, the opacity is stable dating back to September of 2014 suggesting post treatment changes. No convincing evidence for tumor recurrence.  COPD/chronic changes in the left lung.  High right paratracheal lymph node decreased in size since prior study.  Coronary artery disease.   Electronically Signed   By: Rolm Baptise M.D.   On: 03/11/2014 13:22    ASSESSMENT AND PLAN: This is a very pleasant 73 years old white male with history of stage IIB/IIIa non-small cell lung cancer status post concurrent  chemoradiation followed by right middle and right lower lobectomies and has been observation since July of 2002 with no evidence for disease recurrence. I discussed the scan results with the patient. I recommended for him to continue on observation with repeat CT scan of the chest in 6 months. He was advised to call immediately if he has any concerning symptoms in the interval. The patient voices understanding of current disease status and treatment options and is in agreement with the current care plan.  All questions were answered. The patient knows to call the clinic with any problems, questions or concerns. We can certainly see the patient much sooner if necessary.  Disclaimer: This note was dictated with voice recognition software. Similar sounding words can inadvertently be transcribed and may not be corrected upon review.

## 2014-03-17 NOTE — Assessment & Plan Note (Signed)
Stable on most recent CT chest.

## 2014-03-19 ENCOUNTER — Encounter (INDEPENDENT_AMBULATORY_CARE_PROVIDER_SITE_OTHER): Payer: 59 | Admitting: Ophthalmology

## 2014-03-19 ENCOUNTER — Telehealth: Payer: Self-pay | Admitting: Internal Medicine

## 2014-03-19 DIAGNOSIS — H43819 Vitreous degeneration, unspecified eye: Secondary | ICD-10-CM

## 2014-03-19 DIAGNOSIS — I10 Essential (primary) hypertension: Secondary | ICD-10-CM

## 2014-03-19 DIAGNOSIS — H348192 Central retinal vein occlusion, unspecified eye, stable: Secondary | ICD-10-CM

## 2014-03-19 DIAGNOSIS — H35039 Hypertensive retinopathy, unspecified eye: Secondary | ICD-10-CM

## 2014-03-19 NOTE — Telephone Encounter (Signed)
s.w. pt and advised on OCT appt....pt ok and aware °

## 2014-03-20 ENCOUNTER — Encounter: Payer: Self-pay | Admitting: Cardiology

## 2014-04-16 ENCOUNTER — Encounter (INDEPENDENT_AMBULATORY_CARE_PROVIDER_SITE_OTHER): Payer: 59 | Admitting: Ophthalmology

## 2014-04-16 DIAGNOSIS — H43819 Vitreous degeneration, unspecified eye: Secondary | ICD-10-CM

## 2014-04-16 DIAGNOSIS — I10 Essential (primary) hypertension: Secondary | ICD-10-CM

## 2014-04-16 DIAGNOSIS — H348192 Central retinal vein occlusion, unspecified eye, stable: Secondary | ICD-10-CM

## 2014-04-16 DIAGNOSIS — H35039 Hypertensive retinopathy, unspecified eye: Secondary | ICD-10-CM

## 2014-04-22 ENCOUNTER — Other Ambulatory Visit: Payer: Self-pay | Admitting: *Deleted

## 2014-04-22 DIAGNOSIS — C3490 Malignant neoplasm of unspecified part of unspecified bronchus or lung: Secondary | ICD-10-CM

## 2014-04-22 MED ORDER — FOLIVANE-PLUS PO CAPS: 1.0000 | ORAL_CAPSULE | Freq: Every day | ORAL | Status: DC

## 2014-04-25 ENCOUNTER — Ambulatory Visit: Payer: 59 | Admitting: Cardiology

## 2014-05-02 ENCOUNTER — Encounter: Payer: Self-pay | Admitting: Cardiology

## 2014-05-07 ENCOUNTER — Encounter: Payer: Self-pay | Admitting: Cardiology

## 2014-05-07 ENCOUNTER — Ambulatory Visit (INDEPENDENT_AMBULATORY_CARE_PROVIDER_SITE_OTHER): Payer: 59 | Admitting: Cardiology

## 2014-05-07 ENCOUNTER — Encounter: Payer: Self-pay | Admitting: *Deleted

## 2014-05-07 VITALS — BP 144/83 | HR 96 | Ht 66.5 in | Wt 185.0 lb

## 2014-05-07 DIAGNOSIS — I10 Essential (primary) hypertension: Secondary | ICD-10-CM

## 2014-05-07 DIAGNOSIS — J449 Chronic obstructive pulmonary disease, unspecified: Secondary | ICD-10-CM

## 2014-05-07 DIAGNOSIS — I208 Other forms of angina pectoris: Secondary | ICD-10-CM

## 2014-05-07 DIAGNOSIS — I2510 Atherosclerotic heart disease of native coronary artery without angina pectoris: Secondary | ICD-10-CM

## 2014-05-07 DIAGNOSIS — Z8679 Personal history of other diseases of the circulatory system: Secondary | ICD-10-CM

## 2014-05-07 NOTE — Addendum Note (Signed)
Addended by: Jerline Pain on: 05/07/2014 03:49 PM   Modules accepted: Orders

## 2014-05-07 NOTE — Patient Instructions (Addendum)
Your physician has requested that you have a cardiac catheterization. Cardiac catheterization is used to diagnose and/or treat various heart conditions. Doctors may recommend this procedure for a number of different reasons. The most common reason is to evaluate chest pain. Chest pain can be a symptom of coronary artery disease (CAD), and cardiac catheterization can show whether plaque is narrowing or blocking your heart's arteries. This procedure is also used to evaluate the valves, as well as measure the blood flow and oxygen levels in different parts of your heart. For further information please visit [email or web address]. Please follow instruction sheet, as given. PLEASE HOLD LASIX MORNING OF CATH  Your physician recommends that you return for lab work TODAY:CBC,BMET,INR  Your physician recommends that you continue on your current medications as directed. Please refer to the Current Medication list given to you today.  Your physician recommends that you schedule a follow-up appointment in: [place] DR. Marlou Porch

## 2014-05-07 NOTE — Progress Notes (Signed)
[facility]. [address] [place], [place]  [address] Phone: [phone] Fax:  [phone]  Date:  05/07/2014   ID:  Lamoyne, Hessel 1941/10/24, MRN [id number]  PCP:  [name], MD   History of Present Illness: Todd Villanueva is a 73 y.o. male with stage III lung cancer, COPD, with ischemic cardiomyopathy ejection fraction 54-56%, grade 1 diastolic dysfunction here for followup of anginal like symptoms. He continues to have chest discomfort with moderate walking.  Previously we discussed the possibility of cardiac catheterization but we were concerned about his creatinine/renal function causing complication. Previously he had stent placement in 2011 RCA and LAD. Prior NUC stress 5/14 low risk with no ischemia.  He also had prior rectus sheath hematoma.   Creat 1.5 from 2.7  Wt Readings from Last 3 Encounters:  05/07/14 185 lb (83.915 kg)  03/17/14 178 lb (80.74 kg)  03/17/14 176 lb 11.2 oz (80.151 kg)     Past Medical History  Diagnosis Date  . Hypertension   . lung ca dx'd 02/2011    xrt/chemo comp 03/30/11, stage IIIA  . Hyperlipidemia   . 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 Right vats, rt thoracotomy, rt middle and rt lower  lobectomy  06/10/2011    Dr Arlyce Dice  . Knee arthroscopy    . Tonsillectomy    . Coronary stent placement  JULY,AUGUST 2011  . Orif femoral neck fracture w/ dhs      Current Outpatient Prescriptions  Medication Sig Dispense Refill  . ALPRAZolam (XANAX) 0.25 MG tablet Take 1 tablet (0.25 mg total) by mouth 3 (three) times daily.  30 tablet  0  . aspirin EC 81 MG EC tablet Take 1 tablet (81 mg total) by mouth daily.  30 tablet  1  . bevacizumab (AVASTIN) 1.25 mg/0.1 mL SOLN 1.25 mg by Intravitreal route every 30 (thirty) days.      . citalopram (CELEXA) 20 MG tablet Take 20 mg by mouth daily.        Marland Kitchen  FeFum-FePoly-FA-B Cmp-C-Biot (FOLIVANE-PLUS) CAPS Take 1 capsule by mouth daily.  90 capsule  1  . furosemide (LASIX) 40 MG tablet Take 1 tablet (40 mg total) by mouth 3 (three) times a week.  30 tablet  1  . ISOSORBIDE PO Take by mouth daily.      Marland Kitchen levalbuterol (XOPENEX) 0.63 MG/3ML nebulizer solution Take 0.63 mg by nebulization 2 (two) times daily.       Marland Kitchen omeprazole (PRILOSEC) 20 MG capsule Take 20 mg by mouth daily.        . simvastatin (ZOCOR) 40 MG tablet Take 40 mg by mouth every Monday, Wednesday, and Friday.       . temazepam (RESTORIL) 30 MG capsule Take 1 capsule (30 mg total) by mouth at bedtime as needed for sleep.  15 capsule  1  . predniSONE (DELTASONE) 10 MG tablet Take 20 mg by mouth daily with breakfast.       No current facility-administered medications for this visit.    Allergies:    Allergies  Allergen Reactions  . Hydrocodone-Acetaminophen     REACTION: Itch  . Oxycodone Itching  . Tramadol     Pt states will not take med b/c it does not work   . Zolpidem Tartrate Other (See  Comments)    nightmares    Social History:  The patient  reports that he quit smoking about 7 months ago. His smoking use included Cigarettes. He has a 26.5 pack-year smoking history. He has never used smokeless tobacco. He reports that he drinks alcohol. He reports that he does not use illicit drugs.   Family History  Problem Relation Age of Onset  . Cancer Father     multiple myeloma  . COPD Sister     emphysema- smoked  . Cancer Brother     multiple myeloma    ROS:  Please see the history of present illness.  Shortness of breath has improved. Continues to have bandlike anginal symptoms. Coronary artery disease. No syncope. Multiple bruises.   All other systems reviewed and negative.   PHYSICAL EXAM: VS:  BP 144/83  Pulse 96  Ht 5' 6.5" (1.689 m)  Wt 185 lb (83.915 kg)  BMI 29.42 kg/m2 Well nourished, well developed, in no acute distress HEENT: normal, [place]/AT, EOMI Neck: no  JVD, normal carotid upstroke, no bruit Cardiac:  normal S1, S2; RRR; no murmur Lungs:  Minimal wheezing with decreased air movement,no  rhonchi or rales Abd: soft, nontender, no hepatomegaly, no bruits Ext: no edema, 2+ distal pulses excellent radial pulse Skin: warm and dryBruising noted GU: deferred Neuro: no focal abnormalities noted, AAO x 3  EKG:  01/27/14-sinus tachycardia rate 120.  ASSESSMENT AND PLAN:  1. Angina-continued exertional anginal symptoms with mild to moderate exertion and previous RCA/LAD stent, I will proceed with heart catheterization, radial artery approach for further delineation of coronary anatomy. Risks and benefits discussed including renal failure, stroke, heart attack, death. 2. CAD-as above. 3. COPD-recent pneumonia, hospitalization. He was quite ill. He is recovered well. 4. Hypertension-currently reasonably controlled. 5. Chronic kidney disease stage III-creatinine currently 1.5. Hydration prior to procedure. Careful with ejection fraction in the 40% range.  Signed, Candee Furbish, MD [facility]  05/07/2014 3:37 PM

## 2014-05-08 ENCOUNTER — Encounter: Payer: Self-pay | Admitting: Cardiology

## 2014-05-08 ENCOUNTER — Encounter: Payer: Self-pay | Admitting: Pulmonary Disease

## 2014-05-08 LAB — CBC WITH DIFFERENTIAL/PLATELET
Basophils Absolute: 0 10*3/uL (ref 0.0–0.1)
Basophils Relative: 0.2 % (ref 0.0–3.0)
EOS PCT: 0.8 % (ref 0.0–5.0)
Eosinophils Absolute: 0.1 10*3/uL (ref 0.0–0.7)
HEMATOCRIT: 31.9 % — AB (ref 39.0–52.0)
HEMOGLOBIN: 10.6 g/dL — AB (ref 13.0–17.0)
LYMPHS ABS: 2 10*3/uL (ref 0.7–4.0)
LYMPHS PCT: 23.7 % (ref 12.0–46.0)
MCHC: 33.2 g/dL (ref 30.0–36.0)
MCV: 99.3 fl (ref 78.0–100.0)
MONOS PCT: 6.9 % (ref 3.0–12.0)
Monocytes Absolute: 0.6 10*3/uL (ref 0.1–1.0)
NEUTROS ABS: 5.7 10*3/uL (ref 1.4–7.7)
Neutrophils Relative %: 68.4 % (ref 43.0–77.0)
Platelets: 209 10*3/uL (ref 150.0–400.0)
RBC: 3.22 Mil/uL — AB (ref 4.22–5.81)
RDW: 14.1 % (ref 11.5–15.5)
WBC: 8.4 10*3/uL (ref 4.0–10.5)

## 2014-05-08 NOTE — Telephone Encounter (Signed)
Pt was only seen one time by Dr. Melvyn Novas for sick visit.  I have followed Todd Villanueva since 2013.  He should have f/u appt with me.

## 2014-05-08 NOTE — Telephone Encounter (Signed)
Pt is wanting to stick with [name] for both sleep and pulmonary problems. He is not wanting to see MW as well. Please advise [name] if this is okay. Thanks

## 2014-05-08 NOTE — Telephone Encounter (Signed)
Spoke with patient. Advised that if he did not keep 6/16 date for the heart cath then he would have to wait until 7/17 for Dr.Skains to be available which is too long to wait. Offered him another physician but he declined and decided to stay with the 6/16 date.

## 2014-05-09 NOTE — Telephone Encounter (Addendum)
Dr [name], pt wanted to make you aware that Dr. Marlou Porch is doing a Cath on him on June 16th at 730 am.

## 2014-05-12 ENCOUNTER — Encounter: Payer: Self-pay | Admitting: Cardiology

## 2014-05-12 ENCOUNTER — Other Ambulatory Visit (INDEPENDENT_AMBULATORY_CARE_PROVIDER_SITE_OTHER): Payer: 59

## 2014-05-12 DIAGNOSIS — I2510 Atherosclerotic heart disease of native coronary artery without angina pectoris: Secondary | ICD-10-CM

## 2014-05-12 LAB — BASIC METABOLIC PANEL
BUN: 27 mg/dL — ABNORMAL HIGH (ref 6–23)
CALCIUM: 9.2 mg/dL (ref 8.4–10.5)
CO2: 27 mEq/L (ref 19–32)
CREATININE: 1.6 mg/dL — AB (ref 0.4–1.5)
Chloride: 101 mEq/L (ref 96–112)
GFR: 44.61 mL/min — AB (ref >60.00)
Glucose, Bld: 94 mg/dL (ref 70–99)
Potassium: 4.1 mEq/L (ref 3.5–5.1)
SODIUM: 138 meq/L (ref 135–145)

## 2014-05-12 LAB — PROTIME-INR
INR: 1.1 ratio — AB (ref 0.8–1.0)
PROTHROMBIN TIME: 11.7 s (ref 9.6–13.1)

## 2014-05-12 NOTE — Telephone Encounter (Signed)
Returned call to pt after checking chart review & with Lanny Hurst. There were no results for the PT/INR or the BMP that was collected on 05/07/14 Pt is in town today & will come in for a redraw of these labs that are precath labs. Horton Chin RN

## 2014-05-13 ENCOUNTER — Encounter: Payer: Self-pay | Admitting: Cardiology

## 2014-05-16 ENCOUNTER — Encounter: Payer: Self-pay | Admitting: Cardiology

## 2014-05-16 ENCOUNTER — Telehealth: Payer: Self-pay | Admitting: Cardiology

## 2014-05-16 NOTE — Telephone Encounter (Signed)
New Message  Pt called requests a call back to discuss which medications he can or cannot take before his cath procedure//SR

## 2014-05-16 NOTE — Telephone Encounter (Signed)
Called patient back. He will take Nexium and Xanax with a little water prior to cath.

## 2014-05-19 ENCOUNTER — Encounter: Payer: Self-pay | Admitting: Cardiology

## 2014-05-19 ENCOUNTER — Telehealth: Payer: Self-pay | Admitting: Cardiology

## 2014-05-19 NOTE — Telephone Encounter (Signed)
Todd Villanueva is aware pt requested to cancel cath until after 05/28/14 Will forward to Dr. Lacie Scotts RN

## 2014-05-19 NOTE — Telephone Encounter (Signed)
New Prob    Pt is requesting to cancel cath appointment and reschedule sometime next week. Represtative from cath lab is calling over to make sure this is ok. Please call.

## 2014-05-19 NOTE — Telephone Encounter (Signed)
Spoke with Todd Villanueva would states he has to cancel his cath for tomorrow because he does not have coverage at work to be out.  One coworker had an MI and is at [facility] and the other one is on vacation.  He only wants Dr Marlou Porch to do the procedure.  Todd Villanueva is aware I will discuss with Dr Marlou Porch as to when he can do the procedure after 6/24.

## 2014-05-20 ENCOUNTER — Encounter (HOSPITAL_COMMUNITY): Admission: RE | Payer: Self-pay | Source: Ambulatory Visit

## 2014-05-20 ENCOUNTER — Ambulatory Visit (HOSPITAL_COMMUNITY): Admission: RE | Admit: 2014-05-20 | Payer: 59 | Source: Ambulatory Visit | Admitting: Cardiology

## 2014-05-20 SURGERY — LEFT HEART CATHETERIZATION WITH CORONARY ANGIOGRAM

## 2014-05-28 ENCOUNTER — Encounter (INDEPENDENT_AMBULATORY_CARE_PROVIDER_SITE_OTHER): Payer: 59 | Admitting: Ophthalmology

## 2014-05-28 DIAGNOSIS — I10 Essential (primary) hypertension: Secondary | ICD-10-CM

## 2014-05-28 DIAGNOSIS — H348192 Central retinal vein occlusion, unspecified eye, stable: Secondary | ICD-10-CM

## 2014-05-28 DIAGNOSIS — H43819 Vitreous degeneration, unspecified eye: Secondary | ICD-10-CM

## 2014-05-28 DIAGNOSIS — H35039 Hypertensive retinopathy, unspecified eye: Secondary | ICD-10-CM

## 2014-05-29 ENCOUNTER — Encounter: Payer: Self-pay | Admitting: Cardiology

## 2014-05-29 ENCOUNTER — Encounter: Payer: Self-pay | Admitting: Pulmonary Disease

## 2014-05-29 MED ORDER — PREDNISONE 20 MG PO TABS: 20.0000 mg | ORAL_TABLET | Freq: Every day | ORAL | Status: DC

## 2014-05-29 NOTE — Addendum Note (Signed)
Addended by: Rosana Berger on: 05/29/2014 05:07 PM   Modules accepted: Orders

## 2014-05-29 NOTE — Telephone Encounter (Signed)
Pt c/o increased wheezing, SOB increased with activity and pain in chest(lower rib cage). Patient states that his chest only hurts when he stands up--feels like a stretching sensation.  Pt reports that he is supposed to be discussing with VS at future appt having a Bronchoscopy done? Recall in computer to see Dr [name] 09/2014-- patient wishes to wait and see Dr [name] in October. Pt refused appts offered for tomorrow. Pt states that he is using his nebulizer TID Using Pulmicort 179mcg BID   Allergies  Allergen Reactions  . Hydrocodone-Acetaminophen     REACTION: Itch  . Oxycodone Itching  . Tramadol     Pt states will not take med b/c it does not work   . Zolpidem Tartrate Other (See Comments)    nightmares   Please advise Dr Annamaria Boots as Dr [name] not in office. Pt would like recs on decreasing wheezing. thanks

## 2014-05-29 NOTE — Telephone Encounter (Signed)
Offer prednisone 20 mg, # 6, 1 daily. Contact Dr [name] next week for further advice.

## 2014-05-29 NOTE — Telephone Encounter (Signed)
Spoke with the pt and notified of recs  Rx was sent to pharm

## 2014-05-30 NOTE — Telephone Encounter (Signed)
Called pt to find out if he is available to come for heart catheterization on 7/6, with IVF for hydration in the AM and cath possibly early afternoon. That is a good day for him. He prefers to have Dr. Marlou Porch do the procedure.

## 2014-06-02 ENCOUNTER — Encounter: Payer: Self-pay | Admitting: *Deleted

## 2014-06-02 ENCOUNTER — Telehealth: Payer: Self-pay | Admitting: Cardiology

## 2014-06-02 ENCOUNTER — Telehealth: Payer: Self-pay | Admitting: *Deleted

## 2014-06-02 DIAGNOSIS — Z01812 Encounter for preprocedural laboratory examination: Secondary | ICD-10-CM

## 2014-06-02 NOTE — Telephone Encounter (Signed)
Patient scheduled for left heart cath 06/17/14. Pt aware and in agreement. Pre procedure labs to be drawn next tue 06/10/14. Letter of instructions printed and put in mail for patient today. Staff message sent to Dr. Marlou Porch to inform.

## 2014-06-02 NOTE — Telephone Encounter (Signed)
New message        Pt is on 20mg  prednisone for 6 days starting fromSaturday / should pt come in for blood work on the 7th? Will it affect his liver?

## 2014-06-02 NOTE — Telephone Encounter (Signed)
Pt aware OK to take prednisone and OK to have blood work as scheduled.  He should call back if further questions.  Aware I will call back with the results.

## 2014-06-04 ENCOUNTER — Encounter: Payer: Self-pay | Admitting: Pulmonary Disease

## 2014-06-04 NOTE — Telephone Encounter (Signed)
Pt advised via telephone  Using Xopenex as directed--this has been added to medication list Pt advised to use Prednisone as directed -- contact our office if any further problems with breathing after finishing Prednisone Nothing further needed.

## 2014-06-04 NOTE — Telephone Encounter (Signed)
He was previously on xopenex.  I don't see this in his medication list.  If he is still wheezing then he should have refill for xopenex.  He should not need additional prednisone after finishing script from Dr. Annamaria Boots.

## 2014-06-04 NOTE — Telephone Encounter (Signed)
Dr [name] please advise on patient e-mail as he has concerns with his breathing and his prednisone regimen. Pt received Prednisone taper 05/29/14 take 20mg  qd x 6 days Pt asking if he needs to just stop the prednisone "cold [place]" once finished with the 6th day or if he needs to taper down to low dose.   Please advise thanks.  (Dr [name] paged)

## 2014-06-10 ENCOUNTER — Other Ambulatory Visit (INDEPENDENT_AMBULATORY_CARE_PROVIDER_SITE_OTHER): Payer: 59

## 2014-06-10 DIAGNOSIS — Z01812 Encounter for preprocedural laboratory examination: Secondary | ICD-10-CM

## 2014-06-11 ENCOUNTER — Telehealth: Payer: Self-pay | Admitting: Cardiology

## 2014-06-11 LAB — BASIC METABOLIC PANEL
BUN: 36 mg/dL — AB (ref 6–23)
CHLORIDE: 105 meq/L (ref 96–112)
CO2: 26 meq/L (ref 19–32)
Calcium: 8.9 mg/dL (ref 8.4–10.5)
Creatinine, Ser: 1.8 mg/dL — ABNORMAL HIGH (ref 0.4–1.5)
GFR: 40.82 mL/min — ABNORMAL LOW (ref >60.00)
Glucose, Bld: 135 mg/dL — ABNORMAL HIGH (ref 70–99)
Potassium: 4.1 mEq/L (ref 3.5–5.1)
Sodium: 139 mEq/L (ref 135–145)

## 2014-06-11 LAB — CBC WITH DIFFERENTIAL/PLATELET
Basophils Absolute: 0 10*3/uL (ref 0.0–0.1)
Basophils Relative: 0.1 % (ref 0.0–3.0)
EOS ABS: 0.1 10*3/uL (ref 0.0–0.7)
Eosinophils Relative: 1.1 % (ref 0.0–5.0)
HEMATOCRIT: 32.7 % — AB (ref 39.0–52.0)
Hemoglobin: 11.2 g/dL — ABNORMAL LOW (ref 13.0–17.0)
LYMPHS ABS: 1.3 10*3/uL (ref 0.7–4.0)
Lymphocytes Relative: 16.6 % (ref 12.0–46.0)
MCHC: 34.2 g/dL (ref 30.0–36.0)
MCV: 97.4 fl (ref 78.0–100.0)
MONO ABS: 0.2 10*3/uL (ref 0.1–1.0)
Monocytes Relative: 2.2 % — ABNORMAL LOW (ref 3.0–12.0)
NEUTROS PCT: 80 % — AB (ref 43.0–77.0)
Neutro Abs: 6.2 10*3/uL (ref 1.4–7.7)
PLATELETS: 186 10*3/uL (ref 150.0–400.0)
RBC: 3.35 Mil/uL — ABNORMAL LOW (ref 4.22–5.81)
RDW: 13.9 % (ref 11.5–15.5)
WBC: 7.7 10*3/uL (ref 4.0–10.5)

## 2014-06-11 LAB — APTT: aPTT: 29.6 s — ABNORMAL HIGH (ref 21.7–28.8)

## 2014-06-11 LAB — PROTIME-INR
INR: 1 ratio (ref 0.8–1.0)
Prothrombin Time: 11.1 s (ref 9.6–13.1)

## 2014-06-11 NOTE — Telephone Encounter (Signed)
New message      Pt said nurse needed meds  Isosorbide 20mg  Vu desonide 180mg  Folivine 40mg  Xanax 0.25mg  Baby Aspirin 81mg  Furosemide 40mg  1 tablet 3 times a week Temazepam 30mg  Lev albuterol 0.63mg /ml 2 times daily Bevacizunab every 30 days 1.25mg /33mL Simvastatin 40mg  on M/W/F Citalopram 20mg 

## 2014-06-11 NOTE — Telephone Encounter (Signed)
Noted and updated.  

## 2014-06-13 ENCOUNTER — Encounter (HOSPITAL_COMMUNITY): Payer: Self-pay | Admitting: Pharmacy Technician

## 2014-06-17 ENCOUNTER — Encounter (HOSPITAL_COMMUNITY)
Admission: RE | Disposition: A | Discharge status: Home / Self Care | Payer: 59 | Source: Ambulatory Visit | Attending: Cardiology

## 2014-06-17 ENCOUNTER — Ambulatory Visit (HOSPITAL_COMMUNITY)
Admission: RE | Admit: 2014-06-17 | Discharge: 2014-06-17 | Disposition: A | Discharge status: Home / Self Care | Payer: 59 | Source: Ambulatory Visit | Attending: Cardiology | Admitting: Cardiology

## 2014-06-17 DIAGNOSIS — J438 Other emphysema: Secondary | ICD-10-CM | POA: Insufficient documentation

## 2014-06-17 DIAGNOSIS — I129 Hypertensive chronic kidney disease with stage 1 through stage 4 chronic kidney disease, or unspecified chronic kidney disease: Secondary | ICD-10-CM | POA: Insufficient documentation

## 2014-06-17 DIAGNOSIS — I428 Other cardiomyopathies: Secondary | ICD-10-CM

## 2014-06-17 DIAGNOSIS — I2583 Coronary atherosclerosis due to lipid rich plaque: Secondary | ICD-10-CM

## 2014-06-17 DIAGNOSIS — N183 Chronic kidney disease, stage 3 unspecified: Secondary | ICD-10-CM | POA: Insufficient documentation

## 2014-06-17 DIAGNOSIS — Z87891 Personal history of nicotine dependence: Secondary | ICD-10-CM | POA: Insufficient documentation

## 2014-06-17 DIAGNOSIS — I2589 Other forms of chronic ischemic heart disease: Secondary | ICD-10-CM | POA: Insufficient documentation

## 2014-06-17 DIAGNOSIS — I2510 Atherosclerotic heart disease of native coronary artery without angina pectoris: Secondary | ICD-10-CM | POA: Insufficient documentation

## 2014-06-17 DIAGNOSIS — R109 Unspecified abdominal pain: Secondary | ICD-10-CM

## 2014-06-17 DIAGNOSIS — I2119 ST elevation (STEMI) myocardial infarction involving other coronary artery of inferior wall: Secondary | ICD-10-CM

## 2014-06-17 DIAGNOSIS — Z923 Personal history of irradiation: Secondary | ICD-10-CM | POA: Insufficient documentation

## 2014-06-17 DIAGNOSIS — I252 Old myocardial infarction: Secondary | ICD-10-CM | POA: Insufficient documentation

## 2014-06-17 DIAGNOSIS — I255 Ischemic cardiomyopathy: Secondary | ICD-10-CM | POA: Diagnosis present

## 2014-06-17 DIAGNOSIS — E785 Hyperlipidemia, unspecified: Secondary | ICD-10-CM | POA: Insufficient documentation

## 2014-06-17 DIAGNOSIS — Z85118 Personal history of other malignant neoplasm of bronchus and lung: Secondary | ICD-10-CM | POA: Insufficient documentation

## 2014-06-17 DIAGNOSIS — R079 Chest pain, unspecified: Secondary | ICD-10-CM | POA: Diagnosis present

## 2014-06-17 DIAGNOSIS — I209 Angina pectoris, unspecified: Secondary | ICD-10-CM | POA: Insufficient documentation

## 2014-06-17 DIAGNOSIS — Z9221 Personal history of antineoplastic chemotherapy: Secondary | ICD-10-CM | POA: Insufficient documentation

## 2014-06-17 DIAGNOSIS — Y849 Medical procedure, unspecified as the cause of abnormal reaction of the patient, or of later complication, without mention of misadventure at the time of the procedure: Secondary | ICD-10-CM | POA: Insufficient documentation

## 2014-06-17 DIAGNOSIS — I519 Heart disease, unspecified: Secondary | ICD-10-CM | POA: Insufficient documentation

## 2014-06-17 HISTORY — PX: LEFT HEART CATHETERIZATION WITH CORONARY ANGIOGRAM: SHX5451

## 2014-06-17 SURGERY — LEFT HEART CATHETERIZATION WITH CORONARY ANGIOGRAM
Anesthesia: LOCAL

## 2014-06-17 MED ORDER — ASPIRIN 81 MG PO CHEW
CHEWABLE_TABLET | ORAL | Status: AC
  Administered 2014-06-17: 81 mg via ORAL
  Filled 2014-06-17: qty 1

## 2014-06-17 MED ORDER — SODIUM CHLORIDE 0.9 % IV SOLN: 1.0000 mL/kg/h | INTRAVENOUS | Status: DC

## 2014-06-17 MED ORDER — SODIUM CHLORIDE 0.9 % IJ SOLN: 3.0000 mL | INTRAMUSCULAR | Status: DC | PRN

## 2014-06-17 MED ORDER — HEPARIN (PORCINE) IN NACL 2-0.9 UNIT/ML-% IJ SOLN
INTRAMUSCULAR | Status: AC
  Filled 2014-06-17: qty 500

## 2014-06-17 MED ORDER — ASPIRIN 81 MG PO CHEW
81.0000 mg | CHEWABLE_TABLET | ORAL | Status: AC
  Administered 2014-06-17: 81 mg via ORAL

## 2014-06-17 MED ORDER — FENTANYL CITRATE 0.05 MG/ML IJ SOLN
INTRAMUSCULAR | Status: AC
  Filled 2014-06-17: qty 2

## 2014-06-17 MED ORDER — HEPARIN SODIUM (PORCINE) 1000 UNIT/ML IJ SOLN
INTRAMUSCULAR | Status: AC
  Filled 2014-06-17: qty 1

## 2014-06-17 MED ORDER — LIDOCAINE HCL (PF) 1 % IJ SOLN
INTRAMUSCULAR | Status: AC
  Filled 2014-06-17: qty 30

## 2014-06-17 MED ORDER — VERAPAMIL HCL 2.5 MG/ML IV SOLN
INTRAVENOUS | Status: AC
  Filled 2014-06-17: qty 2

## 2014-06-17 MED ORDER — SODIUM CHLORIDE 0.9 % IV SOLN: 250.0000 mL | INTRAVENOUS | Status: DC | PRN

## 2014-06-17 MED ORDER — SODIUM CHLORIDE 0.9 % IV SOLN
1.0000 mL/kg/h | INTRAVENOUS | Status: DC
  Administered 2014-06-17: 1 mL/kg/h via INTRAVENOUS

## 2014-06-17 MED ORDER — SODIUM CHLORIDE 0.9 % IJ SOLN: 3.0000 mL | Freq: Two times a day (BID) | INTRAMUSCULAR | Status: DC

## 2014-06-17 MED ORDER — HEPARIN (PORCINE) IN NACL 2-0.9 UNIT/ML-% IJ SOLN
INTRAMUSCULAR | Status: AC
  Filled 2014-06-17: qty 1000

## 2014-06-17 MED ORDER — MIDAZOLAM HCL 2 MG/2ML IJ SOLN
INTRAMUSCULAR | Status: AC
  Filled 2014-06-17: qty 2

## 2014-06-17 NOTE — CV Procedure (Signed)
    CARDIAC CATHETERIZATION  PROCEDURE:  Left heart catheterization with selective coronary angiography, left heart pressures, subclavian angiogram via the radial artery approach.  INDICATIONS:  New diagnosis of cardiomyopathy with new inferior wall infarct pattern on nuclear stress test with prior non-ST elevation myocardial infarction in the setting of acute respiratory failure with chronic kidney disease. He was brought in early for IV hydration.  The risks, benefits, and details of the procedure were explained to the patient, including possibilities of stroke, heart attack, death, renal impairment, arterial damage, bleeding.  The patient verbalized understanding and wanted to proceed.  Informed written consent was obtained.  PROCEDURE TECHNIQUE:   The right radial artery site was prepped and draped in a sterile fashion. One percent lidocaine was used for local anesthesia. Using the modified Seldinger technique a 5 French hydrophilic sheath was inserted into the radial artery without difficulty. 3 mg of verapamil was administered via the sheath. A Judkins right #4 catheter with the guidance of a Versicore wire was placed in the right coronary cusp and selectively cannulated the right coronary artery. There was dense calcification at the bend of the right subclavian artery. Judkins left #4 catheter did not successfully pass around this calcified ridge. An EBU guide catheter was successfully passed with J-wire support. Poor of both catheters was challenging given the subclavian stenosis. Pressures were normal pre lesion. After traversing the aortic arch, 4000 units of heparin IV was administered. EBU catheter was used to selectively cannulate the left main artery. Multiple views with hand injection of Omnipaque were obtained. Judkins right catheter crossed into the left ventricle, hemodynamics were obtained. Following selective shots of the left coronary system, a selective subclavian angiogram was  performed with hand injection. Following the procedure, sheath was removed, patient was hemodynamically stable, hemostasis was maintained with a Terumo T band.   CONTRAST:  Total of 60 ml.    FLOUROSCOPY TIME: 8.8 min.  COMPLICATIONS:  None.    HEMODYNAMICS:  Aortic pressure was 353/61WERX; LV systolic pressure was 540GQQP; LVEDP 27mmHg.  There was no gradient between the left ventricle and aorta.    ANGIOGRAPHIC DATA:    Left main: Widely patent branching into the LAD and circumflex artery.  Left anterior descending (LAD): Previously placed LAD stent is widely patent with 2 diagonal branches. Distal LAD is fairly narrow in caliber.  Circumflex artery (CIRC): 2 obtuse marginal branches. No significant hemodynamic stenosis. Left to right collaterals noted.  Right coronary artery (RCA): Flush occlusion of distal mid RCA stent with left to right collateral, excellent. Posterior-lateral stent appears patent from collateral blood flow. Chronic total occlusion.  Subclavian angiogram-right, densely calcified subclavian stenosis. Able to successfully pass Judkins right catheter without difficulty. Pressures pre-lesion were 619 systolic. There did not appear to be a significant hemodynamic gradient  LEFT VENTRICULOGRAM:  Not performed secondary to dye sparing procedure.   IMPRESSIONS:  Occluded mid RCA stent at its distal origin with excellent left-to-right collateral blood flow and patent posterior lateral stent. Previously placed LAD stent is widely patent. LVEDP 12 mmHg.  prior ejection fraction 35-40% on echocardiogram 01/28/14. However, ejection fraction was 39% on nuclear stress test on 04/12/13, with mid to distal inferior infarct pattern correlating with current mid RCA stent finding.  RECOMMENDATION:  Continue with aggressive medical management..  We will see back in one month.

## 2014-06-17 NOTE — H&P (Signed)
[facility]. [address], [address] [place], [place] [address]  Phone: [phone]  Fax: [phone]  Date: 05/07/2014  ID: Todd Villanueva May 13, 1941, MRN [id number]  PCP: Todd Screws, MD  History of Present Illness:  Todd Villanueva is a 73 y.o. male with stage III lung cancer, COPD, with ischemic cardiomyopathy ejection fraction 21-19%, grade 1 diastolic dysfunction here for followup of anginal like symptoms. He continues to have chest discomfort with moderate walking. Previously we discussed the possibility of cardiac catheterization but we were concerned about his creatinine/renal function causing complication. Previously he had stent placement in 2011 RCA and LAD. Prior NUC stress 5/14 low risk with no ischemia.  He also had prior rectus sheath hematoma.  Creat 1.5 from 2.7  Wt Readings from Last 3 Encounters:   05/07/14  185 lb (83.915 kg)   03/17/14  178 lb (80.74 kg)   03/17/14  176 lb 11.2 oz (80.151 kg)    Past Medical History   Diagnosis  Date   .  Hypertension    .  lung ca  dx'd 02/2011     xrt/chemo comp 03/30/11, stage IIIA   .  Hyperlipidemia    .  COPD with emphysema  12/23/2010     PFT 04/19/12>>FEV1 1.76 (65%), FEV1% 68, TLC 4.22 (70%), DLCO 37%, no BD    Past Surgical History   Procedure  Laterality  Date   .  Right vats, rt thoracotomy, rt middle and rt lower lobectomy   06/10/2011     Dr Arlyce Dice   .  Knee arthroscopy     .  Tonsillectomy     .  Coronary stent placement   JULY,AUGUST 2011   .  Orif femoral neck fracture w/ dhs      Current Outpatient Prescriptions   Medication  Sig  Dispense  Refill   .  ALPRAZolam (XANAX) 0.25 MG tablet  Take 1 tablet (0.25 mg total) by mouth 3 (three) times daily.  30 tablet  0   .  aspirin EC 81 MG EC tablet  Take 1 tablet (81 mg total) by mouth daily.  30 tablet  1   .  bevacizumab (AVASTIN) 1.25 mg/0.1 mL SOLN  1.25 mg by Intravitreal route every 30 (thirty) days.     .  citalopram (CELEXA) 20 MG tablet  Take 20 mg  by mouth daily.     Marland Kitchen  FeFum-FePoly-FA-B Cmp-C-Biot (FOLIVANE-PLUS) CAPS  Take 1 capsule by mouth daily.  90 capsule  1   .  furosemide (LASIX) 40 MG tablet  Take 1 tablet (40 mg total) by mouth 3 (three) times a week.  30 tablet  1   .  ISOSORBIDE PO  Take by mouth daily.     Marland Kitchen  levalbuterol (XOPENEX) 0.63 MG/3ML nebulizer solution  Take 0.63 mg by nebulization 2 (two) times daily.     Marland Kitchen  omeprazole (PRILOSEC) 20 MG capsule  Take 20 mg by mouth daily.     .  simvastatin (ZOCOR) 40 MG tablet  Take 40 mg by mouth every Monday, Wednesday, and Friday.     .  temazepam (RESTORIL) 30 MG capsule  Take 1 capsule (30 mg total) by mouth at bedtime as needed for sleep.  15 capsule  1   .  predniSONE (DELTASONE) 10 MG tablet  Take 20 mg by mouth daily with breakfast.      No current facility-administered medications for this visit.   Allergies:  Allergies  Allergen  Reactions   .  Hydrocodone-Acetaminophen      REACTION: Itch   .  Oxycodone  Itching   .  Tramadol      Pt states will not take med b/c it does not work   .  Zolpidem Tartrate  Other (See Comments)     nightmares   Social History: The patient reports that he quit smoking about 7 months ago. His smoking use included Cigarettes. He has a 26.5 pack-year smoking history. He has never used smokeless tobacco. He reports that he drinks alcohol. He reports that he does not use illicit drugs.  Family History   Problem  Relation  Age of Onset   .  Cancer  Father      multiple myeloma   .  COPD  Sister      emphysema- smoked   .  Cancer  Brother      multiple myeloma   ROS: Please see the history of present illness. Shortness of breath has improved. Continues to have bandlike anginal symptoms. Coronary artery disease. No syncope. Multiple bruises. All other systems reviewed and negative.  PHYSICAL EXAM:  VS: BP 144/83  Pulse 96  Ht 5' 6.5" (1.689 m)  Wt 185 lb (83.915 kg)  BMI 29.42 kg/m2  Well nourished, well developed, in no acute  distress  HEENT: normal, [place]/AT, EOMI  Neck: no JVD, normal carotid upstroke, no bruit  Cardiac: normal S1, S2; RRR; no murmur  Lungs: Minimal wheezing with decreased air movement,no rhonchi or rales  Abd: soft, nontender, no hepatomegaly, no bruits  Ext: no edema, 2+ distal pulses excellent radial pulse  Skin: warm and dry Bruising noted  GU: deferred  Neuro: no focal abnormalities noted, AAO x 3  EKG: 01/27/14-sinus tachycardia rate 120.  ASSESSMENT AND PLAN:  1. Angina-continued exertional anginal symptoms with mild to moderate exertion and previous RCA/LAD stent, I will proceed with heart catheterization, radial artery approach for further delineation of coronary anatomy. Risks and benefits discussed including renal failure, stroke, heart attack, death. 2. CAD-as above. 3. COPD-recent pneumonia, hospitalization. He was quite ill. He is recovered well. 4. Hypertension-currently reasonably controlled. 5. Chronic kidney disease stage III-creatinine currently 1.5. Hydration prior to procedure. Careful with ejection fraction in the 40% range. Signed,  Candee Furbish, MD [facility]  05/07/2014 3:37 PM    Agree with above.  Here for hydration prior to cath.  Proceed.   Candee Furbish, MD

## 2014-06-17 NOTE — Discharge Instructions (Signed)
Radial Site Care °Refer to this sheet in the next few weeks. These instructions provide you with information on caring for yourself after your procedure. Your caregiver may also give you more specific instructions. Your treatment has been planned according to current medical practices, but problems sometimes occur. Call your caregiver if you have any problems or questions after your procedure. °HOME CARE INSTRUCTIONS °· You may shower the day after the procedure. Remove the bandage (dressing) and gently wash the site with plain soap and water. Gently pat the site dry. °· Do not apply powder or lotion to the site. °· Do not submerge the affected site in water for 3 to 5 days. °· Inspect the site at least twice daily. °· Do not flex or bend the affected arm for 24 hours. °· No lifting over 5 pounds (2.3 kg) for 5 days after your procedure. °· Do not drive home if you are discharged the same day of the procedure. Have someone else drive you. °· You may drive 24 hours after the procedure unless otherwise instructed by your caregiver. °· Do not operate machinery or power tools for 24 hours. °· A responsible adult should be with you for the first 24 hours after you arrive home. °What to expect: °· Any bruising will usually fade within 1 to 2 weeks. °· Blood that collects in the tissue (hematoma) may be painful to the touch. It should usually decrease in size and tenderness within 1 to 2 weeks. °SEEK IMMEDIATE MEDICAL CARE IF: °· You have unusual pain at the radial site. °· You have redness, warmth, swelling, or pain at the radial site. °· You have drainage (other than a small amount of blood on the dressing). °· You have chills. °· You have a fever or persistent symptoms for more than 72 hours. °· You have a fever and your symptoms suddenly get worse. °· Your arm becomes pale, cool, tingly, or numb. °· You have heavy bleeding from the site. Hold pressure on the site. °Document Released: 12/24/2010 Document Revised:  02/13/2012 Document Reviewed: 12/24/2010 °ExitCare® Patient Information ©2015 [employer]. This information is not intended to replace advice given to you by your health care provider. Make sure you discuss any questions you have with your health care provider. ° °

## 2014-06-18 ENCOUNTER — Ambulatory Visit: Payer: 59 | Admitting: Cardiology

## 2014-06-24 ENCOUNTER — Encounter (INDEPENDENT_AMBULATORY_CARE_PROVIDER_SITE_OTHER): Payer: 59 | Admitting: Ophthalmology

## 2014-06-24 DIAGNOSIS — H43819 Vitreous degeneration, unspecified eye: Secondary | ICD-10-CM

## 2014-06-24 DIAGNOSIS — H35039 Hypertensive retinopathy, unspecified eye: Secondary | ICD-10-CM

## 2014-06-24 DIAGNOSIS — H348192 Central retinal vein occlusion, unspecified eye, stable: Secondary | ICD-10-CM

## 2014-06-24 DIAGNOSIS — I10 Essential (primary) hypertension: Secondary | ICD-10-CM

## 2014-07-18 ENCOUNTER — Encounter (INDEPENDENT_AMBULATORY_CARE_PROVIDER_SITE_OTHER): Payer: 59 | Admitting: Ophthalmology

## 2014-07-23 ENCOUNTER — Encounter (INDEPENDENT_AMBULATORY_CARE_PROVIDER_SITE_OTHER): Payer: 59 | Admitting: Ophthalmology

## 2014-07-31 ENCOUNTER — Encounter (INDEPENDENT_AMBULATORY_CARE_PROVIDER_SITE_OTHER): Payer: 59 | Admitting: Ophthalmology

## 2014-07-31 DIAGNOSIS — H348192 Central retinal vein occlusion, unspecified eye, stable: Secondary | ICD-10-CM

## 2014-07-31 DIAGNOSIS — H35039 Hypertensive retinopathy, unspecified eye: Secondary | ICD-10-CM

## 2014-07-31 DIAGNOSIS — H43819 Vitreous degeneration, unspecified eye: Secondary | ICD-10-CM

## 2014-07-31 DIAGNOSIS — I10 Essential (primary) hypertension: Secondary | ICD-10-CM

## 2014-08-06 ENCOUNTER — Encounter: Payer: Self-pay | Admitting: Pulmonary Disease

## 2014-08-27 ENCOUNTER — Encounter (INDEPENDENT_AMBULATORY_CARE_PROVIDER_SITE_OTHER): Payer: 59 | Admitting: Ophthalmology

## 2014-08-27 DIAGNOSIS — I10 Essential (primary) hypertension: Secondary | ICD-10-CM

## 2014-08-27 DIAGNOSIS — H348192 Central retinal vein occlusion, unspecified eye, stable: Secondary | ICD-10-CM

## 2014-08-27 DIAGNOSIS — H43819 Vitreous degeneration, unspecified eye: Secondary | ICD-10-CM

## 2014-08-27 DIAGNOSIS — H35039 Hypertensive retinopathy, unspecified eye: Secondary | ICD-10-CM

## 2014-09-10 ENCOUNTER — Encounter: Payer: Self-pay | Admitting: Internal Medicine

## 2014-09-15 ENCOUNTER — Other Ambulatory Visit (HOSPITAL_BASED_OUTPATIENT_CLINIC_OR_DEPARTMENT_OTHER): Payer: 59

## 2014-09-15 ENCOUNTER — Ambulatory Visit (HOSPITAL_COMMUNITY): Payer: 59

## 2014-09-15 DIAGNOSIS — Z85118 Personal history of other malignant neoplasm of bronchus and lung: Secondary | ICD-10-CM

## 2014-09-15 DIAGNOSIS — C3490 Malignant neoplasm of unspecified part of unspecified bronchus or lung: Secondary | ICD-10-CM

## 2014-09-15 LAB — CBC WITH DIFFERENTIAL/PLATELET
BASO%: 0.6 % (ref 0.0–2.0)
BASOS ABS: 0 10*3/uL (ref 0.0–0.1)
EOS%: 1.2 % (ref 0.0–7.0)
Eosinophils Absolute: 0.1 10*3/uL (ref 0.0–0.5)
HCT: 35.1 % — ABNORMAL LOW (ref 38.4–49.9)
HGB: 11.6 g/dL — ABNORMAL LOW (ref 13.0–17.1)
LYMPH%: 16.1 % (ref 14.0–49.0)
MCH: 32.2 pg (ref 27.2–33.4)
MCHC: 32.9 g/dL (ref 32.0–36.0)
MCV: 97.9 fL (ref 79.3–98.0)
MONO#: 0.7 10*3/uL (ref 0.1–0.9)
MONO%: 9.8 % (ref 0.0–14.0)
NEUT#: 5.3 10*3/uL (ref 1.5–6.5)
NEUT%: 72.3 % (ref 39.0–75.0)
PLATELETS: 162 10*3/uL (ref 140–400)
RBC: 3.59 10*6/uL — ABNORMAL LOW (ref 4.20–5.82)
RDW: 14.5 % (ref 11.0–14.6)
WBC: 7.4 10*3/uL (ref 4.0–10.3)
lymph#: 1.2 10*3/uL (ref 0.9–3.3)

## 2014-09-15 LAB — COMPREHENSIVE METABOLIC PANEL (CC13)
ALBUMIN: 3.5 g/dL (ref 3.5–5.0)
ALT: 20 U/L (ref 0–55)
AST: 22 U/L (ref 5–34)
Alkaline Phosphatase: 129 U/L (ref 40–150)
Anion Gap: 11 mEq/L (ref 3–11)
BUN: 31.6 mg/dL — ABNORMAL HIGH (ref 7.0–26.0)
CALCIUM: 9.6 mg/dL (ref 8.4–10.4)
CHLORIDE: 109 meq/L (ref 98–109)
CO2: 24 mEq/L (ref 22–29)
CREATININE: 1.5 mg/dL — AB (ref 0.7–1.3)
Glucose: 105 mg/dl (ref 70–140)
POTASSIUM: 3.8 meq/L (ref 3.5–5.1)
Sodium: 144 mEq/L (ref 136–145)
Total Bilirubin: 0.42 mg/dL (ref 0.20–1.20)
Total Protein: 6.9 g/dL (ref 6.4–8.3)

## 2014-09-17 ENCOUNTER — Ambulatory Visit (HOSPITAL_COMMUNITY)
Admission: RE | Admit: 2014-09-17 | Discharge: 2014-09-17 | Disposition: A | Discharge status: Home / Self Care | Payer: 59 | Source: Ambulatory Visit | Attending: Internal Medicine | Admitting: Internal Medicine

## 2014-09-17 ENCOUNTER — Ambulatory Visit (HOSPITAL_COMMUNITY): Payer: 59

## 2014-09-17 DIAGNOSIS — C7802 Secondary malignant neoplasm of left lung: Secondary | ICD-10-CM | POA: Insufficient documentation

## 2014-09-17 DIAGNOSIS — C787 Secondary malignant neoplasm of liver and intrahepatic bile duct: Secondary | ICD-10-CM | POA: Diagnosis not present

## 2014-09-17 DIAGNOSIS — C3491 Malignant neoplasm of unspecified part of right bronchus or lung: Secondary | ICD-10-CM | POA: Insufficient documentation

## 2014-09-17 DIAGNOSIS — Z9221 Personal history of antineoplastic chemotherapy: Secondary | ICD-10-CM | POA: Diagnosis not present

## 2014-09-17 DIAGNOSIS — Z902 Acquired absence of lung [part of]: Secondary | ICD-10-CM | POA: Insufficient documentation

## 2014-09-17 DIAGNOSIS — Z923 Personal history of irradiation: Secondary | ICD-10-CM | POA: Diagnosis not present

## 2014-09-17 DIAGNOSIS — I2510 Atherosclerotic heart disease of native coronary artery without angina pectoris: Secondary | ICD-10-CM | POA: Insufficient documentation

## 2014-09-17 DIAGNOSIS — C3490 Malignant neoplasm of unspecified part of unspecified bronchus or lung: Secondary | ICD-10-CM

## 2014-09-18 ENCOUNTER — Encounter: Payer: Self-pay | Admitting: Pulmonary Disease

## 2014-09-18 ENCOUNTER — Ambulatory Visit (INDEPENDENT_AMBULATORY_CARE_PROVIDER_SITE_OTHER): Payer: 59 | Admitting: Pulmonary Disease

## 2014-09-18 VITALS — BP 150/78 | HR 105 | Temp 98.0°F | Ht 66.0 in | Wt 190.8 lb

## 2014-09-18 DIAGNOSIS — J439 Emphysema, unspecified: Secondary | ICD-10-CM

## 2014-09-18 DIAGNOSIS — I208 Other forms of angina pectoris: Secondary | ICD-10-CM

## 2014-09-18 DIAGNOSIS — R0982 Postnasal drip: Secondary | ICD-10-CM

## 2014-09-18 DIAGNOSIS — M940 Chondrocostal junction syndrome [Tietze]: Secondary | ICD-10-CM | POA: Insufficient documentation

## 2014-09-18 MED ORDER — FLUTICASONE PROPIONATE 50 MCG/ACT NA SUSP: 2.0000 | Freq: Every day | NASAL | Status: DC

## 2014-09-18 NOTE — Progress Notes (Signed)
Chief Complaint  Patient presents with  . Follow-up    Pt c/o increased SOB. Pt concerned of possible cancer, recent CT scan showed multiple spots on liver.     History of Present Illness: Todd Villanueva is a 73 y.o. male former smoker with COPD/emphysema. S/p Rt middle and lower lobectomy, and chemoradiation therapy for Stage IIIA lung cancer.  His has been getting nasal congestion with post-nasal drip.    He is not having trouble with his breathing.  He denies wheeze.  symbicort helps.  He remains on prednisone.  He gets soreness in his ribs when he lays flat or moves his upper chest.  He had CT chest on 09/17/14 which showed mediastinal LAN and liver lesion.  He is due for f/u with Dr. Julien Nordmann next week.  TESTS: Echo 02/15/12>>EF 55% PFT 04/19/12>>FEV1 1.76 (65%), FEV1% 68, TLC 4.22 (70%), DLCO 37%, no BD CT chest 10/10/12>>emphysema, 6 mm nodule LLL, no change consolidative change Rt lung base Echo 01/28/14 >> EF 35 to 82%, grade 1 diastolic dysfx CT chest 03/27/52 >> centrilobular/paraseptal emphysema, Rt paratracheal LAN 1.5 cm  PMHx, PSHx, Medications, Allergies, Fhx, Shx reviewed.  Physical Exam:  General - No distress ENT - No sinus tenderness, MP 4, no oral exudate, no LAN Cardiac - s1s2 regular, no murmur Chest - No wheeze/rales/dullness Back - No focal tenderness Abd - Soft, non-tender Ext - No edema Neuro - Normal strength Skin - No rashes Psych - normal mood, and behavior   Assessment/Plan:  [name], MD [facility] Pager:  [phone] 09/18/2014, 1:45 PM

## 2014-09-18 NOTE — Assessment & Plan Note (Signed)
Prn analgesics.

## 2014-09-18 NOTE — Assessment & Plan Note (Signed)
Continue prednisone and symbicort.

## 2014-09-18 NOTE — Assessment & Plan Note (Signed)
Recent CT chest concerning for recurrence.  He is to f/u with oncology next week.

## 2014-09-18 NOTE — Patient Instructions (Signed)
Flonase two sprays each nostril daily Follow up in 6 months

## 2014-09-18 NOTE — Assessment & Plan Note (Signed)
He is to try flonase.

## 2014-09-22 ENCOUNTER — Ambulatory Visit (HOSPITAL_BASED_OUTPATIENT_CLINIC_OR_DEPARTMENT_OTHER): Payer: 59 | Admitting: Internal Medicine

## 2014-09-22 ENCOUNTER — Telehealth: Payer: Self-pay | Admitting: Internal Medicine

## 2014-09-22 VITALS — BP 128/78 | HR 108 | Temp 98.2°F | Resp 18 | Ht 66.0 in | Wt 189.1 lb

## 2014-09-22 DIAGNOSIS — R0600 Dyspnea, unspecified: Secondary | ICD-10-CM

## 2014-09-22 DIAGNOSIS — J439 Emphysema, unspecified: Secondary | ICD-10-CM

## 2014-09-22 DIAGNOSIS — C3431 Malignant neoplasm of lower lobe, right bronchus or lung: Secondary | ICD-10-CM

## 2014-09-22 DIAGNOSIS — Z85118 Personal history of other malignant neoplasm of bronchus and lung: Secondary | ICD-10-CM

## 2014-09-22 DIAGNOSIS — C787 Secondary malignant neoplasm of liver and intrahepatic bile duct: Secondary | ICD-10-CM

## 2014-09-22 DIAGNOSIS — D538 Other specified nutritional anemias: Secondary | ICD-10-CM

## 2014-09-22 NOTE — Progress Notes (Signed)
[place] Telephone:([phone]   Fax:(336) [phone]  OFFICE PROGRESS NOTE  [name], MD [address]  [place] [address]  DIAGNOSIS: Stage IIB/IIIA non-small cell lung cancer, squamous cell carcinoma diagnosed in February of 2012.   PRIOR THERAPY:  1) Status post a course of concurrent chemoradiation with weekly carboplatin and paclitaxel. Last dose of chemotherapy was given on 03/21/2011.  2) status post right VATS, right thoracotomy, right middle and right lower lobectomies under the care of [name] on 06/10/2011.   CURRENT THERAPY: Observation.  INTERVAL HISTORY: Todd Villanueva 73 y.o. male returns to the clinic today for six-month followup visit. The patient continues to complain of increasing shortness of breath especially with exertion. 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 He denied having any significant shortness of breath except with exertion, no cough or hemoptysis. The patient denied having any significant weight loss or night sweats. He has no nausea or vomiting. The patient had repeat CT scan of the chest performed recently and he is here for evaluation and discussion of his scan results.  MEDICAL HISTORY: Past Medical History  Diagnosis Date  . Hypertension   . lung ca dx'd 02/2011    xrt/chemo comp 03/30/11, stage IIIA  . Hyperlipidemia   . 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 History of angina   . CAD (coronary artery disease)     ALLERGIES:  is allergic to hydrocodone-acetaminophen; tramadol; and zolpidem tartrate.  MEDICATIONS:  Current Outpatient Prescriptions  Medication Sig Dispense Refill  . ALPRAZolam (XANAX) 0.25 MG tablet Take 0.25 mg by mouth 2 (two) times daily as needed for anxiety.       Marland Kitchen aspirin EC 81 MG EC tablet Take 1 tablet (81 mg total) by mouth daily.  30 tablet  1  . bevacizumab  (AVASTIN) 1.25 mg/0.1 mL SOLN 1.25 mg by Intravitreal route every 30 (thirty) days.      . budesonide-formoterol (SYMBICORT) 160-4.5 MCG/ACT inhaler Inhale 2 puffs into the lungs 2 (two) times daily.      . citalopram (CELEXA) 20 MG tablet Take 20 mg by mouth daily.        . fluticasone (FLONASE) 50 MCG/ACT nasal spray Place 2 sprays into both nostrils daily.  16 g  2  . furosemide (LASIX) 40 MG tablet Take 1 tablet (40 mg total) by mouth 3 (three) times a week.  30 tablet  1  . isosorbide mononitrate (IMDUR) 30 MG 24 hr tablet Take 30 mg by mouth daily.      Marland Kitchen levalbuterol (XOPENEX) 0.63 MG/3ML nebulizer solution Take 0.63 mg by nebulization 2 (two) times daily.       Marland Kitchen omeprazole (PRILOSEC) 20 MG capsule Take 20 mg by mouth daily.        . simvastatin (ZOCOR) 40 MG tablet Take 40 mg by mouth every Monday, Wednesday, and Friday.        No current facility-administered medications for this visit.    SURGICAL HISTORY:  Past Surgical History  Procedure Laterality Date  . Right vats, rt thoracotomy, rt middle and rt lower  lobectomy  06/10/2011    Dr Arlyce Dice  . Knee arthroscopy    . Tonsillectomy    . Coronary stent placement  JULY,AUGUST 2011  . Orif femoral neck fracture w/ dhs  REVIEW OF SYSTEMS:  A comprehensive review of systems was negative except for: Respiratory: positive for dyspnea on exertion   PHYSICAL EXAMINATION: General appearance: alert, cooperative and no distress Head: Normocephalic, without obvious abnormality, atraumatic Neck: no adenopathy Lymph nodes: Cervical, supraclavicular, and axillary nodes normal. Resp: clear to auscultation bilaterally Cardio: regular rate and rhythm, S1, S2 normal, no murmur, click, rub or gallop GI: soft, non-tender; bowel sounds normal; no masses,  no organomegaly Extremities: extremities normal, atraumatic, no cyanosis or edema  ECOG PERFORMANCE STATUS: 1 - Symptomatic but completely ambulatory  Blood pressure 128/78, pulse 108,  temperature 98.2 F (36.8 C), temperature source Oral, resp. rate 18, height 5\' 6"  (1.676 m), weight 189 lb 1.6 oz (85.775 kg), SpO2 97.00%.  LABORATORY DATA: Lab Results  Component Value Date   WBC 7.4 09/15/2014   HGB 11.6* 09/15/2014   HCT 35.1* 09/15/2014   MCV 97.9 09/15/2014   PLT 162 09/15/2014      Chemistry      Component Value Date/Time   NA 144 09/15/2014 0822   NA 139 06/10/2014 1611   NA 142 01/10/2012 1205   K 3.8 09/15/2014 0822   K 4.1 06/10/2014 1611   K 3.9 01/10/2012 1205   CL 105 06/10/2014 1611   CL 107 10/15/2012 1521   CL 102 01/10/2012 1205   CO2 24 09/15/2014 0822   CO2 26 06/10/2014 1611   CO2 26 01/10/2012 1205   BUN 31.6* 09/15/2014 0822   BUN 36* 06/10/2014 1611   BUN 27* 01/10/2012 1205   CREATININE 1.5* 09/15/2014 0822   CREATININE 1.8* 06/10/2014 1611   CREATININE 1.1 01/10/2012 1205      Component Value Date/Time   CALCIUM 9.6 09/15/2014 0822   CALCIUM 8.9 06/10/2014 1611   CALCIUM 8.8 01/10/2012 1205   ALKPHOS 129 09/15/2014 0822   ALKPHOS 156* 01/28/2014 0510   ALKPHOS 271* 01/10/2012 1205   AST 22 09/15/2014 0822   AST 25 01/28/2014 0510   AST 28 01/10/2012 1205   ALT 20 09/15/2014 0822   ALT 26 01/28/2014 0510   ALT 28 01/10/2012 1205   BILITOT 0.42 09/15/2014 0822   BILITOT 0.4 01/28/2014 0510   BILITOT 0.60 01/10/2012 1205       RADIOGRAPHIC STUDIES: Ct Chest Wo Contrast  09/17/2014   CLINICAL DATA:  Subsequent encounter, Malignant lung neoplasm C34.90 (ICD-10-CM). History right lung cancer, status post resection, chemotherapy and radiation therapy. Tightness across the mid to lower chest bilaterally.  EXAM: CT CHEST WITHOUT CONTRAST  TECHNIQUE: Multidetector CT imaging of the chest was performed following the standard protocol without IV contrast.  COMPARISON:  03/11/2014.  FINDINGS: High right paratracheal lymph node measures 10 mm, stable. AP window and left hilar adenopathy is new. Index AP window lymph node measures 1.3 cm (image 20) and index left hilar  lymph node measures 1.9 cm (image 25). Additional left hilar adenopathy is difficult to discern without IV contrast. No axillary adenopathy. Atherosclerotic calcification of the arterial vasculature with extensive 3 vessel coronary artery calcification. Heart is enlarged. No pericardial effusion.  Postoperative changes are seen in the right hemi thorax with associated volume loss. Postradiation consolidation, bronchiectasis and volume loss in the lower right hemi thorax appear stable. Small chronic right pleural fluid collection is also unchanged. New pleural nodularity throughout the left hemi thorax. Index lesion along the lower left anterior mediastinal border measures 1.1 x 1.7 cm (series 3, image 24). Left lower lobe nodule measures 5 x 8 mm, likely  stable (image 39). Trace new left pleural fluid. Centrilobular and paraseptal emphysema. Airway is otherwise unremarkable.  Incidental imaging of the upper abdomen shows at least 3 new subtle low-attenuation lesions in the liver, measuring up to 1.8 cm in the dome (image 40). Visualized portions of the gallbladder, right adrenal gland, right kidney, spleen and stomach are otherwise grossly unremarkable. No upper abdominal adenopathy.  Multiple old right rib fractures and likely thoracotomy changes. Old left rib fractures. Mid thoracic compression fracture is seen at approximately the T7 level, stable.  IMPRESSION: 1. Findings consistent with progression of metastatic lung cancer, as evidenced by new mediastinal/left hilar adenopathy, new left pleural nodularity and new hepatic metastases. 2. Postoperative and postradiation changes in the right hemi thorax with a small right fibrothorax, stable. 3. Three-vessel coronary artery calcification. 4. Trace new left pleural fluid.   Electronically Signed   By: Lorin Picket M.D.   On: 09/17/2014 09:49   ASSESSMENT AND PLAN: This is a very pleasant 73 years old white male with history of stage IIB/IIIa non-small cell  lung cancer status post concurrent chemoradiation followed by right middle and right lower lobectomies and has been observation since July of 2002. The recent CT scan of the chest showed concerning finding for disease progression with new mediastinal/left hilar adenopathy as well as new left pleural nodularity and new hepatic metastases. I personally reviewed the scan and discuss the results with the patient today. I explained to him that there is a high concern for disease recurrence and I recommended for him to have repeat imaging studies for evaluation of his disease including a PET scan as well as MRI of the brain. I would see him back for followup visit in 2 weeks for evaluation and discussion of his imaging studies and further recommendation regarding treatment of his condition. We may need to consider him for repeat biopsy for confirmation of the disease recurrence. He was advised to call immediately if he has any concerning symptoms in the interval. The patient voices understanding of current disease status and treatment options and is in agreement with the current care plan.  All questions were answered. The patient knows to call the clinic with any problems, questions or concerns. We can certainly see the patient much sooner if necessary.  Disclaimer: This note was dictated with voice recognition software. Similar sounding words can inadvertently be transcribed and may not be corrected upon review.

## 2014-09-22 NOTE — Telephone Encounter (Signed)
Pt confirmed MD 10/19 POF, gave pt AVS.... KJ

## 2014-09-30 ENCOUNTER — Ambulatory Visit (HOSPITAL_COMMUNITY)
Admission: RE | Admit: 2014-09-30 | Discharge: 2014-09-30 | Disposition: A | Discharge status: Home / Self Care | Payer: 59 | Source: Ambulatory Visit | Attending: Internal Medicine | Admitting: Internal Medicine

## 2014-09-30 ENCOUNTER — Encounter (HOSPITAL_COMMUNITY): Payer: Self-pay

## 2014-09-30 DIAGNOSIS — D538 Other specified nutritional anemias: Secondary | ICD-10-CM

## 2014-09-30 DIAGNOSIS — C787 Secondary malignant neoplasm of liver and intrahepatic bile duct: Secondary | ICD-10-CM | POA: Insufficient documentation

## 2014-09-30 DIAGNOSIS — C3402 Malignant neoplasm of left main bronchus: Secondary | ICD-10-CM | POA: Diagnosis not present

## 2014-09-30 DIAGNOSIS — C7951 Secondary malignant neoplasm of bone: Secondary | ICD-10-CM | POA: Insufficient documentation

## 2014-09-30 DIAGNOSIS — J439 Emphysema, unspecified: Secondary | ICD-10-CM

## 2014-09-30 DIAGNOSIS — C772 Secondary and unspecified malignant neoplasm of intra-abdominal lymph nodes: Secondary | ICD-10-CM | POA: Insufficient documentation

## 2014-09-30 DIAGNOSIS — C771 Secondary and unspecified malignant neoplasm of intrathoracic lymph nodes: Secondary | ICD-10-CM | POA: Diagnosis not present

## 2014-09-30 DIAGNOSIS — C782 Secondary malignant neoplasm of pleura: Secondary | ICD-10-CM | POA: Insufficient documentation

## 2014-09-30 DIAGNOSIS — C3431 Malignant neoplasm of lower lobe, right bronchus or lung: Secondary | ICD-10-CM

## 2014-09-30 DIAGNOSIS — R0600 Dyspnea, unspecified: Secondary | ICD-10-CM

## 2014-09-30 LAB — GLUCOSE, CAPILLARY: Glucose-Capillary: 83 mg/dL (ref 70–99)

## 2014-09-30 MED ORDER — FLUDEOXYGLUCOSE F - 18 (FDG) INJECTION
9.4700 | Freq: Once | INTRAVENOUS | Status: AC | PRN
  Administered 2014-09-30: 9.47 via INTRAVENOUS

## 2014-10-01 ENCOUNTER — Ambulatory Visit (HOSPITAL_COMMUNITY): Payer: 59

## 2014-10-01 ENCOUNTER — Encounter (INDEPENDENT_AMBULATORY_CARE_PROVIDER_SITE_OTHER): Payer: 59 | Admitting: Ophthalmology

## 2014-10-02 ENCOUNTER — Encounter (INDEPENDENT_AMBULATORY_CARE_PROVIDER_SITE_OTHER): Payer: 59 | Admitting: Ophthalmology

## 2014-10-02 ENCOUNTER — Encounter: Payer: Self-pay | Admitting: Internal Medicine

## 2014-10-03 ENCOUNTER — Ambulatory Visit (HOSPITAL_COMMUNITY)
Admission: RE | Admit: 2014-10-03 | Discharge: 2014-10-03 | Disposition: A | Discharge status: Home / Self Care | Payer: 59 | Source: Ambulatory Visit | Attending: Radiology | Admitting: Radiology

## 2014-10-03 DIAGNOSIS — D538 Other specified nutritional anemias: Secondary | ICD-10-CM | POA: Insufficient documentation

## 2014-10-03 DIAGNOSIS — R0600 Dyspnea, unspecified: Secondary | ICD-10-CM

## 2014-10-03 DIAGNOSIS — C3431 Malignant neoplasm of lower lobe, right bronchus or lung: Secondary | ICD-10-CM | POA: Insufficient documentation

## 2014-10-03 DIAGNOSIS — I6782 Cerebral ischemia: Secondary | ICD-10-CM | POA: Insufficient documentation

## 2014-10-03 DIAGNOSIS — G319 Degenerative disease of nervous system, unspecified: Secondary | ICD-10-CM | POA: Diagnosis not present

## 2014-10-03 DIAGNOSIS — J439 Emphysema, unspecified: Secondary | ICD-10-CM | POA: Insufficient documentation

## 2014-10-03 MED ORDER — GADOBENATE DIMEGLUMINE 529 MG/ML IV SOLN
19.0000 mL | Freq: Once | INTRAVENOUS | Status: AC | PRN
  Administered 2014-10-03: 19 mL via INTRAVENOUS

## 2014-10-07 ENCOUNTER — Other Ambulatory Visit: Payer: Self-pay | Admitting: Internal Medicine

## 2014-10-07 ENCOUNTER — Encounter: Payer: Self-pay | Admitting: Internal Medicine

## 2014-10-07 ENCOUNTER — Ambulatory Visit (HOSPITAL_BASED_OUTPATIENT_CLINIC_OR_DEPARTMENT_OTHER): Payer: 59 | Admitting: Internal Medicine

## 2014-10-07 VITALS — BP 135/81 | HR 112 | Temp 97.8°F | Resp 19 | Ht 66.0 in | Wt 189.1 lb

## 2014-10-07 DIAGNOSIS — J438 Other emphysema: Secondary | ICD-10-CM

## 2014-10-07 DIAGNOSIS — R079 Chest pain, unspecified: Secondary | ICD-10-CM

## 2014-10-07 DIAGNOSIS — C782 Secondary malignant neoplasm of pleura: Secondary | ICD-10-CM

## 2014-10-07 DIAGNOSIS — C3431 Malignant neoplasm of lower lobe, right bronchus or lung: Secondary | ICD-10-CM

## 2014-10-07 DIAGNOSIS — C7951 Secondary malignant neoplasm of bone: Secondary | ICD-10-CM

## 2014-10-07 DIAGNOSIS — R932 Abnormal findings on diagnostic imaging of liver and biliary tract: Secondary | ICD-10-CM

## 2014-10-07 DIAGNOSIS — D630 Anemia in neoplastic disease: Secondary | ICD-10-CM

## 2014-10-07 MED ORDER — HYDROMORPHONE HCL 2 MG PO TABS: 2.0000 mg | ORAL_TABLET | Freq: Four times a day (QID) | ORAL | Status: AC | PRN

## 2014-10-07 NOTE — Progress Notes (Signed)
Todd Villanueva:([phone]   [phone]) [phone]  OFFICE PROGRESS NOTE  [name], MD [address] [place] [address]  DIAGNOSIS: Metastatic non-small cell lung cancer initially diagnosed as Stage IIB/IIIA non-small cell lung cancer, squamous cell carcinoma diagnosed in February of 2012.   PRIOR THERAPY:  1) Status post a course of concurrent chemoradiation with weekly carboplatin and paclitaxel. Last dose of chemotherapy was given on 03/21/2011.  2) status post right VATS, right thoracotomy, right middle and right lower lobectomies under the care of [name] on 06/10/2011.   CURRENT THERAPY: Systemic chemotherapy was carboplatin for AUC of 5 and paclitaxel 175 MG/M2 every 3 weeks with Neulasta support. First cycle on 10/14/2014.  INTERVAL HISTORY: Todd Villanueva 73 y.o. male returns to the clinic today for followup visit accompanied by his wife. The patient continues to complain of increasing shortness of breath especially with exertion as well as pain on the left side of the chest. 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 He denied having any significant cough or hemoptysis. The patient denied having any significant weight loss or night sweats. He has no nausea or vomiting. He had a PET scan as well as MRI of the brain performed recently and he is here for evaluation and discussion of his scan results and recommendation regarding treatment of his condition.   MEDICAL HISTORY: Past Medical History  Diagnosis Date  . Hypertension   . lung ca dx'd 02/2011    xrt/chemo comp 03/30/11, stage IIIA  . Hyperlipidemia   . 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 History of angina   . CAD (coronary artery disease)     ALLERGIES:  is allergic to hydrocodone-acetaminophen; tramadol; and zolpidem tartrate.  MEDICATIONS:  Current  Outpatient Prescriptions  Medication Sig Dispense Refill  . ALPRAZolam (XANAX) 0.25 MG tablet Take 0.25 mg by mouth 2 (two) times daily as needed for anxiety.     Marland Kitchen aspirin EC 81 MG EC tablet Take 1 tablet (81 mg total) by mouth daily. 30 tablet 1  . bevacizumab (AVASTIN) 1.25 mg/0.1 mL SOLN 1.25 mg by Intravitreal route every 30 (thirty) days.    . budesonide-formoterol (SYMBICORT) 160-4.5 MCG/ACT inhaler Inhale 2 puffs into the lungs 2 (two) times daily.    . citalopram (CELEXA) 20 MG tablet Take 20 mg by mouth daily.      . fluticasone (FLONASE) 50 MCG/ACT nasal spray Place 2 sprays into both nostrils daily. 16 g 2  . furosemide (LASIX) 40 MG tablet Take 1 tablet (40 mg total) by mouth 3 (three) times a week. 30 tablet 1  . isosorbide mononitrate (IMDUR) 30 MG 24 hr tablet Take 30 mg by mouth daily.    Marland Kitchen levalbuterol (XOPENEX) 0.63 MG/3ML nebulizer solution Take 0.63 mg by nebulization 2 (two) times daily.     Marland Kitchen omeprazole (PRILOSEC) 20 MG capsule Take 20 mg by mouth daily.      . simvastatin (ZOCOR) 40 MG tablet Take 40 mg by mouth every Monday, Wednesday, and Friday.      No current facility-administered medications for this visit.    SURGICAL HISTORY:  Past Surgical History  Procedure Laterality Date  . Right vats, rt thoracotomy, rt middle and rt lower  lobectomy  06/10/2011    Dr Arlyce Dice  . Knee arthroscopy    .  Tonsillectomy    . Coronary stent placement  JULY,AUGUST 2011  . Orif femoral neck fracture w/ dhs      REVIEW OF SYSTEMS:  Constitutional: positive for fatigue Eyes: negative Ears, nose, mouth, throat, and face: negative Respiratory: positive for pleurisy/chest pain Cardiovascular: negative Gastrointestinal: negative Genitourinary:negative Integument/breast: negative Hematologic/lymphatic: negative Musculoskeletal:negative Neurological: negative Behavioral/Psych: negative Endocrine: negative Allergic/Immunologic: negative   PHYSICAL EXAMINATION: General  appearance: alert, cooperative and no distress Head: Normocephalic, without obvious abnormality, atraumatic Neck: no adenopathy Lymph nodes: Cervical, supraclavicular, and axillary nodes normal. Resp: clear to auscultation bilaterally Back: symmetric, no curvature. ROM normal. No CVA tenderness. Cardio: regular rate and rhythm, S1, S2 normal, no murmur, click, rub or gallop GI: soft, non-tender; bowel sounds normal; no masses,  no organomegaly Extremities: extremities normal, atraumatic, no cyanosis or edema Neurologic: Alert and oriented X 3, normal strength and tone. Normal symmetric reflexes. Normal coordination and gait  ECOG PERFORMANCE STATUS: 1 - Symptomatic but completely ambulatory  There were no vitals taken for this visit.  LABORATORY DATA: Lab Results  Component Value Date   WBC 7.4 09/15/2014   HGB 11.6* 09/15/2014   HCT 35.1* 09/15/2014   MCV 97.9 09/15/2014   PLT 162 09/15/2014      Chemistry      Component Value Date/Time   NA 144 09/15/2014 0822   NA 139 06/10/2014 1611   NA 142 01/10/2012 1205   K 3.8 09/15/2014 0822   K 4.1 06/10/2014 1611   K 3.9 01/10/2012 1205   CL 105 06/10/2014 1611   CL 107 10/15/2012 1521   CL 102 01/10/2012 1205   CO2 24 09/15/2014 0822   CO2 26 06/10/2014 1611   CO2 26 01/10/2012 1205   BUN 31.6* 09/15/2014 0822   BUN 36* 06/10/2014 1611   BUN 27* 01/10/2012 1205   CREATININE 1.5* 09/15/2014 0822   CREATININE 1.8* 06/10/2014 1611   CREATININE 1.1 01/10/2012 1205      Component Value Date/Time   CALCIUM 9.6 09/15/2014 0822   CALCIUM 8.9 06/10/2014 1611   CALCIUM 8.8 01/10/2012 1205   ALKPHOS 129 09/15/2014 0822   ALKPHOS 156* 01/28/2014 0510   ALKPHOS 271* 01/10/2012 1205   AST 22 09/15/2014 0822   AST 25 01/28/2014 0510   AST 28 01/10/2012 1205   ALT 20 09/15/2014 0822   ALT 26 01/28/2014 0510   ALT 28 01/10/2012 1205   BILITOT 0.42 09/15/2014 0822   BILITOT 0.4 01/28/2014 0510   BILITOT 0.60 01/10/2012 1205        RADIOGRAPHIC STUDIES: Ct Chest Wo Contrast  09/17/2014   CLINICAL DATA:  Subsequent encounter, Malignant lung neoplasm C34.90 (ICD-10-CM). History right lung cancer, status post resection, chemotherapy and radiation therapy. Tightness across the mid to lower chest bilaterally.  EXAM: CT CHEST WITHOUT CONTRAST  TECHNIQUE: Multidetector CT imaging of the chest was performed following the standard protocol without IV contrast.  COMPARISON:  03/11/2014.  FINDINGS: High right paratracheal lymph node measures 10 mm, stable. AP window and left hilar adenopathy is new. Index AP window lymph node measures 1.3 cm (image 20) and index left hilar lymph node measures 1.9 cm (image 25). Additional left hilar adenopathy is difficult to discern without IV contrast. No axillary adenopathy. Atherosclerotic calcification of the arterial vasculature with extensive 3 vessel coronary artery calcification. Heart is enlarged. No pericardial effusion.  Postoperative changes are seen in the right hemi thorax with associated volume loss. Postradiation consolidation, bronchiectasis and volume loss in the lower right  hemi thorax appear stable. Small chronic right pleural fluid collection is also unchanged. New pleural nodularity throughout the left hemi thorax. Index lesion along the lower left anterior mediastinal border measures 1.1 x 1.7 cm (series 3, image 24). Left lower lobe nodule measures 5 x 8 mm, likely stable (image 39). Trace new left pleural fluid. Centrilobular and paraseptal emphysema. Airway is otherwise unremarkable.  Incidental imaging of the upper abdomen shows at least 3 new subtle low-attenuation lesions in the liver, measuring up to 1.8 cm in the dome (image 40). Visualized portions of the gallbladder, right adrenal gland, right kidney, spleen and stomach are otherwise grossly unremarkable. No upper abdominal adenopathy.  Multiple old right rib fractures and likely thoracotomy changes. Old left rib fractures.  Mid thoracic compression fracture is seen at approximately the T7 level, stable.  IMPRESSION: 1. Findings consistent with progression of metastatic lung cancer, as evidenced by new mediastinal/left hilar adenopathy, new left pleural nodularity and new hepatic metastases. 2. Postoperative and postradiation changes in the right hemi thorax with a small right fibrothorax, stable. 3. Three-vessel coronary artery calcification. 4. Trace new left pleural fluid.   Electronically Signed   By: Lorin Picket M.D.   On: 09/17/2014 09:49   Mr Jeri Cos JS Contrast  10/03/2014   CLINICAL DATA:  Right lower lobe lung cancer status post chemo radiation. Staging of lung cancer recurrence.  EXAM: MRI HEAD WITHOUT AND WITH CONTRAST  TECHNIQUE: Multiplanar, multiecho pulse sequences of the brain and surrounding structures were obtained without and with intravenous contrast.  CONTRAST:  2mL MULTIHANCE GADOBENATE DIMEGLUMINE 529 MG/ML IV SOLN  COMPARISON:  01/13/2011  FINDINGS: There is no evidence of acute infarct, intracranial hemorrhage, solid mass, midline shift, or extra-axial fluid collection. There is mild generalized cerebral atrophy, similar to the prior study. Patchy periventricular white matter T2 hyperintensities have minimally progressed from the prior study and are nonspecific but compatible with mild chronic small vessel ischemic disease.  Postcontrast images are mildly degraded by motion artifact. No enhancing lesions are identified. 1.4 x 0.8 cm nonenhancing cystic focus in the anterior right temporal lobe with small amount of parenchymal T2 hyperintensity posterior to it is unchanged and of uncertain etiology but favored to be benign.  Prior bilateral cataract extraction is noted. Paranasal sinuses and mastoid air cells are clear. Major intracranial vascular flow voids are preserved.  IMPRESSION: 1. No evidence of intracranial metastases. 2. Unchanged cystic focus in the anterior right temporal lobe, of uncertain  etiology but favored to be benign. 3. Mild chronic small vessel ischemic disease and cerebral atrophy.   Electronically Signed   By: Logan Bores   On: 10/03/2014 17:12   Nm Pet Image Restag (ps) Skull Base To Thigh  09/30/2014   CLINICAL DATA:  Subsequent treatment strategy for lung carcinoma.  EXAM: NUCLEAR MEDICINE PET SKULL BASE TO THIGH  TECHNIQUE: 9.5 mCi F-18 FDG was injected intravenously. Full-ring PET imaging was performed from the skull base to thigh after the radiotracer. CT data was obtained and used for attenuation correction and anatomic localization.  FASTING BLOOD GLUCOSE:  Value: 83 mg/dl  COMPARISON:  PET-CT 05/10/2011, CT 09/07/2014, 02/02/2014  FINDINGS: NECK  No hypermetabolic lymph nodes in the neck.  CHEST  Left suprahilar mass measuring 3.8 x 3.2 cm (image 77, series 4) with intense metabolic activity (SUV max 27). Multiple round hypermetabolic prevascular and retrosternal lymph nodes consistent with mediastinal nodal metastasis. Multiple pleural-based metastasis in the left hemi thorax including 12 mm lesion on  image 93 with SUV max 21. Larger lesion at the left lung base measuring 3.9 cm with SUV max 20 (image 109).  ABDOMEN/PELVIS  Multiple sites of hepatic metastasis. Lesion superior right hepatic lobe measures 4 cm on image 99 with intense metabolic activity SUV max = 38 . Approximately 8-10 metastasis within the liver.  Adrenal glands are normal. Several hypermetabolic periportal lymph nodes present. Hypermetabolic periaortic or iliac lymph nodes.  SKELETON  Multiple sites of new metabolic activity within the spine skeleton. Lesions include the right iliac wing on image 144 and the right sacrum on image 153. There is a subtle lucent lesion within the sacrum at this level. The iliac lesions difficult to see on CT. There is a lesion within the dens of the C2 vertebral body.  IMPRESSION: 1. Lung cancer recurrence with left suprahilar mass and multiple pleural metastasis in the left  hemi thorax. 2. Multiple hypermetabolic mediastinal nodal metastasis. 3. Multifocal hypermetabolic liver metastasis. 4. Mild periportal nodal metastasis. 5. 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   Electronically Signed   By: Suzy Bouchard M.D.   On: 09/30/2014 10:27    ASSESSMENT AND PLAN: This is a very pleasant 73 years old white male with metastatic non-small cell lung cancer initially diagnosed as stage IIB/IIIa non-small cell lung cancer status post concurrent chemoradiation followed by right middle and right lower lobectomies and has been observation since July of 2002. The recent PET scan showed evidence for widely metastatic disease involving the left suprahilar mass as well as multiple pleural metastasis in the left hemothorax in addition to multiple mediastinal lymphadenopathy and liver lesion as well as skeletal metastasis.  The MRI of the brain showed no evidence for metastatic disease. I had a lengthy discussion with the patient and his wife about his current scan results and showed them the images. I recommended for the patient to have ultrasound-guided core biopsy of one of the liver lesion to confirm the tissue diagnosis. I also recommended for the patient treatment with systemic chemotherapy with carboplatin for AUC of 5 and paclitaxel 175 MG/M2 every 3 weeks with Neulasta support. I discussed with the patient adverse effect of this treatment including but not limited to alopecia, myelosuppression, nausea and vomiting, peripheral neuropathy, liver or renal dysfunction. He is expected to start the first cycle of this treatment next week. I will also arrange for the patient to have Xgeva injection every 4 weeks for the metastatic bone disease. He was advised to take calcium and vitamin D supplements on daily basis. He has denture and does not need any dental evaluation. The patient would come back for follow-up visit in 2 weeks for  reevaluation and management of any adverse effect of his treatment. For pain management I started him on Dilaudid 2 mg by mouth every 6 hours as needed for pain. He was advised to call immediately if he has any concerning symptoms in the interval. The patient voices understanding of current disease status and treatment options and is in agreement with the current care plan.  All questions were answered. The patient knows to call the clinic with any problems, questions or concerns. We can certainly see the patient much sooner if necessary.  Disclaimer: This note was dictated with voice recognition software. Similar sounding words can inadvertently be transcribed and may not be corrected upon review.

## 2014-10-07 NOTE — Addendum Note (Signed)
Addended by: [name] on: 10/07/2014 02:22 PM   Modules accepted: Orders

## 2014-10-08 ENCOUNTER — Telehealth: Payer: Self-pay | Admitting: *Deleted

## 2014-10-08 ENCOUNTER — Telehealth: Payer: Self-pay | Admitting: Internal Medicine

## 2014-10-08 ENCOUNTER — Encounter: Payer: Self-pay | Admitting: Physician Assistant

## 2014-10-08 NOTE — Telephone Encounter (Signed)
pt called to sched appts on wed. done...emailed MW to add tx....pt ok and aware he will ck my chart.

## 2014-10-08 NOTE — Telephone Encounter (Signed)
Per staff message and POF I have scheduled appts. Advised scheduler of appts. JMW  

## 2014-10-08 NOTE — Telephone Encounter (Signed)
returned pt's call re mychart appt questions. lmonvm for pt to call me back.

## 2014-10-09 ENCOUNTER — Encounter: Payer: Self-pay | Admitting: Internal Medicine

## 2014-10-10 ENCOUNTER — Other Ambulatory Visit: Payer: Self-pay | Admitting: Radiology

## 2014-10-13 ENCOUNTER — Ambulatory Visit (HOSPITAL_COMMUNITY)
Admission: RE | Admit: 2014-10-13 | Discharge: 2014-10-13 | Disposition: A | Discharge status: Home / Self Care | Payer: 59 | Source: Ambulatory Visit | Attending: Internal Medicine | Admitting: Internal Medicine

## 2014-10-13 ENCOUNTER — Other Ambulatory Visit: Payer: Self-pay | Admitting: Internal Medicine

## 2014-10-13 ENCOUNTER — Encounter (HOSPITAL_COMMUNITY): Payer: Self-pay

## 2014-10-13 VITALS — BP 147/77 | HR 102 | Resp 20

## 2014-10-13 DIAGNOSIS — J438 Other emphysema: Secondary | ICD-10-CM

## 2014-10-13 DIAGNOSIS — C3490 Malignant neoplasm of unspecified part of unspecified bronchus or lung: Secondary | ICD-10-CM | POA: Insufficient documentation

## 2014-10-13 DIAGNOSIS — D630 Anemia in neoplastic disease: Secondary | ICD-10-CM

## 2014-10-13 DIAGNOSIS — C3431 Malignant neoplasm of lower lobe, right bronchus or lung: Secondary | ICD-10-CM

## 2014-10-13 DIAGNOSIS — R079 Chest pain, unspecified: Secondary | ICD-10-CM

## 2014-10-13 DIAGNOSIS — C787 Secondary malignant neoplasm of liver and intrahepatic bile duct: Secondary | ICD-10-CM | POA: Insufficient documentation

## 2014-10-13 LAB — CBC
HCT: 34.5 % — ABNORMAL LOW (ref 39.0–52.0)
HEMOGLOBIN: 11.4 g/dL — AB (ref 13.0–17.0)
MCH: 32.1 pg (ref 26.0–34.0)
MCHC: 33 g/dL (ref 30.0–36.0)
MCV: 97.2 fL (ref 78.0–100.0)
Platelets: 225 10*3/uL (ref 150–400)
RBC: 3.55 MIL/uL — ABNORMAL LOW (ref 4.22–5.81)
RDW: 13.4 % (ref 11.5–15.5)
WBC: 9.8 10*3/uL (ref 4.0–10.5)

## 2014-10-13 LAB — APTT: aPTT: 30 seconds (ref 24–37)

## 2014-10-13 LAB — PROTIME-INR
INR: 0.99 (ref 0.00–1.49)
PROTHROMBIN TIME: 13.2 s (ref 11.6–15.2)

## 2014-10-13 MED ORDER — LIDOCAINE HCL (PF) 1 % IJ SOLN
INTRAMUSCULAR | Status: AC
  Filled 2014-10-13: qty 10

## 2014-10-13 MED ORDER — SODIUM CHLORIDE 0.9 % IV SOLN: Freq: Once | INTRAVENOUS | Status: DC

## 2014-10-13 MED ORDER — GELATIN ABSORBABLE 12-7 MM EX MISC
CUTANEOUS | Status: AC
  Filled 2014-10-13: qty 1

## 2014-10-13 MED ORDER — MIDAZOLAM HCL 2 MG/2ML IJ SOLN
INTRAMUSCULAR | Status: AC | PRN
  Administered 2014-10-13 (×2): 1 mg via INTRAVENOUS

## 2014-10-13 MED ORDER — FENTANYL CITRATE 0.05 MG/ML IJ SOLN
INTRAMUSCULAR | Status: AC
  Filled 2014-10-13: qty 2

## 2014-10-13 MED ORDER — LIDOCAINE HCL (PF) 1 % IJ SOLN
INTRAMUSCULAR | Status: AC
  Filled 2014-10-13: qty 30

## 2014-10-13 MED ORDER — SODIUM CHLORIDE 0.9 % IV SOLN: INTRAVENOUS | Status: AC | PRN

## 2014-10-13 MED ORDER — MIDAZOLAM HCL 2 MG/2ML IJ SOLN
INTRAMUSCULAR | Status: AC
  Filled 2014-10-13: qty 2

## 2014-10-13 MED ORDER — FENTANYL CITRATE 0.05 MG/ML IJ SOLN
INTRAMUSCULAR | Status: AC | PRN
  Administered 2014-10-13 (×2): 50 ug via INTRAVENOUS

## 2014-10-13 NOTE — Discharge Instructions (Signed)
Liver Biopsy, Care After °These instructions give you information on caring for yourself after your procedure. Your doctor may also give you more specific instructions. Call your doctor if you have any problems or questions after your procedure. °HOME CARE °· Rest at home for 1-2 days or as told by your doctor. °· Have someone stay with you for at least 24 hours. °· Do not do these things in the first 24 hours: °¨ Drive. °¨ Use machinery. °¨ Take care of other people. °¨ Sign legal documents. °¨ Take a bath or shower. °· There are many different ways to close and cover a cut (incision). For example, a cut can be closed with stitches, skin glue, or adhesive strips. Follow your doctor's instructions on: °¨ Taking care of your cut. °¨ Changing and removing your bandage (dressing). °¨ Removing whatever was used to close your cut. °· Do not drink alcohol in the first week. °· Do not lift more than 5 pounds or play contact sports for the first 2 weeks. °· Take medicines only as told by your doctor. For 1 week, do not take medicine that has aspirin in it or medicines like ibuprofen. °· Get your test results. °GET HELP IF: °· A cut bleeds and leaves more than just a small spot of blood. °· A cut is red, puffs up (swells), or hurts more than before. °· Fluid or something else comes from a cut. °· A cut smells bad. °· You have a fever or chills. °GET HELP RIGHT AWAY IF: °· You have swelling, bloating, or pain in your belly (abdomen). °· You get dizzy or faint. °· You have a rash. °· You feel sick to your stomach (nauseous) or throw up (vomit). °· You have trouble breathing, feel short of breath, or feel faint. °· Your chest hurts. °· You have problems talking or seeing. °· You have trouble balancing or moving your arms or legs. °Document Released: 08/30/2008 Document Revised: 04/07/2014 Document Reviewed: 01/17/2014 °ExitCare® Patient Information ©2015 [employer]. This information is not intended to replace advice given to  you by your health care provider. Make sure you discuss any questions you have with your health care provider. ° °

## 2014-10-13 NOTE — H&P (Signed)
Chief Complaint: Hx Lung Ca 2012 Routine follow up shows multiple new lesions +PET  Referring Physician(s): Mohamed,Mohamed  History of Present Illness: Todd Villanueva is a 73 y.o. male  Hx lung ca 2012 Routine follow up CT scan revealed liver lesions +PET 09/2014-  Shows liver lesions; mediastinal nodes; multiple bony sites Now scheduled for liver lesion biopsy  Past Medical History  Diagnosis Date  . Hypertension   . lung ca dx'd 02/2011    xrt/chemo comp 03/30/11, stage IIIA  . Hyperlipidemia   . 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 History of angina   . CAD (coronary artery disease)     Past Surgical History  Procedure Laterality Date  . Right vats, rt thoracotomy, rt middle and rt lower  lobectomy  06/10/2011    Dr Arlyce Dice  . Knee arthroscopy    . Tonsillectomy    . Coronary stent placement  JULY,AUGUST 2011  . Orif femoral neck fracture w/ dhs      Allergies: Hydrocodone-acetaminophen; Tramadol; and Zolpidem tartrate  Medications: Prior to Admission medications   Medication Sig Start Date End Date Taking? Authorizing Provider  ALPRAZolam (XANAX) 0.25 MG tablet Take 0.25 mg by mouth 2 (two) times daily as needed for anxiety.    Yes Historical Provider, MD  aspirin EC 81 MG EC tablet Take 1 tablet (81 mg total) by mouth daily. 02/05/14  Yes Josetta Huddle, MD  bevacizumab (AVASTIN) 1.25 mg/0.1 mL SOLN 1.25 mg by Intravitreal route every 30 (thirty) days.   Yes Hayden Pedro, MD  budesonide-formoterol [facility]) 160-4.5 MCG/ACT inhaler Inhale 2 puffs into the lungs 2 (two) times daily.   Yes Historical Provider, MD  citalopram (CELEXA) 40 MG tablet Take 20 mg by mouth daily.  09/10/14  Yes Historical Provider, MD  fluticasone (FLONASE) 50 MCG/ACT nasal spray Place 2 sprays into both nostrils daily. 09/18/14  Yes [name], MD  furosemide (LASIX) 20 MG tablet 20 mg 3 (three) times a week.  Molli Knock Wed Fri 09/29/14  Yes Historical Provider, MD  HYDROmorphone (DILAUDID) 2 MG tablet Take 1 tablet (2 mg total) by mouth every 6 (six) hours as needed for severe pain. 10/07/14  Yes [name], MD  isosorbide mononitrate (IMDUR) 30 MG 24 hr tablet Take 30 mg by mouth daily.   Yes Historical Provider, MD  levalbuterol Penne Lash) 0.63 MG/3ML nebulizer solution Take 0.63 mg by nebulization 2 (two) times daily.    Yes Historical Provider, MD  naproxen (NAPROSYN) 500 MG tablet Take 500 mg by mouth 2 (two) times daily with a meal.  07/28/14  Yes Historical Provider, MD  omeprazole (PRILOSEC) 20 MG capsule Take 40 mg by mouth daily.    Yes Historical Provider, MD  predniSONE (DELTASONE) 5 MG tablet Take 5 mg by mouth daily with breakfast.  08/12/14  Yes Historical Provider, MD  simvastatin (ZOCOR) 40 MG tablet Take 40 mg by mouth every Monday, Wednesday, and Friday.    Yes Historical Provider, MD    Family History  Problem Relation Age of Onset  . Cancer Father     multiple myeloma  . COPD Sister     emphysema- smoked  . Cancer Brother     multiple myeloma    History   Social History  . Marital Status: Married    Spouse Name: N/A    Number of Children: N/A  . Years of  Education: N/A   Occupational History  . mail room at [employer] History Main Topics  . Smoking status: Former Smoker -- 0.50 packs/day for 53 years    Types: Cigarettes    Quit date: 10/05/2013  . Smokeless tobacco: Never Used  . Alcohol Use: Yes     Comment: occasionally  . Drug Use: No  . Sexual Activity: None   Other Topics Concern  . None   Social History Narrative     Review of Systems: A 12 point ROS discussed and pertinent positives are indicated in the HPI above.  All other systems are negative.  Review of Systems  Constitutional: Negative for fever and activity change.  Respiratory: Negative for cough and shortness of breath.   Cardiovascular: Negative for chest pain.    Gastrointestinal: Negative for abdominal pain.  Genitourinary: Negative for difficulty urinating.  Musculoskeletal: Negative for back pain and neck pain.  Neurological: Negative for dizziness, weakness and headaches.  Psychiatric/Behavioral: Negative for behavioral problems and confusion.    Vital Signs: There were no vitals taken for this visit.  Physical Exam  Constitutional: He is oriented to person, place, and time. He appears well-nourished.  Cardiovascular: Normal rate, regular rhythm and normal heart sounds.   No murmur heard. Pulmonary/Chest: Effort normal and breath sounds normal. He has no wheezes.  Abdominal: Soft. Bowel sounds are normal. There is no tenderness.  Musculoskeletal: Normal range of motion.  Neurological: He is alert and oriented to person, place, and time.  Skin: Skin is warm and dry.  Psychiatric: He has a normal mood and affect. His behavior is normal. Judgment and thought content normal.  Nursing note and vitals reviewed.   Imaging: Ct Chest Wo Contrast  09/17/2014   CLINICAL DATA:  Subsequent encounter, Malignant lung neoplasm C34.90 (ICD-10-CM). History right lung cancer, status post resection, chemotherapy and radiation therapy. Tightness across the mid to lower chest bilaterally.  EXAM: CT CHEST WITHOUT CONTRAST  TECHNIQUE: Multidetector CT imaging of the chest was performed following the standard protocol without IV contrast.  COMPARISON:  03/11/2014.  FINDINGS: High right paratracheal lymph node measures 10 mm, stable. AP window and left hilar adenopathy is new. Index AP window lymph node measures 1.3 cm (image 20) and index left hilar lymph node measures 1.9 cm (image 25). Additional left hilar adenopathy is difficult to discern without IV contrast. No axillary adenopathy. Atherosclerotic calcification of the arterial vasculature with extensive 3 vessel coronary artery calcification. Heart is enlarged. No pericardial effusion.  Postoperative changes are  seen in the right hemi thorax with associated volume loss. Postradiation consolidation, bronchiectasis and volume loss in the lower right hemi thorax appear stable. Small chronic right pleural fluid collection is also unchanged. New pleural nodularity throughout the left hemi thorax. Index lesion along the lower left anterior mediastinal border measures 1.1 x 1.7 cm (series 3, image 24). Left lower lobe nodule measures 5 x 8 mm, likely stable (image 39). Trace new left pleural fluid. Centrilobular and paraseptal emphysema. Airway is otherwise unremarkable.  Incidental imaging of the upper abdomen shows at least 3 new subtle low-attenuation lesions in the liver, measuring up to 1.8 cm in the dome (image 40). Visualized portions of the gallbladder, right adrenal gland, right kidney, spleen and stomach are otherwise grossly unremarkable. No upper abdominal adenopathy.  Multiple old right rib fractures and likely thoracotomy changes. Old left rib fractures. Mid thoracic compression fracture is seen at approximately the T7 level, stable.  IMPRESSION: 1.  Findings consistent with progression of metastatic lung cancer, as evidenced by new mediastinal/left hilar adenopathy, new left pleural nodularity and new hepatic metastases. 2. Postoperative and postradiation changes in the right hemi thorax with a small right fibrothorax, stable. 3. Three-vessel coronary artery calcification. 4. Trace new left pleural fluid.   Electronically Signed   By: Lorin Picket M.D.   On: 09/17/2014 09:49   Mr Jeri Cos TG Contrast  10/03/2014   CLINICAL DATA:  Right lower lobe lung cancer status post chemo radiation. Staging of lung cancer recurrence.  EXAM: MRI HEAD WITHOUT AND WITH CONTRAST  TECHNIQUE: Multiplanar, multiecho pulse sequences of the brain and surrounding structures were obtained without and with intravenous contrast.  CONTRAST:  78m MULTIHANCE GADOBENATE DIMEGLUMINE 529 MG/ML IV SOLN  COMPARISON:  01/13/2011  FINDINGS:  There is no evidence of acute infarct, intracranial hemorrhage, solid mass, midline shift, or extra-axial fluid collection. There is mild generalized cerebral atrophy, similar to the prior study. Patchy periventricular white matter T2 hyperintensities have minimally progressed from the prior study and are nonspecific but compatible with mild chronic small vessel ischemic disease.  Postcontrast images are mildly degraded by motion artifact. No enhancing lesions are identified. 1.4 x 0.8 cm nonenhancing cystic focus in the anterior right temporal lobe with small amount of parenchymal T2 hyperintensity posterior to it is unchanged and of uncertain etiology but favored to be benign.  Prior bilateral cataract extraction is noted. Paranasal sinuses and mastoid air cells are clear. Major intracranial vascular flow voids are preserved.  IMPRESSION: 1. No evidence of intracranial metastases. 2. Unchanged cystic focus in the anterior right temporal lobe, of uncertain etiology but favored to be benign. 3. Mild chronic small vessel ischemic disease and cerebral atrophy.   Electronically Signed   By: ALogan Bores  On: 10/03/2014 17:12   Nm Pet Image Restag (ps) Skull Base To Thigh  09/30/2014   CLINICAL DATA:  Subsequent treatment strategy for lung carcinoma.  EXAM: NUCLEAR MEDICINE PET SKULL BASE TO THIGH  TECHNIQUE: 9.5 mCi F-18 FDG was injected intravenously. Full-ring PET imaging was performed from the skull base to thigh after the radiotracer. CT data was obtained and used for attenuation correction and anatomic localization.  FASTING BLOOD GLUCOSE:  Value: 83 mg/dl  COMPARISON:  PET-CT 05/10/2011, CT 09/07/2014, 02/02/2014  FINDINGS: NECK  No hypermetabolic lymph nodes in the neck.  CHEST  Left suprahilar mass measuring 3.8 x 3.2 cm (image 77, series 4) with intense metabolic activity (SUV max 27). Multiple round hypermetabolic prevascular and retrosternal lymph nodes consistent with mediastinal nodal metastasis.  Multiple pleural-based metastasis in the left hemi thorax including 12 mm lesion on image 93 with SUV max 21. Larger lesion at the left lung base measuring 3.9 cm with SUV max 20 (image 109).  ABDOMEN/PELVIS  Multiple sites of hepatic metastasis. Lesion superior right hepatic lobe measures 4 cm on image 99 with intense metabolic activity SUV max = 38 . Approximately 8-10 metastasis within the liver.  Adrenal glands are normal. Several hypermetabolic periportal lymph nodes present. Hypermetabolic periaortic or iliac lymph nodes.  SKELETON  Multiple sites of new metabolic activity within the spine skeleton. Lesions include the right iliac wing on image 144 and the right sacrum on image 153. There is a subtle lucent lesion within the sacrum at this level. The iliac lesions difficult to see on CT. There is a lesion within the dens of the C2 vertebral body.  IMPRESSION: 1. Lung cancer recurrence with left suprahilar  mass and multiple pleural metastasis in the left hemi thorax. 2. Multiple hypermetabolic mediastinal nodal metastasis. 3. Multifocal hypermetabolic liver metastasis. 4. Mild periportal nodal metastasis. 5. 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   Electronically Signed   By: Suzy Bouchard M.D.   On: 09/30/2014 10:27    Labs:  CBC:  Recent Labs  05/07/14 1608 06/10/14 1611 09/15/14 0822 10/13/14 1226  WBC 8.4 7.7 7.4 9.8  HGB 10.6* 11.2* 11.6* 11.4*  HCT 31.9* 32.7* 35.1* 34.5*  PLT 209.0 186.0 162 225    COAGS:  Recent Labs  01/27/14 1740 05/12/14 1540 06/10/14 1611 10/13/14 1226  INR 0.96 1.1* 1.0 0.99  APTT  --   --  29.6* 30    BMP:  Recent Labs  02/02/14 0316 02/03/14 0500 02/04/14 0530 02/05/14 0545 03/10/14 0811 05/12/14 1540 06/10/14 1611 09/15/14 0822  NA 138 134* 137 137 146* 138 139 144  K 4.8 4.8 5.0 5.0 3.7 4.1 4.1 3.8  CL 94* 94* 97 97  --  101 105  --   CO2 23 27 26 25 25 27 26 24   GLUCOSE 133*  96 77 71 73 94 135* 105  BUN 74* 76* 85* 89* 31.4* 27* 36* 31.6*  CALCIUM 8.6 9.1 9.1 9.4 9.1 9.2 8.9 9.6  CREATININE 2.62* 2.35* 2.53* 2.66* 1.5* 1.6* 1.8* 1.5*  GFRNONAA 23* 26* 24* 23*  --   --   --   --   GFRAA 27* 31* 28* 26*  --   --   --   --     LIVER FUNCTION TESTS:  Recent Labs  12/16/13 1100 01/28/14 0510 03/10/14 0811 09/15/14 0822  BILITOT 0.5 0.4 0.59 0.42  AST 41* 25 27 22   ALT 77* 26 28 20   ALKPHOS 222* 156* 135 129  PROT 7.6 6.2 6.7 6.9  ALBUMIN 3.5 2.3* 3.4* 3.5    TUMOR MARKERS: No results for input(s): AFPTM, CEA, CA199, CHROMGRNA in the last 8760 hours.  Assessment and Plan:  Hx lung ca New lesions on routine follow up CT scan Work up revealed +PET lung nodules; mediastinal nodes; liver mets; bony lesions Now scheduled for liver lesion biopsy Pt aware of procedure benefits and risks and agreeable to proceed Consent signed andin chart   Thank you for this interesting consult.  I greatly enjoyed meeting Todd Villanueva and look forward to participating in their care.    I spent a total of 20 minutes face to face in clinical consultation, greater than 50% of which was counseling/coordinating care for liver lesion bx  Signed: Aino Heckert A 10/13/2014, 2:27 PM

## 2014-10-13 NOTE — Procedures (Signed)
Procedure:  CT guided liver biopsy Findings:  No liver lesions identified by ultrasound of liver.  CT shows peripheral right lobe lesion.  18 G core biopsy performed x 3 via 17 G needle. No immediate complications.

## 2014-10-14 ENCOUNTER — Other Ambulatory Visit: Payer: Self-pay | Admitting: Medical Oncology

## 2014-10-14 ENCOUNTER — Other Ambulatory Visit: Payer: 59

## 2014-10-14 ENCOUNTER — Ambulatory Visit (HOSPITAL_COMMUNITY): Payer: 59

## 2014-10-14 DIAGNOSIS — C3431 Malignant neoplasm of lower lobe, right bronchus or lung: Secondary | ICD-10-CM

## 2014-10-15 ENCOUNTER — Ambulatory Visit (HOSPITAL_COMMUNITY)
Admission: RE | Admit: 2014-10-15 | Discharge: 2014-10-15 | Disposition: A | Discharge status: Home / Self Care | Payer: 59 | Source: Ambulatory Visit | Attending: Radiology | Admitting: Radiology

## 2014-10-15 ENCOUNTER — Other Ambulatory Visit: Payer: 59

## 2014-10-15 ENCOUNTER — Other Ambulatory Visit: Payer: Self-pay | Admitting: Radiology

## 2014-10-15 ENCOUNTER — Ambulatory Visit: Payer: 59

## 2014-10-15 ENCOUNTER — Telehealth: Payer: Self-pay | Admitting: Internal Medicine

## 2014-10-15 DIAGNOSIS — R100 Acute abdomen: Secondary | ICD-10-CM | POA: Insufficient documentation

## 2014-10-15 DIAGNOSIS — R0602 Shortness of breath: Secondary | ICD-10-CM | POA: Insufficient documentation

## 2014-10-15 DIAGNOSIS — R109 Unspecified abdominal pain: Secondary | ICD-10-CM

## 2014-10-15 NOTE — Telephone Encounter (Signed)
returned pt call and r/s appt to 11.12 and 11.13 oked by MD....pt ok and aware

## 2014-10-16 ENCOUNTER — Ambulatory Visit: Payer: 59

## 2014-10-16 ENCOUNTER — Ambulatory Visit (HOSPITAL_BASED_OUTPATIENT_CLINIC_OR_DEPARTMENT_OTHER): Payer: 59

## 2014-10-16 ENCOUNTER — Other Ambulatory Visit: Payer: Self-pay | Admitting: Internal Medicine

## 2014-10-16 ENCOUNTER — Other Ambulatory Visit (HOSPITAL_BASED_OUTPATIENT_CLINIC_OR_DEPARTMENT_OTHER): Payer: 59

## 2014-10-16 DIAGNOSIS — Z5111 Encounter for antineoplastic chemotherapy: Secondary | ICD-10-CM

## 2014-10-16 DIAGNOSIS — C7951 Secondary malignant neoplasm of bone: Secondary | ICD-10-CM

## 2014-10-16 DIAGNOSIS — C3431 Malignant neoplasm of lower lobe, right bronchus or lung: Secondary | ICD-10-CM

## 2014-10-16 DIAGNOSIS — C787 Secondary malignant neoplasm of liver and intrahepatic bile duct: Secondary | ICD-10-CM

## 2014-10-16 DIAGNOSIS — C782 Secondary malignant neoplasm of pleura: Secondary | ICD-10-CM

## 2014-10-16 LAB — COMPREHENSIVE METABOLIC PANEL (CC13)
ALT: 30 U/L (ref 0–55)
ANION GAP: 9 meq/L (ref 3–11)
AST: 34 U/L (ref 5–34)
Albumin: 3.3 g/dL — ABNORMAL LOW (ref 3.5–5.0)
Alkaline Phosphatase: 159 U/L — ABNORMAL HIGH (ref 40–150)
BILIRUBIN TOTAL: 0.78 mg/dL (ref 0.20–1.20)
BUN: 32 mg/dL — ABNORMAL HIGH (ref 7.0–26.0)
CHLORIDE: 104 meq/L (ref 98–109)
CO2: 24 meq/L (ref 22–29)
Calcium: 9.4 mg/dL (ref 8.4–10.4)
Creatinine: 1.7 mg/dL — ABNORMAL HIGH (ref 0.7–1.3)
Glucose: 102 mg/dl (ref 70–140)
Potassium: 4.2 mEq/L (ref 3.5–5.1)
SODIUM: 138 meq/L (ref 136–145)
TOTAL PROTEIN: 6.8 g/dL (ref 6.4–8.3)

## 2014-10-16 LAB — CBC WITH DIFFERENTIAL/PLATELET
BASO%: 0.1 % (ref 0.0–2.0)
BASOS ABS: 0 10*3/uL (ref 0.0–0.1)
EOS%: 0.5 % (ref 0.0–7.0)
Eosinophils Absolute: 0 10*3/uL (ref 0.0–0.5)
HCT: 31.7 % — ABNORMAL LOW (ref 38.4–49.9)
HGB: 10.8 g/dL — ABNORMAL LOW (ref 13.0–17.1)
LYMPH#: 0.8 10*3/uL — AB (ref 0.9–3.3)
LYMPH%: 9.2 % — AB (ref 14.0–49.0)
MCH: 32.5 pg (ref 27.2–33.4)
MCHC: 34.1 g/dL (ref 32.0–36.0)
MCV: 95.5 fL (ref 79.3–98.0)
MONO#: 0.8 10*3/uL (ref 0.1–0.9)
MONO%: 9.2 % (ref 0.0–14.0)
NEUT#: 7 10*3/uL — ABNORMAL HIGH (ref 1.5–6.5)
NEUT%: 81 % — AB (ref 39.0–75.0)
Platelets: 181 10*3/uL (ref 140–400)
RBC: 3.32 10*6/uL — ABNORMAL LOW (ref 4.20–5.82)
RDW: 13.2 % (ref 11.0–14.6)
WBC: 8.7 10*3/uL (ref 4.0–10.3)

## 2014-10-16 MED ORDER — DEXAMETHASONE SODIUM PHOSPHATE 20 MG/5ML IJ SOLN
INTRAMUSCULAR | Status: AC
  Filled 2014-10-16: qty 5

## 2014-10-16 MED ORDER — FAMOTIDINE IN NACL 20-0.9 MG/50ML-% IV SOLN
INTRAVENOUS | Status: AC
  Filled 2014-10-16: qty 50

## 2014-10-16 MED ORDER — ONDANSETRON 16 MG/50ML IVPB (CHCC)
INTRAVENOUS | Status: AC
  Filled 2014-10-16: qty 16

## 2014-10-16 MED ORDER — DIPHENHYDRAMINE HCL 50 MG/ML IJ SOLN
50.0000 mg | Freq: Once | INTRAMUSCULAR | Status: AC
  Administered 2014-10-16: 50 mg via INTRAVENOUS

## 2014-10-16 MED ORDER — PROCHLORPERAZINE MALEATE 10 MG PO TABS: 10.0000 mg | ORAL_TABLET | Freq: Four times a day (QID) | ORAL | Status: AC | PRN

## 2014-10-16 MED ORDER — SODIUM CHLORIDE 0.9 % IV SOLN
Freq: Once | INTRAVENOUS | Status: AC
  Administered 2014-10-16: 13:00:00 via INTRAVENOUS

## 2014-10-16 MED ORDER — DENOSUMAB 120 MG/1.7ML ~~LOC~~ SOLN
120.0000 mg | Freq: Once | SUBCUTANEOUS | Status: AC
  Administered 2014-10-16: 120 mg via SUBCUTANEOUS
  Filled 2014-10-16: qty 1.7

## 2014-10-16 MED ORDER — SODIUM CHLORIDE 0.9 % IV SOLN
370.5000 mg | Freq: Once | INTRAVENOUS | Status: AC
  Administered 2014-10-16: 370 mg via INTRAVENOUS
  Filled 2014-10-16: qty 37

## 2014-10-16 MED ORDER — DEXAMETHASONE SODIUM PHOSPHATE 20 MG/5ML IJ SOLN
20.0000 mg | Freq: Once | INTRAMUSCULAR | Status: AC
  Administered 2014-10-16: 20 mg via INTRAVENOUS

## 2014-10-16 MED ORDER — PACLITAXEL CHEMO INJECTION 300 MG/50ML
175.0000 mg/m2 | Freq: Once | INTRAVENOUS | Status: AC
  Administered 2014-10-16: 348 mg via INTRAVENOUS
  Filled 2014-10-16: qty 58

## 2014-10-16 MED ORDER — ONDANSETRON 16 MG/50ML IVPB (CHCC)
16.0000 mg | Freq: Once | INTRAVENOUS | Status: AC
  Administered 2014-10-16: 16 mg via INTRAVENOUS

## 2014-10-16 MED ORDER — FAMOTIDINE IN NACL 20-0.9 MG/50ML-% IV SOLN
20.0000 mg | Freq: Once | INTRAVENOUS | Status: AC
  Administered 2014-10-16: 20 mg via INTRAVENOUS

## 2014-10-16 NOTE — Patient Instructions (Addendum)
[facility] Discharge Instructions for Patients Receiving Chemotherapy  Today you received the following chemotherapy agents:  Taxol and Carboplatin  To help prevent nausea and vomiting after your treatment, we encourage you to take your nausea medication as ordered per MD.    If you develop nausea and vomiting that is not controlled by your nausea medication, call the clinic.   BELOW ARE SYMPTOMS THAT SHOULD BE REPORTED IMMEDIATELY:  *FEVER GREATER THAN 100.5 F  *CHILLS WITH OR WITHOUT FEVER  NAUSEA AND VOMITING THAT IS NOT CONTROLLED WITH YOUR NAUSEA MEDICATION  *UNUSUAL SHORTNESS OF BREATH  *UNUSUAL BRUISING OR BLEEDING  TENDERNESS IN MOUTH AND THROAT WITH OR WITHOUT PRESENCE OF ULCERS  *URINARY PROBLEMS  *BOWEL PROBLEMS  UNUSUAL RASH Items with * indicate a potential emergency and should be followed up as soon as possible.  Feel free to call the clinic you have any questions or concerns. The clinic phone number is [phone].   Denosumab injection What is this medicine? DENOSUMAB (den oh sue mab) slows bone breakdown. Prolia is used to treat osteoporosis in women after menopause and in men. Delton See is used to prevent bone fractures and other bone problems caused by cancer bone metastases. Delton See is also used to treat giant cell tumor of the bone. This medicine may be used for other purposes; ask your health care provider or pharmacist if you have questions. COMMON BRAND NAME(S): Prolia, XGEVA What should I tell my health care provider before I take this medicine? They need to know if you have any of these conditions: -dental disease -eczema -infection or history of infections -kidney disease or on dialysis -low blood calcium or vitamin D -malabsorption syndrome -scheduled to have surgery or tooth extraction -taking medicine that contains denosumab -thyroid or parathyroid disease -an unusual reaction to denosumab, other medicines, foods, dyes, or  preservatives -pregnant or trying to get pregnant -breast-feeding How should I use this medicine? This medicine is for injection under the skin. It is given by a health care professional in a hospital or clinic setting. If you are getting Prolia, a special MedGuide will be given to you by the pharmacist with each prescription and refill. Be sure to read this information carefully each time. For Prolia, talk to your pediatrician regarding the use of this medicine in children. Special care may be needed. For Delton See, talk to your pediatrician regarding the use of this medicine in children. While this drug may be prescribed for children as young as 13 years for selected conditions, precautions do apply. Overdosage: If you think you've taken too much of this medicine contact a poison control center or emergency room at once. Overdosage: If you think you have taken too much of this medicine contact a poison control center or emergency room at once. NOTE: This medicine is only for you. Do not share this medicine with others. What if I miss a dose? It is important not to miss your dose. Call your doctor or health care professional if you are unable to keep an appointment. What may interact with this medicine? Do not take this medicine with any of the following medications: -other medicines containing denosumab This medicine may also interact with the following medications: -medicines that suppress the immune system -medicines that treat cancer -steroid medicines like prednisone or cortisone This list may not describe all possible interactions. Give your health care provider a list of all the medicines, herbs, non-prescription drugs, or dietary supplements you use. Also tell them if you  smoke, drink alcohol, or use illegal drugs. Some items may interact with your medicine. What should I watch for while using this medicine? Visit your doctor or health care professional for regular checks on your progress.  Your doctor or health care professional may order blood tests and other tests to see how you are doing. Call your doctor or health care professional if you get a cold or other infection while receiving this medicine. Do not treat yourself. This medicine may decrease your body's ability to fight infection. You should make sure you get enough calcium and vitamin D while you are taking this medicine, unless your doctor tells you not to. Discuss the foods you eat and the vitamins you take with your health care professional. See your dentist regularly. Brush and floss your teeth as directed. Before you have any dental work done, tell your dentist you are receiving this medicine. Do not become pregnant while taking this medicine or for 5 months after stopping it. Women should inform their doctor if they wish to become pregnant or think they might be pregnant. There is a potential for serious side effects to an unborn child. Talk to your health care professional or pharmacist for more information. What side effects may I notice from receiving this medicine? Side effects that you should report to your doctor or health care professional as soon as possible: -allergic reactions like skin rash, itching or hives, swelling of the face, lips, or tongue -breathing problems -chest pain -fast, irregular heartbeat -feeling faint or lightheaded, falls -fever, chills, or any other sign of infection -muscle spasms, tightening, or twitches -numbness or tingling -skin blisters or bumps, or is dry, peels, or red -slow healing or unexplained pain in the mouth or jaw -unusual bleeding or bruising Side effects that usually do not require medical attention (Report these to your doctor or health care professional if they continue or are bothersome.): -muscle pain -stomach upset, gas This list may not describe all possible side effects. Call your doctor for medical advice about side effects. You may report side effects to  FDA at 1-800-FDA-1088. Where should I keep my medicine? This medicine is only given in a clinic, doctor's office, or other health care setting and will not be stored at home. NOTE: This sheet is a summary. It may not cover all possible information. If you have questions about this medicine, talk to your doctor, pharmacist, or health care provider.  2015, [employer] Standard. (2012-05-21 12:37:47)

## 2014-10-17 ENCOUNTER — Encounter: Payer: Self-pay | Admitting: Internal Medicine

## 2014-10-17 ENCOUNTER — Telehealth: Payer: Self-pay | Admitting: Medical Oncology

## 2014-10-17 ENCOUNTER — Telehealth: Payer: Self-pay | Admitting: *Deleted

## 2014-10-17 ENCOUNTER — Ambulatory Visit (HOSPITAL_BASED_OUTPATIENT_CLINIC_OR_DEPARTMENT_OTHER): Payer: 59

## 2014-10-17 ENCOUNTER — Telehealth: Payer: Self-pay | Admitting: Internal Medicine

## 2014-10-17 DIAGNOSIS — C7951 Secondary malignant neoplasm of bone: Secondary | ICD-10-CM

## 2014-10-17 DIAGNOSIS — C3431 Malignant neoplasm of lower lobe, right bronchus or lung: Secondary | ICD-10-CM

## 2014-10-17 DIAGNOSIS — Z5189 Encounter for other specified aftercare: Secondary | ICD-10-CM

## 2014-10-17 DIAGNOSIS — C787 Secondary malignant neoplasm of liver and intrahepatic bile duct: Secondary | ICD-10-CM

## 2014-10-17 DIAGNOSIS — C782 Secondary malignant neoplasm of pleura: Secondary | ICD-10-CM

## 2014-10-17 MED ORDER — PEGFILGRASTIM INJECTION 6 MG/0.6ML ~~LOC~~
6.0000 mg | PREFILLED_SYRINGE | Freq: Once | SUBCUTANEOUS | Status: AC
  Administered 2014-10-17: 6 mg via SUBCUTANEOUS
  Filled 2014-10-17: qty 0.6

## 2014-10-17 NOTE — Telephone Encounter (Signed)
No complaints or questions

## 2014-10-17 NOTE — Telephone Encounter (Signed)
Dr Barbee Cough called to notify Dr Julien Nordmann of liver bx path report. Note to [place].

## 2014-10-17 NOTE — Telephone Encounter (Signed)
Responded to patient's email message questioning if his treatment is getting to the left lung. Explained that original diagnosis was his right lung, so this is what is coded. His recurrence is more in left lung and the chemo goes systemic, so it goes everywhere in the body to fight cancer cells. He understands and appreciates information. Asking for liver biopsy results-these have not yet been released.

## 2014-10-17 NOTE — Telephone Encounter (Signed)
Per staff message I have adjusted 12/2 appt  JMW

## 2014-10-17 NOTE — Telephone Encounter (Signed)
s.w. pt and advised on appt time changed to pt request

## 2014-10-17 NOTE — Patient Instructions (Signed)
Pegfilgrastim injection What is this medicine? PEGFILGRASTIM (peg fil GRA stim) is a long-acting granulocyte colony-stimulating factor that stimulates the growth of neutrophils, a type of white blood cell important in the body's fight against infection. It is used to reduce the incidence of fever and infection in patients with certain types of cancer who are receiving chemotherapy that affects the bone marrow. This medicine may be used for other purposes; ask your health care provider or pharmacist if you have questions. COMMON BRAND NAME(S): Neulasta What should I tell my health care provider before I take this medicine? They need to know if you have any of these conditions: -latex allergy -ongoing radiation therapy -sickle cell disease -skin reactions to acrylic adhesives (On-Body Injector only) -an unusual or allergic reaction to pegfilgrastim, filgrastim, other medicines, foods, dyes, or preservatives -pregnant or trying to get pregnant -breast-feeding How should I use this medicine? This medicine is for injection under the skin. If you get this medicine at home, you will be taught how to prepare and give the pre-filled syringe or how to use the On-body Injector. Refer to the patient Instructions for Use for detailed instructions. Use exactly as directed. Take your medicine at regular intervals. Do not take your medicine more often than directed. It is important that you put your used needles and syringes in a special sharps container. Do not put them in a trash can. If you do not have a sharps container, call your pharmacist or healthcare provider to get one. Talk to your pediatrician regarding the use of this medicine in children. Special care may be needed. Overdosage: If you think you have taken too much of this medicine contact a poison control center or emergency room at once. NOTE: This medicine is only for you. Do not share this medicine with others. What if I miss a dose? It is  important not to miss your dose. Call your doctor or health care professional if you miss your dose. If you miss a dose due to an On-body Injector failure or leakage, a new dose should be administered as soon as possible using a single prefilled syringe for manual use. What may interact with this medicine? Interactions have not been studied. Give your health care provider a list of all the medicines, herbs, non-prescription drugs, or dietary supplements you use. Also tell them if you smoke, drink alcohol, or use illegal drugs. Some items may interact with your medicine. This list may not describe all possible interactions. Give your health care provider a list of all the medicines, herbs, non-prescription drugs, or dietary supplements you use. Also tell them if you smoke, drink alcohol, or use illegal drugs. Some items may interact with your medicine. What should I watch for while using this medicine? You may need blood work done while you are taking this medicine. If you are going to need a MRI, CT scan, or other procedure, tell your doctor that you are using this medicine (On-Body Injector only). What side effects may I notice from receiving this medicine? Side effects that you should report to your doctor or health care professional as soon as possible: -allergic reactions like skin rash, itching or hives, swelling of the face, lips, or tongue -dizziness -fever -pain, redness, or irritation at site where injected -pinpoint red spots on the skin -shortness of breath or breathing problems -stomach or side pain, or pain at the shoulder -swelling -tiredness -trouble passing urine Side effects that usually do not require medical attention (report to your doctor   or health care professional if they continue or are bothersome): -bone pain -muscle pain This list may not describe all possible side effects. Call your doctor for medical advice about side effects. You may report side effects to FDA at  1-800-FDA-1088. Where should I keep my medicine? Keep out of the reach of children. Store pre-filled syringes in a refrigerator between 2 and 8 degrees C (36 and 46 degrees F). Do not freeze. Keep in carton to protect from light. Throw away this medicine if it is left out of the refrigerator for more than 48 hours. Throw away any unused medicine after the expiration date. NOTE: This sheet is a summary. It may not cover all possible information. If you have questions about this medicine, talk to your doctor, pharmacist, or health care provider.  2015, [employer]. (2014-02-20 16:14:05)  

## 2014-10-21 ENCOUNTER — Ambulatory Visit: Payer: 59 | Admitting: Adult Health

## 2014-10-21 ENCOUNTER — Other Ambulatory Visit: Payer: 59

## 2014-10-21 ENCOUNTER — Telehealth: Payer: Self-pay | Admitting: Physician Assistant

## 2014-10-21 NOTE — Telephone Encounter (Signed)
Pt left msg to r/s 11/18 labs/ov, called pt back to confirm updated sch to 11/25 labs/AJ..... KJ

## 2014-10-22 ENCOUNTER — Ambulatory Visit: Payer: 59 | Admitting: Physician Assistant

## 2014-10-22 ENCOUNTER — Ambulatory Visit: Payer: 59 | Admitting: Internal Medicine

## 2014-10-22 ENCOUNTER — Other Ambulatory Visit: Payer: 59

## 2014-10-27 ENCOUNTER — Encounter: Payer: Self-pay | Admitting: Pulmonary Disease

## 2014-10-27 NOTE — Telephone Encounter (Signed)
Please advise [name] if pt can stay on prednisone 10 mg daily thanks

## 2014-10-28 ENCOUNTER — Telehealth: Payer: Self-pay | Admitting: Pulmonary Disease

## 2014-10-28 ENCOUNTER — Other Ambulatory Visit: Payer: 59

## 2014-10-28 MED ORDER — PREDNISONE 10 MG PO TABS: 10.0000 mg | ORAL_TABLET | Freq: Every day | ORAL | Status: DC

## 2014-10-28 NOTE — Telephone Encounter (Signed)
Pt is calling back since he has not heard anything.  Satira Anis

## 2014-10-28 NOTE — Telephone Encounter (Signed)
Called and spoke to pt. Pt requesting pred 10mg  refill. Refill sent to preferred pharmacy. Pt verbalized understanding and denied any further questions or concerns at this time.

## 2014-10-29 ENCOUNTER — Other Ambulatory Visit (HOSPITAL_BASED_OUTPATIENT_CLINIC_OR_DEPARTMENT_OTHER): Payer: 59

## 2014-10-29 ENCOUNTER — Telehealth: Payer: Self-pay | Admitting: Internal Medicine

## 2014-10-29 ENCOUNTER — Other Ambulatory Visit: Payer: 59

## 2014-10-29 ENCOUNTER — Ambulatory Visit (HOSPITAL_BASED_OUTPATIENT_CLINIC_OR_DEPARTMENT_OTHER): Payer: 59

## 2014-10-29 ENCOUNTER — Encounter (INDEPENDENT_AMBULATORY_CARE_PROVIDER_SITE_OTHER): Payer: 59 | Admitting: Ophthalmology

## 2014-10-29 ENCOUNTER — Encounter: Payer: Self-pay | Admitting: Physician Assistant

## 2014-10-29 ENCOUNTER — Ambulatory Visit (HOSPITAL_BASED_OUTPATIENT_CLINIC_OR_DEPARTMENT_OTHER): Payer: 59 | Admitting: Physician Assistant

## 2014-10-29 VITALS — BP 145/83 | HR 101 | Temp 97.7°F | Resp 18 | Ht 66.0 in | Wt 186.3 lb

## 2014-10-29 DIAGNOSIS — Z85118 Personal history of other malignant neoplasm of bronchus and lung: Secondary | ICD-10-CM

## 2014-10-29 DIAGNOSIS — H43813 Vitreous degeneration, bilateral: Secondary | ICD-10-CM

## 2014-10-29 DIAGNOSIS — C3492 Malignant neoplasm of unspecified part of left bronchus or lung: Secondary | ICD-10-CM | POA: Insufficient documentation

## 2014-10-29 DIAGNOSIS — C3431 Malignant neoplasm of lower lobe, right bronchus or lung: Secondary | ICD-10-CM

## 2014-10-29 DIAGNOSIS — C7A1 Malignant poorly differentiated neuroendocrine tumors: Secondary | ICD-10-CM

## 2014-10-29 DIAGNOSIS — C7B8 Other secondary neuroendocrine tumors: Secondary | ICD-10-CM

## 2014-10-29 DIAGNOSIS — E860 Dehydration: Secondary | ICD-10-CM

## 2014-10-29 DIAGNOSIS — H35033 Hypertensive retinopathy, bilateral: Secondary | ICD-10-CM

## 2014-10-29 DIAGNOSIS — I10 Essential (primary) hypertension: Secondary | ICD-10-CM

## 2014-10-29 DIAGNOSIS — H34811 Central retinal vein occlusion, right eye: Secondary | ICD-10-CM

## 2014-10-29 DIAGNOSIS — C3482 Malignant neoplasm of overlapping sites of left bronchus and lung: Secondary | ICD-10-CM

## 2014-10-29 LAB — CBC WITH DIFFERENTIAL/PLATELET
BASO%: 0.1 % (ref 0.0–2.0)
Basophils Absolute: 0 10*3/uL (ref 0.0–0.1)
EOS ABS: 0 10*3/uL (ref 0.0–0.5)
EOS%: 0.1 % (ref 0.0–7.0)
HCT: 30.4 % — ABNORMAL LOW (ref 38.4–49.9)
HEMOGLOBIN: 10.1 g/dL — AB (ref 13.0–17.1)
LYMPH%: 4.9 % — ABNORMAL LOW (ref 14.0–49.0)
MCH: 32.5 pg (ref 27.2–33.4)
MCHC: 33.2 g/dL (ref 32.0–36.0)
MCV: 97.7 fL (ref 79.3–98.0)
MONO#: 0.7 10*3/uL (ref 0.1–0.9)
MONO%: 5.2 % (ref 0.0–14.0)
NEUT%: 89.7 % — ABNORMAL HIGH (ref 39.0–75.0)
NEUTROS ABS: 12.7 10*3/uL — AB (ref 1.5–6.5)
Platelets: 86 10*3/uL — ABNORMAL LOW (ref 140–400)
RBC: 3.11 10*6/uL — AB (ref 4.20–5.82)
RDW: 13.7 % (ref 11.0–14.6)
WBC: 14.2 10*3/uL — ABNORMAL HIGH (ref 4.0–10.3)
lymph#: 0.7 10*3/uL — ABNORMAL LOW (ref 0.9–3.3)

## 2014-10-29 LAB — COMPREHENSIVE METABOLIC PANEL (CC13)
ALBUMIN: 3.7 g/dL (ref 3.5–5.0)
ALT: 60 U/L — ABNORMAL HIGH (ref 0–55)
ANION GAP: 10 meq/L (ref 3–11)
AST: 35 U/L — ABNORMAL HIGH (ref 5–34)
Alkaline Phosphatase: 252 U/L — ABNORMAL HIGH (ref 40–150)
BUN: 21.5 mg/dL (ref 7.0–26.0)
CALCIUM: 7.5 mg/dL — AB (ref 8.4–10.4)
CHLORIDE: 107 meq/L (ref 98–109)
CO2: 22 meq/L (ref 22–29)
Creatinine: 1.3 mg/dL (ref 0.7–1.3)
Glucose: 115 mg/dl (ref 70–140)
POTASSIUM: 4.4 meq/L (ref 3.5–5.1)
SODIUM: 139 meq/L (ref 136–145)
TOTAL PROTEIN: 6.8 g/dL (ref 6.4–8.3)
Total Bilirubin: 0.44 mg/dL (ref 0.20–1.20)

## 2014-10-29 MED ORDER — SODIUM CHLORIDE 0.9 % IV SOLN
1000.0000 mL | Freq: Once | INTRAVENOUS | Status: AC
  Administered 2014-10-29: 1000 mL via INTRAVENOUS

## 2014-10-29 NOTE — Patient Instructions (Signed)
Dehydration, Adult Dehydration is when you lose more fluids from the body than you take in. Vital organs like the kidneys, brain, and heart cannot function without a proper amount of fluids and salt. Any loss of fluids from the body can cause dehydration.  CAUSES   Vomiting.  Diarrhea.  Excessive sweating.  Excessive urine output.  Fever. SYMPTOMS  Mild dehydration  Thirst.  Dry lips.  Slightly dry mouth. Moderate dehydration  Very dry mouth.  Sunken eyes.  Skin does not bounce back quickly when lightly pinched and released.  Dark urine and decreased urine production.  Decreased tear production.  Headache. Severe dehydration  Very dry mouth.  Extreme thirst.  Rapid, weak pulse (more than 100 beats per minute at rest).  Cold hands and feet.  Not able to sweat in spite of heat and temperature.  Rapid breathing.  Blue lips.  Confusion and lethargy.  Difficulty being awakened.  Minimal urine production.  No tears. DIAGNOSIS  Your caregiver will diagnose dehydration based on your symptoms and your exam. Blood and urine tests will help confirm the diagnosis. The diagnostic evaluation should also identify the cause of dehydration. TREATMENT  Treatment of mild or moderate dehydration can often be done at home by increasing the amount of fluids that you drink. It is best to drink small amounts of fluid more often. Drinking too much at one time can make vomiting worse. Refer to the home care instructions below. Severe dehydration needs to be treated at the hospital where you will probably be given intravenous (IV) fluids that contain water and electrolytes. HOME CARE INSTRUCTIONS   Ask your caregiver about specific rehydration instructions.  Drink enough fluids to keep your urine clear or pale yellow.  Drink small amounts frequently if you have nausea and vomiting.  Eat as you normally do.  Avoid:  Foods or drinks high in sugar.  Carbonated  drinks.  Juice.  Extremely hot or cold fluids.  Drinks with caffeine.  Fatty, greasy foods.  Alcohol.  Tobacco.  Overeating.  Gelatin desserts.  Wash your hands well to avoid spreading bacteria and viruses.  Only take over-the-counter or prescription medicines for pain, discomfort, or fever as directed by your caregiver.  Ask your caregiver if you should continue all prescribed and over-the-counter medicines.  Keep all follow-up appointments with your caregiver. SEEK MEDICAL CARE IF:  You have abdominal pain and it increases or stays in one area (localizes).  You have a rash, stiff neck, or severe headache.  You are irritable, sleepy, or difficult to awaken.  You are weak, dizzy, or extremely thirsty. SEEK IMMEDIATE MEDICAL CARE IF:   You are unable to keep fluids down or you get worse despite treatment.  You have frequent episodes of vomiting or diarrhea.  You have blood or green matter (bile) in your vomit.  You have blood in your stool or your stool looks black and tarry.  You have not urinated in 6 to 8 hours, or you have only urinated a small amount of very dark urine.  You have a fever.  You faint. MAKE SURE YOU:   Understand these instructions.  Will watch your condition.  Will get help right away if you are not doing well or get worse. Document Released: 11/21/2005 Document Revised: 02/13/2012 Document Reviewed: 07/11/2011 [employer] Patient Information 2015 [employer]. This information is not intended to replace advice given to you by your health care provider. Make sure you discuss any questions you have with your health care   provider.  

## 2014-10-29 NOTE — Patient Instructions (Signed)
Increase you by mouth intake of both food and fluids Continue labs and chemotherapy as scheduled Follow up in 4 weeks with the start of cycle #3

## 2014-10-29 NOTE — Progress Notes (Addendum)
[place] [phone]   [phone]  OFFICE PROGRESS NOTE  [name], MD [address] [place] [address]  DIAGNOSIS:  1.Metastatic non-small cell lung cancer initially diagnosed as Stage IIB/IIIA non-small cell lung cancer, squamous cell carcinoma diagnosed in February of 2012.  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 Last dose of chemotherapy was given on 03/21/2011.  2) status post right VATS, right thoracotomy, right middle and right lower lobectomies under the care of [name] on 06/10/2011.   CURRENT THERAPY: Systemic chemotherapy was carboplatin for AUC of 5 and paclitaxel 175 MG/M2 every 3 weeks with Neulasta support. First cycle on 10/14/2014. Status post 1 cycle.  INTERVAL HISTORY: Todd Villanueva 73 y.o. male returns to the clinic today for a symptom management vist after completing his first cycle of systemic chemotherapy with carboplatin and paclitaxel with Alesse support. Patient states that he had significant malaise after this chemotherapy. He also has had some difficulty with urination and constipation. He also reports trouble sleeping at the steroids. He reports he is not really drinking much water throughout the day. Regarding the constipation he states that his wife actually had to give him an enema which did help significantly with relief from the constipation.  He underwent biopsy of the liver lesion on 10/13/2014, which revealed poorly differentiated neuroendocrine carcinoma the accession number [id number].   MEDICAL HISTORY: Past Medical History  Diagnosis Date  . Hypertension   . lung ca dx'd 02/2011    xrt/chemo comp 03/30/11, stage IIIA  . Hyperlipidemia   . 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 History of angina   . CAD (coronary artery disease)     ALLERGIES:  is allergic to hydrocodone-acetaminophen; tramadol; and zolpidem tartrate.  MEDICATIONS:  Current Outpatient Prescriptions  Medication Sig Dispense Refill  . ALPRAZolam (XANAX) 0.25 MG tablet Take 0.25 mg by mouth 2 (two) times daily as needed for anxiety.     Marland Kitchen aspirin EC 81 MG EC tablet Take 1 tablet (81 mg total) by mouth daily. 30 tablet 1  . bevacizumab (AVASTIN) 1.25 mg/0.1 mL SOLN 1.25 mg by Intravitreal route every 30 (thirty) days.    . budesonide-formoterol (SYMBICORT) 160-4.5 MCG/ACT inhaler Inhale 2 puffs into the lungs 2 (two) times daily.    . citalopram (CELEXA) 40 MG tablet Take 20 mg by mouth daily.   11  . fluticasone (FLONASE) 50 MCG/ACT nasal spray Place 2 sprays into both nostrils daily. 16 g 2  . furosemide (LASIX) 20 MG tablet 20 mg 3 (three) times a week. Mon Wed Fri  3  . isosorbide mononitrate (IMDUR) 30 MG 24 hr tablet Take 30 mg by mouth daily.    Marland Kitchen levalbuterol (XOPENEX) 0.63 MG/3ML nebulizer solution Take 0.63 mg by nebulization 2 (two) times daily.     . naproxen (NAPROSYN) 500 MG tablet Take 500 mg by mouth 2 (two) times daily with a meal.   1  . omeprazole (PRILOSEC) 20 MG capsule Take 40 mg by mouth daily.     . predniSONE (DELTASONE) 10 MG tablet Take 1 tablet (10 mg total) by mouth daily. 30 tablet 5  . simvastatin (ZOCOR) 40 MG tablet Take 40 mg by mouth every Monday,  Wednesday, and Friday.     Marland Kitchen HYDROmorphone (DILAUDID) 2 MG tablet Take 1 tablet (2 mg total) by mouth every 6 (six) hours as needed for severe pain. (Patient not taking: Reported on 10/29/2014) 360 tablet 0  . prochlorperazine (COMPAZINE) 10 MG tablet Take 1 tablet (10 mg total) by mouth every 6 (six) hours as needed for nausea or vomiting. (Patient not taking: Reported on 10/29/2014) 30 tablet 1   Current Facility-Administered Medications  Medication Dose Route Frequency Provider Last Rate Last Dose  . 0.9 %   sodium chloride infusion  1,000 mL Intravenous Once Carlton Adam, PA-C 500 mL/hr at 10/29/14 1408 1,000 mL at 10/29/14 1408    SURGICAL HISTORY:  Past Surgical History  Procedure Laterality Date  . Right vats, rt thoracotomy, rt middle and rt lower  lobectomy  06/10/2011    Dr Arlyce Dice  . Knee arthroscopy    . Tonsillectomy    . Coronary stent placement  JULY,AUGUST 2011  . Orif femoral neck fracture w/ dhs      REVIEW OF SYSTEMS:  Constitutional: positive for fatigue and malaise Eyes: negative Ears, nose, mouth, throat, and face: negative Respiratory: positive for dyspnea on exertion and pleurisy/chest pain Cardiovascular: negative Gastrointestinal: positive for constipation Genitourinary:positive for decreased stream and hesitancy Integument/breast: negative Hematologic/lymphatic: negative Musculoskeletal:negative Neurological: negative Behavioral/Psych: negative Endocrine: negative Allergic/Immunologic: negative   PHYSICAL EXAMINATION: General appearance: alert, cooperative and no distress Head: Normocephalic, without obvious abnormality, atraumatic Neck: no adenopathy Lymph nodes: Cervical, supraclavicular, and axillary nodes normal. Resp: clear to auscultation bilaterally Back: symmetric, no curvature. ROM normal. No CVA tenderness. Cardio: regular rate and rhythm, S1, S2 normal, no murmur, click, rub or gallop GI: soft, non-tender; bowel sounds normal; no masses,  no organomegaly Extremities: extremities normal, atraumatic, no cyanosis or edema Neurologic: Alert and oriented X 3, normal strength and tone. Normal symmetric reflexes. Normal coordination and gait Skin: Decreased skin turgor, + tenting  ECOG PERFORMANCE STATUS: 1 - Symptomatic but completely ambulatory  Blood pressure 145/83, pulse 101, temperature 97.7 F (36.5 C), temperature source Oral, resp. rate 18, height 5\' 6"  (1.676 m), weight 186 lb 4.8 oz (84.505 kg), SpO2 99 %.  LABORATORY DATA: Lab  Results  Component Value Date   WBC 14.2* 10/29/2014   HGB 10.1* 10/29/2014   HCT 30.4* 10/29/2014   MCV 97.7 10/29/2014   PLT 86* 10/29/2014      Chemistry      Component Value Date/Time   NA 139 10/29/2014 1133   NA 139 06/10/2014 1611   NA 142 01/10/2012 1205   K 4.4 10/29/2014 1133   K 4.1 06/10/2014 1611   K 3.9 01/10/2012 1205   CL 105 06/10/2014 1611   CL 107 10/15/2012 1521   CL 102 01/10/2012 1205   CO2 22 10/29/2014 1133   CO2 26 06/10/2014 1611   CO2 26 01/10/2012 1205   BUN 21.5 10/29/2014 1133   BUN 36* 06/10/2014 1611   BUN 27* 01/10/2012 1205   CREATININE 1.3 10/29/2014 1133   CREATININE 1.8* 06/10/2014 1611   CREATININE 1.1 01/10/2012 1205      Component Value Date/Time   CALCIUM 7.5* 10/29/2014 1133   CALCIUM 8.9 06/10/2014 1611   CALCIUM 8.8 01/10/2012 1205   ALKPHOS 252* 10/29/2014 1133   ALKPHOS 156* 01/28/2014 0510   ALKPHOS 271* 01/10/2012 1205   AST 35* 10/29/2014 1133   AST 25 01/28/2014 0510   AST 28 01/10/2012 1205   ALT 60* 10/29/2014 1133  ALT 26 01/28/2014 0510   ALT 28 01/10/2012 1205   BILITOT 0.44 10/29/2014 1133   BILITOT 0.4 01/28/2014 0510   BILITOT 0.60 01/10/2012 1205       RADIOGRAPHIC STUDIES: Ct Abdomen Pelvis Wo Contrast  10/15/2014   CLINICAL DATA:  Recent liver biopsy. Now with sudden onset of pain. Shortness of breath.  EXAM: CT ABDOMEN AND PELVIS WITHOUT CONTRAST  TECHNIQUE: Multidetector CT imaging of the abdomen and pelvis was performed following the standard protocol without IV contrast.  COMPARISON:  PET-CT from 09/30/2014  FINDINGS: Lower chest: Again noted is of loculated pleural fluid within the posterior medial right lung base. Overlying airspace consolidation is again identified. Small left pleural effusion is stable. Multiple left-sided pleural nodules are identified compatible with pleural metastasis.  Hepatobiliary: Multiple ill-defined low-attenuation lesions are identified in both lobes of liver. The  index lesion along the dome of liver measures 5.5 cm, image 13/series 2. This is increased from 4 cm previously. All lesion in the lateral right hepatic lobe measures 2.2 cm, image 20/ series 2. Previously 1.7 cm. Normal appearance of the gallbladder. There is no biliary dilatation.  Pancreas: The pancreas is unremarkable.  Spleen: Appears normal.  Adrenals/Urinary Tract: The adrenal glands are normal. Unremarkable appearance of the kidneys. The urinary bladder appears normal for degree of distention.  Stomach/Bowel: The stomach is normal. The small bowel loops are on unremarkable. The terminal ileum is visualized and appears normal. Normal appearance of the colon.  Vascular/Lymphatic: Calcified atherosclerotic disease involves the abdominal aorta. The aorta has a AP dimension of 2.5 cm, image 41/series 2. There is no retroperitoneal adenopathy. No mesenteric adenopathy.  Reproductive: The prostate gland appears enlarged. Symmetric appearance of the seminal vesicles.  Other: No evidence for hemo peritoneum. No free fluid or abnormal fluid collections identified.  Musculoskeletal: Numerous right posterior lateral rib deformities are identified. These appear posttraumatic but may also be postsurgical if the patient has had lung cancer surgery involving the right hemi thorax. 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 This is compatible with stage III necrosis. Degenerative disc disease is noted at L4-5 and L5-S1.  IMPRESSION: 1. No acute findings identified within the abdomen or pelvis. No complications from recent liver biopsy. 2. Again noted is evidence of trans pleural spread of tumor within the left hemi thorax. 3. Increase in size of liver lesions compatible with progression of liver metastasis 4. Atherosclerotic disease and abdominal aortic ectasia. Ectatic abdominal aorta at risk for aneurysm development. Recommend  followup by [place] in 5 years. This recommendation follows ACR consensus guidelines: White Paper of the ACR Incidental Findings Committee II on Vascular Findings. J Am Coll Radiol 2013; 10:789-794. 5. Right hip avascular necrosis 6. Multiple right posterior rib deformities appear subacute to chronic.   Electronically Signed   By: Kerby Moors M.D.   On: 10/15/2014 08:35   Mr Jeri Cos ZO Contrast  10/03/2014   CLINICAL DATA:  Right lower lobe lung cancer status post chemo radiation. Staging of lung cancer recurrence.  EXAM: MRI HEAD WITHOUT AND WITH CONTRAST  TECHNIQUE: Multiplanar, multiecho pulse sequences of the brain and surrounding structures were obtained without and with intravenous contrast.  CONTRAST:  11mL MULTIHANCE GADOBENATE DIMEGLUMINE 529 MG/ML IV SOLN  COMPARISON:  01/13/2011  FINDINGS: There is no evidence of acute infarct, intracranial hemorrhage, solid mass, midline shift, or extra-axial fluid collection. There is mild generalized cerebral atrophy, similar to the  prior study. Patchy periventricular white matter T2 hyperintensities have minimally progressed from the prior study and are nonspecific but compatible with mild chronic small vessel ischemic disease.  Postcontrast images are mildly degraded by motion artifact. No enhancing lesions are identified. 1.4 x 0.8 cm nonenhancing cystic focus in the anterior right temporal lobe with small amount of parenchymal T2 hyperintensity posterior to it is unchanged and of uncertain etiology but favored to be benign.  Prior bilateral cataract extraction is noted. Paranasal sinuses and mastoid air cells are clear. Major intracranial vascular flow voids are preserved.  IMPRESSION: 1. No evidence of intracranial metastases. 2. Unchanged cystic focus in the anterior right temporal lobe, of uncertain etiology but favored to be benign. 3. Mild chronic small vessel ischemic disease and cerebral atrophy.   Electronically Signed   By: Logan Bores   On: 10/03/2014  17:12   Nm Pet Image Restag (ps) Skull Base To Thigh  09/30/2014   CLINICAL DATA:  Subsequent treatment strategy for lung carcinoma.  EXAM: NUCLEAR MEDICINE PET SKULL BASE TO THIGH  TECHNIQUE: 9.5 mCi F-18 FDG was injected intravenously. Full-ring PET imaging was performed from the skull base to thigh after the radiotracer. CT data was obtained and used for attenuation correction and anatomic localization.  FASTING BLOOD GLUCOSE:  Value: 83 mg/dl  COMPARISON:  PET-CT 05/10/2011, CT 09/07/2014, 02/02/2014  FINDINGS: NECK  No hypermetabolic lymph nodes in the neck.  CHEST  Left suprahilar mass measuring 3.8 x 3.2 cm (image 77, series 4) with intense metabolic activity (SUV max 27). Multiple round hypermetabolic prevascular and retrosternal lymph nodes consistent with mediastinal nodal metastasis. Multiple pleural-based metastasis in the left hemi thorax including 12 mm lesion on image 93 with SUV max 21. Larger lesion at the left lung base measuring 3.9 cm with SUV max 20 (image 109).  ABDOMEN/PELVIS  Multiple sites of hepatic metastasis. Lesion superior right hepatic lobe measures 4 cm on image 99 with intense metabolic activity SUV max = 38 . Approximately 8-10 metastasis within the liver.  Adrenal glands are normal. Several hypermetabolic periportal lymph nodes present. Hypermetabolic periaortic or iliac lymph nodes.  SKELETON  Multiple sites of new metabolic activity within the spine skeleton. Lesions include the right iliac wing on image 144 and the right sacrum on image 153. There is a subtle lucent lesion within the sacrum at this level. The iliac lesions difficult to see on CT. There is a lesion within the dens of the C2 vertebral body.  IMPRESSION: 1. Lung cancer recurrence with left suprahilar mass and multiple pleural metastasis in the left hemi thorax. 2. Multiple hypermetabolic mediastinal nodal metastasis. 3. Multifocal hypermetabolic liver metastasis. 4. Mild periportal nodal metastasis. 5. Multiple  sites of skeletal metastasis involving the iliac bones, sacrum, and cervical spine.   Electronically Signed   By: Suzy Bouchard M.D.   On: 09/30/2014 10:27   Ct Biopsy  10/14/2014   CLINICAL DATA:  Recurrent lung carcinoma with evidence of metastatic disease to the liver by PET scan. Request has been made to perform biopsy to confirm distend metastatic disease.  EXAM: CT GUIDED CORE BIOPSY OF LIVER  COMPARISON:  PET scan on 09/30/2014  ANESTHESIA/SEDATION: 2.0  Mg IV Versed; 100 mcg IV Fentanyl  Total Moderate Sedation Time: 20 minutes.  PROCEDURE: The procedure risks, benefits, and alternatives were explained to the patient. Questions regarding the procedure were encouraged and answered. The patient understands and consents to the procedure.  Ultrasound was used to image to the liver.  Image guidance was then converted to CT guidance. The right abdominal wall was prepped with Betadine in a sterile fashion, and a sterile drape was applied covering the operative field. A sterile gown and sterile gloves were used for the procedure. A time-out procedure was performed. Local anesthesia was provided with 1% Lidocaine.  Under CT guidance, a 17 gauge needle was advanced into the right lobe of the liver. After confirming needle tip position, coaxial core biopsy samples were obtained with an 18 gauge automated device. 3 separate samples were obtained and submitted in formalin. Gel-Foam pledgets were advanced through the outer needle as the needle was retracted. Additional CT was performed.  COMPLICATIONS: None  FINDINGS: Lesions depicted by PET scan in both right and left lobes of the liver were not visualized as discrete lesions by ultrasound. CT was performed of the liver demonstrating a focal low-attenuation lesion within the peripheral right lobe of the liver. This corresponds to one of the hypermetabolic lesions seen by PET scan. Solid tissue was obtained.  IMPRESSION: CT-guided core biopsy performed of right  hepatic lesion. As above, image guidance was converted to CT guidance for the procedure after ultrasound could not demonstrate discrete lesions in the liver.   Electronically Signed   By: Aletta Edouard M.D.   On: 10/13/2014 17:08    ASSESSMENT AND PLAN: This is a very pleasant 73 years old white male with metastatic non-small cell lung cancer initially diagnosed as stage IIB/IIIa non-small cell lung cancer status post concurrent chemoradiation followed by right middle and right lower lobectomies and has been observation since July of 2002. The recent PET scan showed evidence for widely metastatic disease involving the left suprahilar mass as well as multiple pleural metastasis in the left hemothorax in addition to multiple mediastinal lymphadenopathy and liver lesion as well as skeletal metastasis.  The MRI of the brain showed no evidence for metastatic disease. Biopsy of the liver lesion was positive for poorly differentiated neuritic neuroendocrine carcinoma. Patient is currently being treated with systemic chemotherapy in the form of carboplatin for an AUC of 5 and paclitaxel at 175 mg/m given every 3 weeks with Neulasta support status post 1 cycle. Had some difficulty with his first cycle of chemotherapy was generalized malaise constipation and just decreased by mouth intake both of food and fluids. Patient was discussed with and also seen by Dr. Julien Nordmann. He is a bit dehydrated today we will arrange to give him 1 L of normal saline IV fluids today. Patient is encouraged to continue his weekly labs and follow-up in one week prior to the start of cycle #2 of his systemic chemotherapy with carboplatin and paclitaxel with Neulasta support as scheduled. He will continue weekly labs as scheduled and return in 4 weeks for another symptom management visit prior to the start of cycle #3. The patient will increase his by mouth intake both of food and fluids will start taking a stool softener daily. He was advised  to call immediately if he has any concerning symptoms in the interval. The patient voices understanding of current disease status and treatment options and is in agreement with the current care plan.  All questions were answered. The patient knows to call the clinic with any problems, questions or concerns. We can certainly see the patient much sooner if necessary.  Carlton Adam, PA-C 10/29/2014  ADDENDUM: Hematology/Oncology Attending: I had a face to face encounter with the patient. I recommended his care plan. This is a very pleasant 73 years  old white male recently diagnosed with metastatic non-small cell lung cancer, neuroendocrine carcinoma. He started systemic chemotherapy with carboplatin and paclitaxel is status post 1 cycle. He tolerated the first cycle of his treatment fairly well except for lack of appetite and poor by mouth intake. He denied having any significant fever or chills. He had mild nausea but no vomiting. We will arrange for the patient to receive 1 L of IV normal saline today. He was started cycle #2 neck week. The patient would come back for follow-up visit in 4 weeks with the start of cycle #3. He was advised to call immediately if he has any concerning symptoms in the interval.  Disclaimer: This note was dictated with voice recognition software. Similar sounding words can inadvertently be transcribed and may not be corrected upon review. [name], MD 10/30/2014

## 2014-10-29 NOTE — Telephone Encounter (Signed)
gv pt appt schedule for dec. per AJ f/u should be in one mth w/MM not 2wks w/AJ. also per AJ ok for pt to have next tx 12/23.

## 2014-11-04 ENCOUNTER — Other Ambulatory Visit: Payer: 59

## 2014-11-05 ENCOUNTER — Ambulatory Visit (HOSPITAL_BASED_OUTPATIENT_CLINIC_OR_DEPARTMENT_OTHER): Payer: 59

## 2014-11-05 ENCOUNTER — Other Ambulatory Visit: Payer: Self-pay | Admitting: Internal Medicine

## 2014-11-05 ENCOUNTER — Other Ambulatory Visit: Payer: 59

## 2014-11-05 ENCOUNTER — Other Ambulatory Visit (HOSPITAL_BASED_OUTPATIENT_CLINIC_OR_DEPARTMENT_OTHER): Payer: 59

## 2014-11-05 DIAGNOSIS — Z5111 Encounter for antineoplastic chemotherapy: Secondary | ICD-10-CM

## 2014-11-05 DIAGNOSIS — C3431 Malignant neoplasm of lower lobe, right bronchus or lung: Secondary | ICD-10-CM

## 2014-11-05 LAB — CBC WITH DIFFERENTIAL/PLATELET
BASO%: 0.5 % (ref 0.0–2.0)
Basophils Absolute: 0 10*3/uL (ref 0.0–0.1)
EOS%: 0.2 % (ref 0.0–7.0)
Eosinophils Absolute: 0 10*3/uL (ref 0.0–0.5)
HCT: 30.2 % — ABNORMAL LOW (ref 38.4–49.9)
HGB: 10 g/dL — ABNORMAL LOW (ref 13.0–17.1)
LYMPH#: 0.8 10*3/uL — AB (ref 0.9–3.3)
LYMPH%: 9.7 % — AB (ref 14.0–49.0)
MCH: 32.6 pg (ref 27.2–33.4)
MCHC: 33 g/dL (ref 32.0–36.0)
MCV: 98.9 fL — ABNORMAL HIGH (ref 79.3–98.0)
MONO#: 0.8 10*3/uL (ref 0.1–0.9)
MONO%: 9.2 % (ref 0.0–14.0)
NEUT%: 80.4 % — ABNORMAL HIGH (ref 39.0–75.0)
NEUTROS ABS: 6.6 10*3/uL — AB (ref 1.5–6.5)
PLATELETS: 210 10*3/uL (ref 140–400)
RBC: 3.05 10*6/uL — AB (ref 4.20–5.82)
RDW: 14 % (ref 11.0–14.6)
WBC: 8.3 10*3/uL (ref 4.0–10.3)

## 2014-11-05 LAB — COMPREHENSIVE METABOLIC PANEL (CC13)
ALT: 35 U/L (ref 0–55)
ANION GAP: 10 meq/L (ref 3–11)
AST: 25 U/L (ref 5–34)
Albumin: 3.3 g/dL — ABNORMAL LOW (ref 3.5–5.0)
Alkaline Phosphatase: 167 U/L — ABNORMAL HIGH (ref 40–150)
BUN: 24.1 mg/dL (ref 7.0–26.0)
CO2: 23 meq/L (ref 22–29)
Calcium: 8.4 mg/dL (ref 8.4–10.4)
Chloride: 105 mEq/L (ref 98–109)
Creatinine: 1.3 mg/dL (ref 0.7–1.3)
Glucose: 102 mg/dl (ref 70–140)
POTASSIUM: 4 meq/L (ref 3.5–5.1)
SODIUM: 138 meq/L (ref 136–145)
TOTAL PROTEIN: 6.1 g/dL — AB (ref 6.4–8.3)
Total Bilirubin: 0.52 mg/dL (ref 0.20–1.20)

## 2014-11-05 MED ORDER — DEXAMETHASONE SODIUM PHOSPHATE 20 MG/5ML IJ SOLN
INTRAMUSCULAR | Status: AC
  Filled 2014-11-05: qty 5

## 2014-11-05 MED ORDER — DIPHENHYDRAMINE HCL 50 MG/ML IJ SOLN
50.0000 mg | Freq: Once | INTRAMUSCULAR | Status: AC
  Administered 2014-11-05: 50 mg via INTRAVENOUS

## 2014-11-05 MED ORDER — ONDANSETRON 16 MG/50ML IVPB (CHCC)
16.0000 mg | Freq: Once | INTRAVENOUS | Status: AC
  Administered 2014-11-05: 16 mg via INTRAVENOUS

## 2014-11-05 MED ORDER — PACLITAXEL CHEMO INJECTION 300 MG/50ML
175.0000 mg/m2 | Freq: Once | INTRAVENOUS | Status: AC
  Administered 2014-11-05: 348 mg via INTRAVENOUS
  Filled 2014-11-05: qty 58

## 2014-11-05 MED ORDER — FAMOTIDINE IN NACL 20-0.9 MG/50ML-% IV SOLN
INTRAVENOUS | Status: AC
  Filled 2014-11-05: qty 50

## 2014-11-05 MED ORDER — DEXAMETHASONE SODIUM PHOSPHATE 20 MG/5ML IJ SOLN
20.0000 mg | Freq: Once | INTRAMUSCULAR | Status: AC
  Administered 2014-11-05: 20 mg via INTRAVENOUS

## 2014-11-05 MED ORDER — SODIUM CHLORIDE 0.9 % IV SOLN
Freq: Once | INTRAVENOUS | Status: AC
  Administered 2014-11-05: 11:00:00 via INTRAVENOUS

## 2014-11-05 MED ORDER — ONDANSETRON 16 MG/50ML IVPB (CHCC)
INTRAVENOUS | Status: AC
  Filled 2014-11-05: qty 16

## 2014-11-05 MED ORDER — FAMOTIDINE IN NACL 20-0.9 MG/50ML-% IV SOLN
20.0000 mg | Freq: Once | INTRAVENOUS | Status: AC
  Administered 2014-11-05: 20 mg via INTRAVENOUS

## 2014-11-05 MED ORDER — DIPHENHYDRAMINE HCL 50 MG/ML IJ SOLN
INTRAMUSCULAR | Status: AC
  Filled 2014-11-05: qty 1

## 2014-11-05 MED ORDER — SODIUM CHLORIDE 0.9 % IV SOLN
441.5000 mg | Freq: Once | INTRAVENOUS | Status: AC
  Administered 2014-11-05: 440 mg via INTRAVENOUS
  Filled 2014-11-05: qty 44

## 2014-11-05 NOTE — Patient Instructions (Signed)
[facility] Discharge Instructions for Patients Receiving Chemotherapy  Today you received the following chemotherapy agents: Taxol and Carboplatin.  To help prevent nausea and vomiting after your treatment, we encourage you to take your nausea medication as prescribed.   If you develop nausea and vomiting that is not controlled by your nausea medication, call the clinic.   BELOW ARE SYMPTOMS THAT SHOULD BE REPORTED IMMEDIATELY:  *FEVER GREATER THAN 100.5 F  *CHILLS WITH OR WITHOUT FEVER  NAUSEA AND VOMITING THAT IS NOT CONTROLLED WITH YOUR NAUSEA MEDICATION  *UNUSUAL SHORTNESS OF BREATH  *UNUSUAL BRUISING OR BLEEDING  TENDERNESS IN MOUTH AND THROAT WITH OR WITHOUT PRESENCE OF ULCERS  *URINARY PROBLEMS  *BOWEL PROBLEMS  UNUSUAL RASH Items with * indicate a potential emergency and should be followed up as soon as possible.  Feel free to call the clinic you have any questions or concerns. The clinic phone number is [phone].    

## 2014-11-06 ENCOUNTER — Ambulatory Visit (HOSPITAL_BASED_OUTPATIENT_CLINIC_OR_DEPARTMENT_OTHER): Payer: 59

## 2014-11-06 DIAGNOSIS — C3431 Malignant neoplasm of lower lobe, right bronchus or lung: Secondary | ICD-10-CM

## 2014-11-06 DIAGNOSIS — C7B8 Other secondary neuroendocrine tumors: Secondary | ICD-10-CM

## 2014-11-06 DIAGNOSIS — C7A1 Malignant poorly differentiated neuroendocrine tumors: Secondary | ICD-10-CM

## 2014-11-06 DIAGNOSIS — Z5189 Encounter for other specified aftercare: Secondary | ICD-10-CM

## 2014-11-06 MED ORDER — PEGFILGRASTIM INJECTION 6 MG/0.6ML ~~LOC~~
6.0000 mg | PREFILLED_SYRINGE | Freq: Once | SUBCUTANEOUS | Status: AC
  Administered 2014-11-06: 6 mg via SUBCUTANEOUS
  Filled 2014-11-06: qty 0.6

## 2014-11-06 NOTE — Patient Instructions (Signed)
Pegfilgrastim injection What is this medicine? PEGFILGRASTIM (peg fil GRA stim) is a long-acting granulocyte colony-stimulating factor that stimulates the growth of neutrophils, a type of white blood cell important in the body's fight against infection. It is used to reduce the incidence of fever and infection in patients with certain types of cancer who are receiving chemotherapy that affects the bone marrow. This medicine may be used for other purposes; ask your health care provider or pharmacist if you have questions. COMMON BRAND NAME(S): Neulasta What should I tell my health care provider before I take this medicine? They need to know if you have any of these conditions: -latex allergy -ongoing radiation therapy -sickle cell disease -skin reactions to acrylic adhesives (On-Body Injector only) -an unusual or allergic reaction to pegfilgrastim, filgrastim, other medicines, foods, dyes, or preservatives -pregnant or trying to get pregnant -breast-feeding How should I use this medicine? This medicine is for injection under the skin. If you get this medicine at home, you will be taught how to prepare and give the pre-filled syringe or how to use the On-body Injector. Refer to the patient Instructions for Use for detailed instructions. Use exactly as directed. Take your medicine at regular intervals. Do not take your medicine more often than directed. It is important that you put your used needles and syringes in a special sharps container. Do not put them in a trash can. If you do not have a sharps container, call your pharmacist or healthcare provider to get one. Talk to your pediatrician regarding the use of this medicine in children. Special care may be needed. Overdosage: If you think you have taken too much of this medicine contact a poison control center or emergency room at once. NOTE: This medicine is only for you. Do not share this medicine with others. What if I miss a dose? It is  important not to miss your dose. Call your doctor or health care professional if you miss your dose. If you miss a dose due to an On-body Injector failure or leakage, a new dose should be administered as soon as possible using a single prefilled syringe for manual use. What may interact with this medicine? Interactions have not been studied. Give your health care provider a list of all the medicines, herbs, non-prescription drugs, or dietary supplements you use. Also tell them if you smoke, drink alcohol, or use illegal drugs. Some items may interact with your medicine. This list may not describe all possible interactions. Give your health care provider a list of all the medicines, herbs, non-prescription drugs, or dietary supplements you use. Also tell them if you smoke, drink alcohol, or use illegal drugs. Some items may interact with your medicine. What should I watch for while using this medicine? You may need blood work done while you are taking this medicine. If you are going to need a MRI, CT scan, or other procedure, tell your doctor that you are using this medicine (On-Body Injector only). What side effects may I notice from receiving this medicine? Side effects that you should report to your doctor or health care professional as soon as possible: -allergic reactions like skin rash, itching or hives, swelling of the face, lips, or tongue -dizziness -fever -pain, redness, or irritation at site where injected -pinpoint red spots on the skin -shortness of breath or breathing problems -stomach or side pain, or pain at the shoulder -swelling -tiredness -trouble passing urine Side effects that usually do not require medical attention (report to your doctor   or health care professional if they continue or are bothersome): -bone pain -muscle pain This list may not describe all possible side effects. Call your doctor for medical advice about side effects. You may report side effects to FDA at  1-800-FDA-1088. Where should I keep my medicine? Keep out of the reach of children. Store pre-filled syringes in a refrigerator between 2 and 8 degrees C (36 and 46 degrees F). Do not freeze. Keep in carton to protect from light. Throw away this medicine if it is left out of the refrigerator for more than 48 hours. Throw away any unused medicine after the expiration date. NOTE: This sheet is a summary. It may not cover all possible information. If you have questions about this medicine, talk to your doctor, pharmacist, or health care provider.  2015, [employer]. (2014-02-20 16:14:05)  

## 2014-11-10 ENCOUNTER — Telehealth: Payer: Self-pay | Admitting: Pulmonary Disease

## 2014-11-10 ENCOUNTER — Encounter: Payer: Self-pay | Admitting: Pulmonary Disease

## 2014-11-10 NOTE — Telephone Encounter (Signed)
LMTCB

## 2014-11-11 NOTE — Telephone Encounter (Signed)
Pt scheduled for OV with Dr [name] at 330p on 11/14/14 Pt needs to qualify for O2 and OV notes faxed to DME. We do not have qualifying sats in pt chart.  Pt is requesting smaller POC.  Nothing further needed.

## 2014-11-13 ENCOUNTER — Encounter (HOSPITAL_COMMUNITY): Payer: Self-pay | Admitting: Cardiology

## 2014-11-13 ENCOUNTER — Encounter: Payer: Self-pay | Admitting: Internal Medicine

## 2014-11-13 NOTE — Telephone Encounter (Signed)
Furosemide is not working as much. Urinating about every 3 hours, quantity decreased. The quantity is decreased for about 3 days following chemo. He is asking about taking his lasix more frequently, currently prescribed every Mon, Wed, Fri. His Rx only has #12 and he would need new Rx. Respiratory - Dr [name] gave him an zpack and he took for 3 days and lost the last pill. Pt emailed dr [name] today for more because you are not better yet. Improved but not better. Non productive cough. No fever. Worried he will get sicker and end up in hospital.

## 2014-11-14 ENCOUNTER — Ambulatory Visit (INDEPENDENT_AMBULATORY_CARE_PROVIDER_SITE_OTHER): Payer: 59 | Admitting: Pulmonary Disease

## 2014-11-14 ENCOUNTER — Encounter: Payer: Self-pay | Admitting: Pulmonary Disease

## 2014-11-14 VITALS — BP 126/64 | HR 116 | Ht 66.5 in | Wt 186.4 lb

## 2014-11-14 DIAGNOSIS — R0600 Dyspnea, unspecified: Secondary | ICD-10-CM

## 2014-11-14 DIAGNOSIS — I208 Other forms of angina pectoris: Secondary | ICD-10-CM

## 2014-11-14 DIAGNOSIS — R0982 Postnasal drip: Secondary | ICD-10-CM

## 2014-11-14 DIAGNOSIS — J432 Centrilobular emphysema: Secondary | ICD-10-CM

## 2014-11-14 MED ORDER — ALBUTEROL SULFATE 108 (90 BASE) MCG/ACT IN AEPB: 2.0000 | INHALATION_SPRAY | Freq: Four times a day (QID) | RESPIRATORY_TRACT | Status: AC | PRN

## 2014-11-14 MED ORDER — IPRATROPIUM BROMIDE 0.03 % NA SOLN: 2.0000 | Freq: Three times a day (TID) | NASAL | Status: DC | PRN

## 2014-11-14 NOTE — Progress Notes (Signed)
Chief Complaint  Patient presents with  . Follow-up    Pt wanting to qualify for O2- requests order for POC. Requests nasal spray.     History of Present Illness: Todd Villanueva is a 73 y.o. male former smoker with COPD/emphysema.  He has been getting more trouble with dyspnea on exertion.  He is okay at rest.  He is not having much cough or wheeze.  He has problems with his sinuses, and post-nasal drip.  He gets phlegm stuck in his throat, and this can make him gag before he can cough something out.  His PCP increased prednisone, and this seems to give him more energy.  He denies chest pain, or leg swelling.  He maintained his oxygenation on ambulatory oximetry with room air today.  Labs from 12/012/15 reviewed.  TESTS: Echo 02/15/12>>EF 55% PFT 04/19/12>>FEV1 1.76 (65%), FEV1% 68, TLC 4.22 (70%), DLCO 37%, no BD CT chest 10/10/12>>emphysema, 6 mm nodule LLL, no change consolidative change Rt lung base Echo 01/28/14 >> EF 35 to 54%, grade 1 diastolic dysfx CT chest 2/70/62 >> centrilobular/paraseptal emphysema, Rt paratracheal LAN 1.5 cm CT chest 09/17/14 >> progression of mediastinal/Lt hilar LAN, Lt pleural nodule, new hepatic mets, radiation changes on Rt  PMHx >> HTN, NSCLC, CAD, systolic CHF  PSHx, Medications, Allergies, Fhx, Shx reviewed.  Physical Exam: Blood pressure 126/64, pulse 116, height 5' 6.5" (1.689 m), weight 186 lb 6.4 oz (84.55 kg), SpO2 97 %. Body mass index is 29.64 kg/(m^2).  General - No distress ENT - No sinus tenderness, MP 4, no oral exudate, no LAN Cardiac - s1s2 regular, no murmur Chest - No wheeze/rales/dullness Back - No focal tenderness Abd - Soft, non-tender Ext - No edema Neuro - Normal strength Skin - No rashes Psych - normal mood, and behavior   Assessment/Plan:  COPD with emphysema. Plan: - he is to continue prednisone taper from his PCP - I don't think he needs Abx at this time - he is to continue symbicort with prn  proair  Stage IV NSCLC.  S/p Rt middle and lower lobectomy in 2012, chemotherapy, radiation therapy.  Recently found to have liver/skeletal mets. Plan: - he is followed by Dr. Earlie Villanueva with oncology - ?if he should be assessed by palliative care/hospice >> defer to oncology  Upper airway cough syndrome with post-nasal drip >> did not improved with nasal steriods. Plan: - will have him try atrovent nasal spray  Dyspnea >> likely multifactorial related to COPD, CHF, and deconditioning. Plan: - advised him to contact his cardiology to assess whether he needs adjustment to his cardiac regimen - he did not qualify for daytime supplemental oxygen based on ambulatory oximetry today  Todd Mires, MD [facility] Pager:  [phone] 11/14/2014, 3:54 PM

## 2014-11-14 NOTE — Patient Instructions (Signed)
Proair two puffs every 6 hours as needed Atrovent nose spray two sprays up to 3 timer per day as needed Follow up in 4 months

## 2014-11-17 ENCOUNTER — Ambulatory Visit (INDEPENDENT_AMBULATORY_CARE_PROVIDER_SITE_OTHER): Payer: 59 | Admitting: Cardiology

## 2014-11-17 ENCOUNTER — Encounter: Payer: Self-pay | Admitting: Cardiology

## 2014-11-17 VITALS — BP 118/74 | HR 104 | Ht 66.5 in | Wt 187.0 lb

## 2014-11-17 DIAGNOSIS — I2510 Atherosclerotic heart disease of native coronary artery without angina pectoris: Secondary | ICD-10-CM

## 2014-11-17 DIAGNOSIS — I208 Other forms of angina pectoris: Secondary | ICD-10-CM

## 2014-11-17 DIAGNOSIS — I10 Essential (primary) hypertension: Secondary | ICD-10-CM

## 2014-11-17 DIAGNOSIS — I2583 Coronary atherosclerosis due to lipid rich plaque: Secondary | ICD-10-CM

## 2014-11-17 DIAGNOSIS — I5041 Acute combined systolic (congestive) and diastolic (congestive) heart failure: Secondary | ICD-10-CM

## 2014-11-17 DIAGNOSIS — J438 Other emphysema: Secondary | ICD-10-CM

## 2014-11-17 DIAGNOSIS — C3431 Malignant neoplasm of lower lobe, right bronchus or lung: Secondary | ICD-10-CM

## 2014-11-17 MED ORDER — POTASSIUM CHLORIDE CRYS ER 20 MEQ PO TBCR: EXTENDED_RELEASE_TABLET | ORAL | Status: DC

## 2014-11-17 MED ORDER — FUROSEMIDE 40 MG PO TABS: ORAL_TABLET | ORAL | Status: DC

## 2014-11-17 NOTE — Patient Instructions (Signed)
Take Furosemide 40 mg for the next 3 days. Take Potassium Chloride for the next 3 days. Continue all other medications as listed.  See Dr Marlou Porch on Thursday.

## 2014-11-17 NOTE — Progress Notes (Signed)
[facility]. [address], [place]  [address] Phone: [phone] Fax:  [phone]  Date:  11/17/2014   ID:  Todd Villanueva, Todd Villanueva [birth year], MRN [id number]  PCP:  [name], MD   History of Present Illness: Todd Villanueva is a 73 y.o. male with stage III lung cancer, COPD, with ischemic cardiomyopathy ejection fraction 26-37%, grade 1 diastolic dysfunction here for evaluation of significant shortness of breath area  I discussed with Dr. [name] earlier this morning who talked with [name], who checked oxygen saturations which were normal. He was thinking that his current symptoms were more cardiac related. He is struggling at rest. Very short of breath. Dr. [name] recently increased his steroid. Previously he had stent placement in 2011 RCA and LAD. Prior NUC stress 5/14 low risk with no ischemia.   He also had prior rectus sheath hematoma.   Creat 1.3 currently but at one point he did develop creatinine of 2.7. We will monitor renal function.  Wt Readings from Last 3 Encounters:  11/17/14 187 lb (84.823 kg)  11/14/14 186 lb 6.4 oz (84.55 kg)  10/29/14 186 lb 4.8 oz (84.505 kg)     Past Medical History  Diagnosis Date  . Hypertension   . lung ca dx'd 02/2011    xrt/chemo comp 03/30/11, stage IIIA  . Hyperlipidemia   . 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 History of angina   . CAD (coronary artery disease)     Past Surgical History  Procedure Laterality Date  . Right vats, rt thoracotomy, rt middle and rt lower  lobectomy  06/10/2011    Dr Arlyce Dice  . Knee arthroscopy    . Tonsillectomy    . Coronary stent placement  JULY,AUGUST 2011  . Orif femoral neck fracture w/ dhs    . Left heart catheterization with coronary angiogram N/A 06/17/2014    Procedure: LEFT HEART CATHETERIZATION WITH CORONARY ANGIOGRAM;  Surgeon: Candee Furbish, MD;  Location: [facility] CATH LAB;  Service:  Cardiovascular;  Laterality: N/A;    Current Outpatient Prescriptions  Medication Sig Dispense Refill  . Albuterol Sulfate (PROAIR RESPICLICK) 858 (90 BASE) MCG/ACT AEPB Inhale 2 puffs into the lungs every 6 (six) hours as needed. 1 each 5  . ALPRAZolam (XANAX) 0.25 MG tablet Take 0.25 mg by mouth 2 (two) times daily as needed for anxiety.     Marland Kitchen aspirin EC 81 MG EC tablet Take 1 tablet (81 mg total) by mouth daily. 30 tablet 1  . bevacizumab (AVASTIN) 1.25 mg/0.1 mL SOLN 1.25 mg by Intravitreal route every 30 (thirty) days.    . budesonide-formoterol (SYMBICORT) 160-4.5 MCG/ACT inhaler Inhale 2 puffs into the lungs 2 (two) times daily.    . citalopram (CELEXA) 40 MG tablet Take 20 mg by mouth daily.   11  . furosemide (LASIX) 20 MG tablet 20 mg 3 (three) times a week. Mon Wed Fri  3  . HYDROmorphone (DILAUDID) 2 MG tablet Take 1 tablet (2 mg total) by mouth every 6 (six) hours as needed for severe pain. 360 tablet 0  . ipratropium (ATROVENT) 0.03 % nasal spray Place 2 sprays into both nostrils 3 (three) times daily as needed for rhinitis. 30 mL 12  . isosorbide mononitrate (IMDUR) 30 MG 24 hr tablet Take 30 mg by mouth daily.    Marland Kitchen levalbuterol (XOPENEX) 0.63 MG/3ML nebulizer  solution Take 0.63 mg by nebulization 2 (two) times daily.     . naproxen (NAPROSYN) 500 MG tablet Take 500 mg by mouth 2 (two) times daily with a meal.   1  . omeprazole (PRILOSEC) 20 MG capsule Take 40 mg by mouth daily.     . predniSONE (DELTASONE) 10 MG tablet Take 40 mg by mouth daily.    . prochlorperazine (COMPAZINE) 10 MG tablet Take 1 tablet (10 mg total) by mouth every 6 (six) hours as needed for nausea or vomiting. 30 tablet 1  . simvastatin (ZOCOR) 40 MG tablet Take 40 mg by mouth every Monday, Wednesday, and Friday.      No current facility-administered medications for this visit.    Allergies:    Allergies  Allergen Reactions  . Hydrocodone-Acetaminophen     REACTION: Itch  . Tramadol     Pt states  will not take med b/c it does not work   . Zolpidem Tartrate Other (See Comments)    nightmares    Social History:  The patient  reports that he quit smoking about 13 months ago. His smoking use included Cigarettes. He has a 26.5 pack-year smoking history. He has never used smokeless tobacco. He reports that he drinks alcohol. He reports that he does not use illicit drugs.   Family History  Problem Relation Age of Onset  . Cancer Father     multiple myeloma  . COPD Sister     emphysema- smoked  . Cancer Brother     multiple myeloma    ROS:  Please see the history of present illness.  Main complaint is wheezing, shortness of breath. No fevers. Denies any significant angina. All other systems reviewed and negative.   PHYSICAL EXAM: VS:  BP 118/74 mmHg  Pulse 104  Ht 5' 6.5" (1.689 m)  Wt 187 lb (84.823 kg)  BMI 29.73 kg/m2 Well nourished, well developed, in no acute distress HEENT: normal, [place]/AT, EOMI Neck: no JVD, normal carotid upstroke, no bruit Cardiac:  normal S1, S2; RRR; no murmur Lungs:  Loud audible wheezing with decreased air movement,no  rhonchi or rales Abd: soft, nontender, no hepatomegaly, no bruits Ext: Trace edema, 2+ distal pulses excellent radial pulse Skin: warm and dryBruising noted GU: deferred Neuro: no focal abnormalities noted, AAO x 3  EKG:  11/17/14-sinus tachycardia rate 104 with inferior infarct pattern. Nonspecific ST-T wave changes. No significant change - 01/27/14-sinus tachycardia rate 120.  ASSESSMENT AND PLAN:  1. Acute shortness of breath, possible acute systolic heart failure-loud wheezing heard audibly without stethoscope. Return of lung cancer. Inspiratory, expiratory. Ejection fraction 35%, oxygen saturation 98%. We will trial Lasix 40 mg twice a day with 20 mEq of potassium twice a day. He currently has been taking Lasix 20 mg Monday Wednesday and Friday. Creatinine is 1.3. Hopefully this will help with his shortness of breath. NYHA 4. Dr.  [name] believes that his symptoms are more on the scale of cardiogenic. We will try our best to relieve cardiogenic edema.  2. CAD-continued exertional anginal symptoms with mild to moderate exertion and previous RCA/LAD stent. 3. CAD-as above. 4. COPD-[name]. Maximized therapy. He does not qualify for oxygen. 5. Hypertension-currently reasonably controlled. 6. Chronic kidney disease stage III-creatinine currently 1.3.  7. We will see back on Thursday.  Signed, Candee Furbish, MD [facility]  11/17/2014 10:04 AM

## 2014-11-20 ENCOUNTER — Encounter: Payer: Self-pay | Admitting: Cardiology

## 2014-11-20 ENCOUNTER — Ambulatory Visit (INDEPENDENT_AMBULATORY_CARE_PROVIDER_SITE_OTHER): Payer: 59 | Admitting: Cardiology

## 2014-11-20 VITALS — BP 124/78 | HR 118 | Ht 66.5 in | Wt 188.0 lb

## 2014-11-20 DIAGNOSIS — I5041 Acute combined systolic (congestive) and diastolic (congestive) heart failure: Secondary | ICD-10-CM

## 2014-11-20 DIAGNOSIS — J438 Other emphysema: Secondary | ICD-10-CM

## 2014-11-20 DIAGNOSIS — C3431 Malignant neoplasm of lower lobe, right bronchus or lung: Secondary | ICD-10-CM

## 2014-11-20 DIAGNOSIS — Z79899 Other long term (current) drug therapy: Secondary | ICD-10-CM

## 2014-11-20 DIAGNOSIS — I255 Ischemic cardiomyopathy: Secondary | ICD-10-CM

## 2014-11-20 DIAGNOSIS — I2583 Coronary atherosclerosis due to lipid rich plaque: Secondary | ICD-10-CM

## 2014-11-20 DIAGNOSIS — I208 Other forms of angina pectoris: Secondary | ICD-10-CM

## 2014-11-20 DIAGNOSIS — I2510 Atherosclerotic heart disease of native coronary artery without angina pectoris: Secondary | ICD-10-CM

## 2014-11-20 LAB — BASIC METABOLIC PANEL
BUN: 39 mg/dL — AB (ref 6–23)
CALCIUM: 7.6 mg/dL — AB (ref 8.4–10.5)
CO2: 19 mEq/L (ref 19–32)
Chloride: 105 mEq/L (ref 96–112)
Creatinine, Ser: 1.6 mg/dL — ABNORMAL HIGH (ref 0.4–1.5)
GFR: 46.51 mL/min — AB (ref >60.00)
Glucose, Bld: 187 mg/dL — ABNORMAL HIGH (ref 70–99)
Potassium: 4.7 mEq/L (ref 3.5–5.1)
SODIUM: 136 meq/L (ref 135–145)

## 2014-11-20 MED ORDER — POTASSIUM CHLORIDE CRYS ER 20 MEQ PO TBCR: 20.0000 meq | EXTENDED_RELEASE_TABLET | Freq: Once | ORAL | Status: DC

## 2014-11-20 MED ORDER — FUROSEMIDE 40 MG PO TABS: 40.0000 mg | ORAL_TABLET | Freq: Every day | ORAL | Status: DC

## 2014-11-20 NOTE — Patient Instructions (Addendum)
Please take Furosemide 40 mg once a day. Take Potassium Chloride 20 MEQ daily. Continue all other medications as listed.  Please have blood work today.  (BMP)  Follow up in 1 month as scheduled.

## 2014-11-20 NOTE — Progress Notes (Signed)
[facility]. [address], [address] [place], [place]  [address] Phone: [phone] Fax:  [phone]  Date:  11/20/2014   ID:  Todd, Villanueva [birth year], MRN [id number]  PCP:  [name], MD   History of Present Illness: Todd Villanueva is a 73 y.o. male with stage III lung cancer, COPD, with ischemic cardiomyopathy ejection fraction 58-09%, grade 1 diastolic dysfunction here for follow-up of significant shortness of breath area  I previously discussed with Dr. [name]  who talked with [name], who checked oxygen saturations which were normal. He was thinking that his current symptoms were more cardiac related. He is struggling at rest. Since being on Lasix increased dose, he does feel slightly better. He also states that when he coughs vigorously and is able to bring up white mucus he feels a tremendous amount of relief for short period of time. Very short of breath. Dr. [name] recently increased his steroid. Previously he had stent placement in 2011 RCA and LAD. Prior NUC stress 5/14 low risk with no ischemia.   He also had prior rectus sheath hematoma.   Creat 1.3 currently but at one point he did develop creatinine of 2.7. We will monitor renal function.    Wt Readings from Last 3 Encounters:  11/20/14 188 lb (85.276 kg)  11/17/14 187 lb (84.823 kg)  11/14/14 186 lb 6.4 oz (84.55 kg)     Past Medical History  Diagnosis Date  . Hypertension   . lung ca dx'd 02/2011    xrt/chemo comp 03/30/11, stage IIIA  . Hyperlipidemia   . 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 History of angina   . CAD (coronary artery disease)     Past Surgical History  Procedure Laterality Date  . Right vats, rt thoracotomy, rt middle and rt lower  lobectomy  06/10/2011    Dr Arlyce Dice  . Knee arthroscopy    . Tonsillectomy    . Coronary stent placement  JULY,AUGUST 2011  . Orif femoral neck fracture w/ dhs    . Left  heart catheterization with coronary angiogram N/A 06/17/2014    Procedure: LEFT HEART CATHETERIZATION WITH CORONARY ANGIOGRAM;  Surgeon: Candee Furbish, MD;  Location: [facility] CATH LAB;  Service: Cardiovascular;  Laterality: N/A;    Current Outpatient Prescriptions  Medication Sig Dispense Refill  . Albuterol Sulfate (PROAIR RESPICLICK) 983 (90 BASE) MCG/ACT AEPB Inhale 2 puffs into the lungs every 6 (six) hours as needed. 1 each 5  . ALPRAZolam (XANAX) 0.25 MG tablet Take 0.25 mg by mouth 2 (two) times daily as needed for anxiety.     Marland Kitchen aspirin EC 81 MG EC tablet Take 1 tablet (81 mg total) by mouth daily. 30 tablet 1  . bevacizumab (AVASTIN) 1.25 mg/0.1 mL SOLN 1.25 mg by Intravitreal route every 30 (thirty) days.    . budesonide-formoterol (SYMBICORT) 160-4.5 MCG/ACT inhaler Inhale 2 puffs into the lungs 2 (two) times daily.    . citalopram (CELEXA) 40 MG tablet Take 20 mg by mouth daily.   11  . furosemide (LASIX) 20 MG tablet 20 mg 3 (three) times a week. Mon Wed Fri  3  . furosemide (LASIX) 40 MG tablet Take as directed for the next 3 days. 30 tablet 3  . HYDROmorphone (DILAUDID) 2 MG tablet Take 1 tablet (2 mg total) by mouth every 6 (six) hours as needed  for severe pain. 360 tablet 0  . ipratropium (ATROVENT) 0.03 % nasal spray Place 2 sprays into both nostrils 3 (three) times daily as needed for rhinitis. 30 mL 12  . isosorbide mononitrate (IMDUR) 30 MG 24 hr tablet Take 30 mg by mouth daily.    Marland Kitchen levalbuterol (XOPENEX) 0.63 MG/3ML nebulizer solution Take 0.63 mg by nebulization 2 (two) times daily.     . naproxen (NAPROSYN) 500 MG tablet Take 500 mg by mouth 2 (two) times daily with a meal.   1  . omeprazole (PRILOSEC) 20 MG capsule Take 40 mg by mouth daily.     . potassium chloride SA (K-DUR,KLOR-CON) 20 MEQ tablet Take as directed for the next 3 days 30 tablet 3  . predniSONE (DELTASONE) 10 MG tablet Take 40 mg by mouth daily.    . prochlorperazine (COMPAZINE) 10 MG tablet Take 1 tablet (10  mg total) by mouth every 6 (six) hours as needed for nausea or vomiting. 30 tablet 1  . simvastatin (ZOCOR) 40 MG tablet Take 40 mg by mouth every Monday, Wednesday, and Friday.      No current facility-administered medications for this visit.    Allergies:    Allergies  Allergen Reactions  . Hydrocodone-Acetaminophen     REACTION: Itch  . Tramadol     Pt states will not take med b/c it does not work   . Zolpidem Tartrate Other (See Comments)    nightmares    Social History:  The patient  reports that he quit smoking about 13 months ago. His smoking use included Cigarettes. He has a 26.5 pack-year smoking history. He has never used smokeless tobacco. He reports that he drinks alcohol. He reports that he does not use illicit drugs.   Family History  Problem Relation Age of Onset  . Cancer Father     multiple myeloma  . COPD Sister     emphysema- smoked  . Cancer Brother     multiple myeloma    ROS:  Please see the history of present illness.  Main complaint is wheezing, shortness of breath. No fevers. Denies any significant angina. All other systems reviewed and negative.   PHYSICAL EXAM: VS:  BP 124/78 mmHg  Pulse 118  Ht 5' 6.5" (1.689 m)  Wt 188 lb (85.276 kg)  BMI 29.89 kg/m2 Well nourished, well developed, in no acute distress HEENT: normal, [place]/AT, EOMI Neck: no JVD, normal carotid upstroke, no bruit Cardiac:  normal S1, S2; tachycardic regular; no murmur Lungs:  Loud audible wheezing with decreased air movement,no  rhonchi or rales Abd: soft, nontender, no hepatomegaly, no bruits Ext: Trace edema, 2+ distal pulses excellent radial pulse Skin: warm and dryBruising noted GU: deferred Neuro: no focal abnormalities noted, AAO x 3  EKG:  11/17/14-sinus tachycardia rate 104 with inferior infarct pattern. Nonspecific ST-T wave changes. No significant change - 01/27/14-sinus tachycardia rate 120.  ASSESSMENT AND PLAN:  1. Acute shortness of breath, possible acute  systolic heart failure-loud wheezing heard audibly without stethoscope. Return of lung cancer. Inspiratory, expiratory. Ejection fraction 35%, oxygen saturation 98%. We will go ahead and continue with Lasix 40 mg once a day as well as potassium 20 mEq once a day. Previously was taking Lasix 20 mg Monday Wednesday and Friday. We will check basic metabolic profile today. Creatinine was 1.3. Hopefully this will continue to help with his shortness of breath. NYHA 4. We will try our best to relieve any possible cardiogenic edema.  2. CAD-continued exertional  anginal symptoms with mild to moderate exertion and previous RCA/LAD stent. 3. CAD-as above. 4. COPD-[name]. Maximized therapy. He does not qualify for oxygen.  5. Hypertension-currently reasonably controlled. 6. Chronic kidney disease stage III-creatinine currently 1.3.  7. One month follow-up.  Signed, Candee Furbish, MD [facility]  11/20/2014 1:39 PM

## 2014-11-20 NOTE — Progress Notes (Signed)
FAXED [place], [place] HER PHONE NUMBER IS [phone] EXTENSION [phone].

## 2014-11-24 ENCOUNTER — Encounter: Payer: Self-pay | Admitting: Cardiology

## 2014-11-24 ENCOUNTER — Telehealth: Payer: Self-pay | Admitting: Internal Medicine

## 2014-11-24 NOTE — Telephone Encounter (Signed)
returned pt call and lvm advised that we do not have so tx time.Marland KitchenMarland KitchenMarland Kitchen

## 2014-11-25 ENCOUNTER — Telehealth: Payer: Self-pay | Admitting: Cardiology

## 2014-11-25 ENCOUNTER — Encounter (INDEPENDENT_AMBULATORY_CARE_PROVIDER_SITE_OTHER): Payer: 59 | Admitting: Ophthalmology

## 2014-11-25 DIAGNOSIS — H43813 Vitreous degeneration, bilateral: Secondary | ICD-10-CM

## 2014-11-25 DIAGNOSIS — H34811 Central retinal vein occlusion, right eye: Secondary | ICD-10-CM

## 2014-11-25 DIAGNOSIS — I10 Essential (primary) hypertension: Secondary | ICD-10-CM

## 2014-11-25 DIAGNOSIS — H35033 Hypertensive retinopathy, bilateral: Secondary | ICD-10-CM

## 2014-11-25 NOTE — Telephone Encounter (Signed)
Advised pt OK to continue and finish up with what he has.  Will forward to Dr Marlou Porch to determine how long pt can continue to take Mucinex.  He is aware Dr Marlou Porch is not in the office this week.

## 2014-11-25 NOTE — Telephone Encounter (Signed)
New Msg     Pt calling, has been taking Mucinex and has almost completed box.  Is it safe to continue taking the mucinex? Please call and advise. [phone].

## 2014-11-26 ENCOUNTER — Encounter (INDEPENDENT_AMBULATORY_CARE_PROVIDER_SITE_OTHER): Payer: 59 | Admitting: Ophthalmology

## 2014-11-26 ENCOUNTER — Encounter: Payer: Self-pay | Admitting: Internal Medicine

## 2014-11-26 ENCOUNTER — Encounter: Payer: Self-pay | Admitting: Pulmonary Disease

## 2014-11-26 ENCOUNTER — Other Ambulatory Visit: Payer: Self-pay | Admitting: Medical Oncology

## 2014-11-26 ENCOUNTER — Telehealth: Payer: Self-pay | Admitting: Medical Oncology

## 2014-11-26 ENCOUNTER — Ambulatory Visit: Payer: 59

## 2014-11-26 ENCOUNTER — Ambulatory Visit (HOSPITAL_BASED_OUTPATIENT_CLINIC_OR_DEPARTMENT_OTHER): Payer: 59 | Admitting: Internal Medicine

## 2014-11-26 ENCOUNTER — Ambulatory Visit (HOSPITAL_BASED_OUTPATIENT_CLINIC_OR_DEPARTMENT_OTHER): Payer: 59 | Admitting: Lab

## 2014-11-26 VITALS — BP 138/75 | HR 112 | Temp 98.1°F | Resp 19 | Ht 66.5 in | Wt 183.7 lb

## 2014-11-26 DIAGNOSIS — C3431 Malignant neoplasm of lower lobe, right bronchus or lung: Secondary | ICD-10-CM

## 2014-11-26 DIAGNOSIS — C7951 Secondary malignant neoplasm of bone: Secondary | ICD-10-CM

## 2014-11-26 LAB — CBC WITH DIFFERENTIAL/PLATELET
BASO%: 0.1 % (ref 0.0–2.0)
Basophils Absolute: 0 10*3/uL (ref 0.0–0.1)
EOS%: 0 % (ref 0.0–7.0)
Eosinophils Absolute: 0 10*3/uL (ref 0.0–0.5)
HCT: 26.6 % — ABNORMAL LOW (ref 38.4–49.9)
HGB: 8.9 g/dL — ABNORMAL LOW (ref 13.0–17.1)
LYMPH%: 8.4 % — ABNORMAL LOW (ref 14.0–49.0)
MCH: 33.7 pg — AB (ref 27.2–33.4)
MCHC: 33.5 g/dL (ref 32.0–36.0)
MCV: 100.8 fL — AB (ref 79.3–98.0)
MONO#: 0.8 10*3/uL (ref 0.1–0.9)
MONO%: 8.7 % (ref 0.0–14.0)
NEUT#: 7.1 10*3/uL — ABNORMAL HIGH (ref 1.5–6.5)
NEUT%: 82.8 % — ABNORMAL HIGH (ref 39.0–75.0)
Platelets: 83 10*3/uL — ABNORMAL LOW (ref 140–400)
RBC: 2.64 10*6/uL — ABNORMAL LOW (ref 4.20–5.82)
RDW: 17.7 % — AB (ref 11.0–14.6)
WBC: 8.6 10*3/uL (ref 4.0–10.3)
lymph#: 0.7 10*3/uL — ABNORMAL LOW (ref 0.9–3.3)

## 2014-11-26 LAB — COMPREHENSIVE METABOLIC PANEL (CC13)
ALK PHOS: 145 U/L (ref 40–150)
ALT: 35 U/L (ref 0–55)
AST: 22 U/L (ref 5–34)
Albumin: 3.6 g/dL (ref 3.5–5.0)
Anion Gap: 13 mEq/L — ABNORMAL HIGH (ref 3–11)
BILIRUBIN TOTAL: 0.48 mg/dL (ref 0.20–1.20)
BUN: 49.1 mg/dL — AB (ref 7.0–26.0)
CO2: 23 mEq/L (ref 22–29)
CREATININE: 1.7 mg/dL — AB (ref 0.7–1.3)
Calcium: 8.3 mg/dL — ABNORMAL LOW (ref 8.4–10.4)
Chloride: 103 mEq/L (ref 98–109)
EGFR: 39 mL/min/{1.73_m2} — AB (ref >90)
GLUCOSE: 105 mg/dL (ref 70–140)
Potassium: 4.1 mEq/L (ref 3.5–5.1)
Sodium: 139 mEq/L (ref 136–145)
Total Protein: 6.5 g/dL (ref 6.4–8.3)

## 2014-11-26 NOTE — Progress Notes (Signed)
[facility] Telephone:([phone]) [phone]   Fax:(336) [phone]  OFFICE PROGRESS NOTE  [name], MD [address] [place] [address]  DIAGNOSIS: Metastatic non-small cell lung cancer initially diagnosed as Stage IIB/IIIA non-small cell lung cancer, squamous cell carcinoma diagnosed in February of 2012.   PRIOR THERAPY:  1) Status post a course of concurrent chemoradiation with weekly carboplatin and paclitaxel. Last dose of chemotherapy was given on 03/21/2011.  2) status post right VATS, right thoracotomy, right middle and right lower lobectomies under the care of [name] on 06/10/2011.   CURRENT THERAPY: Systemic chemotherapy was carboplatin for AUC of 5 and paclitaxel 175 MG/M2 every 3 weeks with Neulasta support. First cycle on 10/14/2014.  INTERVAL HISTORY: Todd Villanueva 73 y.o. male returns to the clinic today for followup visit. The patient continues to complain of increasing shortness of breath especially with exertion as well as wheezes. He is followed by [name] for his COPD. He is currently on Symbicort and Brewer inhaler as well as Xopenex and a taper dose of prednisone, currently 40 mg by mouth daily. He denied having any significant cough or hemoptysis. The patient denied having any significant weight loss or night sweats. He has no nausea or vomiting. He tolerated the first cycle of his systemic chemotherapy was carboplatin and paclitaxel fairly well except for fatigue and mild numbness in his fingers. He denied having any significant nausea or vomiting.  MEDICAL HISTORY: Past Medical History  Diagnosis Date  . Hypertension   . lung ca dx'd 02/2011    xrt/chemo comp 03/30/11, stage IIIA  . Hyperlipidemia   . 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 History of angina   . CAD (coronary artery disease)     ALLERGIES:  is allergic to hydrocodone-acetaminophen;  tramadol; and zolpidem tartrate.  MEDICATIONS:  Current Outpatient Prescriptions  Medication Sig Dispense Refill  . Albuterol Sulfate (PROAIR RESPICLICK) 937 (90 BASE) MCG/ACT AEPB Inhale 2 puffs into the lungs every 6 (six) hours as needed. 1 each 5  . ALPRAZolam (XANAX) 0.25 MG tablet Take 0.25 mg by mouth 2 (two) times daily as needed for anxiety.     Marland Kitchen aspirin EC 81 MG EC tablet Take 1 tablet (81 mg total) by mouth daily. 30 tablet 1  . bevacizumab (AVASTIN) 1.25 mg/0.1 mL SOLN 1.25 mg by Intravitreal route every 30 (thirty) days.    . budesonide-formoterol (SYMBICORT) 160-4.5 MCG/ACT inhaler Inhale 2 puffs into the lungs 2 (two) times daily.    . citalopram (CELEXA) 40 MG tablet Take 20 mg by mouth daily.   11  . furosemide (LASIX) 40 MG tablet Take 1 tablet (40 mg total) by mouth daily. Take as directed for the next 3 days. 30 tablet 3  . GuaiFENesin (MUCINEX PO) Take 600 mg by mouth 2 (two) times daily.    Marland Kitchen HYDROmorphone (DILAUDID) 2 MG tablet Take 1 tablet (2 mg total) by mouth every 6 (six) hours as needed for severe pain. 360 tablet 0  . ipratropium (ATROVENT) 0.03 % nasal spray Place 2 sprays into both nostrils 3 (three) times daily as needed for rhinitis. 30 mL 12  . isosorbide mononitrate (IMDUR) 30 MG 24 hr tablet Take 30 mg by mouth daily.    Marland Kitchen levalbuterol (XOPENEX) 0.63 MG/3ML nebulizer solution Take 0.63 mg by nebulization 2 (two) times daily.     Marland Kitchen  naproxen (NAPROSYN) 500 MG tablet Take 500 mg by mouth 2 (two) times daily with a meal.   1  . omeprazole (PRILOSEC) 20 MG capsule Take 40 mg by mouth daily.     . potassium chloride SA (K-DUR,KLOR-CON) 20 MEQ tablet Take 1 tablet (20 mEq total) by mouth once. Take as directed for the next 3 days 30 tablet 3  . predniSONE (DELTASONE) 10 MG tablet Take 40 mg by mouth daily.    . prochlorperazine (COMPAZINE) 10 MG tablet Take 1 tablet (10 mg total) by mouth every 6 (six) hours as needed for nausea or vomiting. 30 tablet 1  .  simvastatin (ZOCOR) 40 MG tablet Take 40 mg by mouth every Monday, Wednesday, and Friday.      No current facility-administered medications for this visit.    SURGICAL HISTORY:  Past Surgical History  Procedure Laterality Date  . Right vats, rt thoracotomy, rt middle and rt lower  lobectomy  06/10/2011    Dr Arlyce Dice  . Knee arthroscopy    . Tonsillectomy    . Coronary stent placement  JULY,AUGUST 2011  . Orif femoral neck fracture w/ dhs    . Left heart catheterization with coronary angiogram N/A 06/17/2014    Procedure: LEFT HEART CATHETERIZATION WITH CORONARY ANGIOGRAM;  Surgeon: Candee Furbish, MD;  Location: [facility] CATH LAB;  Service: Cardiovascular;  Laterality: N/A;    REVIEW OF SYSTEMS:  A comprehensive review of systems was negative except for: Constitutional: positive for fatigue Respiratory: positive for dyspnea on exertion and wheezing   PHYSICAL EXAMINATION: General appearance: alert, cooperative and no distress Head: Normocephalic, without obvious abnormality, atraumatic Neck: no adenopathy Lymph nodes: Cervical, supraclavicular, and axillary nodes normal. Resp: wheezes bilaterally Back: symmetric, no curvature. ROM normal. No CVA tenderness. Cardio: regular rate and rhythm, S1, S2 normal, no murmur, click, rub or gallop GI: soft, non-tender; bowel sounds normal; no masses,  no organomegaly Extremities: extremities normal, atraumatic, no cyanosis or edema Neurologic: Alert and oriented X 3, normal strength and tone. Normal symmetric reflexes. Normal coordination and gait  ECOG PERFORMANCE STATUS: 1 - Symptomatic but completely ambulatory  Blood pressure 138/75, pulse 112, temperature 98.1 F (36.7 C), temperature source Oral, resp. rate 19, height 5' 6.5" (1.689 m), weight 183 lb 11.2 oz (83.326 kg), SpO2 97 %.  LABORATORY DATA: Lab Results  Component Value Date   WBC 8.6 11/26/2014   HGB 8.9* 11/26/2014   HCT 26.6* 11/26/2014   MCV 100.8* 11/26/2014   PLT 83*  11/26/2014      Chemistry      Component Value Date/Time   NA 139 11/26/2014 1015   NA 136 11/20/2014 1403   NA 142 01/10/2012 1205   K 4.1 11/26/2014 1015   K 4.7 11/20/2014 1403   K 3.9 01/10/2012 1205   CL 105 11/20/2014 1403   CL 107 10/15/2012 1521   CL 102 01/10/2012 1205   CO2 23 11/26/2014 1015   CO2 19 11/20/2014 1403   CO2 26 01/10/2012 1205   BUN 49.1* 11/26/2014 1015   BUN 39* 11/20/2014 1403   BUN 27* 01/10/2012 1205   CREATININE 1.7* 11/26/2014 1015   CREATININE 1.6* 11/20/2014 1403   CREATININE 1.1 01/10/2012 1205      Component Value Date/Time   CALCIUM 8.3* 11/26/2014 1015   CALCIUM 7.6* 11/20/2014 1403   CALCIUM 8.8 01/10/2012 1205   ALKPHOS 145 11/26/2014 1015   ALKPHOS 156* 01/28/2014 0510   ALKPHOS 271* 01/10/2012 1205   AST  22 11/26/2014 1015   AST 25 01/28/2014 0510   AST 28 01/10/2012 1205   ALT 35 11/26/2014 1015   ALT 26 01/28/2014 0510   ALT 28 01/10/2012 1205   BILITOT 0.48 11/26/2014 1015   BILITOT 0.4 01/28/2014 0510   BILITOT 0.60 01/10/2012 1205       RADIOGRAPHIC STUDIES: No results found.  ASSESSMENT AND PLAN: This is a very pleasant 73 years old white male with metastatic non-small cell lung cancer initially diagnosed as stage IIB/IIIa non-small cell lung cancer status post concurrent chemoradiation followed by right middle and right lower lobectomies and has been observation since July of 2002. He is currently on systemic chemotherapy with carboplatin and paclitaxel is status post 2 cycles and tolerating the treatment well except for fatigue and mild peripheral neuropathy. Unfortunately his platelets count are low today. I recommended for the patient to delay the start of cycle #4 x 1 week to give him more time for the platelets count to recover. He is expected to start cycle #3 next week. I would see him back for follow-up visit in 4 weeks after repeating CT scan of the chest, abdomen and pelvis for restaging of his disease. For  the history of COPD, I recommended for the patient to continue his current treatment with prednisone, albuterol, Symbicort as well as Xopenex. For the metastatic bone disease, the patient will continue on Xgeva injection every 4 weeks. He was advised to take calcium and vitamin D supplements on daily basis.  He was advised to call immediately if he has any concerning symptoms in the interval. The patient voices understanding of current disease status and treatment options and is in agreement with the current care plan.  All questions were answered. The patient knows to call the clinic with any problems, questions or concerns. We can certainly see the patient much sooner if necessary.  Disclaimer: This note was dictated with voice recognition software. Similar sounding words can inadvertently be transcribed and may not be corrected upon review.

## 2014-11-26 NOTE — Telephone Encounter (Signed)
Per Dr Julien Nordmann it is okay to do Ct abd.pelvis without contrast.Called to [place].

## 2014-11-27 ENCOUNTER — Ambulatory Visit: Payer: 59

## 2014-12-01 ENCOUNTER — Encounter: Payer: Self-pay | Admitting: Internal Medicine

## 2014-12-02 ENCOUNTER — Other Ambulatory Visit (HOSPITAL_BASED_OUTPATIENT_CLINIC_OR_DEPARTMENT_OTHER): Payer: 59

## 2014-12-02 DIAGNOSIS — C3431 Malignant neoplasm of lower lobe, right bronchus or lung: Secondary | ICD-10-CM

## 2014-12-02 DIAGNOSIS — C7B8 Other secondary neuroendocrine tumors: Secondary | ICD-10-CM

## 2014-12-02 DIAGNOSIS — C7A1 Malignant poorly differentiated neuroendocrine tumors: Secondary | ICD-10-CM

## 2014-12-02 LAB — CBC WITH DIFFERENTIAL/PLATELET
BASO%: 0.1 % (ref 0.0–2.0)
BASOS ABS: 0 10*3/uL (ref 0.0–0.1)
EOS ABS: 0 10*3/uL (ref 0.0–0.5)
EOS%: 0.2 % (ref 0.0–7.0)
HCT: 26.9 % — ABNORMAL LOW (ref 38.4–49.9)
HGB: 8.9 g/dL — ABNORMAL LOW (ref 13.0–17.1)
LYMPH%: 5.2 % — ABNORMAL LOW (ref 14.0–49.0)
MCH: 34.1 pg — AB (ref 27.2–33.4)
MCHC: 33.1 g/dL (ref 32.0–36.0)
MCV: 103.1 fL — AB (ref 79.3–98.0)
MONO#: 0.5 10*3/uL (ref 0.1–0.9)
MONO%: 5.7 % (ref 0.0–14.0)
NEUT#: 8.1 10*3/uL — ABNORMAL HIGH (ref 1.5–6.5)
NEUT%: 88.8 % — AB (ref 39.0–75.0)
Platelets: 181 10*3/uL (ref 140–400)
RBC: 2.61 10*6/uL — ABNORMAL LOW (ref 4.20–5.82)
RDW: 19.2 % — AB (ref 11.0–14.6)
WBC: 9.1 10*3/uL (ref 4.0–10.3)
lymph#: 0.5 10*3/uL — ABNORMAL LOW (ref 0.9–3.3)

## 2014-12-02 LAB — COMPREHENSIVE METABOLIC PANEL (CC13)
ALT: 33 U/L (ref 0–55)
AST: 24 U/L (ref 5–34)
Albumin: 3.6 g/dL (ref 3.5–5.0)
Alkaline Phosphatase: 133 U/L (ref 40–150)
Anion Gap: 12 mEq/L — ABNORMAL HIGH (ref 3–11)
BILIRUBIN TOTAL: 0.46 mg/dL (ref 0.20–1.20)
BUN: 41.5 mg/dL — ABNORMAL HIGH (ref 7.0–26.0)
CO2: 26 mEq/L (ref 22–29)
Calcium: 7.7 mg/dL — ABNORMAL LOW (ref 8.4–10.4)
Chloride: 100 mEq/L (ref 98–109)
Creatinine: 1.5 mg/dL — ABNORMAL HIGH (ref 0.7–1.3)
EGFR: 45 mL/min/{1.73_m2} — ABNORMAL LOW (ref >90)
Glucose: 94 mg/dl (ref 70–140)
Potassium: 4 mEq/L (ref 3.5–5.1)
SODIUM: 137 meq/L (ref 136–145)
Total Protein: 6.3 g/dL — ABNORMAL LOW (ref 6.4–8.3)

## 2014-12-03 ENCOUNTER — Ambulatory Visit (HOSPITAL_BASED_OUTPATIENT_CLINIC_OR_DEPARTMENT_OTHER): Payer: 59

## 2014-12-03 DIAGNOSIS — C3431 Malignant neoplasm of lower lobe, right bronchus or lung: Secondary | ICD-10-CM

## 2014-12-03 DIAGNOSIS — C7B8 Other secondary neuroendocrine tumors: Secondary | ICD-10-CM

## 2014-12-03 DIAGNOSIS — Z5111 Encounter for antineoplastic chemotherapy: Secondary | ICD-10-CM

## 2014-12-03 DIAGNOSIS — C7A1 Malignant poorly differentiated neuroendocrine tumors: Secondary | ICD-10-CM

## 2014-12-03 MED ORDER — DIPHENHYDRAMINE HCL 50 MG/ML IJ SOLN
INTRAMUSCULAR | Status: AC
  Filled 2014-12-03: qty 1

## 2014-12-03 MED ORDER — FAMOTIDINE IN NACL 20-0.9 MG/50ML-% IV SOLN
20.0000 mg | Freq: Once | INTRAVENOUS | Status: AC
  Administered 2014-12-03: 20 mg via INTRAVENOUS

## 2014-12-03 MED ORDER — SODIUM CHLORIDE 0.9 % IV SOLN
Freq: Once | INTRAVENOUS | Status: AC
  Administered 2014-12-03: 08:00:00 via INTRAVENOUS

## 2014-12-03 MED ORDER — PACLITAXEL CHEMO INJECTION 300 MG/50ML
175.0000 mg/m2 | Freq: Once | INTRAVENOUS | Status: AC
  Administered 2014-12-03: 348 mg via INTRAVENOUS
  Filled 2014-12-03: qty 58

## 2014-12-03 MED ORDER — DIPHENHYDRAMINE HCL 50 MG/ML IJ SOLN
50.0000 mg | Freq: Once | INTRAMUSCULAR | Status: AC
  Administered 2014-12-03: 50 mg via INTRAVENOUS

## 2014-12-03 MED ORDER — DEXAMETHASONE SODIUM PHOSPHATE 20 MG/5ML IJ SOLN
20.0000 mg | Freq: Once | INTRAMUSCULAR | Status: AC
  Administered 2014-12-03: 20 mg via INTRAVENOUS

## 2014-12-03 MED ORDER — FAMOTIDINE IN NACL 20-0.9 MG/50ML-% IV SOLN
INTRAVENOUS | Status: AC
  Filled 2014-12-03: qty 50

## 2014-12-03 MED ORDER — DEXAMETHASONE SODIUM PHOSPHATE 20 MG/5ML IJ SOLN
INTRAMUSCULAR | Status: AC
  Filled 2014-12-03: qty 5

## 2014-12-03 MED ORDER — SODIUM CHLORIDE 0.9 % IV SOLN
367.0000 mg | Freq: Once | INTRAVENOUS | Status: AC
  Administered 2014-12-03: 370 mg via INTRAVENOUS
  Filled 2014-12-03: qty 37

## 2014-12-03 MED ORDER — ONDANSETRON 16 MG/50ML IVPB (CHCC)
INTRAVENOUS | Status: AC
  Filled 2014-12-03: qty 16

## 2014-12-03 MED ORDER — ONDANSETRON 16 MG/50ML IVPB (CHCC)
16.0000 mg | Freq: Once | INTRAVENOUS | Status: AC
Start: 2014-12-03 — End: 2014-12-03
  Administered 2014-12-03: 16 mg via INTRAVENOUS

## 2014-12-03 NOTE — Telephone Encounter (Signed)
Dr Marlou Porch spoke with pt in hospital today.

## 2014-12-03 NOTE — Patient Instructions (Signed)
[facility] Discharge Instructions for Patients Receiving Chemotherapy  Today you received the following chemotherapy agents taxol/carboplatin  To help prevent nausea and vomiting after your treatment, we encourage you to take your nausea medication as directed   If you develop nausea and vomiting that is not controlled by your nausea medication, call the clinic.   BELOW ARE SYMPTOMS THAT SHOULD BE REPORTED IMMEDIATELY:  *FEVER GREATER THAN 100.5 F  *CHILLS WITH OR WITHOUT FEVER  NAUSEA AND VOMITING THAT IS NOT CONTROLLED WITH YOUR NAUSEA MEDICATION  *UNUSUAL SHORTNESS OF BREATH  *UNUSUAL BRUISING OR BLEEDING  TENDERNESS IN MOUTH AND THROAT WITH OR WITHOUT PRESENCE OF ULCERS  *URINARY PROBLEMS  *BOWEL PROBLEMS  UNUSUAL RASH Items with * indicate a potential emergency and should be followed up as soon as possible.  Feel free to call the clinic you have any questions or concerns. The clinic phone number is [phone].  

## 2014-12-04 ENCOUNTER — Ambulatory Visit (HOSPITAL_BASED_OUTPATIENT_CLINIC_OR_DEPARTMENT_OTHER): Payer: 59

## 2014-12-04 DIAGNOSIS — C7B8 Other secondary neuroendocrine tumors: Secondary | ICD-10-CM

## 2014-12-04 DIAGNOSIS — C7A1 Malignant poorly differentiated neuroendocrine tumors: Secondary | ICD-10-CM

## 2014-12-04 DIAGNOSIS — Z5189 Encounter for other specified aftercare: Secondary | ICD-10-CM

## 2014-12-04 DIAGNOSIS — C3431 Malignant neoplasm of lower lobe, right bronchus or lung: Secondary | ICD-10-CM

## 2014-12-04 MED ORDER — PEGFILGRASTIM INJECTION 6 MG/0.6ML ~~LOC~~
6.0000 mg | PREFILLED_SYRINGE | Freq: Once | SUBCUTANEOUS | Status: AC
  Administered 2014-12-04: 6 mg via SUBCUTANEOUS
  Filled 2014-12-04: qty 0.6

## 2014-12-04 NOTE — Patient Instructions (Signed)
Pegfilgrastim injection What is this medicine? PEGFILGRASTIM (peg fil GRA stim) is a long-acting granulocyte colony-stimulating factor that stimulates the growth of neutrophils, a type of white blood cell important in the body's fight against infection. It is used to reduce the incidence of fever and infection in patients with certain types of cancer who are receiving chemotherapy that affects the bone marrow. This medicine may be used for other purposes; ask your health care provider or pharmacist if you have questions. COMMON BRAND NAME(S): Neulasta What should I tell my health care provider before I take this medicine? They need to know if you have any of these conditions: -latex allergy -ongoing radiation therapy -sickle cell disease -skin reactions to acrylic adhesives (On-Body Injector only) -an unusual or allergic reaction to pegfilgrastim, filgrastim, other medicines, foods, dyes, or preservatives -pregnant or trying to get pregnant -breast-feeding How should I use this medicine? This medicine is for injection under the skin. If you get this medicine at home, you will be taught how to prepare and give the pre-filled syringe or how to use the On-body Injector. Refer to the patient Instructions for Use for detailed instructions. Use exactly as directed. Take your medicine at regular intervals. Do not take your medicine more often than directed. It is important that you put your used needles and syringes in a special sharps container. Do not put them in a trash can. If you do not have a sharps container, call your pharmacist or healthcare provider to get one. Talk to your pediatrician regarding the use of this medicine in children. Special care may be needed. Overdosage: If you think you have taken too much of this medicine contact a poison control center or emergency room at once. NOTE: This medicine is only for you. Do not share this medicine with others. What if I miss a dose? It is  important not to miss your dose. Call your doctor or health care professional if you miss your dose. If you miss a dose due to an On-body Injector failure or leakage, a new dose should be administered as soon as possible using a single prefilled syringe for manual use. What may interact with this medicine? Interactions have not been studied. Give your health care provider a list of all the medicines, herbs, non-prescription drugs, or dietary supplements you use. Also tell them if you smoke, drink alcohol, or use illegal drugs. Some items may interact with your medicine. This list may not describe all possible interactions. Give your health care provider a list of all the medicines, herbs, non-prescription drugs, or dietary supplements you use. Also tell them if you smoke, drink alcohol, or use illegal drugs. Some items may interact with your medicine. What should I watch for while using this medicine? You may need blood work done while you are taking this medicine. If you are going to need a MRI, CT scan, or other procedure, tell your doctor that you are using this medicine (On-Body Injector only). What side effects may I notice from receiving this medicine? Side effects that you should report to your doctor or health care professional as soon as possible: -allergic reactions like skin rash, itching or hives, swelling of the face, lips, or tongue -dizziness -fever -pain, redness, or irritation at site where injected -pinpoint red spots on the skin -shortness of breath or breathing problems -stomach or side pain, or pain at the shoulder -swelling -tiredness -trouble passing urine Side effects that usually do not require medical attention (report to your doctor   or health care professional if they continue or are bothersome): -bone pain -muscle pain This list may not describe all possible side effects. Call your doctor for medical advice about side effects. You may report side effects to FDA at  1-800-FDA-1088. Where should I keep my medicine? Keep out of the reach of children. Store pre-filled syringes in a refrigerator between 2 and 8 degrees C (36 and 46 degrees F). Do not freeze. Keep in carton to protect from light. Throw away this medicine if it is left out of the refrigerator for more than 48 hours. Throw away any unused medicine after the expiration date. NOTE: This sheet is a summary. It may not cover all possible information. If you have questions about this medicine, talk to your doctor, pharmacist, or health care provider.  2015, [employer]. (2014-02-20 16:14:05)  

## 2014-12-08 ENCOUNTER — Other Ambulatory Visit: Payer: Self-pay | Admitting: *Deleted

## 2014-12-08 MED ORDER — MAGIC MOUTHWASH W/LIDOCAINE: 5.0000 mL | Freq: Four times a day (QID) | ORAL | Status: DC | PRN

## 2014-12-10 ENCOUNTER — Telehealth: Payer: Self-pay | Admitting: Internal Medicine

## 2014-12-10 ENCOUNTER — Other Ambulatory Visit: Payer: 59

## 2014-12-10 ENCOUNTER — Ambulatory Visit: Payer: 59 | Admitting: Physician Assistant

## 2014-12-10 NOTE — Telephone Encounter (Signed)
pt called to cx appt due to not feeling well....r/s appt to nxt week....done....pt aware of new d.t

## 2014-12-17 ENCOUNTER — Other Ambulatory Visit: Payer: 59

## 2014-12-19 ENCOUNTER — Ambulatory Visit (HOSPITAL_COMMUNITY)
Admission: RE | Admit: 2014-12-19 | Discharge: 2014-12-19 | Disposition: A | Discharge status: Home / Self Care | Payer: 59 | Source: Ambulatory Visit | Attending: Internal Medicine | Admitting: Internal Medicine

## 2014-12-19 ENCOUNTER — Telehealth: Payer: Self-pay | Admitting: Internal Medicine

## 2014-12-19 ENCOUNTER — Encounter: Payer: Self-pay | Admitting: Physician Assistant

## 2014-12-19 ENCOUNTER — Other Ambulatory Visit: Payer: Self-pay | Admitting: *Deleted

## 2014-12-19 ENCOUNTER — Ambulatory Visit (HOSPITAL_BASED_OUTPATIENT_CLINIC_OR_DEPARTMENT_OTHER): Payer: 59

## 2014-12-19 ENCOUNTER — Other Ambulatory Visit: Payer: 59

## 2014-12-19 ENCOUNTER — Ambulatory Visit (HOSPITAL_BASED_OUTPATIENT_CLINIC_OR_DEPARTMENT_OTHER): Payer: Medicare Other | Admitting: Physician Assistant

## 2014-12-19 ENCOUNTER — Ambulatory Visit: Payer: 59

## 2014-12-19 VITALS — BP 150/80 | HR 99 | Temp 97.0°F | Resp 18

## 2014-12-19 VITALS — BP 132/64 | HR 108 | Temp 97.7°F | Resp 21 | Ht 66.5 in | Wt 185.5 lb

## 2014-12-19 DIAGNOSIS — T451X5A Adverse effect of antineoplastic and immunosuppressive drugs, initial encounter: Secondary | ICD-10-CM

## 2014-12-19 DIAGNOSIS — D6481 Anemia due to antineoplastic chemotherapy: Secondary | ICD-10-CM

## 2014-12-19 DIAGNOSIS — C3431 Malignant neoplasm of lower lobe, right bronchus or lung: Secondary | ICD-10-CM

## 2014-12-19 DIAGNOSIS — C3492 Malignant neoplasm of unspecified part of left bronchus or lung: Secondary | ICD-10-CM

## 2014-12-19 DIAGNOSIS — C7B8 Other secondary neuroendocrine tumors: Secondary | ICD-10-CM

## 2014-12-19 DIAGNOSIS — C7A1 Malignant poorly differentiated neuroendocrine tumors: Secondary | ICD-10-CM

## 2014-12-19 LAB — CBC WITH DIFFERENTIAL/PLATELET
BASO%: 0.2 % (ref 0.0–2.0)
Basophils Absolute: 0 10*3/uL (ref 0.0–0.1)
EOS%: 0.1 % (ref 0.0–7.0)
Eosinophils Absolute: 0 10*3/uL (ref 0.0–0.5)
HCT: 25.9 % — ABNORMAL LOW (ref 38.4–49.9)
HGB: 8.5 g/dL — ABNORMAL LOW (ref 13.0–17.1)
LYMPH%: 10.4 % — ABNORMAL LOW (ref 14.0–49.0)
MCH: 34.8 pg — ABNORMAL HIGH (ref 27.2–33.4)
MCHC: 32.8 g/dL (ref 32.0–36.0)
MCV: 106.1 fL — AB (ref 79.3–98.0)
MONO#: 0.6 10*3/uL (ref 0.1–0.9)
MONO%: 6.2 % (ref 0.0–14.0)
NEUT#: 8.3 10*3/uL — ABNORMAL HIGH (ref 1.5–6.5)
NEUT%: 83.1 % — ABNORMAL HIGH (ref 39.0–75.0)
Platelets: 52 10*3/uL — ABNORMAL LOW (ref 140–400)
RBC: 2.44 10*6/uL — AB (ref 4.20–5.82)
RDW: 19.8 % — AB (ref 11.0–14.6)
WBC: 10 10*3/uL (ref 4.0–10.3)
lymph#: 1 10*3/uL (ref 0.9–3.3)

## 2014-12-19 LAB — COMPREHENSIVE METABOLIC PANEL (CC13)
ALBUMIN: 3.5 g/dL (ref 3.5–5.0)
ALK PHOS: 183 U/L — AB (ref 40–150)
ALT: 37 U/L (ref 0–55)
AST: 22 U/L (ref 5–34)
Anion Gap: 10 mEq/L (ref 3–11)
BILIRUBIN TOTAL: 0.51 mg/dL (ref 0.20–1.20)
BUN: 46.4 mg/dL — ABNORMAL HIGH (ref 7.0–26.0)
CO2: 27 mEq/L (ref 22–29)
Calcium: 9 mg/dL (ref 8.4–10.4)
Chloride: 101 mEq/L (ref 98–109)
Creatinine: 1.7 mg/dL — ABNORMAL HIGH (ref 0.7–1.3)
EGFR: 41 mL/min/{1.73_m2} — AB (ref >90)
Glucose: 109 mg/dl (ref 70–140)
Potassium: 4 mEq/L (ref 3.5–5.1)
Sodium: 137 mEq/L (ref 136–145)
TOTAL PROTEIN: 6.4 g/dL (ref 6.4–8.3)

## 2014-12-19 LAB — PREPARE RBC (CROSSMATCH)

## 2014-12-19 LAB — ABO/RH: ABO/RH(D): O POS

## 2014-12-19 MED ORDER — ACETAMINOPHEN 325 MG PO TABS
ORAL_TABLET | ORAL | Status: AC
  Filled 2014-12-19: qty 2

## 2014-12-19 MED ORDER — DIPHENHYDRAMINE HCL 25 MG PO CAPS
ORAL_CAPSULE | ORAL | Status: AC
  Filled 2014-12-19: qty 1

## 2014-12-19 MED ORDER — SODIUM CHLORIDE 0.9 % IV SOLN
250.0000 mL | Freq: Once | INTRAVENOUS | Status: AC
  Administered 2014-12-19: 250 mL via INTRAVENOUS

## 2014-12-19 MED ORDER — DIPHENHYDRAMINE HCL 25 MG PO CAPS
25.0000 mg | ORAL_CAPSULE | Freq: Once | ORAL | Status: AC
  Administered 2014-12-19: 25 mg via ORAL

## 2014-12-19 MED ORDER — ACETAMINOPHEN 325 MG PO TABS
650.0000 mg | ORAL_TABLET | Freq: Once | ORAL | Status: AC
  Administered 2014-12-19: 650 mg via ORAL

## 2014-12-19 NOTE — Patient Instructions (Signed)

## 2014-12-19 NOTE — Telephone Encounter (Signed)
gv adn printed appt sched and avs for pt for Jan...sed added tx...Marland Kitchenper charge nurse pt sched for blood at 1pm

## 2014-12-19 NOTE — Progress Notes (Addendum)
[name] [phone]   [phone]  OFFICE PROGRESS NOTE  [name], MD [place] [address] [place] [address]  DIAGNOSIS: Metastatic non-small cell lung cancer initially diagnosed as Stage IIB/IIIA non-small cell lung cancer, squamous cell carcinoma diagnosed in February of 2012.   PRIOR THERAPY:  1) Status post a course of concurrent chemoradiation with weekly carboplatin and paclitaxel. Last dose of chemotherapy was given on 03/21/2011.  2) status post right VATS, right thoracotomy, right middle and right lower lobectomies under the care of [name] on 06/10/2011.   CURRENT THERAPY: Systemic chemotherapy was carboplatin for AUC of 5 and paclitaxel 175 MG/M2 every 3 weeks with Neulasta support. First cycle on 10/14/2014. Status post 3 cycles  INTERVAL HISTORY: Todd Villanueva 74 y.o. male returns to the clinic today for followup visit. The patient continues to complain of increasing shortness of breath especially with exertion as well as wheezes. He is followed by [name] for his COPD. Today he complains of increased fatigue. He denied having any significant cough or hemoptysis. The patient denied having any significant weight loss or night sweats. He has no nausea or vomiting. He is tolerating his systemic chemotherapy was carboplatin and paclitaxel fairly well except for fatigue and mild numbness in his fingers. He denied having any significant nausea or vomiting.  MEDICAL HISTORY: Past Medical History  Diagnosis Date  . Hypertension   . lung ca dx'd 02/2011    xrt/chemo comp 03/30/11, stage IIIA  . Hyperlipidemia   . 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 History of angina   . CAD (coronary artery disease)     ALLERGIES:  is allergic to hydrocodone-acetaminophen; tramadol; and zolpidem tartrate.  MEDICATIONS:  Current Outpatient Prescriptions   Medication Sig Dispense Refill  . ALPRAZolam (XANAX) 0.25 MG tablet Take 0.25 mg by mouth 2 (two) times daily as needed for anxiety.     . bevacizumab (AVASTIN) 1.25 mg/0.1 mL SOLN 1.25 mg by Intravitreal route every 30 (thirty) days.    . budesonide-formoterol (SYMBICORT) 160-4.5 MCG/ACT inhaler Inhale 2 puffs into the lungs 2 (two) times daily.    . Calcium Carbonate-Vitamin D (CALCIUM-VITAMIN D) 500-200 MG-UNIT per tablet Take 1 tablet by mouth 2 (two) times daily.    . citalopram (CELEXA) 40 MG tablet Take 20 mg by mouth daily.   11  . furosemide (LASIX) 40 MG tablet Take 1 tablet (40 mg total) by mouth daily. Take as directed for the next 3 days. 30 tablet 3  . levalbuterol (XOPENEX) 0.63 MG/3ML nebulizer solution Take 0.63 mg by nebulization 2 (two) times daily.     . naproxen (NAPROSYN) 500 MG tablet Take 500 mg by mouth 2 (two) times daily with a meal.   1  . omeprazole (PRILOSEC) 20 MG capsule Take 40 mg by mouth daily.     . potassium chloride SA (K-DUR,KLOR-CON) 20 MEQ tablet Take 1 tablet (20 mEq total) by mouth once. Take as directed for the next 3 days 30 tablet 3  . predniSONE (DELTASONE) 10 MG tablet Take 40 mg by mouth daily.    . prochlorperazine (COMPAZINE) 10 MG tablet Take 1 tablet (10 mg total) by mouth every 6 (six) hours as needed for nausea or vomiting. 30 tablet 1  . simvastatin (ZOCOR) 40 MG tablet Take 40 mg by mouth every Monday, Wednesday,  and Friday.     . temazepam (RESTORIL) 30 MG capsule Take 30 mg by mouth at bedtime as needed for sleep.    . Albuterol Sulfate (PROAIR RESPICLICK) 440 (90 BASE) MCG/ACT AEPB Inhale 2 puffs into the lungs every 6 (six) hours as needed. (Patient not taking: Reported on 12/19/2014) 1 each 5  . Alum & Mag Hydroxide-Simeth (MAGIC MOUTHWASH W/LIDOCAINE) SOLN Take 5 mLs by mouth 4 (four) times daily as needed for mouth pain. (Patient not taking: Reported on 12/19/2014) 120 mL 0  . GuaiFENesin (MUCINEX PO) Take 600 mg by mouth 2 (two) times  daily.    Marland Kitchen HYDROmorphone (DILAUDID) 2 MG tablet Take 1 tablet (2 mg total) by mouth every 6 (six) hours as needed for severe pain. (Patient not taking: Reported on 12/19/2014) 360 tablet 0   No current facility-administered medications for this visit.    SURGICAL HISTORY:  Past Surgical History  Procedure Laterality Date  . Right vats, rt thoracotomy, rt middle and rt lower  lobectomy  06/10/2011    Dr Arlyce Dice  . Knee arthroscopy    . Tonsillectomy    . Coronary stent placement  JULY,AUGUST 2011  . Orif femoral neck fracture w/ dhs    . Left heart catheterization with coronary angiogram N/A 06/17/2014    Procedure: LEFT HEART CATHETERIZATION WITH CORONARY ANGIOGRAM;  Surgeon: Candee Furbish, MD;  Location: [facility] CATH LAB;  Service: Cardiovascular;  Laterality: N/A;    REVIEW OF SYSTEMS:  A comprehensive review of systems was negative except for: Constitutional: positive for fatigue and increased recently Respiratory: positive for dyspnea on exertion and wheezing   PHYSICAL EXAMINATION: General appearance: alert, cooperative and no distress Head: Normocephalic, without obvious abnormality, atraumatic Neck: no adenopathy Lymph nodes: Cervical, supraclavicular, and axillary nodes normal. Resp: wheezes bilaterally Back: symmetric, no curvature. ROM normal. No CVA tenderness. Cardio: regular rate and rhythm, S1, S2 normal, no murmur, click, rub or gallop GI: soft, non-tender; bowel sounds normal; no masses,  no organomegaly Extremities: extremities normal, atraumatic, no cyanosis or edema Neurologic: Alert and oriented X 3, normal strength and tone. Normal symmetric reflexes. Normal coordination and gait  ECOG PERFORMANCE STATUS: 1 - Symptomatic but completely ambulatory  Blood pressure 132/64, pulse 108, temperature 97.7 F (36.5 C), temperature source Oral, resp. rate 21, height 5' 6.5" (1.689 m), weight 185 lb 8 oz (84.142 kg), SpO2 98 %.  LABORATORY DATA: Lab Results  Component Value  Date   WBC 10.0 12/19/2014   HGB 8.5* 12/19/2014   HCT 25.9* 12/19/2014   MCV 106.1* 12/19/2014   PLT 52* 12/19/2014      Chemistry      Component Value Date/Time   NA 137 12/19/2014 0810   NA 136 11/20/2014 1403   NA 142 01/10/2012 1205   K 4.0 12/19/2014 0810   K 4.7 11/20/2014 1403   K 3.9 01/10/2012 1205   CL 105 11/20/2014 1403   CL 107 10/15/2012 1521   CL 102 01/10/2012 1205   CO2 27 12/19/2014 0810   CO2 19 11/20/2014 1403   CO2 26 01/10/2012 1205   BUN 46.4* 12/19/2014 0810   BUN 39* 11/20/2014 1403   BUN 27* 01/10/2012 1205   CREATININE 1.7* 12/19/2014 0810   CREATININE 1.6* 11/20/2014 1403   CREATININE 1.1 01/10/2012 1205      Component Value Date/Time   CALCIUM 9.0 12/19/2014 0810   CALCIUM 7.6* 11/20/2014 1403   CALCIUM 8.8 01/10/2012 1205   ALKPHOS 183* 12/19/2014 0810  ALKPHOS 156* 01/28/2014 0510   ALKPHOS 271* 01/10/2012 1205   AST 22 12/19/2014 0810   AST 25 01/28/2014 0510   AST 28 01/10/2012 1205   ALT 37 12/19/2014 0810   ALT 26 01/28/2014 0510   ALT 28 01/10/2012 1205   BILITOT 0.51 12/19/2014 0810   BILITOT 0.4 01/28/2014 0510   BILITOT 0.60 01/10/2012 1205       RADIOGRAPHIC STUDIES: No results found.  ASSESSMENT AND PLAN: This is a very pleasant 74 years old white male with metastatic non-small cell lung cancer initially diagnosed as stage IIB/IIIa non-small cell lung cancer status post concurrent chemoradiation followed by right middle and right lower lobectomies and has been observation since July of 2002. He is currently on systemic chemotherapy with carboplatin and paclitaxel is status post 3 cycles and tolerating the treatment well except for fatigue and mild peripheral neuropathy. Unfortunately his platelets count are againlow today at 52,000. Today with a hemoglobin 8.5 g/dL. He is symptomatic from this level on of anemia. We will arrange to transfuse him 2 units of packed red blood cells later this afternoon. We will postpone  the start of cycle #4 of his chemotherapy until 12/31/2014. We will have his CT scan rescheduled from 12/29/2014 to either one 74 year old 12/24/2014. He will follow-up on 12/31/2014 for another symptom management visit and to discuss the results of his restaging CT scan prior to proceeding with cycle #4 of his systemic chemotherapy. For the history of COPD, he will continue his current treatment with prednisone, albuterol, Symbicort as well as Xopenex. For the metastatic bone disease, the patient will continue on Xgeva injection every 4 weeks. He was advised to take calcium and vitamin D supplements on daily basis.  He was advised to call immediately if he has any concerning symptoms in the interval. The patient voices understanding of current disease status and treatment options and is in agreement with the current care plan.  All questions were answered. The patient knows to call the clinic with any problems, questions or concerns. We can certainly see the patient much sooner if necessary.  Carlton Adam, PA-C 12/19/2014  ADDENDUM: Hematology/Oncology Attending: I had a face to face encounter with the patient. I recommended his care plan. This is a very pleasant 74 years old white male with metastatic non-small cell lung cancer currently undergoing systemic chemotherapy with carboplatin and paclitaxel is status post 3 cycles. The patient is tolerating his treatment well except for increasing fatigue secondary to chemotherapy-induced anemia. His hemoglobin and hematocrit as well as platelets count are low today. I recommended for the patient to delay the start of cycle #4 until next week. I will arrange for the patient to receive 2 units of PRBCs transfusion for the chemotherapy-induced anemia. For the metastatic bone disease, the patient will continue on Xgeva and he was advised to continue taking the calcium and vitamin D supplements on daily basis. He would come back for follow-up visit in  one week for reevaluation after repeating CT scan of the chest, abdomen and pelvis for restaging of his disease. He was advised to call immediately if he has any concerning symptoms in the interval.  Disclaimer: This note was dictated with voice recognition software. Similar sounding words can inadvertently be transcribed and may not be corrected upon review. [name], MD 12/19/2014

## 2014-12-19 NOTE — Progress Notes (Signed)
Called [facility] on pt for 2 units today

## 2014-12-19 NOTE — Patient Instructions (Signed)
Your hemoglobin is low when you're symptomatic from this particular level. We will arrange for you to receive 2 units of blood today to address your symptomatic anemia Your platelet count is low we will reschedule the start of cycle #4 of chemotherapy from 12/24/2014 to 12/31/2014. We will reschedule your restaging CT scan to an earlier date. Follow-up in 2 weeks

## 2014-12-20 LAB — TYPE AND SCREEN
ABO/RH(D): O POS
Antibody Screen: NEGATIVE
UNIT DIVISION: 0
Unit division: 0

## 2014-12-22 ENCOUNTER — Other Ambulatory Visit: Payer: Self-pay | Admitting: Internal Medicine

## 2014-12-22 ENCOUNTER — Ambulatory Visit (HOSPITAL_COMMUNITY)
Admission: RE | Admit: 2014-12-22 | Discharge: 2014-12-22 | Disposition: A | Discharge status: Home / Self Care | Payer: 59 | Source: Ambulatory Visit | Attending: Internal Medicine | Admitting: Internal Medicine

## 2014-12-22 DIAGNOSIS — Z85118 Personal history of other malignant neoplasm of bronchus and lung: Secondary | ICD-10-CM | POA: Insufficient documentation

## 2014-12-22 DIAGNOSIS — R911 Solitary pulmonary nodule: Secondary | ICD-10-CM | POA: Insufficient documentation

## 2014-12-22 DIAGNOSIS — Z902 Acquired absence of lung [part of]: Secondary | ICD-10-CM | POA: Diagnosis not present

## 2014-12-22 DIAGNOSIS — C787 Secondary malignant neoplasm of liver and intrahepatic bile duct: Secondary | ICD-10-CM | POA: Insufficient documentation

## 2014-12-22 DIAGNOSIS — C771 Secondary and unspecified malignant neoplasm of intrathoracic lymph nodes: Secondary | ICD-10-CM | POA: Diagnosis not present

## 2014-12-22 DIAGNOSIS — C782 Secondary malignant neoplasm of pleura: Secondary | ICD-10-CM | POA: Diagnosis not present

## 2014-12-22 DIAGNOSIS — J939 Pneumothorax, unspecified: Secondary | ICD-10-CM | POA: Diagnosis not present

## 2014-12-22 DIAGNOSIS — C3431 Malignant neoplasm of lower lobe, right bronchus or lung: Secondary | ICD-10-CM

## 2014-12-23 ENCOUNTER — Encounter (INDEPENDENT_AMBULATORY_CARE_PROVIDER_SITE_OTHER): Payer: 59 | Admitting: Ophthalmology

## 2014-12-23 DIAGNOSIS — H35033 Hypertensive retinopathy, bilateral: Secondary | ICD-10-CM | POA: Diagnosis not present

## 2014-12-23 DIAGNOSIS — I10 Essential (primary) hypertension: Secondary | ICD-10-CM | POA: Diagnosis not present

## 2014-12-23 DIAGNOSIS — H35371 Puckering of macula, right eye: Secondary | ICD-10-CM

## 2014-12-23 DIAGNOSIS — H43813 Vitreous degeneration, bilateral: Secondary | ICD-10-CM | POA: Diagnosis not present

## 2014-12-23 DIAGNOSIS — H34811 Central retinal vein occlusion, right eye: Secondary | ICD-10-CM

## 2014-12-24 ENCOUNTER — Ambulatory Visit: Payer: 59

## 2014-12-24 ENCOUNTER — Other Ambulatory Visit: Payer: 59

## 2014-12-25 ENCOUNTER — Ambulatory Visit (INDEPENDENT_AMBULATORY_CARE_PROVIDER_SITE_OTHER): Payer: 59 | Admitting: Cardiology

## 2014-12-25 ENCOUNTER — Ambulatory Visit: Payer: 59

## 2014-12-25 ENCOUNTER — Encounter: Payer: Self-pay | Admitting: Cardiology

## 2014-12-25 VITALS — BP 130/84 | HR 113 | Ht 66.5 in | Wt 186.0 lb

## 2014-12-25 DIAGNOSIS — R0600 Dyspnea, unspecified: Secondary | ICD-10-CM

## 2014-12-25 DIAGNOSIS — I10 Essential (primary) hypertension: Secondary | ICD-10-CM

## 2014-12-25 DIAGNOSIS — I255 Ischemic cardiomyopathy: Secondary | ICD-10-CM

## 2014-12-25 NOTE — Progress Notes (Signed)
[facility] [address], [address] [place], [place]  [address] Phone: [phone] Fax:  [phone]  Date:  12/25/2014   ID:  Todd Villanueva, Todd Villanueva 07/22/1941, MRN [id number]  PCP:  [name], MD   History of Present Illness: Todd Villanueva is a 74 y.o. male with stage III lung cancer, COPD, with ischemic cardiomyopathy ejection fraction 49-17%, grade 1 diastolic dysfunction here for follow-up of significant shortness of breath.  I previously discussed with Dr. [name]  who talked with [name], who checked oxygen saturations which were normal.  Since being on Lasix increased dose he does feel better. He also states that when he coughs vigorously and is able to bring up white mucus he feels a tremendous amount of relief for short period of time. Mucinex suggest and has helped.  Dr. [name] previously increased his steroid. Previously he had stent placement in 2011 RCA and LAD. Prior NUC stress 5/14 low risk with no ischemia.   He also had prior rectus sheath hematoma.   Creat 1.7 currently but at one point he did develop creatinine of 2.7. Continue to monitor renal function.  Recent blood transfusion has helped him out also significantly.    Wt Readings from Last 3 Encounters:  12/25/14 186 lb (84.369 kg)  12/19/14 185 lb 8 oz (84.142 kg)  11/26/14 183 lb 11.2 oz (83.326 kg)     Past Medical History  Diagnosis Date  . Hypertension   . lung ca dx'd 02/2011    xrt/chemo comp 03/30/11, stage IIIA  . Hyperlipidemia   . 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 History of angina   . CAD (coronary artery disease)     Past Surgical History  Procedure Laterality Date  . Right vats, rt thoracotomy, rt middle and rt lower  lobectomy  06/10/2011    Dr Arlyce Dice  . Knee arthroscopy    . Tonsillectomy    . Coronary stent placement  JULY,AUGUST 2011  . Orif femoral neck fracture w/ dhs    . Left heart  catheterization with coronary angiogram N/A 06/17/2014    Procedure: LEFT HEART CATHETERIZATION WITH CORONARY ANGIOGRAM;  Surgeon: Candee Furbish, MD;  Location: [facility] CATH LAB;  Service: Cardiovascular;  Laterality: N/A;    Current Outpatient Prescriptions  Medication Sig Dispense Refill  . Albuterol Sulfate (PROAIR RESPICLICK) 915 (90 BASE) MCG/ACT AEPB Inhale 2 puffs into the lungs every 6 (six) hours as needed. 1 each 5  . ALPRAZolam (XANAX) 0.25 MG tablet Take 0.25 mg by mouth 2 (two) times daily as needed for anxiety.     . Alum & Mag Hydroxide-Simeth (MAGIC MOUTHWASH W/LIDOCAINE) SOLN Take 5 mLs by mouth 4 (four) times daily as needed for mouth pain. 120 mL 0  . bevacizumab (AVASTIN) 1.25 mg/0.1 mL SOLN 1.25 mg by Intravitreal route every 30 (thirty) days.    . budesonide-formoterol (SYMBICORT) 160-4.5 MCG/ACT inhaler Inhale 2 puffs into the lungs 2 (two) times daily.    . Calcium Carbonate-Vitamin D (CALCIUM-VITAMIN D) 500-200 MG-UNIT per tablet Take 1 tablet by mouth 2 (two) times daily.    . citalopram (CELEXA) 40 MG tablet Take 20 mg by mouth daily.   11  . furosemide (LASIX) 40 MG tablet Take 1 tablet (40 mg total) by mouth daily. Take as directed for the next 3 days. 30 tablet 3  . GuaiFENesin (MUCINEX PO)  Take 600 mg by mouth 2 (two) times daily.    Marland Kitchen HYDROmorphone (DILAUDID) 2 MG tablet Take 1 tablet (2 mg total) by mouth every 6 (six) hours as needed for severe pain. 360 tablet 0  . levalbuterol (XOPENEX) 0.63 MG/3ML nebulizer solution Take 0.63 mg by nebulization 2 (two) times daily.     Marland Kitchen lisinopril (PRINIVIL,ZESTRIL) 10 MG tablet Take 10 mg by mouth daily.    . naproxen (NAPROSYN) 500 MG tablet Take 500 mg by mouth 2 (two) times daily with a meal.   1  . omeprazole (PRILOSEC) 20 MG capsule Take 40 mg by mouth daily.     . potassium chloride SA (K-DUR,KLOR-CON) 20 MEQ tablet Take 1 tablet (20 mEq total) by mouth once. Take as directed for the next 3 days 30 tablet 3  . predniSONE  (DELTASONE) 10 MG tablet Take 20 mg by mouth daily.     . prochlorperazine (COMPAZINE) 10 MG tablet Take 1 tablet (10 mg total) by mouth every 6 (six) hours as needed for nausea or vomiting. 30 tablet 1  . simvastatin (ZOCOR) 40 MG tablet Take 40 mg by mouth every Monday, Wednesday, and Friday.     . temazepam (RESTORIL) 30 MG capsule Take 30 mg by mouth at bedtime as needed for sleep.     No current facility-administered medications for this visit.    Allergies:    Allergies  Allergen Reactions  . Hydrocodone-Acetaminophen Itching  . Tramadol     Pt states will not take med b/c it does not work   . Zolpidem Tartrate Other (See Comments)    nightmares    Social History:  The patient  reports that he quit smoking about 14 months ago. His smoking use included Cigarettes. He has a 26.5 pack-year smoking history. He has never used smokeless tobacco. He reports that he drinks alcohol. He reports that he does not use illicit drugs.   Family History  Problem Relation Age of Onset  . Cancer Father     multiple myeloma  . COPD Sister     emphysema- smoked  . Cancer Brother     multiple myeloma    ROS:  Please see the history of present illness.  Main complaint is wheezing, shortness of breath. No fevers. Denies any significant angina. All other systems reviewed and negative.   PHYSICAL EXAM: VS:  BP 130/84 mmHg  Pulse 113  Ht 5' 6.5" (1.689 m)  Wt 186 lb (84.369 kg)  BMI 29.57 kg/m2 Well nourished, well developed, in no acute distress HEENT: normal, [place]/AT, EOMI, moon like facies Neck: no JVD, normal carotid upstroke, no bruit Cardiac:  normal S1, S2; tachycardic regular; no murmur Lungs: Minimized wheezing with decreased air movement,no  rhonchi or rales Abd: soft, nontender, no hepatomegaly, no bruits Ext: Trace edema, 2+ distal pulses excellent radial pulse Skin: warm and dryBruising noted GU: deferred Neuro: no focal abnormalities noted, AAO x 3  EKG:  11/17/14-sinus  tachycardia rate 104 with inferior infarct pattern. Nonspecific ST-T wave changes. No significant change - 01/27/14-sinus tachycardia rate 120.  ASSESSMENT AND PLAN:  1. Chronic shortness of breath, chronic systolic heart failure-loud wheezing heard audibly without stethoscope. Unable to utilize beta blocker because of bronchospasm. Tachycardia is likely compensatory secondary to underlying lung disease, anemia, decreased cardiac output. Overall, he feels better than he did at last clinic visit. His bronchospasm is much improved. He is curious about decreasing his prednisone from 20 down to 10. Recent 2 unit blood  transfusion. This made him feel much better. Hemoglobin 8.5. Llung cancer. Ejection fraction 35%, oxygen saturation 98%. We will go ahead and continue with Lasix 40 mg once a day as well as potassium 20 mEq once a day. Previously was taking Lasix 20 mg Monday Wednesday and Friday.  Hopefully this will continue to help with his shortness of breath. NYHA 4. We will try our best to relieve any possible cardiogenic edema.  2. Stable anginal symptoms with mild to moderate exertion and previous RCA/LAD stent. 3. CAD-as above. 4. COPD-[name]. Maximized therapy. He does not qualify for oxygen.  5. Hypertension-currently reasonably controlled. 6. Chronic kidney disease stage III-creatinine currently 1.5-1.7. He just restarted lisinopril for his hypertension. This improved the hypertension but please be sure to monitor his creatinine. 7. 3 month follow-up.  Signed, Candee Furbish, MD [facility]  12/25/2014 3:42 PM

## 2014-12-25 NOTE — Patient Instructions (Signed)
The current medical regimen is effective;  continue present plan and medications.  Follow up with Dr Marlou Porch in 3 months.  Thank you for choosing [facility]!!

## 2014-12-29 ENCOUNTER — Ambulatory Visit (HOSPITAL_COMMUNITY): Payer: 59

## 2014-12-31 ENCOUNTER — Telehealth: Payer: Self-pay | Admitting: *Deleted

## 2014-12-31 ENCOUNTER — Ambulatory Visit (HOSPITAL_BASED_OUTPATIENT_CLINIC_OR_DEPARTMENT_OTHER): Payer: 59

## 2014-12-31 ENCOUNTER — Other Ambulatory Visit: Payer: Self-pay | Admitting: Medical Oncology

## 2014-12-31 ENCOUNTER — Encounter: Payer: Self-pay | Admitting: Physician Assistant

## 2014-12-31 ENCOUNTER — Other Ambulatory Visit (HOSPITAL_BASED_OUTPATIENT_CLINIC_OR_DEPARTMENT_OTHER): Payer: 59

## 2014-12-31 ENCOUNTER — Ambulatory Visit (HOSPITAL_BASED_OUTPATIENT_CLINIC_OR_DEPARTMENT_OTHER): Payer: 59 | Admitting: Physician Assistant

## 2014-12-31 VITALS — BP 131/78 | HR 103 | Temp 98.0°F | Resp 18 | Ht 66.5 in | Wt 185.1 lb

## 2014-12-31 DIAGNOSIS — C7A1 Malignant poorly differentiated neuroendocrine tumors: Secondary | ICD-10-CM | POA: Diagnosis not present

## 2014-12-31 DIAGNOSIS — C3431 Malignant neoplasm of lower lobe, right bronchus or lung: Secondary | ICD-10-CM

## 2014-12-31 DIAGNOSIS — J449 Chronic obstructive pulmonary disease, unspecified: Secondary | ICD-10-CM | POA: Diagnosis not present

## 2014-12-31 DIAGNOSIS — C7B8 Other secondary neuroendocrine tumors: Secondary | ICD-10-CM

## 2014-12-31 DIAGNOSIS — Z5111 Encounter for antineoplastic chemotherapy: Secondary | ICD-10-CM

## 2014-12-31 DIAGNOSIS — D61810 Antineoplastic chemotherapy induced pancytopenia: Secondary | ICD-10-CM

## 2014-12-31 LAB — COMPREHENSIVE METABOLIC PANEL (CC13)
ALT: 33 U/L (ref 0–55)
AST: 22 U/L (ref 5–34)
Albumin: 3.4 g/dL — ABNORMAL LOW (ref 3.5–5.0)
Alkaline Phosphatase: 133 U/L (ref 40–150)
Anion Gap: 12 mEq/L — ABNORMAL HIGH (ref 3–11)
BUN: 48.8 mg/dL — ABNORMAL HIGH (ref 7.0–26.0)
CHLORIDE: 107 meq/L (ref 98–109)
CO2: 22 mEq/L (ref 22–29)
Calcium: 8.4 mg/dL (ref 8.4–10.4)
Creatinine: 1.6 mg/dL — ABNORMAL HIGH (ref 0.7–1.3)
EGFR: 44 mL/min/{1.73_m2} — ABNORMAL LOW (ref >90)
Glucose: 103 mg/dl (ref 70–140)
Potassium: 4.2 mEq/L (ref 3.5–5.1)
Sodium: 140 mEq/L (ref 136–145)
TOTAL PROTEIN: 6.2 g/dL — AB (ref 6.4–8.3)
Total Bilirubin: 0.51 mg/dL (ref 0.20–1.20)

## 2014-12-31 LAB — CBC WITH DIFFERENTIAL/PLATELET
BASO%: 0.2 % (ref 0.0–2.0)
Basophils Absolute: 0 10*3/uL (ref 0.0–0.1)
EOS ABS: 0 10*3/uL (ref 0.0–0.5)
EOS%: 0.2 % (ref 0.0–7.0)
HEMATOCRIT: 33.4 % — AB (ref 38.4–49.9)
HGB: 11.1 g/dL — ABNORMAL LOW (ref 13.0–17.1)
LYMPH%: 9.9 % — ABNORMAL LOW (ref 14.0–49.0)
MCH: 33.5 pg — AB (ref 27.2–33.4)
MCHC: 33.2 g/dL (ref 32.0–36.0)
MCV: 100.9 fL — ABNORMAL HIGH (ref 79.3–98.0)
MONO#: 0.8 10*3/uL (ref 0.1–0.9)
MONO%: 7.8 % (ref 0.0–14.0)
NEUT%: 81.9 % — ABNORMAL HIGH (ref 39.0–75.0)
NEUTROS ABS: 8.4 10*3/uL — AB (ref 1.5–6.5)
Platelets: 128 10*3/uL — ABNORMAL LOW (ref 140–400)
RBC: 3.31 10*6/uL — ABNORMAL LOW (ref 4.20–5.82)
RDW: 19.9 % — AB (ref 11.0–14.6)
WBC: 10.2 10*3/uL (ref 4.0–10.3)
lymph#: 1 10*3/uL (ref 0.9–3.3)

## 2014-12-31 MED ORDER — DIPHENHYDRAMINE HCL 50 MG/ML IJ SOLN
INTRAMUSCULAR | Status: AC
  Filled 2014-12-31: qty 1

## 2014-12-31 MED ORDER — SODIUM CHLORIDE 0.9 % IV SOLN
Freq: Once | INTRAVENOUS | Status: AC
  Administered 2014-12-31: 12:00:00 via INTRAVENOUS

## 2014-12-31 MED ORDER — FAMOTIDINE IN NACL 20-0.9 MG/50ML-% IV SOLN
INTRAVENOUS | Status: AC
  Filled 2014-12-31: qty 50

## 2014-12-31 MED ORDER — ONDANSETRON 16 MG/50ML IVPB (CHCC)
16.0000 mg | Freq: Once | INTRAVENOUS | Status: AC
  Administered 2014-12-31: 16 mg via INTRAVENOUS

## 2014-12-31 MED ORDER — ONDANSETRON 16 MG/50ML IVPB (CHCC)
INTRAVENOUS | Status: AC
  Filled 2014-12-31: qty 16

## 2014-12-31 MED ORDER — SODIUM CHLORIDE 0.9 % IV SOLN
382.0000 mg | Freq: Once | INTRAVENOUS | Status: AC
  Administered 2014-12-31: 380 mg via INTRAVENOUS
  Filled 2014-12-31: qty 38

## 2014-12-31 MED ORDER — DEXAMETHASONE SODIUM PHOSPHATE 20 MG/5ML IJ SOLN
INTRAMUSCULAR | Status: AC
  Filled 2014-12-31: qty 5

## 2014-12-31 MED ORDER — FAMOTIDINE IN NACL 20-0.9 MG/50ML-% IV SOLN
20.0000 mg | Freq: Once | INTRAVENOUS | Status: AC
  Administered 2014-12-31: 20 mg via INTRAVENOUS

## 2014-12-31 MED ORDER — PACLITAXEL CHEMO INJECTION 300 MG/50ML
175.0000 mg/m2 | Freq: Once | INTRAVENOUS | Status: AC
  Administered 2014-12-31: 348 mg via INTRAVENOUS
  Filled 2014-12-31: qty 58

## 2014-12-31 MED ORDER — DENOSUMAB 120 MG/1.7ML ~~LOC~~ SOLN
120.0000 mg | Freq: Once | SUBCUTANEOUS | Status: AC
Start: 2014-12-31 — End: 2014-12-31
  Administered 2014-12-31: 120 mg via SUBCUTANEOUS
  Filled 2014-12-31: qty 1.7

## 2014-12-31 MED ORDER — DEXAMETHASONE SODIUM PHOSPHATE 20 MG/5ML IJ SOLN
20.0000 mg | Freq: Once | INTRAMUSCULAR | Status: AC
  Administered 2014-12-31: 20 mg via INTRAVENOUS

## 2014-12-31 MED ORDER — DIPHENHYDRAMINE HCL 50 MG/ML IJ SOLN
50.0000 mg | Freq: Once | INTRAMUSCULAR | Status: AC
  Administered 2014-12-31: 50 mg via INTRAVENOUS

## 2014-12-31 NOTE — Telephone Encounter (Signed)
Per staff message and POF I have scheduled appts. Advised scheduler of appts. JMW  

## 2014-12-31 NOTE — Progress Notes (Signed)
1215: Per Dr. Julien Nordmann okay to treat today with creatinine of 1.6 and HR of 103.

## 2014-12-31 NOTE — Progress Notes (Addendum)
[name] [phone]   [phone]  OFFICE PROGRESS NOTE  [name], MD [place] [address] [name] [address]  DIAGNOSIS: Metastatic non-small cell lung cancer initially diagnosed as Stage IIB/IIIA non-small cell lung cancer, squamous cell carcinoma diagnosed in February of 2012.   PRIOR THERAPY:  1) Status post a course of concurrent chemoradiation with weekly carboplatin and paclitaxel. Last dose of chemotherapy was given on 03/21/2011.  2) status post right VATS, right thoracotomy, right middle and right lower lobectomies under the care of [name] on 06/10/2011.   CURRENT THERAPY: Systemic chemotherapy was carboplatin for AUC of 5 and paclitaxel 175 MG/M2 every 3 weeks with Neulasta support. First cycle on 10/14/2014. Status post 3 cycles  INTERVAL HISTORY: Todd Villanueva 74 y.o. male returns to the clinic today for followup visit, accompanied by his wife. He continues to complain of shortness of breath. He is eating better and has increased his water intake. He still is having some dizzy spells/hot flashes. He states that Dr. [name], his primary care physician, put him back on 10 mg of lisinopril daily. The patient continues to complain of increasing shortness of breath especially with exertion as well as wheezes. He is followed by [name] for his COPD. He denied having any significant cough or hemoptysis. The patient denied having any significant weight loss or night sweats. He has no nausea or vomiting. He is tolerating his systemic chemotherapy was carboplatin and paclitaxel fairly well except for fatigue and mild numbness in his fingers. He denied having any significant nausea or vomiting. Had a restaging CT scan of the chest, abdomen pelvis and presents to discuss results. He is also here to proceed with cycle #4 of his systemic chemotherapy with carboplatin and paclitaxel with Neulasta support.  MEDICAL HISTORY: Past  Medical History  Diagnosis Date  . Hypertension   . lung ca dx'd 02/2011    xrt/chemo comp 03/30/11, stage IIIA  . Hyperlipidemia   . 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 History of angina   . CAD (coronary artery disease)     ALLERGIES:  is allergic to hydrocodone-acetaminophen; morphine and related; tramadol; and zolpidem tartrate.  MEDICATIONS:  Current Outpatient Prescriptions  Medication Sig Dispense Refill  . Albuterol Sulfate (PROAIR RESPICLICK) 702 (90 BASE) MCG/ACT AEPB Inhale 2 puffs into the lungs every 6 (six) hours as needed. 1 each 5  . ALPRAZolam (XANAX) 0.25 MG tablet Take 0.25 mg by mouth 2 (two) times daily as needed for anxiety.     . Alum & Mag Hydroxide-Simeth (MAGIC MOUTHWASH W/LIDOCAINE) SOLN Take 5 mLs by mouth 4 (four) times daily as needed for mouth pain. 120 mL 0  . bevacizumab (AVASTIN) 1.25 mg/0.1 mL SOLN 1.25 mg by Intravitreal route every 30 (thirty) days.    . budesonide-formoterol (SYMBICORT) 160-4.5 MCG/ACT inhaler Inhale 2 puffs into the lungs 2 (two) times daily.    . Calcium Carbonate-Vitamin D (CALCIUM-VITAMIN D) 500-200 MG-UNIT per tablet Take 1 tablet by mouth 2 (two) times daily.    . citalopram (CELEXA) 40 MG tablet Take 20 mg by mouth daily.   11  . furosemide (LASIX) 40 MG tablet Take 1 tablet (40 mg total) by mouth daily. Take as directed for the next 3 days. 30 tablet 3  . GuaiFENesin (MUCINEX PO) Take 600 mg by mouth 2 (two)  times daily.    Marland Kitchen HYDROmorphone (DILAUDID) 2 MG tablet Take 1 tablet (2 mg total) by mouth every 6 (six) hours as needed for severe pain. 360 tablet 0  . levalbuterol (XOPENEX) 0.63 MG/3ML nebulizer solution Take 0.63 mg by nebulization 2 (two) times daily.     Marland Kitchen lisinopril (PRINIVIL,ZESTRIL) 10 MG tablet Take 10 mg by mouth daily.    . naproxen (NAPROSYN) 500 MG tablet Take 500 mg by mouth 2 (two) times daily with a meal.   1  . omeprazole  (PRILOSEC) 20 MG capsule Take 40 mg by mouth daily.     . potassium chloride SA (K-DUR,KLOR-CON) 20 MEQ tablet Take 1 tablet (20 mEq total) by mouth once. Take as directed for the next 3 days 30 tablet 3  . predniSONE (DELTASONE) 10 MG tablet Take 20 mg by mouth daily.     . prochlorperazine (COMPAZINE) 10 MG tablet Take 1 tablet (10 mg total) by mouth every 6 (six) hours as needed for nausea or vomiting. 30 tablet 1  . simvastatin (ZOCOR) 40 MG tablet Take 40 mg by mouth every Monday, Wednesday, and Friday.     . temazepam (RESTORIL) 30 MG capsule Take 30 mg by mouth at bedtime as needed for sleep.     No current facility-administered medications for this visit.   Facility-Administered Medications Ordered in Other Visits  Medication Dose Route Frequency Provider Last Rate Last Dose  . CARBOplatin (PARAPLATIN) 380 mg in sodium chloride 0.9 % 100 mL chemo infusion  380 mg Intravenous Once [name], MD      . PACLitaxel (TAXOL) 348 mg in dextrose 5 % 500 mL chemo infusion (> 80mg /m2)  175 mg/m2 (Treatment Plan Actual) Intravenous Once [name], MD 186 mL/hr at 12/31/14 1309 348 mg at 12/31/14 1309    SURGICAL HISTORY:  Past Surgical History  Procedure Laterality Date  . Right vats, rt thoracotomy, rt middle and rt lower  lobectomy  06/10/2011    Dr Arlyce Dice  . Knee arthroscopy    . Tonsillectomy    . Coronary stent placement  JULY,AUGUST 2011  . Orif femoral neck fracture w/ dhs    . Left heart catheterization with coronary angiogram N/A 06/17/2014    Procedure: LEFT HEART CATHETERIZATION WITH CORONARY ANGIOGRAM;  Surgeon: Candee Furbish, MD;  Location[facility] CATH LAB;  Service: Cardiovascular;  Laterality: N/A;    REVIEW OF SYSTEMS:  A comprehensive review of systems was negative except for: Constitutional: positive for fatigue and increased recently Respiratory: positive for dyspnea on exertion and wheezing   PHYSICAL EXAMINATION: General appearance: alert, cooperative and no  distress Head: Normocephalic, without obvious abnormality, atraumatic Neck: no adenopathy Lymph nodes: Cervical, supraclavicular, and axillary nodes normal. Resp: wheezes bilaterally Back: symmetric, no curvature. ROM normal. No CVA tenderness. Cardio: regular rate and rhythm, S1, S2 normal, no murmur, click, rub or gallop GI: soft, non-tender; bowel sounds normal; no masses,  no organomegaly Extremities: extremities normal, atraumatic, no cyanosis or edema Neurologic: Alert and oriented X 3, normal strength and tone. Normal symmetric reflexes. Normal coordination and gait  ECOG PERFORMANCE STATUS: 1 - Symptomatic but completely ambulatory  Blood pressure 131/78, pulse 103, temperature 98 F (36.7 C), temperature source Oral, resp. rate 18, height 5' 6.5" (1.689 m), weight 185 lb 1.6 oz (83.961 kg).  LABORATORY DATA: Lab Results  Component Value Date   WBC 10.2 12/31/2014   HGB 11.1* 12/31/2014   HCT 33.4* 12/31/2014   MCV 100.9* 12/31/2014   PLT  128* 12/31/2014      Chemistry      Component Value Date/Time   NA 140 12/31/2014 1016   NA 136 11/20/2014 1403   NA 142 01/10/2012 1205   K 4.2 12/31/2014 1016   K 4.7 11/20/2014 1403   K 3.9 01/10/2012 1205   CL 105 11/20/2014 1403   CL 107 10/15/2012 1521   CL 102 01/10/2012 1205   CO2 22 12/31/2014 1016   CO2 19 11/20/2014 1403   CO2 26 01/10/2012 1205   BUN 48.8* 12/31/2014 1016   BUN 39* 11/20/2014 1403   BUN 27* 01/10/2012 1205   CREATININE 1.6* 12/31/2014 1016   CREATININE 1.6* 11/20/2014 1403   CREATININE 1.1 01/10/2012 1205      Component Value Date/Time   CALCIUM 8.4 12/31/2014 1016   CALCIUM 7.6* 11/20/2014 1403   CALCIUM 8.8 01/10/2012 1205   ALKPHOS 133 12/31/2014 1016   ALKPHOS 156* 01/28/2014 0510   ALKPHOS 271* 01/10/2012 1205   AST 22 12/31/2014 1016   AST 25 01/28/2014 0510   AST 28 01/10/2012 1205   ALT 33 12/31/2014 1016   ALT 26 01/28/2014 0510   ALT 28 01/10/2012 1205   BILITOT 0.51  12/31/2014 1016   BILITOT 0.4 01/28/2014 0510   BILITOT 0.60 01/10/2012 1205       RADIOGRAPHIC STUDIES: Ct Abdomen Pelvis Wo Contrast  12/23/2014   ADDENDUM REPORT: 12/23/2014 08:31  ADDENDUM: Critical Value/emergent results were called by telephone at the time of interpretation on 12/22/2014 at 11:30 am to Dr. [name] , who verbally acknowledged these results.   Electronically Signed   By: Camie Patience M.D.   On: 12/23/2014 08:31   12/23/2014   CLINICAL DATA:  History of non-small cell lung cancer status post right middle and lower lobectomies. Undergoing chemotherapy. Subsequent encounter.  EXAM: CT CHEST, ABDOMEN AND PELVIS WITHOUT CONTRAST  TECHNIQUE: Multidetector CT imaging of the chest, abdomen and pelvis was performed following the standard protocol without IV contrast.  COMPARISON:  CTs 10/15/2014 and 09/17/2014.  PET-CT 09/30/2014.  FINDINGS: CT CHEST FINDINGS  Mediastinum: Although the hilar disease is suboptimally evaluated without contrast, there has been significant improvement in the previously demonstrated left hilar mass and mediastinal lymphadenopathy. A previously demonstrated hypermetabolic AP window node measuring 1.9 cm now measures 8 mm on image 27. A 1.8 cm high left paratracheal node on the prior study has nearly completely resolved. A 9 mm high right paratracheal node on image number 11 is only slightly smaller, although was not hypermetabolic on PET-CT. The thyroid gland, trachea and esophagus demonstrate no significant findings. The heart size is normal. There is no pericardial effusion.There is diffuse atherosclerosis of the aorta, great vessels and coronary arteries.  Lungs/Pleura: There is stable volume loss in the right hemithorax status post right middle and lower lobe resection. Bronchiectasis, scarring and opacity dependently in the right upper lobe and diffuse emphysematous changes are stable. There is a new small right-sided pneumothorax anteriorly.The pleural  placed metastases noted on the prior examination have resolved. However, there is new ill-defined 10 mm nodule in the superior segment of the left lower lobe on image number 25. Emphysema and scattered subpleural fibrosis are present in both lungs.  Musculoskeletal/Chest wall: There are stable right thoracotomy changes. T7 compression fracture appears unchanged. No suspicious osseous lesions demonstrated in the chest.  CT ABDOMEN AND PELVIS FINDINGS  Hepatobiliary: Hepatic metastatic disease has improved. The residual lesions are smaller and lower in density, including a 1.4  cm lesion in the dome of the right lobe on image 40 and a 1.1 cm lesion anteriorly in the right lobe on image 47. No evidence of gallstones, gallbladder wall thickening or biliary dilatation.  Pancreas: Unremarkable. No pancreatic ductal dilatation or surrounding inflammatory changes.  Spleen: Normal in size without focal abnormality.  Adrenals/Urinary Tract: Both adrenal glands appear normal.The kidneys appear normal without evidence of urinary tract calculus or hydronephrosis. No bladder abnormalities are seen.  Stomach/Bowel: No evidence of bowel wall thickening, distention or surrounding inflammatory change.No ascites or focal extraluminal fluid collection.  Vascular/Lymphatic: There are no enlarged abdominal or pelvic lymph nodes. There is stable aortoiliac atherosclerosis.  Reproductive: Stable mild enlargement of the prostate gland.  Other: No evidence of abdominal wall mass or hernia.  Musculoskeletal: No progressive osseous metastatic disease or pathologic fracture demonstrated. 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 There is some sclerosis in the left femoral head is well which could reflect early AVN.  IMPRESSION: 1. Interval significant improvement in previously demonstrated metastatic disease to multiple mediastinal/left hilar lymph nodes  and the left pleural space. 2. Interval improvement in hepatic metastatic disease. 3. No progressive osseous metastases or new pathologic fractures identified. 4. New small right-sided pneumothorax, unlikely to be clinically significant given the previous partial lung resection and probable associated pleural scarring. 5. New ill-defined sub solid nodule in the superior segment of the left lower lobe. This is potentially inflammatory. Attention on follow-up recommended.  Electronically Signed: By: Camie Patience M.D. On: 12/22/2014 10:08   Ct Chest Wo Contrast  12/23/2014   ADDENDUM REPORT: 12/23/2014 08:31  ADDENDUM: Critical Value/emergent results were called by telephone at the time of interpretation on 12/22/2014 at 11:30 am to Dr. [name] , who verbally acknowledged these results.   Electronically Signed   By: Camie Patience M.D.   On: 12/23/2014 08:31   12/23/2014   CLINICAL DATA:  History of non-small cell lung cancer status post right middle and lower lobectomies. Undergoing chemotherapy. Subsequent encounter.  EXAM: CT CHEST, ABDOMEN AND PELVIS WITHOUT CONTRAST  TECHNIQUE: Multidetector CT imaging of the chest, abdomen and pelvis was performed following the standard protocol without IV contrast.  COMPARISON:  CTs 10/15/2014 and 09/17/2014.  PET-CT 09/30/2014.  FINDINGS: CT CHEST FINDINGS  Mediastinum: Although the hilar disease is suboptimally evaluated without contrast, there has been significant improvement in the previously demonstrated left hilar mass and mediastinal lymphadenopathy. A previously demonstrated hypermetabolic AP window node measuring 1.9 cm now measures 8 mm on image 27. A 1.8 cm high left paratracheal node on the prior study has nearly completely resolved. A 9 mm high right paratracheal node on image number 11 is only slightly smaller, although was not hypermetabolic on PET-CT. The thyroid gland, trachea and esophagus demonstrate no significant findings. The heart size is normal.  There is no pericardial effusion.There is diffuse atherosclerosis of the aorta, great vessels and coronary arteries.  Lungs/Pleura: There is stable volume loss in the right hemithorax status post right middle and lower lobe resection. Bronchiectasis, scarring and opacity dependently in the right upper lobe and diffuse emphysematous changes are stable. There is a new small right-sided pneumothorax anteriorly.The pleural placed metastases noted on the prior examination have resolved. However, there is new ill-defined 10 mm nodule in the superior segment of the left lower lobe on image number 25. Emphysema and scattered subpleural fibrosis are present in both lungs.  Musculoskeletal/Chest wall: There  are stable right thoracotomy changes. T7 compression fracture appears unchanged. No suspicious osseous lesions demonstrated in the chest.  CT ABDOMEN AND PELVIS FINDINGS  Hepatobiliary: Hepatic metastatic disease has improved. The residual lesions are smaller and lower in density, including a 1.4 cm lesion in the dome of the right lobe on image 40 and a 1.1 cm lesion anteriorly in the right lobe on image 47. No evidence of gallstones, gallbladder wall thickening or biliary dilatation.  Pancreas: Unremarkable. No pancreatic ductal dilatation or surrounding inflammatory changes.  Spleen: Normal in size without focal abnormality.  Adrenals/Urinary Tract: Both adrenal glands appear normal.The kidneys appear normal without evidence of urinary tract calculus or hydronephrosis. No bladder abnormalities are seen.  Stomach/Bowel: No evidence of bowel wall thickening, distention or surrounding inflammatory change.No ascites or focal extraluminal fluid collection.  Vascular/Lymphatic: There are no enlarged abdominal or pelvic lymph nodes. There is stable aortoiliac atherosclerosis.  Reproductive: Stable mild enlargement of the prostate gland.  Other: No evidence of abdominal wall mass or hernia.  Musculoskeletal: No progressive  osseous metastatic disease or pathologic fracture demonstrated. 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 There is some sclerosis in the left femoral head is well which could reflect early AVN.  IMPRESSION: 1. Interval significant improvement in previously demonstrated metastatic disease to multiple mediastinal/left hilar lymph nodes and the left pleural space. 2. Interval improvement in hepatic metastatic disease. 3. No progressive osseous metastases or new pathologic fractures identified. 4. New small right-sided pneumothorax, unlikely to be clinically significant given the previous partial lung resection and probable associated pleural scarring. 5. New ill-defined sub solid nodule in the superior segment of the left lower lobe. This is potentially inflammatory. Attention on follow-up recommended.  Electronically Signed: By: Camie Patience M.D. On: 12/22/2014 10:08    ASSESSMENT AND PLAN: This is a very pleasant 74 years old white male with metastatic non-small cell lung cancer initially diagnosed as stage IIB/IIIa non-small cell lung cancer status post concurrent chemoradiation followed by right middle and right lower lobectomies and has been observation since July of 2002. He is currently on systemic chemotherapy with carboplatin and paclitaxel is status post 3 cycles and tolerating the treatment well except for fatigue and mild peripheral neuropathy. Patient was discussed with and also seen by Dr. Julien Nordmann. His platelet count has recovered at 128,000 and he will proceed with cycle #4 of his systemic chemotherapy today as scheduled. His recent CT scan revealed overall improvement in his disease. He will proceed with 3 more cycles of the same chemotherapy we will do another restaging CT scan after cycle #6. He will continue with weekly labs in the interim. For the history of COPD, he will continue his current treatment  with prednisone, albuterol, Symbicort as well as Xopenex. For the metastatic bone disease, the patient will continue on Xgeva injection every 4 weeks. He was advised to take calcium and vitamin D supplements on daily basis.  He was advised to call immediately if he has any concerning symptoms in the interval. The patient voices understanding of current disease status and treatment options and is in agreement with the current care plan.  All questions were answered. The patient knows to call the clinic with any problems, questions or concerns. We can certainly see the patient much sooner if necessary.  Carlton Adam, PA-C 12/31/2014  ADDENDUM: Hematology/Oncology Attending: I had a face to face encounter with the patient. I recommended his  care plan. This is a very pleasant 74 years old white male with metastatic non-small cell lung cancer currently undergoing systemic chemotherapy with carboplatin and paclitaxel is status post 3 cycles. He tolerated the last 3 cycles fairly well except for chemotherapy-induced pancytopenia. His recent CT scan of the chest, abdomen and pelvis showed improvement in his disease. I recommended for the patient to resume her systemic chemotherapy with carboplatin and paclitaxel. For COPD he would continue on Symbicort and Xopenex. For the metastatic bone disease the patient will continue on Xgeva every 4 weeks and he was advised to continue taking calcium and vitamin D supplements. He would come back for follow-up visit in 3 weeks with the start of cycle #5. He was advised to call immediately if he has any concerning symptoms in the interval.  Disclaimer: This note was dictated with voice recognition software. Similar sounding words can inadvertently be transcribed and may be missed upon review. [name], MD 01/05/2015

## 2014-12-31 NOTE — Patient Instructions (Signed)
[facility] Discharge Instructions for Patients Receiving Chemotherapy  Today you received the following chemotherapy agents: Taxol and Carboplatin.  To help prevent nausea and vomiting after your treatment, we encourage you to take your nausea medication as prescribed.   If you develop nausea and vomiting that is not controlled by your nausea medication, call the clinic.   BELOW ARE SYMPTOMS THAT SHOULD BE REPORTED IMMEDIATELY:  *FEVER GREATER THAN 100.5 F  *CHILLS WITH OR WITHOUT FEVER  NAUSEA AND VOMITING THAT IS NOT CONTROLLED WITH YOUR NAUSEA MEDICATION  *UNUSUAL SHORTNESS OF BREATH  *UNUSUAL BRUISING OR BLEEDING  TENDERNESS IN MOUTH AND THROAT WITH OR WITHOUT PRESENCE OF ULCERS  *URINARY PROBLEMS  *BOWEL PROBLEMS  UNUSUAL RASH Items with * indicate a potential emergency and should be followed up as soon as possible.  Feel free to call the clinic you have any questions or concerns. The clinic phone number is [phone].    

## 2015-01-01 ENCOUNTER — Ambulatory Visit: Payer: 59

## 2015-01-01 ENCOUNTER — Telehealth: Payer: Self-pay | Admitting: *Deleted

## 2015-01-01 NOTE — Telephone Encounter (Signed)
I have adjusted 2/17

## 2015-01-02 ENCOUNTER — Ambulatory Visit (HOSPITAL_BASED_OUTPATIENT_CLINIC_OR_DEPARTMENT_OTHER): Payer: 59

## 2015-01-02 DIAGNOSIS — C7A1 Malignant poorly differentiated neuroendocrine tumors: Secondary | ICD-10-CM

## 2015-01-02 DIAGNOSIS — C3431 Malignant neoplasm of lower lobe, right bronchus or lung: Secondary | ICD-10-CM

## 2015-01-02 DIAGNOSIS — C7B8 Other secondary neuroendocrine tumors: Secondary | ICD-10-CM

## 2015-01-02 DIAGNOSIS — Z5189 Encounter for other specified aftercare: Secondary | ICD-10-CM

## 2015-01-02 MED ORDER — PEGFILGRASTIM INJECTION 6 MG/0.6ML ~~LOC~~
6.0000 mg | PREFILLED_SYRINGE | Freq: Once | SUBCUTANEOUS | Status: AC
  Administered 2015-01-02: 6 mg via SUBCUTANEOUS
  Filled 2015-01-02: qty 0.6

## 2015-01-04 NOTE — Patient Instructions (Signed)
Your restaging CT scan revealed improvement in your disease Continue with weekly labs as scheduled Follow-up in 3 weeks prior to next scheduled cycle of chemotherapy

## 2015-01-06 ENCOUNTER — Telehealth: Payer: Self-pay | Admitting: *Deleted

## 2015-01-06 MED ORDER — MAGIC MOUTHWASH W/LIDOCAINE: 5.0000 mL | Freq: Four times a day (QID) | ORAL | Status: AC | PRN

## 2015-01-06 NOTE — Telephone Encounter (Signed)
Pt called to TRIAGE stating onset of " mouth blisters again since restarting chemo ".  Pt attempted to refill MMW and was told " it is inappropriate " per [facility] outpt pharmacy.  " I do not know why they told me that ".  Per further inquiry by this RN regarding mouth sores- pt states he " has not looked at them " but while on the phone had a co worker look at his mouth and noted white patches and a white coated tongue.  Return call number given as [phone].  This RN attempted to reach MD/RN and midlevel to inquire if additional antifungal appropriate - unable to reach by phone.  Refill called to pharmacy for MMW and pt notified.  THIS NOTE WILL BE SENT TO MD AND RN AT DESK FOR INQUIRY OF ANTIFUNGAL - AND RETURN TO PT IF ADDITIONAL PRESCRIPTION CALLED TO PHARMACY.

## 2015-01-06 NOTE — Telephone Encounter (Signed)
The request was appropriate but came in a request that I could not approve.

## 2015-01-15 ENCOUNTER — Telehealth: Payer: Self-pay | Admitting: Physician Assistant

## 2015-01-15 NOTE — Telephone Encounter (Signed)
Patient confirmed appointment change for 02/15.

## 2015-01-19 ENCOUNTER — Telehealth: Payer: Self-pay | Admitting: Cardiology

## 2015-01-19 ENCOUNTER — Telehealth: Payer: Self-pay | Admitting: Physician Assistant

## 2015-01-19 ENCOUNTER — Other Ambulatory Visit (HOSPITAL_BASED_OUTPATIENT_CLINIC_OR_DEPARTMENT_OTHER): Payer: 59

## 2015-01-19 ENCOUNTER — Encounter: Payer: Self-pay | Admitting: Physician Assistant

## 2015-01-19 ENCOUNTER — Ambulatory Visit (HOSPITAL_BASED_OUTPATIENT_CLINIC_OR_DEPARTMENT_OTHER): Payer: 59 | Admitting: Physician Assistant

## 2015-01-19 VITALS — BP 123/63 | HR 114 | Temp 97.7°F | Resp 19 | Ht 66.5 in | Wt 179.7 lb

## 2015-01-19 DIAGNOSIS — D6481 Anemia due to antineoplastic chemotherapy: Secondary | ICD-10-CM | POA: Diagnosis not present

## 2015-01-19 DIAGNOSIS — C3431 Malignant neoplasm of lower lobe, right bronchus or lung: Secondary | ICD-10-CM

## 2015-01-19 DIAGNOSIS — C3492 Malignant neoplasm of unspecified part of left bronchus or lung: Secondary | ICD-10-CM

## 2015-01-19 DIAGNOSIS — C7951 Secondary malignant neoplasm of bone: Secondary | ICD-10-CM

## 2015-01-19 DIAGNOSIS — J449 Chronic obstructive pulmonary disease, unspecified: Secondary | ICD-10-CM

## 2015-01-19 LAB — COMPREHENSIVE METABOLIC PANEL (CC13)
ALT: 40 U/L (ref 0–55)
AST: 23 U/L (ref 5–34)
Albumin: 3.6 g/dL (ref 3.5–5.0)
Alkaline Phosphatase: 149 U/L (ref 40–150)
Anion Gap: 12 mEq/L — ABNORMAL HIGH (ref 3–11)
BUN: 51.3 mg/dL — ABNORMAL HIGH (ref 7.0–26.0)
CO2: 21 meq/L — AB (ref 22–29)
Calcium: 10 mg/dL (ref 8.4–10.4)
Chloride: 107 mEq/L (ref 98–109)
Creatinine: 1.6 mg/dL — ABNORMAL HIGH (ref 0.7–1.3)
EGFR: 42 mL/min/{1.73_m2} — AB (ref >90)
GLUCOSE: 93 mg/dL (ref 70–140)
POTASSIUM: 5 meq/L (ref 3.5–5.1)
SODIUM: 140 meq/L (ref 136–145)
Total Bilirubin: 0.39 mg/dL (ref 0.20–1.20)
Total Protein: 7 g/dL (ref 6.4–8.3)

## 2015-01-19 LAB — CBC WITH DIFFERENTIAL/PLATELET
BASO%: 0.7 % (ref 0.0–2.0)
Basophils Absolute: 0 10*3/uL (ref 0.0–0.1)
EOS%: 0.1 % (ref 0.0–7.0)
Eosinophils Absolute: 0 10*3/uL (ref 0.0–0.5)
HEMATOCRIT: 29.8 % — AB (ref 38.4–49.9)
HGB: 9.6 g/dL — ABNORMAL LOW (ref 13.0–17.1)
LYMPH%: 27.4 % (ref 14.0–49.0)
MCH: 33.4 pg (ref 27.2–33.4)
MCHC: 32.3 g/dL (ref 32.0–36.0)
MCV: 103.5 fL — ABNORMAL HIGH (ref 79.3–98.0)
MONO#: 0.6 10*3/uL (ref 0.1–0.9)
MONO%: 9.2 % (ref 0.0–14.0)
NEUT%: 62.6 % (ref 39.0–75.0)
NEUTROS ABS: 4.1 10*3/uL (ref 1.5–6.5)
Platelets: 30 10*3/uL — ABNORMAL LOW (ref 140–400)
RBC: 2.87 10*6/uL — ABNORMAL LOW (ref 4.20–5.82)
RDW: 22.2 % — AB (ref 11.0–14.6)
WBC: 6.6 10*3/uL (ref 4.0–10.3)
lymph#: 1.8 10*3/uL (ref 0.9–3.3)

## 2015-01-19 LAB — HOLD TUBE, BLOOD BANK

## 2015-01-19 NOTE — Telephone Encounter (Signed)
Pt confirmed labs/ov per 02/15 POF, gave pt AVS... KJ, sent msg to r/s chemo 02/17 to 02/24...Marland KitchenMarland Kitchen

## 2015-01-19 NOTE — Telephone Encounter (Signed)
Yes, he can take as he wishes to take as described.  Candee Furbish, MD

## 2015-01-19 NOTE — Patient Instructions (Signed)
Return in one week with repeat labs to see if your platelet count has recovered enough to proceed with chemotherapy as scheduled Follow-up in 4 weeks for another symptom management visit prior to your next scheduled cycle of chemotherapy

## 2015-01-19 NOTE — Telephone Encounter (Signed)
New message     Pt c/o medication issue:  1. Name of Medication: lasix 2. How are you currently taking this medication (dosage and times per day)? 20mg  3. Are you having a reaction (difficulty breathing--STAT)?   4. What is your medication issue? Pt want to know if he can stop taking lasix because he is up several times at night going to the bathroom

## 2015-01-19 NOTE — Progress Notes (Addendum)
[place] [phone]   [phone]  OFFICE PROGRESS NOTE  Todd Screws, MD [address] [place] [address]  DIAGNOSIS: Metastatic non-small cell lung cancer initially diagnosed as Stage IIB/IIIA non-small cell lung cancer, squamous cell carcinoma diagnosed in February of 2012.   PRIOR THERAPY:  1) Status post a course of concurrent chemoradiation with weekly carboplatin and paclitaxel. Last dose of chemotherapy was given on 03/21/2011.  2) status post right VATS, right thoracotomy, right middle and right lower lobectomies under the care of Dr. Arlyce Villanueva on 06/10/2011.   CURRENT THERAPY: Systemic chemotherapy was carboplatin for AUC of 5 and paclitaxel 175 MG/M2 every 3 weeks with Neulasta support. First cycle on 10/14/2014. Status post 4 cycles  INTERVAL HISTORY: Todd Villanueva 74 y.o. Todd returns to the clinic today for followup visit.  He continues to complain of shortness of breath. He states that he can only walk a short distance before having to wrist as a result of the shortness of breath. He reports that he is currently on amoxicillin from his  Pulmonologist, Dr. Halford Villanueva. He states this pulmonologist feels that his increase shortness of breath is not related to his COPD. Todd Villanueva is interested in seeing one of the surgeons to see if this everything possibly could be done from a surgical standpoint such as stent placement that might help him breathe better. He is eating better and has increased his water intake. He still is having some dizzy spells with change of position. He admits to not eating or drinking well over the past few weeks and has lost approximately 5 pounds since 12/31/2014.  He reports having a problem with constipation and had some blood per rectum which he felt was related to the constipation and perhaps a hemorrhoid or skin tear. He started taking stool softeners and had complete resolution of the blood per  rectum and is had no subsequent episodes. He denied having any significant cough or hemoptysis. The patient denied having any night sweats. He has no nausea or vomiting. He is tolerating his systemic chemotherapy was carboplatin and paclitaxel fairly well except for fatigue and mild numbness in his fingers and toes. He denied having any significant nausea or vomiting.  He is here for a symptom management visit prior to the start of cycle #5 of his systemic chemotherapy with carboplatin and paclitaxel with Neulasta support.  MEDICAL HISTORY: Past Medical History  Diagnosis Date  . Hypertension   . lung ca dx'd 02/2011    xrt/chemo comp 03/30/11, stage IIIA  . Hyperlipidemia   . 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 History of angina   . CAD (coronary artery disease)     ALLERGIES:  is allergic to hydrocodone-acetaminophen; morphine and related; tramadol; and zolpidem tartrate.  MEDICATIONS:  Current Outpatient Prescriptions  Medication Sig Dispense Refill  . Albuterol Sulfate (PROAIR RESPICLICK) 623 (90 BASE) MCG/ACT AEPB Inhale 2 puffs into the lungs every 6 (six) hours as needed. 1 each 5  . Alum & Mag Hydroxide-Simeth (MAGIC MOUTHWASH W/LIDOCAINE) SOLN Take 5 mLs by mouth 4 (four) times daily as needed for mouth pain. 240 mL 3  . bevacizumab (AVASTIN) 1.25 mg/0.1 mL SOLN 1.25 mg by Intravitreal route every 30 (thirty) days.    . budesonide-formoterol (SYMBICORT) 160-4.5 MCG/ACT inhaler Inhale 2 puffs into the lungs 2 (two) times daily.    Marland Kitchen  Calcium Carbonate-Vitamin D (CALCIUM-VITAMIN D) 500-200 MG-UNIT per tablet Take 1 tablet by mouth 2 (two) times daily.    . citalopram (CELEXA) 40 MG tablet Take 20 mg by mouth daily.   11  . furosemide (LASIX) 40 MG tablet Take 1 tablet (40 mg total) by mouth daily. Take as directed for the next 3 days. 30 tablet 3  . GuaiFENesin (MUCINEX PO) Take 600 mg by mouth 2 (two) times  daily.    Marland Kitchen levalbuterol (XOPENEX) 0.63 MG/3ML nebulizer solution Take 0.63 mg by nebulization 2 (two) times daily.     Marland Kitchen lisinopril (PRINIVIL,ZESTRIL) 10 MG tablet Take 10 mg by mouth daily.    . naproxen (NAPROSYN) 500 MG tablet Take 500 mg by mouth 2 (two) times daily with a meal.   1  . omeprazole (PRILOSEC) 20 MG capsule Take 40 mg by mouth daily.     . potassium chloride SA (K-DUR,KLOR-CON) 20 MEQ tablet Take 1 tablet (20 mEq total) by mouth once. Take as directed for the next 3 days 30 tablet 3  . predniSONE (DELTASONE) 10 MG tablet Take 20 mg by mouth daily.     . prochlorperazine (COMPAZINE) 10 MG tablet Take 1 tablet (10 mg total) by mouth every 6 (six) hours as needed for nausea or vomiting. 30 tablet 1  . simvastatin (ZOCOR) 40 MG tablet Take 40 mg by mouth every Monday, Wednesday, and Friday.     . temazepam (RESTORIL) 30 MG capsule Take 30 mg by mouth at bedtime as needed for sleep.    Marland Kitchen ALPRAZolam (XANAX) 0.25 MG tablet Take 0.25 mg by mouth 2 (two) times daily as needed for anxiety.     Marland Kitchen HYDROmorphone (DILAUDID) 2 MG tablet Take 1 tablet (2 mg total) by mouth every 6 (six) hours as needed for severe pain. (Patient not taking: Reported on 01/19/2015) 360 tablet 0   No current facility-administered medications for this visit.    SURGICAL HISTORY:  Past Surgical History  Procedure Laterality Date  . Right vats, rt thoracotomy, rt middle and rt lower  lobectomy  06/10/2011    Dr Todd Villanueva  . Knee arthroscopy    . Tonsillectomy    . Coronary stent placement  JULY,AUGUST 2011  . Orif femoral neck fracture w/ dhs    . Left heart catheterization with coronary angiogram N/A 06/17/2014    Procedure: LEFT HEART CATHETERIZATION WITH CORONARY ANGIOGRAM;  Surgeon: Todd Furbish, MD;  Location: [facility] CATH LAB;  Service: Cardiovascular;  Laterality: N/A;    REVIEW OF SYSTEMS:  A comprehensive review of systems was negative except for: Constitutional: positive for fatigue and increased  recently Respiratory: positive for dyspnea on exertion and wheezing Gastrointestinal: positive for constipation and Bright red blood per rectum which resolved after using stool softener regularly Neurological: positive for paresthesia   PHYSICAL EXAMINATION: General appearance: alert, cooperative and no distress Head: Normocephalic, without obvious abnormality, atraumatic Neck: no adenopathy Lymph nodes: Cervical, supraclavicular, and axillary nodes normal. Resp: wheezes bilaterally Back: symmetric, no curvature. ROM normal. No CVA tenderness. Cardio: regular rate and rhythm, S1, S2 normal, no murmur, click, rub or gallop GI: soft, non-tender; bowel sounds normal; no masses,  no organomegaly Extremities: extremities normal, atraumatic, no cyanosis or edema Neurologic: Alert and oriented X 3, normal strength and tone. Normal symmetric reflexes. Normal coordination and gait  ECOG PERFORMANCE STATUS: 1 - Symptomatic but completely ambulatory  Blood pressure 123/63, pulse 114, temperature 97.7 F (36.5 C), temperature source Oral, resp. rate 19, height  5' 6.5" (1.689 m), weight 179 lb 11.2 oz (81.511 kg), SpO2 97 %.  LABORATORY DATA: Lab Results  Component Value Date   WBC 6.6 01/19/2015   HGB 9.6* 01/19/2015   HCT 29.8* 01/19/2015   MCV 103.5* 01/19/2015   PLT 30* 01/19/2015      Chemistry      Component Value Date/Time   NA 140 01/19/2015 0849   NA 136 11/20/2014 1403   NA 142 01/10/2012 1205   K 5.0 01/19/2015 0849   K 4.7 11/20/2014 1403   K 3.9 01/10/2012 1205   CL 105 11/20/2014 1403   CL 107 10/15/2012 1521   CL 102 01/10/2012 1205   CO2 21* 01/19/2015 0849   CO2 19 11/20/2014 1403   CO2 26 01/10/2012 1205   BUN 51.3* 01/19/2015 0849   BUN 39* 11/20/2014 1403   BUN 27* 01/10/2012 1205   CREATININE 1.6* 01/19/2015 0849   CREATININE 1.6* 11/20/2014 1403   CREATININE 1.1 01/10/2012 1205      Component Value Date/Time   CALCIUM 10.0 01/19/2015 0849   CALCIUM 7.6*  11/20/2014 1403   CALCIUM 8.8 01/10/2012 1205   ALKPHOS 149 01/19/2015 0849   ALKPHOS 156* 01/28/2014 0510   ALKPHOS 271* 01/10/2012 1205   AST 23 01/19/2015 0849   AST 25 01/28/2014 0510   AST 28 01/10/2012 1205   ALT 40 01/19/2015 0849   ALT 26 01/28/2014 0510   ALT 28 01/10/2012 1205   BILITOT 0.39 01/19/2015 0849   BILITOT 0.4 01/28/2014 0510   BILITOT 0.60 01/10/2012 1205       RADIOGRAPHIC STUDIES: Ct Abdomen Pelvis Wo Contrast  12/23/2014   ADDENDUM REPORT: 12/23/2014 08:31  ADDENDUM: Critical Value/emergent results were called by telephone at the time of interpretation on 12/22/2014 at 11:30 am to Dr. [name] , who verbally acknowledged these results.   Electronically Signed   By: Camie Patience M.D.   On: 12/23/2014 08:31   12/23/2014   CLINICAL DATA:  History of non-small cell lung cancer status post right middle and lower lobectomies. Undergoing chemotherapy. Subsequent encounter.  EXAM: CT CHEST, ABDOMEN AND PELVIS WITHOUT CONTRAST  TECHNIQUE: Multidetector CT imaging of the chest, abdomen and pelvis was performed following the standard protocol without IV contrast.  COMPARISON:  CTs 10/15/2014 and 09/17/2014.  PET-CT 09/30/2014.  FINDINGS: CT CHEST FINDINGS  Mediastinum: Although the hilar disease is suboptimally evaluated without contrast, there has been significant improvement in the previously demonstrated left hilar mass and mediastinal lymphadenopathy. A previously demonstrated hypermetabolic AP window node measuring 1.9 cm now measures 8 mm on image 27. A 1.8 cm high left paratracheal node on the prior study has nearly completely resolved. A 9 mm high right paratracheal node on image number 11 is only slightly smaller, although was not hypermetabolic on PET-CT. The thyroid gland, trachea and esophagus demonstrate no significant findings. The heart size is normal. There is no pericardial effusion.There is diffuse atherosclerosis of the aorta, great vessels and coronary  arteries.  Lungs/Pleura: There is stable volume loss in the right hemithorax status post right middle and lower lobe resection. Bronchiectasis, scarring and opacity dependently in the right upper lobe and diffuse emphysematous changes are stable. There is a new small right-sided pneumothorax anteriorly.The pleural placed metastases noted on the prior examination have resolved. However, there is new ill-defined 10 mm nodule in the superior segment of the left lower lobe on image number 25. Emphysema and scattered subpleural fibrosis are present in both lungs.  Musculoskeletal/Chest wall: There are stable right thoracotomy changes. T7 compression fracture appears unchanged. No suspicious osseous lesions demonstrated in the chest.  CT ABDOMEN AND PELVIS FINDINGS  Hepatobiliary: Hepatic metastatic disease has improved. The residual lesions are smaller and lower in density, including a 1.4 cm lesion in the dome of the right lobe on image 40 and a 1.1 cm lesion anteriorly in the right lobe on image 47. No evidence of gallstones, gallbladder wall thickening or biliary dilatation.  Pancreas: Unremarkable. No pancreatic ductal dilatation or surrounding inflammatory changes.  Spleen: Normal in size without focal abnormality.  Adrenals/Urinary Tract: Both adrenal glands appear normal.The kidneys appear normal without evidence of urinary tract calculus or hydronephrosis. No bladder abnormalities are seen.  Stomach/Bowel: No evidence of bowel wall thickening, distention or surrounding inflammatory change.No ascites or focal extraluminal fluid collection.  Vascular/Lymphatic: There are no enlarged abdominal or pelvic lymph nodes. There is stable aortoiliac atherosclerosis.  Reproductive: Stable mild enlargement of the prostate gland.  Other: No evidence of abdominal wall mass or hernia.  Musculoskeletal: No progressive osseous metastatic disease or pathologic fracture demonstrated. 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 There is some sclerosis in the left femoral head is well which could reflect early AVN.  IMPRESSION: 1. Interval significant improvement in previously demonstrated metastatic disease to multiple mediastinal/left hilar lymph nodes and the left pleural space. 2. Interval improvement in hepatic metastatic disease. 3. No progressive osseous metastases or new pathologic fractures identified. 4. New small right-sided pneumothorax, unlikely to be clinically significant given the previous partial lung resection and probable associated pleural scarring. 5. New ill-defined sub solid nodule in the superior segment of the left lower lobe. This is potentially inflammatory. Attention on follow-up recommended.  Electronically Signed: By: Camie Patience M.D. On: 12/22/2014 10:08   Ct Chest Wo Contrast  12/23/2014   ADDENDUM REPORT: 12/23/2014 08:31  ADDENDUM: Critical Value/emergent results were called by telephone at the time of interpretation on 12/22/2014 at 11:30 am to Dr. [name] , who verbally acknowledged these results.   Electronically Signed   By: Camie Patience M.D.   On: 12/23/2014 08:31   12/23/2014   CLINICAL DATA:  History of non-small cell lung cancer status post right middle and lower lobectomies. Undergoing chemotherapy. Subsequent encounter.  EXAM: CT CHEST, ABDOMEN AND PELVIS WITHOUT CONTRAST  TECHNIQUE: Multidetector CT imaging of the chest, abdomen and pelvis was performed following the standard protocol without IV contrast.  COMPARISON:  CTs 10/15/2014 and 09/17/2014.  PET-CT 09/30/2014.  FINDINGS: CT CHEST FINDINGS  Mediastinum: Although the hilar disease is suboptimally evaluated without contrast, there has been significant improvement in the previously demonstrated left hilar mass and mediastinal lymphadenopathy. A previously demonstrated hypermetabolic AP window node measuring 1.9 cm now measures 8 mm on image  27. A 1.8 cm high left paratracheal node on the prior study has nearly completely resolved. A 9 mm high right paratracheal node on image number 11 is only slightly smaller, although was not hypermetabolic on PET-CT. The thyroid gland, trachea and esophagus demonstrate no significant findings. The heart size is normal. There is no pericardial effusion.There is diffuse atherosclerosis of the aorta, great vessels and coronary arteries.  Lungs/Pleura: There is stable volume loss in the right hemithorax status post right middle and lower lobe resection. Bronchiectasis, scarring and opacity dependently in the right upper lobe and diffuse emphysematous changes are stable. There is a new small right-sided pneumothorax  anteriorly.The pleural placed metastases noted on the prior examination have resolved. However, there is new ill-defined 10 mm nodule in the superior segment of the left lower lobe on image number 25. Emphysema and scattered subpleural fibrosis are present in both lungs.  Musculoskeletal/Chest wall: There are stable right thoracotomy changes. T7 compression fracture appears unchanged. No suspicious osseous lesions demonstrated in the chest.  CT ABDOMEN AND PELVIS FINDINGS  Hepatobiliary: Hepatic metastatic disease has improved. The residual lesions are smaller and lower in density, including a 1.4 cm lesion in the dome of the right lobe on image 40 and a 1.1 cm lesion anteriorly in the right lobe on image 47. No evidence of gallstones, gallbladder wall thickening or biliary dilatation.  Pancreas: Unremarkable. No pancreatic ductal dilatation or surrounding inflammatory changes.  Spleen: Normal in size without focal abnormality.  Adrenals/Urinary Tract: Both adrenal glands appear normal.The kidneys appear normal without evidence of urinary tract calculus or hydronephrosis. No bladder abnormalities are seen.  Stomach/Bowel: No evidence of bowel wall thickening, distention or surrounding inflammatory change.No  ascites or focal extraluminal fluid collection.  Vascular/Lymphatic: There are no enlarged abdominal or pelvic lymph nodes. There is stable aortoiliac atherosclerosis.  Reproductive: Stable mild enlargement of the prostate gland.  Other: No evidence of abdominal wall mass or hernia.  Musculoskeletal: No progressive osseous metastatic disease or pathologic fracture demonstrated. 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 There is some sclerosis in the left femoral head is well which could reflect early AVN.  IMPRESSION: 1. Interval significant improvement in previously demonstrated metastatic disease to multiple mediastinal/left hilar lymph nodes and the left pleural space. 2. Interval improvement in hepatic metastatic disease. 3. No progressive osseous metastases or new pathologic fractures identified. 4. New small right-sided pneumothorax, unlikely to be clinically significant given the previous partial lung resection and probable associated pleural scarring. 5. New ill-defined sub solid nodule in the superior segment of the left lower lobe. This is potentially inflammatory. Attention on follow-up recommended.  Electronically Signed: By: Camie Patience M.D. On: 12/22/2014 10:08    ASSESSMENT AND PLAN: This is a very pleasant 74 years old white Todd with metastatic non-small cell lung cancer initially diagnosed as stage IIB/IIIa non-small cell lung cancer status post concurrent chemoradiation followed by right middle and right lower lobectomies and has been observation since July of 2002. He is currently on systemic chemotherapy with carboplatin and paclitaxel is status post 4 cycles and tolerating the treatment well except for fatigue and mild peripheral neuropathy. Patient was discussed with and also seen by Dr. Julien Nordmann. His platelet count is low again today at 30,000. Cycle #5 of his chemotherapy is due on 01/28/2015.  We will recheck his counts again and if his platelets have recovered we'll proceed with cycle #5 on 01/28/2015 as scheduled. If his platelet count has not recovered it will be postponed by 1 week with repeat labs. Patient is currently taking over-the-counter iron tablet as the prescription iron prescription was too expensive. He is currently taking 1 tablet daily. We have asked him to increase this to one tablet twice daily. He is to take a stool softener such as Colace or MiraLax daily to avoid issues with constipation in the future.  His recent CT scan revealed overall improvement in his disease. He will proceed with 3 more cycles of the same chemotherapy we will do another restaging CT scan after cycle #6. He will continue with weekly labs  in the interim. For the history of COPD, he will continue his current treatment with prednisone, albuterol, Symbicort as well as Xopenex. For the metastatic bone disease, the patient will continue on Xgeva injection every 4 weeks. He was advised to take calcium and vitamin D supplements on daily basis.  He was advised to call immediately if he has any concerning symptoms in the interval. The patient voices understanding of current disease status and treatment options and is in agreement with the current care plan.  All questions were answered. The patient knows to call the clinic with any problems, questions or concerns. We can certainly see the patient much sooner if necessary.  Carlton Adam, PA-C 01/19/2015  ADDENDUM: Hematology/Oncology Attending:  I had a face to face encounter with the patient today. I recommended his care plan. This is a very pleasant 74 years old white Todd with metastatic non-small cell lung cancer, undergoing systemic chemotherapy with carboplatin and paclitaxel is status post 4 cycles. He is tolerating his treatment well except for the persistent fatigue secondary to chemotherapy-induced anemia. The patient also continues to complain  of shortness of breath secondary to COPD and metastatic non-small cell lung cancer with small right-sided pneumothorax.  He was supposed to start cycle #5 of his treatment today but unfortunately his platelets count is low. We will reschedule his chemotherapy to next week. For the chemotherapy-induced anemia, he was advised to continue on oral iron tablets.  He would come back for follow-up visit with the start of cycle #6. He was advised to call immediately if he has any concerning symptoms in the interval.  Disclaimer: This note was dictated with voice recognition software. Similar sounding words can inadvertently be transcribed and may be missed upon review. [name], MD 01/19/2015

## 2015-01-19 NOTE — Telephone Encounter (Signed)
Called patient about his Lasix. Below is how the patient has Lasix ordered.   furosemide (LASIX) 40 MG tablet Take 1 tablet (40 mg total) by mouth daily. Take as directed for the next 3 days.         Patient needs clarification on if he can just take this medication for three days and then be off for four. Patient is having trouble with nocturia and patient is taking the Lasix daily in the mornings. He would like to cut back, if he can. Patient does have some swelling in BLE, but states this is due to Prednisone 20 mg  that he takes daily. Will forward to Dr. Marlou Porch for further instructions.

## 2015-01-20 ENCOUNTER — Other Ambulatory Visit: Payer: 59

## 2015-01-20 ENCOUNTER — Ambulatory Visit: Payer: 59 | Admitting: Internal Medicine

## 2015-01-20 ENCOUNTER — Telehealth: Payer: Self-pay | Admitting: *Deleted

## 2015-01-20 ENCOUNTER — Telehealth: Payer: Self-pay | Admitting: Internal Medicine

## 2015-01-20 NOTE — Telephone Encounter (Signed)
S/w pt confirming labs/chemo added on 02/24 adjusted to his time to be able to come in..... KJ

## 2015-01-20 NOTE — Telephone Encounter (Signed)
Pt aware and states understanding.  Will call back if any further questions or concerns.

## 2015-01-20 NOTE — Telephone Encounter (Signed)
Per staff message and POF I have scheduled appts. Advised scheduler of appts. JMW  

## 2015-01-21 ENCOUNTER — Other Ambulatory Visit: Payer: 59

## 2015-01-21 ENCOUNTER — Ambulatory Visit: Payer: 59

## 2015-01-21 ENCOUNTER — Ambulatory Visit: Payer: 59 | Admitting: Internal Medicine

## 2015-01-28 ENCOUNTER — Other Ambulatory Visit: Payer: Self-pay | Admitting: Internal Medicine

## 2015-01-28 ENCOUNTER — Other Ambulatory Visit (HOSPITAL_BASED_OUTPATIENT_CLINIC_OR_DEPARTMENT_OTHER): Payer: 59

## 2015-01-28 ENCOUNTER — Other Ambulatory Visit: Payer: 59

## 2015-01-28 ENCOUNTER — Telehealth: Payer: Self-pay | Admitting: Medical Oncology

## 2015-01-28 ENCOUNTER — Ambulatory Visit (HOSPITAL_BASED_OUTPATIENT_CLINIC_OR_DEPARTMENT_OTHER): Payer: 59

## 2015-01-28 DIAGNOSIS — C3431 Malignant neoplasm of lower lobe, right bronchus or lung: Secondary | ICD-10-CM

## 2015-01-28 DIAGNOSIS — Z5111 Encounter for antineoplastic chemotherapy: Secondary | ICD-10-CM

## 2015-01-28 DIAGNOSIS — D6481 Anemia due to antineoplastic chemotherapy: Secondary | ICD-10-CM

## 2015-01-28 DIAGNOSIS — C7951 Secondary malignant neoplasm of bone: Secondary | ICD-10-CM

## 2015-01-28 LAB — CBC WITH DIFFERENTIAL/PLATELET
BASO%: 0.2 % (ref 0.0–2.0)
Basophils Absolute: 0 10*3/uL (ref 0.0–0.1)
EOS%: 0.4 % (ref 0.0–7.0)
Eosinophils Absolute: 0 10*3/uL (ref 0.0–0.5)
HCT: 25.3 % — ABNORMAL LOW (ref 38.4–49.9)
HGB: 8.4 g/dL — ABNORMAL LOW (ref 13.0–17.1)
LYMPH#: 1.3 10*3/uL (ref 0.9–3.3)
LYMPH%: 23.4 % (ref 14.0–49.0)
MCH: 34 pg — ABNORMAL HIGH (ref 27.2–33.4)
MCHC: 33.2 g/dL (ref 32.0–36.0)
MCV: 102.4 fL — ABNORMAL HIGH (ref 79.3–98.0)
MONO#: 0.7 10*3/uL (ref 0.1–0.9)
MONO%: 13.1 % (ref 0.0–14.0)
NEUT#: 3.4 10*3/uL (ref 1.5–6.5)
NEUT%: 62.9 % (ref 39.0–75.0)
NRBC: 0 % (ref 0–0)
Platelets: 92 10*3/uL — ABNORMAL LOW (ref 140–400)
RBC: 2.47 10*6/uL — AB (ref 4.20–5.82)
RDW: 20.9 % — ABNORMAL HIGH (ref 11.0–14.6)
WBC: 5.4 10*3/uL (ref 4.0–10.3)

## 2015-01-28 LAB — COMPREHENSIVE METABOLIC PANEL (CC13)
ALBUMIN: 3.3 g/dL — AB (ref 3.5–5.0)
ALK PHOS: 115 U/L (ref 40–150)
ALT: 34 U/L (ref 0–55)
AST: 28 U/L (ref 5–34)
Anion Gap: 11 mEq/L (ref 3–11)
BUN: 44.2 mg/dL — ABNORMAL HIGH (ref 7.0–26.0)
CO2: 20 meq/L — AB (ref 22–29)
Calcium: 8.3 mg/dL — ABNORMAL LOW (ref 8.4–10.4)
Chloride: 110 mEq/L — ABNORMAL HIGH (ref 98–109)
Creatinine: 1.6 mg/dL — ABNORMAL HIGH (ref 0.7–1.3)
EGFR: 42 mL/min/{1.73_m2} — ABNORMAL LOW (ref >90)
Glucose: 102 mg/dl (ref 70–140)
Potassium: 4.1 mEq/L (ref 3.5–5.1)
SODIUM: 141 meq/L (ref 136–145)
TOTAL PROTEIN: 6.3 g/dL — AB (ref 6.4–8.3)
Total Bilirubin: 0.35 mg/dL (ref 0.20–1.20)

## 2015-01-28 MED ORDER — DIPHENHYDRAMINE HCL 50 MG/ML IJ SOLN
50.0000 mg | Freq: Once | INTRAMUSCULAR | Status: AC
  Administered 2015-01-28: 50 mg via INTRAVENOUS

## 2015-01-28 MED ORDER — DEXAMETHASONE SODIUM PHOSPHATE 20 MG/5ML IJ SOLN
20.0000 mg | Freq: Once | INTRAMUSCULAR | Status: AC
  Administered 2015-01-28: 20 mg via INTRAVENOUS

## 2015-01-28 MED ORDER — DENOSUMAB 120 MG/1.7ML ~~LOC~~ SOLN
120.0000 mg | Freq: Once | SUBCUTANEOUS | Status: AC
  Administered 2015-01-28: 120 mg via SUBCUTANEOUS
  Filled 2015-01-28: qty 1.7

## 2015-01-28 MED ORDER — DIPHENHYDRAMINE HCL 50 MG/ML IJ SOLN
INTRAMUSCULAR | Status: AC
  Filled 2015-01-28: qty 1

## 2015-01-28 MED ORDER — SODIUM CHLORIDE 0.9 % IV SOLN
Freq: Once | INTRAVENOUS | Status: AC
  Administered 2015-01-28: 10:00:00 via INTRAVENOUS

## 2015-01-28 MED ORDER — ONDANSETRON 16 MG/50ML IVPB (CHCC)
INTRAVENOUS | Status: AC
  Filled 2015-01-28: qty 16

## 2015-01-28 MED ORDER — FAMOTIDINE IN NACL 20-0.9 MG/50ML-% IV SOLN
20.0000 mg | Freq: Once | INTRAVENOUS | Status: AC
  Administered 2015-01-28: 20 mg via INTRAVENOUS

## 2015-01-28 MED ORDER — FAMOTIDINE IN NACL 20-0.9 MG/50ML-% IV SOLN
INTRAVENOUS | Status: AC
  Filled 2015-01-28: qty 50

## 2015-01-28 MED ORDER — DEXAMETHASONE SODIUM PHOSPHATE 20 MG/5ML IJ SOLN
INTRAMUSCULAR | Status: AC
  Filled 2015-01-28: qty 5

## 2015-01-28 MED ORDER — ONDANSETRON 16 MG/50ML IVPB (CHCC)
16.0000 mg | Freq: Once | INTRAVENOUS | Status: AC
  Administered 2015-01-28: 16 mg via INTRAVENOUS

## 2015-01-28 MED ORDER — PACLITAXEL CHEMO INJECTION 300 MG/50ML
150.0000 mg/m2 | Freq: Once | INTRAVENOUS | Status: AC
  Administered 2015-01-28: 300 mg via INTRAVENOUS
  Filled 2015-01-28: qty 50

## 2015-01-28 MED ORDER — SODIUM CHLORIDE 0.9 % IV SOLN
300.0000 mg | Freq: Once | INTRAVENOUS | Status: AC
  Administered 2015-01-28: 300 mg via INTRAVENOUS
  Filled 2015-01-28: qty 30

## 2015-01-28 MED ORDER — SODIUM CHLORIDE 0.9 % IV SOLN: 382.0000 mg | Freq: Once | INTRAVENOUS | Status: DC

## 2015-01-28 NOTE — Patient Instructions (Signed)
[facility] Discharge Instructions for Patients Receiving Chemotherapy  Today you received the following chemotherapy agents Paclitaxel/Carboplatin.   To help prevent nausea and vomiting after your treatment, we encourage you to take your nausea medication as directed.    If you develop nausea and vomiting that is not controlled by your nausea medication, call the clinic.   BELOW ARE SYMPTOMS THAT SHOULD BE REPORTED IMMEDIATELY:  *FEVER GREATER THAN 100.5 F  *CHILLS WITH OR WITHOUT FEVER  NAUSEA AND VOMITING THAT IS NOT CONTROLLED WITH YOUR NAUSEA MEDICATION  *UNUSUAL SHORTNESS OF BREATH  *UNUSUAL BRUISING OR BLEEDING  TENDERNESS IN MOUTH AND THROAT WITH OR WITHOUT PRESENCE OF ULCERS  *URINARY PROBLEMS  *BOWEL PROBLEMS  UNUSUAL RASH Items with * indicate a potential emergency and should be followed up as soon as possible.  Feel free to call the clinic you have any questions or concerns. The clinic phone number is [phone].    

## 2015-01-28 NOTE — Progress Notes (Signed)
Per Dr Julien Nordmann he will reduce dose of both chemotherapy and then okay to treat with chemo with today's labs

## 2015-01-28 NOTE — Telephone Encounter (Signed)
Requests we fix diagnoses on AVS to reflect his lung cancer is now on left side. I explained to pt that initial dx is what shows on AVS

## 2015-01-28 NOTE — Progress Notes (Signed)
Ok to treat with CBC/CMET. Dose change of chemotherapy.

## 2015-01-29 ENCOUNTER — Telehealth: Payer: Self-pay | Admitting: *Deleted

## 2015-01-29 ENCOUNTER — Ambulatory Visit (HOSPITAL_BASED_OUTPATIENT_CLINIC_OR_DEPARTMENT_OTHER): Payer: 59

## 2015-01-29 DIAGNOSIS — C3431 Malignant neoplasm of lower lobe, right bronchus or lung: Secondary | ICD-10-CM

## 2015-01-29 DIAGNOSIS — Z5189 Encounter for other specified aftercare: Secondary | ICD-10-CM

## 2015-01-29 MED ORDER — PEGFILGRASTIM INJECTION 6 MG/0.6ML ~~LOC~~
6.0000 mg | PREFILLED_SYRINGE | Freq: Once | SUBCUTANEOUS | Status: AC
  Administered 2015-01-29: 6 mg via SUBCUTANEOUS
  Filled 2015-01-29: qty 0.6

## 2015-01-29 NOTE — Telephone Encounter (Signed)
Patient called reporting his "left arm id black and blue after chemotherapy yesterday.  Can someone look at this when I come in today at 3:15 for my injection?  They stuck me three times in the left arm yesterday.  Called last night and was told to go to the ER because the medication I received could do something to my arm.  I refused to go to the ER."  Will notify staff.  Can't drive so unable to come in earlier.  Works at [phone]. Wife will pick him up at 3:00 to drive him.

## 2015-01-29 NOTE — Telephone Encounter (Signed)
Why don''t you put him on to see Cyndee??

## 2015-01-29 NOTE — Telephone Encounter (Signed)
Radio broadcast assistant notified of patient call and concern.  Patient will be evaluated in treatment infusion area for arm assessment S/P difficult IV start on yesterday.

## 2015-01-30 NOTE — Progress Notes (Signed)
Patient called [facility] last pm to report he noted a large "blue" area on his left arm when he got home from having his chemo treatment.   Per our on call physician, Dr. Alvy Bimler, nurse to follow up 2/25 when he returns for injection appointment.  I noted area approximately 4x4 inches in size on his left forearm.  There was no swelling or tenderness noted.  It was not warm to touch.  He stated when he got home he noticed the coban dressing was saturated with blood.  He also stated he bleeds easily because he takes one baby aspirin everyday because he has three cardiac stents. I reassured him this would resolve but would take several weeks. I explained he had basically just bled under the skin at the IV insertion site.  I talked about making sure he or the nurse held firm pressure for 5 minutes next time he comes for treatment.  I also spoke with treatment nurse Darden Dates who stated there had been no problems with his IV site during his treatment.

## 2015-02-02 ENCOUNTER — Telehealth: Payer: Self-pay | Admitting: *Deleted

## 2015-02-02 NOTE — Telephone Encounter (Signed)
THIS NOTE ROUTED TO DR.MOHAMED'S NURSE, DIANE BELL,RN.

## 2015-02-02 NOTE — Telephone Encounter (Signed)
Requests FML for periodic leave for several days after chemo adn intermittent when not feeling well. He will bring FMLA paper to me.

## 2015-02-03 ENCOUNTER — Encounter (INDEPENDENT_AMBULATORY_CARE_PROVIDER_SITE_OTHER): Payer: 59 | Admitting: Ophthalmology

## 2015-02-03 ENCOUNTER — Encounter: Payer: Self-pay | Admitting: Internal Medicine

## 2015-02-03 NOTE — Progress Notes (Signed)
Put fmla/disabl forms on Dr. Worthy Flank nurse's desk

## 2015-02-04 ENCOUNTER — Encounter: Payer: Self-pay | Admitting: Internal Medicine

## 2015-02-04 NOTE — Progress Notes (Signed)
Faxing fmla/disability forms to [phone]

## 2015-02-06 ENCOUNTER — Inpatient Hospital Stay (HOSPITAL_COMMUNITY)
Admission: EM | Admit: 2015-02-06 | Discharge: 2015-02-07 | DRG: 812 | Disposition: A | Discharge status: Home / Self Care | Payer: 59 | Attending: Internal Medicine | Admitting: Internal Medicine

## 2015-02-06 ENCOUNTER — Telehealth (HOSPITAL_COMMUNITY): Payer: Self-pay | Admitting: *Deleted

## 2015-02-06 ENCOUNTER — Emergency Department (HOSPITAL_COMMUNITY): Payer: 59

## 2015-02-06 ENCOUNTER — Encounter (HOSPITAL_COMMUNITY): Payer: Self-pay | Admitting: Emergency Medicine

## 2015-02-06 ENCOUNTER — Encounter (HOSPITAL_COMMUNITY): Payer: Self-pay | Admitting: *Deleted

## 2015-02-06 DIAGNOSIS — Z923 Personal history of irradiation: Secondary | ICD-10-CM

## 2015-02-06 DIAGNOSIS — C3490 Malignant neoplasm of unspecified part of unspecified bronchus or lung: Secondary | ICD-10-CM | POA: Diagnosis present

## 2015-02-06 DIAGNOSIS — D6481 Anemia due to antineoplastic chemotherapy: Secondary | ICD-10-CM | POA: Diagnosis present

## 2015-02-06 DIAGNOSIS — Z955 Presence of coronary angioplasty implant and graft: Secondary | ICD-10-CM

## 2015-02-06 DIAGNOSIS — N183 Chronic kidney disease, stage 3 unspecified: Secondary | ICD-10-CM

## 2015-02-06 DIAGNOSIS — R0600 Dyspnea, unspecified: Secondary | ICD-10-CM

## 2015-02-06 DIAGNOSIS — R0602 Shortness of breath: Secondary | ICD-10-CM | POA: Diagnosis present

## 2015-02-06 DIAGNOSIS — I255 Ischemic cardiomyopathy: Secondary | ICD-10-CM | POA: Diagnosis present

## 2015-02-06 DIAGNOSIS — N179 Acute kidney failure, unspecified: Secondary | ICD-10-CM | POA: Diagnosis present

## 2015-02-06 DIAGNOSIS — E875 Hyperkalemia: Secondary | ICD-10-CM | POA: Diagnosis present

## 2015-02-06 DIAGNOSIS — D649 Anemia, unspecified: Secondary | ICD-10-CM | POA: Diagnosis not present

## 2015-02-06 DIAGNOSIS — E785 Hyperlipidemia, unspecified: Secondary | ICD-10-CM | POA: Diagnosis present

## 2015-02-06 DIAGNOSIS — I5022 Chronic systolic (congestive) heart failure: Secondary | ICD-10-CM | POA: Diagnosis present

## 2015-02-06 DIAGNOSIS — Z888 Allergy status to other drugs, medicaments and biological substances status: Secondary | ICD-10-CM

## 2015-02-06 DIAGNOSIS — I2510 Atherosclerotic heart disease of native coronary artery without angina pectoris: Secondary | ICD-10-CM | POA: Diagnosis present

## 2015-02-06 DIAGNOSIS — D72829 Elevated white blood cell count, unspecified: Secondary | ICD-10-CM | POA: Diagnosis present

## 2015-02-06 DIAGNOSIS — Z87891 Personal history of nicotine dependence: Secondary | ICD-10-CM | POA: Diagnosis not present

## 2015-02-06 DIAGNOSIS — Z885 Allergy status to narcotic agent status: Secondary | ICD-10-CM | POA: Diagnosis not present

## 2015-02-06 DIAGNOSIS — I129 Hypertensive chronic kidney disease with stage 1 through stage 4 chronic kidney disease, or unspecified chronic kidney disease: Secondary | ICD-10-CM | POA: Diagnosis present

## 2015-02-06 DIAGNOSIS — J449 Chronic obstructive pulmonary disease, unspecified: Secondary | ICD-10-CM | POA: Diagnosis present

## 2015-02-06 DIAGNOSIS — E872 Acidosis, unspecified: Secondary | ICD-10-CM

## 2015-02-06 DIAGNOSIS — J439 Emphysema, unspecified: Secondary | ICD-10-CM | POA: Diagnosis present

## 2015-02-06 HISTORY — DX: Unspecified systolic (congestive) heart failure: I50.20

## 2015-02-06 LAB — CBC WITH DIFFERENTIAL/PLATELET
BASOS ABS: 0 10*3/uL (ref 0.0–0.1)
Basophils Relative: 0 % (ref 0–1)
EOS ABS: 0 10*3/uL (ref 0.0–0.7)
Eosinophils Relative: 0 % (ref 0–5)
HEMATOCRIT: 23.3 % — AB (ref 39.0–52.0)
Hemoglobin: 7.6 g/dL — ABNORMAL LOW (ref 13.0–17.0)
LYMPHS ABS: 1.7 10*3/uL (ref 0.7–4.0)
Lymphocytes Relative: 7 % — ABNORMAL LOW (ref 12–46)
MCH: 34.5 pg — AB (ref 26.0–34.0)
MCHC: 32.6 g/dL (ref 30.0–36.0)
MCV: 105.9 fL — AB (ref 78.0–100.0)
MONO ABS: 0.7 10*3/uL (ref 0.1–1.0)
Monocytes Relative: 3 % (ref 3–12)
NEUTROS ABS: 21.2 10*3/uL — AB (ref 1.7–7.7)
NRBC: 1 /100{WBCs} — AB
Neutrophils Relative %: 90 % — ABNORMAL HIGH (ref 43–77)
PLATELETS: 47 10*3/uL — AB (ref 150–400)
RBC: 2.2 MIL/uL — ABNORMAL LOW (ref 4.22–5.81)
RDW: 21.2 % — AB (ref 11.5–15.5)
WBC: 23.6 10*3/uL — AB (ref 4.0–10.5)

## 2015-02-06 LAB — POC OCCULT BLOOD, ED: FECAL OCCULT BLD: NEGATIVE

## 2015-02-06 LAB — COMPREHENSIVE METABOLIC PANEL
ALBUMIN: 3.6 g/dL (ref 3.5–5.2)
ALT: 42 U/L (ref 0–53)
AST: 31 U/L (ref 0–37)
Alkaline Phosphatase: 146 U/L — ABNORMAL HIGH (ref 39–117)
Anion gap: 12 (ref 5–15)
BILIRUBIN TOTAL: 0.6 mg/dL (ref 0.3–1.2)
BUN: 51 mg/dL — ABNORMAL HIGH (ref 6–23)
CHLORIDE: 108 mmol/L (ref 96–112)
CO2: 18 mmol/L — AB (ref 19–32)
Calcium: 8.3 mg/dL — ABNORMAL LOW (ref 8.4–10.5)
Creatinine, Ser: 1.97 mg/dL — ABNORMAL HIGH (ref 0.50–1.35)
GFR calc Af Amer: 38 mL/min — ABNORMAL LOW (ref >90)
GFR, EST NON AFRICAN AMERICAN: 32 mL/min — AB (ref >90)
Glucose, Bld: 167 mg/dL — ABNORMAL HIGH (ref 70–99)
Potassium: 5.6 mmol/L — ABNORMAL HIGH (ref 3.5–5.1)
SODIUM: 138 mmol/L (ref 135–145)
Total Protein: 6.5 g/dL (ref 6.0–8.3)

## 2015-02-06 LAB — I-STAT TROPONIN, ED: Troponin i, poc: 0.03 ng/mL (ref 0.00–0.08)

## 2015-02-06 LAB — PREPARE RBC (CROSSMATCH)

## 2015-02-06 LAB — PATHOLOGIST SMEAR REVIEW

## 2015-02-06 LAB — APTT: APTT: 30 s (ref 24–37)

## 2015-02-06 LAB — PROTIME-INR
INR: 1.05 (ref 0.00–1.49)
Prothrombin Time: 13.8 seconds (ref 11.6–15.2)

## 2015-02-06 MED ORDER — SODIUM BICARBONATE 650 MG PO TABS
650.0000 mg | ORAL_TABLET | Freq: Two times a day (BID) | ORAL | Status: DC
  Administered 2015-02-06 – 2015-02-07 (×2): 650 mg via ORAL
  Filled 2015-02-06 (×3): qty 1

## 2015-02-06 MED ORDER — PREDNISONE 20 MG PO TABS
20.0000 mg | ORAL_TABLET | Freq: Every day | ORAL | Status: DC
  Administered 2015-02-07: 20 mg via ORAL
  Filled 2015-02-06: qty 1

## 2015-02-06 MED ORDER — ALBUTEROL SULFATE (2.5 MG/3ML) 0.083% IN NEBU
2.5000 mg | INHALATION_SOLUTION | Freq: Four times a day (QID) | RESPIRATORY_TRACT | Status: DC | PRN
  Administered 2015-02-07: 2.5 mg via RESPIRATORY_TRACT
  Filled 2015-02-06: qty 3

## 2015-02-06 MED ORDER — SODIUM CHLORIDE 0.9 % IV BOLUS (SEPSIS)
1000.0000 mL | Freq: Once | INTRAVENOUS | Status: AC
  Administered 2015-02-06: 1000 mL via INTRAVENOUS

## 2015-02-06 MED ORDER — SODIUM CHLORIDE 0.9 % IV SOLN: 250.0000 mL | INTRAVENOUS | Status: DC | PRN

## 2015-02-06 MED ORDER — BUDESONIDE-FORMOTEROL FUMARATE 160-4.5 MCG/ACT IN AERO
2.0000 | INHALATION_SPRAY | Freq: Two times a day (BID) | RESPIRATORY_TRACT | Status: DC
  Administered 2015-02-06 – 2015-02-07 (×2): 2 via RESPIRATORY_TRACT
  Filled 2015-02-06: qty 6

## 2015-02-06 MED ORDER — SODIUM POLYSTYRENE SULFONATE 15 GM/60ML PO SUSP
15.0000 g | Freq: Once | ORAL | Status: AC
  Administered 2015-02-06: 15 g via ORAL
  Filled 2015-02-06: qty 60

## 2015-02-06 MED ORDER — PANTOPRAZOLE SODIUM 40 MG PO TBEC
40.0000 mg | DELAYED_RELEASE_TABLET | Freq: Every day | ORAL | Status: DC
  Administered 2015-02-07: 40 mg via ORAL
  Filled 2015-02-06: qty 1

## 2015-02-06 MED ORDER — SODIUM CHLORIDE 0.9 % IJ SOLN
3.0000 mL | Freq: Two times a day (BID) | INTRAMUSCULAR | Status: DC
Start: 2015-02-06 — End: 2015-02-07
  Administered 2015-02-06 – 2015-02-07 (×2): 3 mL via INTRAVENOUS

## 2015-02-06 MED ORDER — SODIUM CHLORIDE 0.9 % IV SOLN: INTRAVENOUS | Status: DC

## 2015-02-06 MED ORDER — SODIUM CHLORIDE 0.9 % IV SOLN: 10.0000 mL/h | Freq: Once | INTRAVENOUS | Status: DC

## 2015-02-06 MED ORDER — ALBUTEROL SULFATE 108 (90 BASE) MCG/ACT IN AEPB: 2.0000 | INHALATION_SPRAY | Freq: Four times a day (QID) | RESPIRATORY_TRACT | Status: DC | PRN

## 2015-02-06 MED ORDER — SODIUM CHLORIDE 0.9 % IJ SOLN: 3.0000 mL | INTRAMUSCULAR | Status: DC | PRN

## 2015-02-06 MED ORDER — ALPRAZOLAM 0.25 MG PO TABS: 0.2500 mg | ORAL_TABLET | Freq: Two times a day (BID) | ORAL | Status: DC | PRN

## 2015-02-06 MED ORDER — CITALOPRAM HYDROBROMIDE 20 MG PO TABS
20.0000 mg | ORAL_TABLET | Freq: Every day | ORAL | Status: DC
  Administered 2015-02-06 – 2015-02-07 (×2): 20 mg via ORAL
  Filled 2015-02-06 (×2): qty 1

## 2015-02-06 NOTE — Telephone Encounter (Signed)
Fax received with 2 unit blood transfusion order. RN called, msg left. Dr [name] called back to discuss pt's status. Dr.[name] notified about the work days/hours of [facility] and usual amount of time needed to type/screen and transfuse. Todd Villanueva, Todd Villanueva

## 2015-02-06 NOTE — ED Notes (Signed)
Attempted report x 2 

## 2015-02-06 NOTE — H&P (Addendum)
History and Physical  Todd Villanueva QQV:[id number] DOB: 10-27-1941 DOA: 02/06/2015  Referring physician: Dr. Leonides Schanz in ED PCP: [name], MD  Oncologist: Dr. [name] Pulmonologist: [name]  Chief Complaint: Short of breath  HPI:  74 year old male currently undergoing chemotherapy for lung cancer with last treatment 2/25 present to the emergency department with increasing shortness of breath and dyspnea on exertion, generalized weakness and fatigue. Initial evaluation revealed anemia, acute on chronic renal failure with modest hyperkalemia. He was referred for observation for symptomatic anemia.  History obtained from patient and wife at bedside. He has had several instances of symptomatic anemia in the past with chemotherapy. He developed similar symptoms again with exertional dyspnea and fatigue. No chest pain. No fever. No systemic symptoms. No other complaints at this time. His oral intake has been somewhat poor. He said no uremic symptoms.  Of note he did receive Neulasta in the last week per wife.  In the emergency department afebrile, tachycardic, normotensive, no hypoxia. Basic metabolic panel revealed mild hyperkalemia, CO2 18, BUN elevated 51, creatinine above baseline 1.97. WBC 23.6, hemoglobin 7.6, platelet count pending. Chest x-ray with persistent consolidation and volume loss at the right base. No new opacity. No changes seen. EKG nuclear view chest sinus tachycardia, lateral T-wave inversion, consider ischemia. Troponin negative.   Review of Systems:  Negative for fever, visual changes, sore throat, rash, new muscle aches, chest pain, dysuria, bleeding, n/v/abdominal pain.  Past Medical History  Diagnosis Date  . Hypertension   . lung ca dx'd 02/2011    xrt/chemo comp 03/30/11, stage IIIA  . Hyperlipidemia   . 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 History of angina   . CAD (coronary  artery disease)   . Systolic CHF     Past Surgical History  Procedure Laterality Date  . Right vats, rt thoracotomy, rt middle and rt lower  lobectomy  06/10/2011    Dr Arlyce Dice  . Knee arthroscopy    . Tonsillectomy    . Coronary stent placement  JULY,AUGUST 2011  . Orif femoral neck fracture w/ dhs    . Left heart catheterization with coronary angiogram N/A 06/17/2014    Procedure: LEFT HEART CATHETERIZATION WITH CORONARY ANGIOGRAM;  Surgeon: Candee Furbish, MD;  Location: [facility] CATH LAB;  Service: Cardiovascular;  Laterality: N/A;    Social History:  reports that he quit smoking about 16 months ago. His smoking use included Cigarettes. He has a 26.5 pack-year smoking history. He has never used smokeless tobacco. He reports that he drinks alcohol. He reports that he does not use illicit drugs.  Allergies  Allergen Reactions  . Hydrocodone-Acetaminophen Itching  . Morphine And Related Other (See Comments)    Hallucinations, headache  . Tramadol     Pt states will not take med b/c it does not work   . Zolpidem Tartrate Other (See Comments)    nightmares    Family History  Problem Relation Age of Onset  . Cancer Father     multiple myeloma  . COPD Sister     emphysema- smoked  . Cancer Brother     multiple myeloma     Prior to Admission medications   Medication Sig Start Date End Date Taking? Authorizing Provider  Albuterol Sulfate (PROAIR RESPICLICK) 332 (90 BASE) MCG/ACT AEPB Inhale 2 puffs into the lungs every 6 (six) hours as needed. 11/14/14   [name],  MD  ALPRAZolam (XANAX) 0.25 MG tablet Take 0.25 mg by mouth 2 (two) times daily as needed for anxiety.     Historical Provider, MD  Alum & Mag Hydroxide-Simeth (MAGIC MOUTHWASH W/LIDOCAINE) SOLN Take 5 mLs by mouth 4 (four) times daily as needed for mouth pain. 01/06/15   [name], MD  bevacizumab (AVASTIN) 1.25 mg/0.1 mL SOLN 1.25 mg by Intravitreal route every 30 (thirty) days.    Hayden Pedro, MD    budesonide-formoterol [facility]) 160-4.5 MCG/ACT inhaler Inhale 2 puffs into the lungs 2 (two) times daily.    Historical Provider, MD  Calcium Carbonate-Vitamin D (CALCIUM-VITAMIN D) 500-200 MG-UNIT per tablet Take 1 tablet by mouth 2 (two) times daily.    Historical Provider, MD  citalopram (CELEXA) 40 MG tablet Take 20 mg by mouth daily.  09/10/14   Historical Provider, MD  furosemide (LASIX) 40 MG tablet Take 1 tablet (40 mg total) by mouth daily. Take as directed for the next 3 days. 11/20/14   Candee Furbish, MD  GuaiFENesin (MUCINEX PO) Take 600 mg by mouth 2 (two) times daily. 11/24/14   Historical Provider, MD  HYDROmorphone (DILAUDID) 2 MG tablet Take 1 tablet (2 mg total) by mouth every 6 (six) hours as needed for severe pain. Patient not taking: Reported on 01/19/2015 10/07/14   [name], MD  levalbuterol Penne Lash) 0.63 MG/3ML nebulizer solution Take 0.63 mg by nebulization 2 (two) times daily.     Historical Provider, MD  lisinopril (PRINIVIL,ZESTRIL) 10 MG tablet Take 10 mg by mouth daily.    Historical Provider, MD  naproxen (NAPROSYN) 500 MG tablet Take 500 mg by mouth 2 (two) times daily with a meal.  07/28/14   Historical Provider, MD  omeprazole (PRILOSEC) 20 MG capsule Take 40 mg by mouth daily.     Historical Provider, MD  potassium chloride SA (K-DUR,KLOR-CON) 20 MEQ tablet Take 1 tablet (20 mEq total) by mouth once. Take as directed for the next 3 days 11/20/14   Candee Furbish, MD  predniSONE (DELTASONE) 10 MG tablet Take 20 mg by mouth daily.     Historical Provider, MD  prochlorperazine (COMPAZINE) 10 MG tablet Take 1 tablet (10 mg total) by mouth every 6 (six) hours as needed for nausea or vomiting. 10/16/14   [name], MD  simvastatin (ZOCOR) 40 MG tablet Take 40 mg by mouth every Monday, Wednesday, and Friday.     Historical Provider, MD  temazepam (RESTORIL) 30 MG capsule Take 30 mg by mouth at bedtime as needed for sleep.    Josetta Huddle, MD   Physical  Exam: Filed Vitals:   02/06/15 1322 02/06/15 1500 02/06/15 1515  BP: 109/58  102/66  Pulse: 122    Temp: 97.7 F (36.5 C)    TempSrc: Oral    Resp: _0 SpO2: 99% 100% 100%   General: Examined in the emergency department. Appears calm and comfortable Eyes: PERRL, normal lids, irises  ENT: grossly normal hearing, lips & tongue Neck: Appears grossly unremarkable Cardiovascular: RRR, no m/r/g. No LE edema. Respiratory: CTA bilaterally, no w/r/r. Normal respiratory effort. Abdomen: soft, ntnd Skin: no rash or induration noted Musculoskeletal: grossly normal tone BUE/BLE Psychiatric: grossly normal mood and affect, speech fluent and appropriate Neurologic: grossly non-focal.  Wt Readings from Last 3 Encounters:  01/19/15 81.511 kg (179 lb 11.2 oz)  12/31/14 83.961 kg (185 lb 1.6 oz)  12/25/14 84.369 kg (186 lb)    Labs on Admission:  Basic Metabolic Panel:  Recent Labs Lab 02/06/15 1420  NA 138  K 5.6*  CL 108  CO2 18*  GLUCOSE 167*  BUN 51*  CREATININE 1.97*  CALCIUM 8.3*    Liver Function Tests:  Recent Labs Lab 02/06/15 1420  AST 31  ALT 42  ALKPHOS 146*  BILITOT 0.6  PROT 6.5  ALBUMIN 3.6    CBC:  Recent Labs Lab 02/06/15 1420  WBC 23.6*  NEUTROABS 21.2*  HGB 7.6*  HCT 23.3*  MCV 105.9*  PLT PENDING     Recent Labs  02/06/15 1417  TROPIPOC 0.03   Radiological Exams on Admission: Dg Chest Port 1 View  02/06/2015   CLINICAL DATA:  Difficulty breathing and weakness  EXAM: PORTABLE CHEST - 1 VIEW  COMPARISON:  Chest radiograph February 01, 2014 and chest CT December 22, 2014  FINDINGS: There is chronic volume loss in the right middle and lower lung regions, stable. Left lung is clear. Heart size is within normal limits. No adenopathy.  IMPRESSION: Persistent consolidation and volume loss in the right mid and lower lung zones, stable. No new opacity. No change in cardiac silhouette.   Electronically Signed   By: Lowella Grip III M.D.    On: 02/06/2015 14:43    Principal Problem:   Symptomatic anemia Active Problems:   COPD with emphysema   Anemia   Cardiomyopathy, ischemic EF 35-40% by echo 01/28/14   Acute renal failure   CKD (chronic kidney disease), stage III   Hyperkalemia   Assessment/Plan 1. Symptomatic macrocytic anemia secondary to chemotherapy with associated shortness of breath and fatigue. Possibly complicated by Naprosyn. 2. Acute renal failure superimposed on chronic kidney disease stage III with non-metabolic acidosis and hyperkalemia. Complicated by lisinopril, Lasix and potassium supplementation. Secondary to poor oral intake. Treated with Kayexylate in the emergency department. Metabolic acidosis was also seen 01/28/2015. 3. Leukocytosis secondary to recent Neulasta injection.  4. Metastatic non-small lung cancer, status post radiation therapy, currently undergoing systemic chemotherapy with carboplatinum and paclitaxel 5. COPD appears stable. 6. Chronic systolic congestive heart failure, LVEF 35-40 percent. Grade 1 diastolic dysfunction. Appears compensated. EKG with some changes laterally but asymptomatic. 7. History of coronary artery disease.   Appears medically stable for admission to the medical floor.  Plan transfuse 2 units packed red blood cells  IV fluids. Expect CO2 will improve with hydration. Kayexalate given in the emergency department. Repeat basic metabolic panel in the morning.   EKG changes likely reflective of symptomatic anemia. Plan to cycle troponins. No chest pain. No evidence to suggest ACS.  Dr. Earlie Server notified of admission  Code Status: full code DVT prophylaxis: SCDs Family Communication: discussed with wife at bedside Disposition Plan/Anticipated LOS: obs, 24 hours  Time spent: 60 minutes  Murray Hodgkins, MD  [facility] Pager [phone] 02/06/2015, 3:25 PM

## 2015-02-06 NOTE — ED Notes (Signed)
Attempted report x1. 

## 2015-02-06 NOTE — ED Notes (Signed)
Pt had chemo on Thursday and is anemic; pt sts increased fatigue and SOB due to anemia

## 2015-02-06 NOTE — ED Provider Notes (Signed)
TIME SEEN: 1:50 PM  CHIEF COMPLAINT: Symptomatic anemia  HPI: Pt is a 74 y.o. male with history of lung cancer on chemotherapy his last chemotherapy was yesterday who is followed by Dr. Earlie Server who presents the emergency department with several days of shortness of breath with exertion, fatigue, generalized weakness. Reports similar symptoms in the past with symptomatic anemia. Reports last blood transfusion several months ago. Has had intermittent hematochezia but denies melena. Is on iron. Denies chest pain or chest discomfort. No fever.  ROS: See HPI Constitutional: no fever  Eyes: no drainage  ENT: no runny nose   Cardiovascular:  no chest pain  Resp:  SOB  GI: no vomiting GU: no dysuria Integumentary: no rash  Allergy: no hives  Musculoskeletal: no leg swelling  Neurological: no slurred speech ROS otherwise negative  PAST MEDICAL HISTORY/PAST SURGICAL HISTORY:  Past Medical History  Diagnosis Date  . Hypertension   . lung ca dx'd 02/2011    xrt/chemo comp 03/30/11, stage IIIA  . Hyperlipidemia   . 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 History of angina   . CAD (coronary artery disease)     MEDICATIONS:  Prior to Admission medications   Medication Sig Start Date End Date Taking? Authorizing Provider  Albuterol Sulfate (PROAIR RESPICLICK) 469 (90 BASE) MCG/ACT AEPB Inhale 2 puffs into the lungs every 6 (six) hours as needed. 11/14/14   [name], MD  ALPRAZolam Duanne Moron) 0.25 MG tablet Take 0.25 mg by mouth 2 (two) times daily as needed for anxiety.     Historical Provider, MD  Alum & Mag Hydroxide-Simeth (MAGIC MOUTHWASH W/LIDOCAINE) SOLN Take 5 mLs by mouth 4 (four) times daily as needed for mouth pain. 01/06/15   [name], MD  bevacizumab (AVASTIN) 1.25 mg/0.1 mL SOLN 1.25 mg by Intravitreal route every 30 (thirty) days.    Hayden Pedro, MD  budesonide-formoterol [facility]) 160-4.5 MCG/ACT  inhaler Inhale 2 puffs into the lungs 2 (two) times daily.    Historical Provider, MD  Calcium Carbonate-Vitamin D (CALCIUM-VITAMIN D) 500-200 MG-UNIT per tablet Take 1 tablet by mouth 2 (two) times daily.    Historical Provider, MD  citalopram (CELEXA) 40 MG tablet Take 20 mg by mouth daily.  09/10/14   Historical Provider, MD  furosemide (LASIX) 40 MG tablet Take 1 tablet (40 mg total) by mouth daily. Take as directed for the next 3 days. 11/20/14   Candee Furbish, MD  GuaiFENesin (MUCINEX PO) Take 600 mg by mouth 2 (two) times daily. 11/24/14   Historical Provider, MD  HYDROmorphone (DILAUDID) 2 MG tablet Take 1 tablet (2 mg total) by mouth every 6 (six) hours as needed for severe pain. Patient not taking: Reported on 01/19/2015 10/07/14   [name], MD  levalbuterol Penne Lash) 0.63 MG/3ML nebulizer solution Take 0.63 mg by nebulization 2 (two) times daily.     Historical Provider, MD  lisinopril (PRINIVIL,ZESTRIL) 10 MG tablet Take 10 mg by mouth daily.    Historical Provider, MD  naproxen (NAPROSYN) 500 MG tablet Take 500 mg by mouth 2 (two) times daily with a meal.  07/28/14   Historical Provider, MD  omeprazole (PRILOSEC) 20 MG capsule Take 40 mg by mouth daily.     Historical Provider, MD  potassium chloride SA (K-DUR,KLOR-CON) 20 MEQ tablet Take 1 tablet (20 mEq total) by mouth once. Take as directed for the next 3 days 11/20/14  Candee Furbish, MD  predniSONE (DELTASONE) 10 MG tablet Take 20 mg by mouth daily.     Historical Provider, MD  prochlorperazine (COMPAZINE) 10 MG tablet Take 1 tablet (10 mg total) by mouth every 6 (six) hours as needed for nausea or vomiting. 10/16/14   [name], MD  simvastatin (ZOCOR) 40 MG tablet Take 40 mg by mouth every Monday, Wednesday, and Friday.     Historical Provider, MD  temazepam (RESTORIL) 30 MG capsule Take 30 mg by mouth at bedtime as needed for sleep.    Josetta Huddle, MD    ALLERGIES:  Allergies  Allergen Reactions  .  Hydrocodone-Acetaminophen Itching  . Morphine And Related Other (See Comments)    Hallucinations, headache  . Tramadol     Pt states will not take med b/c it does not work   . Zolpidem Tartrate Other (See Comments)    nightmares    SOCIAL HISTORY:  History  Substance Use Topics  . Smoking status: Former Smoker -- 0.50 packs/day for 53 years    Types: Cigarettes    Quit date: 10/05/2013  . Smokeless tobacco: Never Used  . Alcohol Use: Yes     Comment: occasionally    FAMILY HISTORY: Family History  Problem Relation Age of Onset  . Cancer Father     multiple myeloma  . COPD Sister     emphysema- smoked  . Cancer Brother     multiple myeloma    EXAM: BP 109/58 mmHg  Pulse 122  Temp(Src) 97.7 F (36.5 C) (Oral)  Resp 18  SpO2 99% CONSTITUTIONAL: Alert and oriented and responds appropriately to questions. Well-appearing; well-nourished, in moderate respiratory distress, speaking short sentences but is nontoxic, smiling, making jokes HEAD: Normocephalic EYES: Conjunctivae clear, PERRL ENT: normal nose; no rhinorrhea; moist mucous membranes; pharynx without lesions noted NECK: Supple, no meningismus, no LAD  CARD: Regular and tachycardic; S1 and S2 appreciated; no murmurs, no clicks, no rubs, no gallops RESP: Normal chest excursion without splinting, patient is tachypneic and speaking short sentences but not hypoxic, breath sounds equal bilaterally; no wheezes, no rhonchi, no rales, slightly diminished in his left upper lobe  ABD/GI: Normal bowel sounds; non-distended; soft, non-tender, no rebound, no guarding RECTAL:  Normal rectal tone, 1 small nonbleeding and nonthrombosed external hemorrhoid, stools are dark but no hematochezia or melena BACK:  The back appears normal and is non-tender to palpation, there is no CVA tenderness EXT: Normal ROM in all joints; non-tender to palpation; no edema; normal capillary refill; no cyanosis; no calf tenderness or swelling    SKIN:  Normal color for age and race; warm NEURO: Moves all extremities equally PSYCH: The patient's mood and manner are appropriate. Grooming and personal hygiene are appropriate.  MEDICAL DECISION MAKING: Patient here with symptomatic anemia. PCP is Dr. Josetta Huddle with [facility].. Hemoglobin today was 7.7. Was instructed to come to the hospital. Here he is tachycardic, tachypneic. Afebrile and otherwise hemodynamically stable. Will obtain labs, type and screen, coags. Reports not being on anticoagulation. Hemoccult pending. No abdominal pain on exam. He is on iron tablets chronically. Patient will likely need blood transfusion and admission.   ED PROGRESS:   Patient's hemoglobin is 7.6. His white count is 23.6 but reports that yesterday he did receive an injection of Neulasta. He is fecal occult negative. Chest x-ray shows no new opacities. His persistent consolidation and volume loss in the right mid and lower lungs. Troponin negative.   Patient does have hyperkalemia with  potassium of 5.6. No interval changes that are new compared to prior. He also has a metabolic acidosis which is likely secondary to uremia has history of chronic renal failure. Creatinine today is 1.97, GFR 32. We'll continue to hydrate patient with IV fluids and give Kayexalate. We'll discuss with hospitalist for admission.    3:30 PM  Discussed with Carlean Jews for admission to telemetry, inpatient.   EKG Interpretation  Date/Time:  Friday February 06 2015 13:54:01 EST Ventricular Rate:  126 PR Interval:  142 QRS Duration: 115 QT Interval:  278 QTC Calculation: 402 R Axis:   31 Text Interpretation:  Sinus tachycardia Nonspecific intraventricular conduction delay Inferior infarct, old Anterolateral infarct, age indeterminate Baseline wander in lead(s) V3 T wave inversions in lateral leads that are new compared to prior EKG Confirmed by Kare Dado,  DO, Shanasia Ibrahim [phone]) on 02/06/2015 2:04:17 PM         CRITICAL  CARE Performed by: Nyra Jabs   Total critical care time: 45 minutes  Critical care time was exclusive of separately billable procedures and treating other patients.  Critical care was necessary to treat or prevent imminent or life-threatening deterioration.  Critical care was time spent personally by me on the following activities: development of treatment plan with patient and/or surrogate as well as nursing, discussions with consultants, evaluation of patient's response to treatment, examination of patient, obtaining history from patient or surrogate, ordering and performing treatments and interventions, ordering and review of laboratory studies, ordering and review of radiographic studies, pulse oximetry and re-evaluation of patient's condition.   [place], DO 02/06/15 1537

## 2015-02-07 LAB — CBC WITH DIFFERENTIAL/PLATELET
BASOS PCT: 0 % (ref 0–1)
BLASTS: 0 %
Band Neutrophils: 0 % (ref 0–10)
Basophils Absolute: 0 10*3/uL (ref 0.0–0.1)
EOS ABS: 0 10*3/uL (ref 0.0–0.7)
Eosinophils Relative: 0 % (ref 0–5)
HCT: 28.2 % — ABNORMAL LOW (ref 39.0–52.0)
HEMOGLOBIN: 9.6 g/dL — AB (ref 13.0–17.0)
LYMPHS ABS: 2.9 10*3/uL (ref 0.7–4.0)
LYMPHS PCT: 17 % (ref 12–46)
MCH: 32.8 pg (ref 26.0–34.0)
MCHC: 34 g/dL (ref 30.0–36.0)
MCV: 96.2 fL (ref 78.0–100.0)
MONOS PCT: 9 % (ref 3–12)
Metamyelocytes Relative: 0 %
Monocytes Absolute: 1.5 10*3/uL — ABNORMAL HIGH (ref 0.1–1.0)
Myelocytes: 0 %
NEUTROS PCT: 74 % (ref 43–77)
Neutro Abs: 12.7 10*3/uL — ABNORMAL HIGH (ref 1.7–7.7)
PLATELETS: 36 10*3/uL — AB (ref 150–400)
Promyelocytes Absolute: 0 %
RBC: 2.93 MIL/uL — ABNORMAL LOW (ref 4.22–5.81)
RDW: 23.7 % — AB (ref 11.5–15.5)
WBC: 17.1 10*3/uL — AB (ref 4.0–10.5)
nRBC: 0 /100 WBC

## 2015-02-07 LAB — TYPE AND SCREEN
ABO/RH(D): O POS
Antibody Screen: NEGATIVE
Unit division: 0
Unit division: 0

## 2015-02-07 LAB — BASIC METABOLIC PANEL
Anion gap: 9 (ref 5–15)
BUN: 35 mg/dL — ABNORMAL HIGH (ref 6–23)
CALCIUM: 7.9 mg/dL — AB (ref 8.4–10.5)
CO2: 23 mmol/L (ref 19–32)
Chloride: 107 mmol/L (ref 96–112)
Creatinine, Ser: 1.49 mg/dL — ABNORMAL HIGH (ref 0.50–1.35)
GFR calc Af Amer: 53 mL/min — ABNORMAL LOW (ref >90)
GFR calc non Af Amer: 45 mL/min — ABNORMAL LOW (ref >90)
GLUCOSE: 80 mg/dL (ref 70–99)
POTASSIUM: 4.5 mmol/L (ref 3.5–5.1)
SODIUM: 139 mmol/L (ref 135–145)

## 2015-02-07 LAB — TROPONIN I: Troponin I: 0.21 ng/mL — ABNORMAL HIGH (ref <0.031)

## 2015-02-07 MED ORDER — FUROSEMIDE 40 MG PO TABS: 40.0000 mg | ORAL_TABLET | Freq: Every day | ORAL | Status: DC

## 2015-02-07 MED ORDER — LISINOPRIL 10 MG PO TABS: 10.0000 mg | ORAL_TABLET | Freq: Every day | ORAL | Status: DC

## 2015-02-07 NOTE — Discharge Summary (Signed)
Todd Villanueva, 74 y.o., DOB Nov 30, 1941, MRN [id number]. Admission date: 02/06/2015 Discharge Date 02/07/2015 Primary MD [name], MD Admitting Physician Samuella Cota, MD   PCP please follow on: - Please check patient labs including CBC, BMP during next visit, as he is having anemia and thrombocytopenia related to chemotherapy, and recovering from acute renal failure, instructed to hold Lasix for 48 hours on discharge.  Admission Diagnosis  Hyperkalemia [W09.8] Metabolic acidosis [J19.1] Dyspnea [R06.00] Anemia, unspecified anemia type [D64.9]  Discharge Diagnosis   Principal Problem:   Symptomatic anemia Active Problems:   COPD with emphysema   Anemia   Cardiomyopathy, ischemic EF 35-40% by echo 01/28/14   Acute renal failure   CKD (chronic kidney disease), stage III   Hyperkalemia      Past Medical History  Diagnosis Date  . Hypertension   . lung ca dx'd 02/2011    xrt/chemo comp 03/30/11, stage IIIA  . Hyperlipidemia   . 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 History of angina   . CAD (coronary artery disease)   . Systolic CHF     Past Surgical History  Procedure Laterality Date  . Right vats, rt thoracotomy, rt middle and rt lower  lobectomy  06/10/2011    Dr Arlyce Dice  . Knee arthroscopy    . Tonsillectomy    . Coronary stent placement  JULY,AUGUST 2011  . Orif femoral neck fracture w/ dhs    . Left heart catheterization with coronary angiogram N/A 06/17/2014    Procedure: LEFT HEART CATHETERIZATION WITH CORONARY ANGIOGRAM;  Surgeon: Candee Furbish, MD;  Location: [facility] CATH LAB;  Service: Cardiovascular;  Laterality: N/A;     Hospital Course See H&P, Labs, Consult and Test reports for all details in brief, patient was admitted for **  Principal Problem:   Symptomatic anemia Active Problems:   COPD with emphysema   Anemia   Cardiomyopathy, ischemic EF 35-40% by echo 01/28/14   Acute  renal failure   CKD (chronic kidney disease), stage III   Hyperkalemia  74 year old male currently undergoing chemotherapy for lung cancer with last treatment 2/25 present to the emergency department with increasing shortness of breath and dyspnea on exertion, generalized weakness and fatigue. Initial evaluation revealed anemia, acute on chronic renal failure with modest hyperkalemia. He was referred for observation for symptomatic anemia.  He has had several instances of symptomatic anemia in the past with chemotherapy. He developed similar symptoms again with exertional dyspnea and fatigue. No chest pain. No fever. No systemic symptoms. No other complaints at this time. His oral intake has been somewhat poor. He said no uremic symptoms. -Of note he did receive Neulasta in the last week per wife. In the emergency department afebrile, tachycardic, normotensive, no hypoxia. Basic metabolic panel revealed mild hyperkalemia, CO2 18, BUN elevated 51, creatinine above baseline 1.97. WBC 23.6, hemoglobin 7.6, platelet count 47,000. Chest x-ray with persistent consolidation and volume loss at the right base. No new opacity. No changes seen. EKG nuclear view chest sinus tachycardia, lateral T-wave inversion, consider ischemia. Troponin negative.  Symptomatic macrocytic anemia secondary to chemotherapy with associated shortness of breath and fatigue. Possibly complicated by Naprosyn. - Transfused 2 units packed red blood cells, repeat hemoglobin in a.m. is 9.6, reports significant improvement of his symptoms, patient will be discharged today, instructed to hold his naproxen.  Acute renal failure superimposed on chronic kidney disease stage III  with non-metabolic acidosis and hyperkalemia. Complicated by lisinopril, Lasix and potassium supplementation. Secondary to poor oral intake.  - Acute renal failure much improved, creatinine on discharge is 1.49, patient instructed to hold lisinopril and Lasix for 48  hours. - Hyperkalemia resolved with Kayexalate potassium is 4.5 on day of discharge, potassium supplement were stopped on discharge.  Leukocytosis secondary to recent Neulasta injection.  Metastatic non-small lung cancer, status post radiation therapy, currently undergoing systemic chemotherapy with carboplatinum and paclitaxel  COPD appears stable.  Chronic systolic congestive heart failure, LVEF 35-40 percent. Grade 1 diastolic dysfunction.  -Appears compensated, will hold Lasix for 48 hours on discharge.  History of coronary artery disease - Denies any chest pain, has mildly elevated troponins, this is most likely in the setting of his chronic kidney disease, and significant anemia, troponins has been chronically elevated in the past . EKG nonacute.  Consults  None Significant Tests:  See full reports for all details    Dg Chest Port 1 View  02/06/2015   CLINICAL DATA:  Difficulty breathing and weakness  EXAM: PORTABLE CHEST - 1 VIEW  COMPARISON:  Chest radiograph February 01, 2014 and chest CT December 22, 2014  FINDINGS: There is chronic volume loss in the right middle and lower lung regions, stable. Left lung is clear. Heart size is within normal limits. No adenopathy.  IMPRESSION: Persistent consolidation and volume loss in the right mid and lower lung zones, stable. No new opacity. No change in cardiac silhouette.   Electronically Signed   By: Lowella Grip III M.D.   On: 02/06/2015 14:43     Today   Subjective:   Todd Villanueva today has no headache,no chest abdominal pain,no new weakness tingling or numbness, feels much better wants to go home today.   Objective:   Blood pressure 141/72, pulse 104, temperature 98.4 F (36.9 C), temperature source Oral, resp. rate 18, height 5\' 6"  (1.676 m), weight 81.466 kg (179 lb 9.6 oz), SpO2 99 %.  Intake/Output Summary (Last 24 hours) at 02/07/15 1126 Last data filed at 02/07/15 1000  Gross per 24 hour  Intake   2225 ml  Output    1726 ml  Net    499 ml    Exam Awake Alert, Oriented *3, No new F.N deficits, Normal affect [place].AT,PERRAL Supple Neck,No JVD, No cervical lymphadenopathy appriciated.  Symmetrical Chest wall movement, Good air movement bilaterally,  RRR,No Gallops,Rubs or new Murmurs, No Parasternal Heave +ve B.Sounds, Abd Soft, Non tender, No organomegaly appriciated, No rebound -guarding or rigidity. No Cyanosis, Clubbing or edema, No new Rash or bruise  Data Review   Cultures -   CBC w Diff:  Lab Results  Component Value Date   WBC 17.1* 02/07/2015   WBC 5.4 01/28/2015   HGB 9.6* 02/07/2015   HGB 8.4* 01/28/2015   HCT 28.2* 02/07/2015   HCT 25.3* 01/28/2015   PLT 36* 02/07/2015   PLT 92* 01/28/2015   LYMPHOPCT 17 02/07/2015   LYMPHOPCT 23.4 01/28/2015   BANDSPCT 0 02/07/2015   MONOPCT 9 02/07/2015   MONOPCT 13.1 01/28/2015   EOSPCT 0 02/07/2015   EOSPCT 0.4 01/28/2015   BASOPCT 0 02/07/2015   BASOPCT 0.2 01/28/2015   CMP:  Lab Results  Component Value Date   NA 139 02/07/2015   NA 141 01/28/2015   NA 142 01/10/2012   K 4.5 02/07/2015   K 4.1 01/28/2015   K 3.9 01/10/2012   CL 107 02/07/2015   CL 107 10/15/2012  CL 102 01/10/2012   CO2 23 02/07/2015   CO2 20* 01/28/2015   CO2 26 01/10/2012   BUN 35* 02/07/2015   BUN 44.2* 01/28/2015   BUN 27* 01/10/2012   CREATININE 1.49* 02/07/2015   CREATININE 1.6* 01/28/2015   CREATININE 1.1 01/10/2012   PROT 6.5 02/06/2015   PROT 6.3* 01/28/2015   PROT 7.4 01/10/2012   ALBUMIN 3.6 02/06/2015   ALBUMIN 3.3* 01/28/2015   BILITOT 0.6 02/06/2015   BILITOT 0.35 01/28/2015   BILITOT 0.60 01/10/2012   ALKPHOS 146* 02/06/2015   ALKPHOS 115 01/28/2015   ALKPHOS 271* 01/10/2012   AST 31 02/06/2015   AST 28 01/28/2015   AST 28 01/10/2012   ALT 42 02/06/2015   ALT 34 01/28/2015   ALT 28 01/10/2012  .  Micro Results No results found for this or any previous visit (from the past 240 hour(s)).   Discharge Instructions           Follow-up Information    Follow up with [name],[name], MD. Schedule an appointment as soon as possible for a visit in 1 week.   Specialty:  Internal Medicine   Why:  For repeat labs, after hospital stay   Contact information:   [address] [place] [address] [phone]       Discharge Medications     Medication List    STOP taking these medications        naproxen 500 MG tablet  Commonly known as:  NAPROSYN     potassium chloride SA 20 MEQ tablet  Commonly known as:  K-DUR,KLOR-CON      TAKE these medications        Albuterol Sulfate 108 (90 BASE) MCG/ACT Aepb  Commonly known as:  PROAIR RESPICLICK  Inhale 2 puffs into the lungs every 6 (six) hours as needed.     ALPRAZolam 0.25 MG tablet  Commonly known as:  XANAX  Take 0.25 mg by mouth 2 (two) times daily as needed for anxiety.     bevacizumab 1.25 mg/0.1 mL Soln  Commonly known as:  AVASTIN  1.25 mg by Intravitreal route every 30 (thirty) days.     budesonide-formoterol 160-4.5 MCG/ACT inhaler  Commonly known as:  SYMBICORT  Inhale 2 puffs into the lungs 2 (two) times daily.     calcium-vitamin D 500-200 MG-UNIT per tablet  Take 1 tablet by mouth daily.     citalopram 40 MG tablet  Commonly known as:  CELEXA  Take 20 mg by mouth daily.     furosemide 40 MG tablet  Commonly known as:  LASIX  Take 1 tablet (40 mg total) by mouth daily. Take as directed for the next 3 days.  Start taking on:  02/09/2015     HYDROmorphone 2 MG tablet  Commonly known as:  DILAUDID  Take 1 tablet (2 mg total) by mouth every 6 (six) hours as needed for severe pain.     lisinopril 10 MG tablet  Commonly known as:  PRINIVIL,ZESTRIL  Take 1 tablet (10 mg total) by mouth daily.  Start taking on:  02/09/2015     magic mouthwash w/lidocaine Soln  Take 5 mLs by mouth 4 (four) times daily as needed for mouth pain.     MUCINEX PO  Take 600 mg by mouth 2 (two) times daily.     omeprazole 20 MG  capsule  Commonly known as:  PRILOSEC  Take 40 mg by mouth daily.     predniSONE  10 MG tablet  Commonly known as:  DELTASONE  Take 20 mg by mouth daily.     prochlorperazine 10 MG tablet  Commonly known as:  COMPAZINE  Take 1 tablet (10 mg total) by mouth every 6 (six) hours as needed for nausea or vomiting.     simvastatin 40 MG tablet  Commonly known as:  ZOCOR  Take 40 mg by mouth every Monday, Wednesday, and Friday.     temazepam 30 MG capsule  Commonly known as:  RESTORIL  Take 30 mg by mouth at bedtime as needed for sleep.     XOPENEX 0.63 MG/3ML nebulizer solution  Generic drug:  levalbuterol  Take 0.63 mg by nebulization 2 (two) times daily.         Total Time in preparing paper work, data evaluation and todays exam - 35 minutes  ELGERGAWY, DAWOOD M.D on 02/07/2015 at 11:26 AM  [facility] Office  [phone]

## 2015-02-07 NOTE — Progress Notes (Addendum)
The patient refused to have labs drawn last night.  The patient also had numerous complaints last night.  He was given a PRN albuterol tx for wheezing at 0530 this morning.

## 2015-02-07 NOTE — Discharge Instructions (Signed)
Follow with Primary MD [name],[name], MD in 7 days  Hold her Lasix for 2 days, resume on 3/7  Get CBC, CMP, 2 view Chest X ray checked  by Primary MD next visit.    Activity: As tolerated with Full fall precautions use walker/cane & assistance as needed   Disposition Home    Diet: Heart Healthy  , with feeding assistance and aspiration precautions as needed.  For Heart failure patients - Check your Weight same time everyday, if you gain over 2 pounds, or you develop in leg swelling, experience more shortness of breath or chest pain, call your Primary MD immediately. Follow Cardiac Low Salt Diet and 1.5 lit/day fluid restriction.   On your next visit with your primary care physician please Get Medicines reviewed and adjusted.   Please request your Prim.MD to go over all Hospital Tests and Procedure/Radiological results at the follow up, please get all Hospital records sent to your Prim MD by signing hospital release before you go home.   If you experience worsening of your admission symptoms, develop shortness of breath, life threatening emergency, suicidal or homicidal thoughts you must seek medical attention immediately by calling 911 or calling your MD immediately  if symptoms less severe.  You Must read complete instructions/literature along with all the possible adverse reactions/side effects for all the Medicines you take and that have been prescribed to you. Take any new Medicines after you have completely understood and accpet all the possible adverse reactions/side effects.   Do not drive, operating heavy machinery, perform activities at heights, swimming or participation in water activities or provide baby sitting services if your were admitted for syncope or siezures until you have seen by Primary MD or a Neurologist and advised to do so again.  Do not drive when taking Pain medications.    Do not take more than prescribed Pain, Sleep and Anxiety Medications  Special  Instructions: If you have smoked or chewed Tobacco  in the last 2 yrs please stop smoking, stop any regular Alcohol  and or any Recreational drug use.  Wear Seat belts while driving.   Please note  You were cared for by a hospitalist during your hospital stay. If you have any questions about your discharge medications or the care you received while you were in the hospital after you are discharged, you can call the unit and asked to speak with the hospitalist on call if the hospitalist that took care of you is not available. Once you are discharged, your primary care physician will handle any further medical issues. Please note that NO REFILLS for any discharge medications will be authorized once you are discharged, as it is imperative that you return to your primary care physician (or establish a relationship with a primary care physician if you do not have one) for your aftercare needs so that they can reassess your need for medications and monitor your lab values.

## 2015-02-07 NOTE — Progress Notes (Signed)
Pt A&O x4; pt discharge education and instructions completed with pt and spouse at bedside. All voices understanding and denies any questions. Pt IV and telemetry removed per protocol; pt discharge home with spouse to transport him to disposition. Pt transported off unit via wheelchair with belongings and spouse at side. Delia Heady RN

## 2015-02-10 ENCOUNTER — Telehealth: Payer: Self-pay | Admitting: Medical Oncology

## 2015-02-10 NOTE — Telephone Encounter (Signed)
I left a  message for [name] to call me regarding pt FMLA form that I have not received.

## 2015-02-11 ENCOUNTER — Encounter: Payer: Self-pay | Admitting: Internal Medicine

## 2015-02-11 ENCOUNTER — Encounter (INDEPENDENT_AMBULATORY_CARE_PROVIDER_SITE_OTHER): Payer: 59 | Admitting: Ophthalmology

## 2015-02-11 DIAGNOSIS — H35371 Puckering of macula, right eye: Secondary | ICD-10-CM | POA: Diagnosis not present

## 2015-02-11 DIAGNOSIS — I10 Essential (primary) hypertension: Secondary | ICD-10-CM

## 2015-02-11 DIAGNOSIS — H43813 Vitreous degeneration, bilateral: Secondary | ICD-10-CM | POA: Diagnosis not present

## 2015-02-11 DIAGNOSIS — H35033 Hypertensive retinopathy, bilateral: Secondary | ICD-10-CM | POA: Diagnosis not present

## 2015-02-11 DIAGNOSIS — H34811 Central retinal vein occlusion, right eye: Secondary | ICD-10-CM

## 2015-02-11 NOTE — Progress Notes (Signed)
I refaxed the fmla forms back to [employer] [phone].  Per [name] some pages were missing.Todd Villanueva

## 2015-02-17 ENCOUNTER — Telehealth: Payer: Self-pay | Admitting: Internal Medicine

## 2015-02-17 ENCOUNTER — Telehealth: Payer: Self-pay | Admitting: *Deleted

## 2015-02-17 NOTE — Telephone Encounter (Signed)
Per staff message and POF I have scheduled appts. Advised scheduler of appts and first available given  JMW

## 2015-02-17 NOTE — Telephone Encounter (Signed)
returned patient call and cancelled appt due to patient sick....resched to next week...emailed M. Wheat to sched chemo.

## 2015-02-18 ENCOUNTER — Ambulatory Visit: Payer: 59

## 2015-02-18 ENCOUNTER — Other Ambulatory Visit: Payer: 59

## 2015-02-18 ENCOUNTER — Ambulatory Visit: Payer: 59 | Admitting: Internal Medicine

## 2015-02-18 ENCOUNTER — Encounter: Payer: 59 | Admitting: Nutrition

## 2015-02-19 ENCOUNTER — Ambulatory Visit: Payer: 59

## 2015-02-25 ENCOUNTER — Ambulatory Visit: Payer: 59

## 2015-02-25 ENCOUNTER — Other Ambulatory Visit (HOSPITAL_BASED_OUTPATIENT_CLINIC_OR_DEPARTMENT_OTHER): Payer: 59

## 2015-02-25 ENCOUNTER — Ambulatory Visit (HOSPITAL_BASED_OUTPATIENT_CLINIC_OR_DEPARTMENT_OTHER): Payer: 59 | Admitting: Internal Medicine

## 2015-02-25 ENCOUNTER — Encounter: Payer: Self-pay | Admitting: Internal Medicine

## 2015-02-25 ENCOUNTER — Telehealth: Payer: Self-pay | Admitting: *Deleted

## 2015-02-25 VITALS — BP 152/74 | HR 109 | Temp 97.5°F | Resp 19 | Ht 66.0 in | Wt 181.9 lb

## 2015-02-25 DIAGNOSIS — C3431 Malignant neoplasm of lower lobe, right bronchus or lung: Secondary | ICD-10-CM | POA: Diagnosis not present

## 2015-02-25 LAB — CBC WITH DIFFERENTIAL/PLATELET
BASO%: 0.2 % (ref 0.0–2.0)
Basophils Absolute: 0 10*3/uL (ref 0.0–0.1)
EOS%: 0.2 % (ref 0.0–7.0)
Eosinophils Absolute: 0 10*3/uL (ref 0.0–0.5)
HCT: 32.4 % — ABNORMAL LOW (ref 38.4–49.9)
HEMOGLOBIN: 10.7 g/dL — AB (ref 13.0–17.1)
LYMPH#: 0.9 10*3/uL (ref 0.9–3.3)
LYMPH%: 6.8 % — AB (ref 14.0–49.0)
MCH: 33.2 pg (ref 27.2–33.4)
MCHC: 33 g/dL (ref 32.0–36.0)
MCV: 100.6 fL — ABNORMAL HIGH (ref 79.3–98.0)
MONO#: 0.6 10*3/uL (ref 0.1–0.9)
MONO%: 5.1 % (ref 0.0–14.0)
NEUT#: 11 10*3/uL — ABNORMAL HIGH (ref 1.5–6.5)
NEUT%: 87.7 % — ABNORMAL HIGH (ref 39.0–75.0)
Platelets: 83 10*3/uL — ABNORMAL LOW (ref 140–400)
RBC: 3.22 10*6/uL — ABNORMAL LOW (ref 4.20–5.82)
RDW: 22.6 % — AB (ref 11.0–14.6)
WBC: 12.5 10*3/uL — AB (ref 4.0–10.3)
nRBC: 0 % (ref 0–0)

## 2015-02-25 LAB — TECHNOLOGIST REVIEW

## 2015-02-25 LAB — COMPREHENSIVE METABOLIC PANEL (CC13)
ALK PHOS: 204 U/L — AB (ref 40–150)
ALT: 109 U/L — AB (ref 0–55)
AST: 76 U/L — AB (ref 5–34)
Albumin: 3.4 g/dL — ABNORMAL LOW (ref 3.5–5.0)
Anion Gap: 14 mEq/L — ABNORMAL HIGH (ref 3–11)
BILIRUBIN TOTAL: 0.51 mg/dL (ref 0.20–1.20)
BUN: 42.8 mg/dL — AB (ref 7.0–26.0)
CALCIUM: 7.8 mg/dL — AB (ref 8.4–10.4)
CO2: 18 meq/L — AB (ref 22–29)
Chloride: 108 mEq/L (ref 98–109)
Creatinine: 1.6 mg/dL — ABNORMAL HIGH (ref 0.7–1.3)
EGFR: 42 mL/min/{1.73_m2} — ABNORMAL LOW (ref >90)
Glucose: 116 mg/dl (ref 70–140)
Potassium: 4.4 mEq/L (ref 3.5–5.1)
SODIUM: 140 meq/L (ref 136–145)
Total Protein: 6.9 g/dL (ref 6.4–8.3)

## 2015-02-25 NOTE — Progress Notes (Signed)
[facility] [phone]   [phone]  OFFICE PROGRESS NOTE  [name],[name], MD [address] [place] [address]  DIAGNOSIS: Metastatic non-small cell lung cancer initially diagnosed as Stage IIB/IIIA non-small cell lung cancer, squamous cell carcinoma diagnosed in February of 2012.   PRIOR THERAPY:  1) Status post a course of concurrent chemoradiation with weekly carboplatin and paclitaxel. Last dose of chemotherapy was given on 03/21/2011.  2) status post right VATS, right thoracotomy, right middle and right lower lobectomies under the care of [name] on 06/10/2011.   CURRENT THERAPY: Systemic chemotherapy was carboplatin for AUC of 5 and paclitaxel 175 MG/M2 every 3 weeks with Neulasta support. First cycle on 10/14/2014. Status post 5 cycles.   INTERVAL HISTORY: Todd Villanueva 74 y.o. male returns to the clinic today for follow-up visit. The patient is currently on systemic chemotherapy with carboplatin and paclitaxel status post 5 cycles and tolerating his treatment well except for pancytopenia. He'll require 2 units of PRBCs transfusion 2 weeks ago secondary to chemotherapy-induced anemia. The patient continues to complain of shortness of breath with exertion secondary to COPD and lung cancer. He denied having any significant chest pain but has mild cough with no hemoptysis. The patient denied having any significant nausea or vomiting, no fever or chills. He is here today to start cycle #6 of his treatment.  MEDICAL HISTORY: Past Medical History  Diagnosis Date  . Hypertension   . lung ca dx'd 02/2011    xrt/chemo comp 03/30/11, stage IIIA  . Hyperlipidemia   . 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 History of angina   . CAD (coronary artery disease)   . Systolic CHF     ALLERGIES:  is allergic to hydrocodone-acetaminophen; morphine and  related; tramadol; and zolpidem tartrate.  MEDICATIONS:  Current Outpatient Prescriptions  Medication Sig Dispense Refill  . Albuterol Sulfate (PROAIR RESPICLICK) 425 (90 BASE) MCG/ACT AEPB Inhale 2 puffs into the lungs every 6 (six) hours as needed. 1 each 5  . ALPRAZolam (XANAX) 0.25 MG tablet Take 0.25 mg by mouth 2 (two) times daily as needed for anxiety.     . Alum & Mag Hydroxide-Simeth (MAGIC MOUTHWASH W/LIDOCAINE) SOLN Take 5 mLs by mouth 4 (four) times daily as needed for mouth pain. (Patient not taking: Reported on 02/06/2015) 240 mL 3  . bevacizumab (AVASTIN) 1.25 mg/0.1 mL SOLN 1.25 mg by Intravitreal route every 30 (thirty) days.    . budesonide-formoterol (SYMBICORT) 160-4.5 MCG/ACT inhaler Inhale 2 puffs into the lungs 2 (two) times daily.    . Calcium Carbonate-Vitamin D (CALCIUM-VITAMIN D) 500-200 MG-UNIT per tablet Take 1 tablet by mouth daily.     . citalopram (CELEXA) 40 MG tablet Take 20 mg by mouth daily.   11  . furosemide (LASIX) 40 MG tablet Take 1 tablet (40 mg total) by mouth daily. Take as directed for the next 3 days. 30 tablet 3  . GuaiFENesin (MUCINEX PO) Take 600 mg by mouth 2 (two) times daily.    Marland Kitchen HYDROmorphone (DILAUDID) 2 MG tablet Take 1 tablet (2 mg total) by mouth every 6 (six) hours as needed for severe pain. (Patient not taking: Reported on 01/19/2015) 360 tablet 0  . levalbuterol (XOPENEX) 0.63 MG/3ML nebulizer solution Take 0.63 mg by nebulization 2 (two) times daily.     Marland Kitchen lisinopril (PRINIVIL,ZESTRIL)  10 MG tablet Take 1 tablet (10 mg total) by mouth daily.    Marland Kitchen omeprazole (PRILOSEC) 20 MG capsule Take 40 mg by mouth daily.     . predniSONE (DELTASONE) 10 MG tablet Take 20 mg by mouth daily.     . prochlorperazine (COMPAZINE) 10 MG tablet Take 1 tablet (10 mg total) by mouth every 6 (six) hours as needed for nausea or vomiting. 30 tablet 1  . simvastatin (ZOCOR) 40 MG tablet Take 40 mg by mouth every Monday, Wednesday, and Friday.     . temazepam  (RESTORIL) 30 MG capsule Take 30 mg by mouth at bedtime as needed for sleep.     No current facility-administered medications for this visit.    SURGICAL HISTORY:  Past Surgical History  Procedure Laterality Date  . Right vats, rt thoracotomy, rt middle and rt lower  lobectomy  06/10/2011    Dr Arlyce Dice  . Knee arthroscopy    . Tonsillectomy    . Coronary stent placement  JULY,AUGUST 2011  . Orif femoral neck fracture w/ dhs    . Left heart catheterization with coronary angiogram N/A 06/17/2014    Procedure: LEFT HEART CATHETERIZATION WITH CORONARY ANGIOGRAM;  Surgeon: Candee Furbish, MD;  Location: [facility] CATH LAB;  Service: Cardiovascular;  Laterality: N/A;    REVIEW OF SYSTEMS:  A comprehensive review of systems was negative except for: Constitutional: positive for fatigue Respiratory: positive for cough, dyspnea on exertion and wheezing   PHYSICAL EXAMINATION: General appearance: alert, cooperative, fatigued and no distress Head: Normocephalic, without obvious abnormality, atraumatic Neck: no adenopathy, no JVD, supple, symmetrical, trachea midline and thyroid not enlarged, symmetric, no tenderness/mass/nodules Lymph nodes: Cervical, supraclavicular, and axillary nodes normal. Resp: clear to auscultation bilaterally Back: symmetric, no curvature. ROM normal. No CVA tenderness. Cardio: regular rate and rhythm, S1, S2 normal, no murmur, click, rub or gallop GI: soft, non-tender; bowel sounds normal; no masses,  no organomegaly Extremities: extremities normal, atraumatic, no cyanosis or edema  ECOG PERFORMANCE STATUS: 1 - Symptomatic but completely ambulatory  Blood pressure 152/74, pulse 109, temperature 97.5 F (36.4 C), temperature source Oral, resp. rate 19, height 5\' 6"  (1.676 m), weight 181 lb 14.4 oz (82.509 kg), SpO2 97 %.  LABORATORY DATA: Lab Results  Component Value Date   WBC 12.5* 02/25/2015   HGB 10.7* 02/25/2015   HCT 32.4* 02/25/2015   MCV 100.6* 02/25/2015   PLT 83*  02/25/2015      Chemistry      Component Value Date/Time   NA 140 02/25/2015 1043   NA 139 02/07/2015 0730   NA 142 01/10/2012 1205   K 4.4 02/25/2015 1043   K 4.5 02/07/2015 0730   K 3.9 01/10/2012 1205   CL 107 02/07/2015 0730   CL 107 10/15/2012 1521   CL 102 01/10/2012 1205   CO2 18* 02/25/2015 1043   CO2 23 02/07/2015 0730   CO2 26 01/10/2012 1205   BUN 42.8* 02/25/2015 1043   BUN 35* 02/07/2015 0730   BUN 27* 01/10/2012 1205   CREATININE 1.6* 02/25/2015 1043   CREATININE 1.49* 02/07/2015 0730   CREATININE 1.1 01/10/2012 1205      Component Value Date/Time   CALCIUM 7.8* 02/25/2015 1043   CALCIUM 7.9* 02/07/2015 0730   CALCIUM 8.8 01/10/2012 1205   ALKPHOS 204* 02/25/2015 1043   ALKPHOS 146* 02/06/2015 1420   ALKPHOS 271* 01/10/2012 1205   AST 76* 02/25/2015 1043   AST 31 02/06/2015 1420   AST 28 01/10/2012  1205   ALT 109* 02/25/2015 1043   ALT 42 02/06/2015 1420   ALT 28 01/10/2012 1205   BILITOT 0.51 02/25/2015 1043   BILITOT 0.6 02/06/2015 1420   BILITOT 0.60 01/10/2012 1205       RADIOGRAPHIC STUDIES: Dg Chest Port 1 View  02/06/2015   CLINICAL DATA:  Difficulty breathing and weakness  EXAM: PORTABLE CHEST - 1 VIEW  COMPARISON:  Chest radiograph February 01, 2014 and chest CT December 22, 2014  FINDINGS: There is chronic volume loss in the right middle and lower lung regions, stable. Left lung is clear. Heart size is within normal limits. No adenopathy.  IMPRESSION: Persistent consolidation and volume loss in the right mid and lower lung zones, stable. No new opacity. No change in cardiac silhouette.   Electronically Signed   By: Lowella Grip III M.D.   On: 02/06/2015 14:43    ASSESSMENT AND PLAN: This is a very pleasant 74 years old white male with metastatic non-small cell lung cancer who is currently undergoing systemic chemotherapy with carboplatin and paclitaxel is status post 5 cycles. The patient is tolerating his treatment fairly well with no  significant adverse effects except for chemotherapy-induced pancytopenia and he will required PRBCs transfusion recently. His platelets count are still low today. I recommended for the patient to delay the start of cycle #6 of his treatment by one week until recovery of his platelets count. I will continue to use the reduced dose of carboplatin for AUC of 4 and paclitaxel 150 MG/M2 for cycle #6. The patient will come back for follow-up visit in 4 weeks for reevaluation after repeating CT scan of the chest, abdomen and pelvis for restaging of his disease. He was advised to call immediately if he has any concerning symptoms in the interval. The patient voices understanding of current disease status and treatment options and is in agreement with the current care plan.  All questions were answered. The patient knows to call the clinic with any problems, questions or concerns. We can certainly see the patient much sooner if necessary.  Disclaimer: This note was dictated with voice recognition software. Similar sounding words can inadvertently be transcribed and may not be corrected upon review.

## 2015-02-25 NOTE — Telephone Encounter (Signed)
Per staff message and POF I have scheduled appts. Advised scheduler of appts. JMW  

## 2015-02-26 ENCOUNTER — Ambulatory Visit: Payer: 59

## 2015-02-26 ENCOUNTER — Encounter: Payer: 59 | Admitting: Nutrition

## 2015-03-03 ENCOUNTER — Emergency Department (HOSPITAL_COMMUNITY): Payer: 59

## 2015-03-03 ENCOUNTER — Encounter (HOSPITAL_COMMUNITY): Payer: Self-pay | Admitting: Emergency Medicine

## 2015-03-03 ENCOUNTER — Other Ambulatory Visit: Payer: Self-pay

## 2015-03-03 ENCOUNTER — Telehealth: Payer: Self-pay | Admitting: *Deleted

## 2015-03-03 ENCOUNTER — Inpatient Hospital Stay (HOSPITAL_COMMUNITY)
Admission: EM | Admit: 2015-03-03 | Discharge: 2015-03-04 | DRG: 436 | Disposition: A | Discharge status: Home / Self Care | Payer: 59 | Attending: Internal Medicine | Admitting: Internal Medicine

## 2015-03-03 DIAGNOSIS — R740 Nonspecific elevation of levels of transaminase and lactic acid dehydrogenase [LDH]: Secondary | ICD-10-CM | POA: Diagnosis not present

## 2015-03-03 DIAGNOSIS — E860 Dehydration: Secondary | ICD-10-CM | POA: Diagnosis present

## 2015-03-03 DIAGNOSIS — D6959 Other secondary thrombocytopenia: Secondary | ICD-10-CM | POA: Diagnosis present

## 2015-03-03 DIAGNOSIS — Z807 Family history of other malignant neoplasms of lymphoid, hematopoietic and related tissues: Secondary | ICD-10-CM | POA: Diagnosis not present

## 2015-03-03 DIAGNOSIS — R1013 Epigastric pain: Secondary | ICD-10-CM | POA: Diagnosis not present

## 2015-03-03 DIAGNOSIS — I129 Hypertensive chronic kidney disease with stage 1 through stage 4 chronic kidney disease, or unspecified chronic kidney disease: Secondary | ICD-10-CM | POA: Diagnosis present

## 2015-03-03 DIAGNOSIS — N184 Chronic kidney disease, stage 4 (severe): Secondary | ICD-10-CM | POA: Diagnosis present

## 2015-03-03 DIAGNOSIS — C787 Secondary malignant neoplasm of liver and intrahepatic bile duct: Principal | ICD-10-CM | POA: Diagnosis present

## 2015-03-03 DIAGNOSIS — I471 Supraventricular tachycardia: Secondary | ICD-10-CM | POA: Diagnosis present

## 2015-03-03 DIAGNOSIS — I2510 Atherosclerotic heart disease of native coronary artery without angina pectoris: Secondary | ICD-10-CM | POA: Diagnosis present

## 2015-03-03 DIAGNOSIS — I5022 Chronic systolic (congestive) heart failure: Secondary | ICD-10-CM | POA: Diagnosis present

## 2015-03-03 DIAGNOSIS — C3431 Malignant neoplasm of lower lobe, right bronchus or lung: Secondary | ICD-10-CM | POA: Diagnosis present

## 2015-03-03 DIAGNOSIS — Z7952 Long term (current) use of systemic steroids: Secondary | ICD-10-CM | POA: Diagnosis not present

## 2015-03-03 DIAGNOSIS — C801 Malignant (primary) neoplasm, unspecified: Secondary | ICD-10-CM

## 2015-03-03 DIAGNOSIS — Z825 Family history of asthma and other chronic lower respiratory diseases: Secondary | ICD-10-CM

## 2015-03-03 DIAGNOSIS — Z79899 Other long term (current) drug therapy: Secondary | ICD-10-CM | POA: Diagnosis not present

## 2015-03-03 DIAGNOSIS — Z888 Allergy status to other drugs, medicaments and biological substances status: Secondary | ICD-10-CM | POA: Diagnosis not present

## 2015-03-03 DIAGNOSIS — N179 Acute kidney failure, unspecified: Secondary | ICD-10-CM | POA: Diagnosis present

## 2015-03-03 DIAGNOSIS — D696 Thrombocytopenia, unspecified: Secondary | ICD-10-CM | POA: Diagnosis present

## 2015-03-03 DIAGNOSIS — R7401 Elevation of levels of liver transaminase levels: Secondary | ICD-10-CM | POA: Diagnosis present

## 2015-03-03 DIAGNOSIS — Z79891 Long term (current) use of opiate analgesic: Secondary | ICD-10-CM | POA: Diagnosis not present

## 2015-03-03 DIAGNOSIS — I255 Ischemic cardiomyopathy: Secondary | ICD-10-CM | POA: Diagnosis present

## 2015-03-03 DIAGNOSIS — Z955 Presence of coronary angioplasty implant and graft: Secondary | ICD-10-CM | POA: Diagnosis not present

## 2015-03-03 DIAGNOSIS — Z87891 Personal history of nicotine dependence: Secondary | ICD-10-CM

## 2015-03-03 DIAGNOSIS — N183 Chronic kidney disease, stage 3 unspecified: Secondary | ICD-10-CM | POA: Diagnosis present

## 2015-03-03 DIAGNOSIS — R109 Unspecified abdominal pain: Secondary | ICD-10-CM

## 2015-03-03 DIAGNOSIS — R000 Tachycardia, unspecified: Secondary | ICD-10-CM

## 2015-03-03 DIAGNOSIS — R197 Diarrhea, unspecified: Secondary | ICD-10-CM | POA: Diagnosis present

## 2015-03-03 DIAGNOSIS — Z885 Allergy status to narcotic agent status: Secondary | ICD-10-CM

## 2015-03-03 DIAGNOSIS — E861 Hypovolemia: Secondary | ICD-10-CM | POA: Diagnosis present

## 2015-03-03 DIAGNOSIS — J449 Chronic obstructive pulmonary disease, unspecified: Secondary | ICD-10-CM | POA: Diagnosis present

## 2015-03-03 DIAGNOSIS — R1011 Right upper quadrant pain: Secondary | ICD-10-CM | POA: Diagnosis present

## 2015-03-03 DIAGNOSIS — R52 Pain, unspecified: Secondary | ICD-10-CM

## 2015-03-03 DIAGNOSIS — N289 Disorder of kidney and ureter, unspecified: Secondary | ICD-10-CM | POA: Diagnosis not present

## 2015-03-03 DIAGNOSIS — J439 Emphysema, unspecified: Secondary | ICD-10-CM | POA: Diagnosis present

## 2015-03-03 LAB — COMPREHENSIVE METABOLIC PANEL
ALT: 282 U/L — AB (ref 0–53)
AST: 173 U/L — AB (ref 0–37)
Albumin: 3 g/dL — ABNORMAL LOW (ref 3.5–5.2)
Alkaline Phosphatase: 400 U/L — ABNORMAL HIGH (ref 39–117)
Anion gap: 11 (ref 5–15)
BUN: 45 mg/dL — ABNORMAL HIGH (ref 6–23)
CO2: 24 mmol/L (ref 19–32)
CREATININE: 1.97 mg/dL — AB (ref 0.50–1.35)
Calcium: 7.9 mg/dL — ABNORMAL LOW (ref 8.4–10.5)
Chloride: 99 mmol/L (ref 96–112)
GFR, EST AFRICAN AMERICAN: 38 mL/min — AB (ref >90)
GFR, EST NON AFRICAN AMERICAN: 32 mL/min — AB (ref >90)
GLUCOSE: 97 mg/dL (ref 70–99)
Potassium: 4.5 mmol/L (ref 3.5–5.1)
SODIUM: 134 mmol/L — AB (ref 135–145)
Total Bilirubin: 2.6 mg/dL — ABNORMAL HIGH (ref 0.3–1.2)
Total Protein: 6.1 g/dL (ref 6.0–8.3)

## 2015-03-03 LAB — CBC WITH DIFFERENTIAL/PLATELET
Basophils Absolute: 0 10*3/uL (ref 0.0–0.1)
Basophils Relative: 0 % (ref 0–1)
Eosinophils Absolute: 0 10*3/uL (ref 0.0–0.7)
Eosinophils Relative: 0 % (ref 0–5)
HCT: 31.9 % — ABNORMAL LOW (ref 39.0–52.0)
Hemoglobin: 10.5 g/dL — ABNORMAL LOW (ref 13.0–17.0)
Lymphocytes Relative: 12 % (ref 12–46)
Lymphs Abs: 1.2 10*3/uL (ref 0.7–4.0)
MCH: 33.5 pg (ref 26.0–34.0)
MCHC: 32.9 g/dL (ref 30.0–36.0)
MCV: 101.9 fL — AB (ref 78.0–100.0)
MONO ABS: 1.4 10*3/uL — AB (ref 0.1–1.0)
MONOS PCT: 14 % — AB (ref 3–12)
NEUTROS ABS: 7.4 10*3/uL (ref 1.7–7.7)
Neutrophils Relative %: 74 % (ref 43–77)
Platelets: 67 10*3/uL — ABNORMAL LOW (ref 150–400)
RBC: 3.13 MIL/uL — ABNORMAL LOW (ref 4.22–5.81)
RDW: 23.1 % — AB (ref 11.5–15.5)
WBC: 10 10*3/uL (ref 4.0–10.5)

## 2015-03-03 LAB — URINALYSIS, ROUTINE W REFLEX MICROSCOPIC
GLUCOSE, UA: NEGATIVE mg/dL
Hgb urine dipstick: NEGATIVE
Ketones, ur: NEGATIVE mg/dL
Leukocytes, UA: NEGATIVE
NITRITE: NEGATIVE
PH: 5 (ref 5.0–8.0)
Protein, ur: NEGATIVE mg/dL
SPECIFIC GRAVITY, URINE: 1.015 (ref 1.005–1.030)
UROBILINOGEN UA: 1 mg/dL (ref 0.0–1.0)

## 2015-03-03 LAB — I-STAT CHEM 8, ED
BUN: 45 mg/dL — ABNORMAL HIGH (ref 6–23)
Calcium, Ion: 0.92 mmol/L — ABNORMAL LOW (ref 1.13–1.30)
Chloride: 98 mmol/L (ref 96–112)
Creatinine, Ser: 1.8 mg/dL — ABNORMAL HIGH (ref 0.50–1.35)
Glucose, Bld: 95 mg/dL (ref 70–99)
HCT: 35 % — ABNORMAL LOW (ref 39.0–52.0)
Hemoglobin: 11.9 g/dL — ABNORMAL LOW (ref 13.0–17.0)
Potassium: 4.4 mmol/L (ref 3.5–5.1)
Sodium: 133 mmol/L — ABNORMAL LOW (ref 135–145)
TCO2: 20 mmol/L (ref 0–100)

## 2015-03-03 MED ORDER — SODIUM CHLORIDE 0.9 % IJ SOLN
3.0000 mL | Freq: Two times a day (BID) | INTRAMUSCULAR | Status: DC
  Administered 2015-03-04: 3 mL via INTRAVENOUS

## 2015-03-03 MED ORDER — TEMAZEPAM 15 MG PO CAPS
30.0000 mg | ORAL_CAPSULE | Freq: Every evening | ORAL | Status: DC | PRN
  Administered 2015-03-03: 30 mg via ORAL
  Filled 2015-03-03: qty 2

## 2015-03-03 MED ORDER — PREDNISONE 20 MG PO TABS
20.0000 mg | ORAL_TABLET | Freq: Every day | ORAL | Status: DC
  Administered 2015-03-04: 20 mg via ORAL
  Filled 2015-03-03: qty 1

## 2015-03-03 MED ORDER — FENTANYL CITRATE 0.05 MG/ML IJ SOLN
12.5000 ug | INTRAMUSCULAR | Status: DC | PRN
Start: 2015-03-03 — End: 2015-03-04

## 2015-03-03 MED ORDER — ONDANSETRON HCL 4 MG PO TABS: 4.0000 mg | ORAL_TABLET | Freq: Four times a day (QID) | ORAL | Status: DC | PRN

## 2015-03-03 MED ORDER — ALBUTEROL SULFATE (2.5 MG/3ML) 0.083% IN NEBU: 2.5000 mg | INHALATION_SOLUTION | Freq: Four times a day (QID) | RESPIRATORY_TRACT | Status: DC | PRN

## 2015-03-03 MED ORDER — PROCHLORPERAZINE MALEATE 10 MG PO TABS
10.0000 mg | ORAL_TABLET | Freq: Four times a day (QID) | ORAL | Status: DC | PRN
  Filled 2015-03-03: qty 1

## 2015-03-03 MED ORDER — ALBUTEROL SULFATE 108 (90 BASE) MCG/ACT IN AEPB: 2.0000 | INHALATION_SPRAY | Freq: Four times a day (QID) | RESPIRATORY_TRACT | Status: DC | PRN

## 2015-03-03 MED ORDER — ONDANSETRON HCL 4 MG/2ML IJ SOLN: 4.0000 mg | Freq: Four times a day (QID) | INTRAMUSCULAR | Status: DC | PRN

## 2015-03-03 MED ORDER — CITALOPRAM HYDROBROMIDE 20 MG PO TABS
20.0000 mg | ORAL_TABLET | Freq: Every day | ORAL | Status: DC
  Administered 2015-03-04: 20 mg via ORAL
  Filled 2015-03-03: qty 1

## 2015-03-03 MED ORDER — GUAIFENESIN ER 600 MG PO TB12
600.0000 mg | ORAL_TABLET | Freq: Two times a day (BID) | ORAL | Status: DC
  Administered 2015-03-03 – 2015-03-04 (×2): 600 mg via ORAL
  Filled 2015-03-03 (×4): qty 1

## 2015-03-03 MED ORDER — CALCIUM CARBONATE-VITAMIN D 500-200 MG-UNIT PO TABS
1.0000 | ORAL_TABLET | Freq: Every day | ORAL | Status: DC
  Administered 2015-03-04: 1 via ORAL
  Filled 2015-03-03: qty 1

## 2015-03-03 MED ORDER — SODIUM CHLORIDE 0.9 % IV BOLUS (SEPSIS)
500.0000 mL | Freq: Once | INTRAVENOUS | Status: AC
  Administered 2015-03-03: 500 mL via INTRAVENOUS

## 2015-03-03 MED ORDER — BUDESONIDE-FORMOTEROL FUMARATE 160-4.5 MCG/ACT IN AERO
2.0000 | INHALATION_SPRAY | Freq: Two times a day (BID) | RESPIRATORY_TRACT | Status: DC
  Administered 2015-03-03 – 2015-03-04 (×2): 2 via RESPIRATORY_TRACT
  Filled 2015-03-03: qty 6

## 2015-03-03 MED ORDER — SODIUM CHLORIDE 0.9 % IV SOLN
INTRAVENOUS | Status: DC
  Administered 2015-03-03: 75 mL/h via INTRAVENOUS

## 2015-03-03 NOTE — ED Notes (Signed)
Pt to ct on stretcher via transporter

## 2015-03-03 NOTE — Telephone Encounter (Signed)
Patient called to notify MD Julien Nordmann and RN Diane that he is currently in the ED due to unknown source of pain. Message forwarded to MD and RN.

## 2015-03-03 NOTE — Progress Notes (Signed)
Todd Villanueva [id number] Admission Data: 03/03/2015 6:03 PM Attending Provider: Louellen Molder, MD  AQT:[id number] [name], MD Consults/ Treatment Team:    Todd Villanueva is a 74 y.o. male patient admitted from ED awake, alert  & orientated  X 3,  Full Code, VSS - Blood pressure 148/82, pulse 104, temperature 98 F (36.7 C), temperature source Oral, resp. rate 18, height 5\' 6"  (1.676 m), weight 81.194 kg (179 lb), SpO2 98 %., , no c/o shortness of breath, no c/o chest pain, no distress noted. Tele #7 placed.   IV site WDL:  antecubital left, condition patent and no redness with a transparent dsg that's clean dry and intact.  Allergies:   Allergies  Allergen Reactions  . Hydrocodone-Acetaminophen Itching  . Morphine And Related Other (See Comments)    Hallucinations, headache  . Tramadol     Pt states will not take med b/c it does not work   . Zolpidem Tartrate Other (See Comments)    nightmares     Past Medical History  Diagnosis Date  . Hypertension   . lung ca dx'd 02/2011    xrt/chemo comp 03/30/11, stage IIIA  . Hyperlipidemia   . 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 History of angina   . CAD (coronary artery disease)   . Systolic CHF        Pt orientation to unit, room and routine. Information packet given to patient/family and safety video watched.  Admission INP armband ID verified with patient/family, and in place. SR up x 2, fall risk assessment complete with Patient and family verbalizing understanding of risks associated with falls. Pt verbalizes an understanding of how to use the call bell and to call for help before getting out of bed.  Skin, clean-dry- intact without evidence of bruising, or skin tears.   No evidence of skin break down noted on exam.      Will cont to monitor and assist as needed.  Dayle Points, RN 03/03/2015 6:03 PM

## 2015-03-03 NOTE — ED Notes (Signed)
Ordered heart healthy dinner tray.

## 2015-03-03 NOTE — ED Provider Notes (Signed)
CSN: [id number]     Arrival date & time 03/03/15  1044 History   First MD Initiated Contact with Patient 03/03/15 1046     Chief Complaint  Patient presents with  . Abdominal Pain     (Consider location/radiation/quality/duration/timing/severity/associated sxs/prior Treatment) Patient is a 74 y.o. male presenting with abdominal pain. The history is provided by the patient (pt complains of some abd pain with diarhea).  Abdominal Pain Pain location:  Epigastric Pain quality: aching   Pain radiates to:  Does not radiate Pain severity:  Moderate Onset quality:  Gradual Timing:  Intermittent Progression:  Waxing and waning Chronicity:  New Context: not alcohol use   Associated symptoms: no chest pain, no cough, no diarrhea, no fatigue and no hematuria     Past Medical History  Diagnosis Date  . Hypertension   . lung ca dx'd 02/2011    xrt/chemo comp 03/30/11, stage IIIA  . Hyperlipidemia   . 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 History of angina   . CAD (coronary artery disease)   . Systolic CHF    Past Surgical History  Procedure Laterality Date  . Right vats, rt thoracotomy, rt middle and rt lower  lobectomy  06/10/2011    Dr Arlyce Dice  . Knee arthroscopy    . Tonsillectomy    . Coronary stent placement  JULY,AUGUST 2011  . Orif femoral neck fracture w/ dhs    . Left heart catheterization with coronary angiogram N/A 06/17/2014    Procedure: LEFT HEART CATHETERIZATION WITH CORONARY ANGIOGRAM;  Surgeon: Candee Furbish, MD;  Location: [facility] CATH LAB;  Service: Cardiovascular;  Laterality: N/A;   Family History  Problem Relation Age of Onset  . Cancer Father     multiple myeloma  . COPD Sister     emphysema- smoked  . Cancer Brother     multiple myeloma   History  Substance Use Topics  . Smoking status: Former Smoker -- 0.50 packs/day for 53 years    Types: Cigarettes    Quit date: 10/05/2013  .  Smokeless tobacco: Never Used  . Alcohol Use: No     Comment: occasionally    Review of Systems  Constitutional: Negative for appetite change and fatigue.  HENT: Negative for congestion, ear discharge and sinus pressure.   Eyes: Negative for discharge.  Respiratory: Negative for cough.   Cardiovascular: Negative for chest pain.  Gastrointestinal: Positive for abdominal pain. Negative for diarrhea.       Diarhea  Genitourinary: Negative for frequency and hematuria.  Musculoskeletal: Negative for back pain.  Skin: Negative for rash.  Neurological: Negative for seizures and headaches.  Psychiatric/Behavioral: Negative for hallucinations.      Allergies  Hydrocodone-acetaminophen; Morphine and related; Tramadol; and Zolpidem tartrate  Home Medications   Prior to Admission medications   Medication Sig Start Date End Date Taking? Authorizing Provider  Albuterol Sulfate (PROAIR RESPICLICK) 563 (90 BASE) MCG/ACT AEPB Inhale 2 puffs into the lungs every 6 (six) hours as needed. Patient taking differently: Inhale 2 puffs into the lungs 3 (three) times daily.  11/14/14  Yes [name], MD  Alum & Mag Hydroxide-Simeth (MAGIC MOUTHWASH W/LIDOCAINE) SOLN Take 5 mLs by mouth 4 (four) times daily as needed for mouth pain. 01/06/15  Yes [name], MD  bevacizumab (AVASTIN) 1.25 mg/0.1 mL SOLN 1.25 mg by Intravitreal route every 30 (thirty) days.   Yes Chrystie Nose  Zigmund Daniel, MD  budesonide-formoterol [facility]) 160-4.5 MCG/ACT inhaler Inhale 2 puffs into the lungs 2 (two) times daily.   Yes Historical Provider, MD  Calcium Carbonate-Vitamin D (CALCIUM-VITAMIN D) 500-200 MG-UNIT per tablet Take 1 tablet by mouth daily.    Yes Historical Provider, MD  citalopram (CELEXA) 20 MG tablet Take 20 mg by mouth daily.   Yes Historical Provider, MD  furosemide (LASIX) 40 MG tablet Take 1 tablet (40 mg total) by mouth daily. Take as directed for the next 3 days. Patient taking differently: Take 40 mg by mouth  daily.  02/09/15  Yes Silver Huguenin Elgergawy, MD  GuaiFENesin (MUCINEX PO) Take 600 mg by mouth 2 (two) times daily. 11/24/14  Yes Historical Provider, MD  levalbuterol Penne Lash) 0.63 MG/3ML nebulizer solution Take 0.63 mg by nebulization 2 (two) times daily.    Yes Historical Provider, MD  omeprazole (PRILOSEC) 20 MG capsule Take 40 mg by mouth daily.    Yes Historical Provider, MD  prochlorperazine (COMPAZINE) 10 MG tablet Take 1 tablet (10 mg total) by mouth every 6 (six) hours as needed for nausea or vomiting. 10/16/14  Yes [name], MD  simvastatin (ZOCOR) 40 MG tablet Take 40 mg by mouth every Monday, Wednesday, and Friday.    Yes Historical Provider, MD  temazepam (RESTORIL) 30 MG capsule Take 30 mg by mouth at bedtime as needed for sleep.   Yes Josetta Huddle, MD  HYDROmorphone (DILAUDID) 2 MG tablet Take 1 tablet (2 mg total) by mouth every 6 (six) hours as needed for severe pain. Patient not taking: Reported on 01/19/2015 10/07/14   [name], MD  lisinopril (PRINIVIL,ZESTRIL) 10 MG tablet Take 1 tablet (10 mg total) by mouth daily. 02/09/15   Silver Huguenin Elgergawy, MD  predniSONE (DELTASONE) 10 MG tablet Take 20 mg by mouth daily.     Historical Provider, MD   BP 123/77 mmHg  Pulse 106  Temp(Src) 97.7 F (36.5 C) (Oral)  Resp 20  Ht 5' 6"  (1.676 m)  Wt 179 lb (81.194 kg)  BMI 28.91 kg/m2  SpO2 96% Physical Exam  Constitutional: He is oriented to person, place, and time. He appears well-developed.  HENT:  Head: Normocephalic.  Eyes: Conjunctivae and EOM are normal. No scleral icterus.  Neck: Neck supple. No thyromegaly present.  Cardiovascular: Normal rate and regular rhythm.  Exam reveals no gallop and no friction rub.   No murmur heard. Pulmonary/Chest: No stridor. He has no wheezes. He has no rales. He exhibits no tenderness.  Abdominal: He exhibits no distension. There is tenderness. There is no rebound.  Moderate ruq tenderness  Musculoskeletal: Normal range of motion. He  exhibits no edema.  Lymphadenopathy:    He has no cervical adenopathy.  Neurological: He is oriented to person, place, and time. He exhibits normal muscle tone. Coordination normal.  Skin: No rash noted. No erythema.  Psychiatric: He has a normal mood and affect. His behavior is normal.    ED Course  Procedures (including critical care time) Labs Review Labs Reviewed  COMPREHENSIVE METABOLIC PANEL - Abnormal; Notable for the following:    Sodium 134 (*)    BUN 45 (*)    Creatinine, Ser 1.97 (*)    Calcium 7.9 (*)    Albumin 3.0 (*)    AST 173 (*)    ALT 282 (*)    Alkaline Phosphatase 400 (*)    Total Bilirubin 2.6 (*)    GFR calc non Af Amer 32 (*)    GFR  calc Af Amer 38 (*)    All other components within normal limits  CBC WITH DIFFERENTIAL/PLATELET - Abnormal; Notable for the following:    RBC 3.13 (*)    Hemoglobin 10.5 (*)    HCT 31.9 (*)    MCV 101.9 (*)    RDW 23.1 (*)    Platelets 67 (*)    Monocytes Relative 14 (*)    Monocytes Absolute 1.4 (*)    All other components within normal limits  I-STAT CHEM 8, ED - Abnormal; Notable for the following:    Sodium 133 (*)    BUN 45 (*)    Creatinine, Ser 1.80 (*)    Calcium, Ion 0.92 (*)    Hemoglobin 11.9 (*)    HCT 35.0 (*)    All other components within normal limits    Imaging Review Ct Abdomen Pelvis Wo Contrast  03/03/2015   CLINICAL DATA:  Right lower quadrant abdominal pain . 100 therapy for right lung cancer. Renal insufficiency. No IV contrast.  EXAM: CT ABDOMEN AND PELVIS WITHOUT CONTRAST  TECHNIQUE: Multidetector CT imaging of the abdomen and pelvis was performed following the standard protocol without IV contrast.  COMPARISON:  CT 10/15/14  FINDINGS: Lower chest: Postsurgical change the right hemi thorax volume loss. Consolidation in the medial right lower lobe adjacent to surgical clips consistent with posttherapy collapse / consolidation. No change from prior. There is a loculated fluid collection in the  inferior posterior right hemi thorax measuring 5.4 by 3.8 cm compared to 5.8 x 3.7 cm. There is a mild reticular pattern in the peripheral left lower lobe. No nodularity  Hepatobiliary: Several hypodense lesions within liver which cannot further characterized on this noncontrast exam. There were previous hypermetabolic liver metastasis on PET-CT scan of 09/30/2014. In comparison to this region on noncontrast CT several lesions are smaller.  Pancreas: Pancreas is normal. No ductal dilatation. No pancreatic inflammation.  Spleen: Normal normal spleen.  Adrenals/urinary tract: Normal adrenal glands are normal. The kidneys ureters bladder.  Stomach/Bowel: Stomach, small bowel, and colon are normal.  Vascular/Lymphatic: Abdominal aorta is normal caliber with atherosclerotic calcification. There is no retroperitoneal or periportal lymphadenopathy. No pelvic lymphadenopathy.  Reproductive: Prostate gland is normal. Small inguinal hernias noted.  Musculoskeletal: No aggressive osseous lesion. Thoracotomy defect in the posterior right hemi thorax ribs. Sclerotic change in the right humeral head is felt to be degenerative.  IMPRESSION: 1. No acute findings abdomen pelvis. 2. Hepatic metastasis difficult to assess on this noncontrast exam. 3. No nephrolithiasis or ureterolithiasis. 4. Normal appendix. 5. Postsurgical change in the right hemi thorax with consolidation and loculated fluid collection. These findings are not significantly changed 10/15/2014. 6. Stable sclerotic change in the right femoral head. Cannot exclude avascular necrosis.   Electronically Signed   By: Suzy Bouchard M.D.   On: 03/03/2015 13:20   Dg Chest Port 1 View  03/03/2015   CLINICAL DATA:  Right-sided pain  EXAM: PORTABLE CHEST - 1 VIEW  COMPARISON:  02/06/2015  FINDINGS: Cardiac shadow is stable. The left lung remains well aerated. Volume loss on the right with mediastinal shift to the right is noted. Old rib fractures are again seen on the  right. A right-sided pleural effusion is again noted and stable.  IMPRESSION: No change from the prior exam.  No acute abnormality noted.   Electronically Signed   By: Inez Catalina M.D.   On: 03/03/2015 11:58     EKG Interpretation   Date/Time:  Tuesday March 03 2015 10:54:30 EDT Ventricular Rate:  132 PR Interval:    QRS Duration: 109 QT Interval:  329 QTC Calculation: 487 R Axis:   10 Text Interpretation:  Junctional tachycardia Inferior infarct, old  Probable anterolateral infarct, old Confirmed by Tola Meas  MD, Zhane Donlan  [phone]) on 03/03/2015 3:40:11 PM      MDM   Final diagnoses:  Tachycardia  Pain    Admit to med.  Surgery to consult    Milton Ferguson, MD 03/03/15 [phone]

## 2015-03-03 NOTE — H&P (Signed)
[facility] History and Physical  Todd Villanueva ZOX:[id number] DOB: Sep 16, 1941 DOA: 03/03/2015  Referring physician: Dr. 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 Julien Nordmann, COPD with emphysema, CAD with history of systolic CHF, hypertension who presented to the ED with acute onset of right upper quadrant pain since last night. Patient reports sharp right upper quadrant pain radiating across the abdomen and to the back without any aggravating or relieving factors. Reports some nausea but no vomiting. He also had 6-7 episodes of diarrhea since yesterday. Also reports noticing dark urine since past 2 days. Denies fever, chills, headache, chest pain, palpitations, shortness of breath.   Denies change in weight or appetite. His last chemotherapy was 3 weeks back and is due tomorrow.    Review of Systems:  Constitutional: Denies fever, chills, diaphoresis, appetite change and fatigue.  HEENT: Denies visual or hearing symptoms, congestion, difficulty swallowing, neck pain or stiffness Respiratory: Denies SOB, DOE, cough, chest tightness,  and wheezing.   Cardiovascular: Denies chest pain, palpitations and leg swelling.  Gastrointestinal: Nausea, abdominal pain, diarrhea , Denies  vomiting, constipation, blood in stool and abdominal distention.  Genitourinary: Denies dysuria, urgency, frequency, hematuria, flank pain and difficulty urinating.  Endocrine: Denies: hot or cold intolerance,  polyuria, polydipsia. Musculoskeletal: Denies myalgias, back pain, joint pain or swelling  Skin: Denies pallor, rash and wound.  Neurological: Denies dizziness, seizures, syncope, weakness, light-headedness, numbness and headaches.  Hematological: Denies  adenopathy.  Psychiatric/Behavioral: Denies confusion  Past Medical History  Diagnosis Date  . Hypertension   . lung ca dx'd 02/2011    xrt/chemo comp 03/30/11, stage IIIA  . Hyperlipidemia   . 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 History of angina   . CAD (coronary artery disease)   . Systolic CHF    Past Surgical History  Procedure Laterality Date  . Right vats, rt thoracotomy, rt middle and rt lower  lobectomy  06/10/2011    Dr Arlyce Dice  . Knee arthroscopy    . Tonsillectomy    . Coronary stent placement  JULY,AUGUST 2011  . Orif femoral neck fracture w/ dhs    . Left heart catheterization with coronary angiogram N/A 06/17/2014    Procedure: LEFT HEART CATHETERIZATION WITH CORONARY ANGIOGRAM;  Surgeon: Candee Furbish, MD;  Location: [facility] CATH LAB;  Service: Cardiovascular;  Laterality: N/A;   Social History:  reports that he quit smoking about 16 months ago. His smoking use included Cigarettes. He has a 26.5 pack-year smoking history. He has never used smokeless tobacco. He reports that he does not drink alcohol or use illicit drugs.  Allergies  Allergen Reactions  . Hydrocodone-Acetaminophen Itching  . Morphine And Related Other (See Comments)    Hallucinations, headache  . Tramadol     Pt states will not take med b/c it does not work   . Zolpidem Tartrate Other (See Comments)    nightmares    Family History  Problem Relation Age of Onset  . Cancer Father     multiple myeloma  . COPD Sister     emphysema- smoked  . Cancer Brother     multiple myeloma  Prior to Admission medications   Medication Sig Start Date End Date Taking? Authorizing Provider  Albuterol Sulfate (PROAIR RESPICLICK) 623 (90 BASE) MCG/ACT AEPB Inhale 2 puffs into the lungs every 6 (six) hours as needed. Patient taking differently: Inhale 2 puffs into the lungs 3 (three) times daily.  11/14/14  Yes [name], MD  Alum & Mag  Hydroxide-Simeth (MAGIC MOUTHWASH W/LIDOCAINE) SOLN Take 5 mLs by mouth 4 (four) times daily as needed for mouth pain. 01/06/15  Yes [name], MD  bevacizumab (AVASTIN) 1.25 mg/0.1 mL SOLN 1.25 mg by Intravitreal route every 30 (thirty) days.   Yes Hayden Pedro, MD  budesonide-formoterol [facility]) 160-4.5 MCG/ACT inhaler Inhale 2 puffs into the lungs 2 (two) times daily.   Yes Historical Provider, MD  Calcium Carbonate-Vitamin D (CALCIUM-VITAMIN D) 500-200 MG-UNIT per tablet Take 1 tablet by mouth daily.    Yes Historical Provider, MD  citalopram (CELEXA) 20 MG tablet Take 20 mg by mouth daily.   Yes Historical Provider, MD  furosemide (LASIX) 40 MG tablet Take 1 tablet (40 mg total) by mouth daily. Take as directed for the next 3 days. Patient taking differently: Take 40 mg by mouth daily.  02/09/15  Yes Silver Huguenin Elgergawy, MD  GuaiFENesin (MUCINEX PO) Take 600 mg by mouth 2 (two) times daily. 11/24/14  Yes Historical Provider, MD  levalbuterol Penne Lash) 0.63 MG/3ML nebulizer solution Take 0.63 mg by nebulization 2 (two) times daily.    Yes Historical Provider, MD  omeprazole (PRILOSEC) 20 MG capsule Take 40 mg by mouth daily.    Yes Historical Provider, MD  prochlorperazine (COMPAZINE) 10 MG tablet Take 1 tablet (10 mg total) by mouth every 6 (six) hours as needed for nausea or vomiting. 10/16/14  Yes [name], MD  simvastatin (ZOCOR) 40 MG tablet Take 40 mg by mouth every Monday, Wednesday, and Friday.    Yes Historical Provider, MD  temazepam (RESTORIL) 30 MG capsule Take 30 mg by mouth at bedtime as needed for sleep.   Yes Josetta Huddle, MD  HYDROmorphone (DILAUDID) 2 MG tablet Take 1 tablet (2 mg total) by mouth every 6 (six) hours as needed for severe pain. Patient not taking: Reported on 01/19/2015 10/07/14   [name], MD  lisinopril (PRINIVIL,ZESTRIL) 10 MG tablet Take 1 tablet (10 mg total) by mouth daily. 02/09/15   Silver Huguenin Elgergawy, MD  predniSONE (DELTASONE) 10 MG  tablet Take 20 mg by mouth daily.     Historical Provider, MD     Physical Exam:  Filed Vitals:   03/03/15 1545 03/03/15 1550 03/03/15 1553 03/03/15 1630  BP: 124/76 124/76  140/83  Pulse:  106  108  Temp:   98.8 F (37.1 C)   TempSrc:      Resp:  20  23  Height:      Weight:      SpO2:  97%  96%    Constitutional: Vital signs reviewed.  Early male lying in bed in no acute distress HEENT: Icteric,  no pallor, no icterus, moist oral mucosa, no cervical lymphadenopathy Cardiovascular: RRR, S1 normal, S2 normal, no MRG Chest: CTAB, no wheezes, rales, or rhonchi Abdominal: Soft. Non-tender, non-distended, bowel sounds are normal,  Ext: warm, no edema Neurological: Alert and oriented, nonfocal  Labs on Admission:  Basic Metabolic Panel:  Recent Labs Lab 02/25/15 1043 03/03/15 1105 03/03/15 1111  NA 140 134* 133*  K 4.4 4.5 4.4  CL  --  99 98  CO2 18* 24  --  GLUCOSE 116 97 95  BUN 42.8* 45* 45*  CREATININE 1.6* 1.97* 1.80*  CALCIUM 7.8* 7.9*  --    Liver Function Tests:  Recent Labs Lab 02/25/15 1043 03/03/15 1105  AST 76* 173*  ALT 109* 282*  ALKPHOS 204* 400*  BILITOT 0.51 2.6*  PROT 6.9 6.1  ALBUMIN 3.4* 3.0*   No results for input(s): LIPASE, AMYLASE in the last 168 hours. No results for input(s): AMMONIA in the last 168 hours. CBC:  Recent Labs Lab 02/25/15 1042 03/03/15 1105 03/03/15 1111  WBC 12.5* 10.0  --   NEUTROABS 11.0* 7.4  --   HGB 10.7* 10.5* 11.9*  HCT 32.4* 31.9* 35.0*  MCV 100.6* 101.9*  --   PLT 83* 67*  --    Cardiac Enzymes: No results for input(s): CKTOTAL, CKMB, CKMBINDEX, TROPONINI in the last 168 hours. BNP: Invalid input(s): POCBNP CBG: No results for input(s): GLUCAP in the last 168 hours.  Radiological Exams on Admission: Ct Abdomen Pelvis Wo Contrast  03/03/2015   CLINICAL DATA:  Right lower quadrant abdominal pain . 100 therapy for right lung cancer. Renal insufficiency. No IV contrast.  EXAM: CT ABDOMEN AND  PELVIS WITHOUT CONTRAST  TECHNIQUE: Multidetector CT imaging of the abdomen and pelvis was performed following the standard protocol without IV contrast.  COMPARISON:  CT 10/15/14  FINDINGS: Lower chest: Postsurgical change the right hemi thorax volume loss. Consolidation in the medial right lower lobe adjacent to surgical clips consistent with posttherapy collapse / consolidation. No change from prior. There is a loculated fluid collection in the inferior posterior right hemi thorax measuring 5.4 by 3.8 cm compared to 5.8 x 3.7 cm. There is a mild reticular pattern in the peripheral left lower lobe. No nodularity  Hepatobiliary: Several hypodense lesions within liver which cannot further characterized on this noncontrast exam. There were previous hypermetabolic liver metastasis on PET-CT scan of 09/30/2014. In comparison to this region on noncontrast CT several lesions are smaller.  Pancreas: Pancreas is normal. No ductal dilatation. No pancreatic inflammation.  Spleen: Normal normal spleen.  Adrenals/urinary tract: Normal adrenal glands are normal. The kidneys ureters bladder.  Stomach/Bowel: Stomach, small bowel, and colon are normal.  Vascular/Lymphatic: Abdominal aorta is normal caliber with atherosclerotic calcification. There is no retroperitoneal or periportal lymphadenopathy. No pelvic lymphadenopathy.  Reproductive: Prostate gland is normal. Small inguinal hernias noted.  Musculoskeletal: No aggressive osseous lesion. Thoracotomy defect in the posterior right hemi thorax ribs. Sclerotic change in the right humeral head is felt to be degenerative.  IMPRESSION: 1. No acute findings abdomen pelvis. 2. Hepatic metastasis difficult to assess on this noncontrast exam. 3. No nephrolithiasis or ureterolithiasis. 4. Normal appendix. 5. Postsurgical change in the right hemi thorax with consolidation and loculated fluid collection. These findings are not significantly changed 10/15/2014. 6. Stable sclerotic change in  the right femoral head. Cannot exclude avascular necrosis.   Electronically Signed   By: Suzy Bouchard M.D.   On: 03/03/2015 13:20   US Abdomen Complete  03/03/2015   CLINICAL DATA:  74 year old male with abdominal pain. Initial encounter. History of CT-guided liver biopsy in November 2015 reportedly revealing metastatic disease. Lung cancer recurrence.  EXAM: ULTRASOUND ABDOMEN COMPLETE  COMPARISON:  Noncontrast CT Abdomen and Pelvis 1210 hr today reported separately.  FINDINGS: Gallbladder: Contracted. Up to mild gallbladder wall thickening at 3 mm. No sonographic Murphy sign elicited. No echogenic sludge or stones identified.  Common bile duct: Diameter: 5 mm, normal  Liver: Miliary type hepatic metastases are  poorly visible with sonographic technique. These were more evident on the noncontrast study earlier today. There is a mildly lobulated hepatic contour. No intrahepatic biliary ductal dilatation identified.  IVC: No abnormality visualized.  Pancreas: Incompletely visualized due to overlying bowel gas, visualized portions within normal limits.  Spleen: Size and appearance within normal limits.  Right Kidney: Length: 10.3 cm No hydronephrosis or renal mass. Increased cortical echogenicity.  Left Kidney: Length: 10.3 cm. No hydronephrosis or left renal mass. Normal to increased cortical echogenicity.  Abdominal aorta: No aneurysm visualized.  Other findings: None.  IMPRESSION: 1. Contracted gallbladder with mild gallbladder wall thickening, but no cholelithiasis or sonographic Murphy sign elicited to strongly suggest acute cholecystitis. 2. Liver metastatic disease poorly visible by ultrasound. 3. Suggestion of chronic medical renal disease.   Electronically Signed   By: Genevie Ann M.D.   On: 03/03/2015 15:56   Dg Chest Port 1 View  03/03/2015   CLINICAL DATA:  Right-sided pain  EXAM: PORTABLE CHEST - 1 VIEW  COMPARISON:  02/06/2015  FINDINGS: Cardiac shadow is stable. The left lung remains well aerated.  Volume loss on the right with mediastinal shift to the right is noted. Old rib fractures are again seen on the right. A right-sided pleural effusion is again noted and stable.  IMPRESSION: No change from the prior exam.  No acute abnormality noted.   Electronically Signed   By: Inez Catalina M.D.   On: 03/03/2015 11:58    EKG: Junctional tachycardia at 132 with no ST-T changes  Assessment/Plan  Principal Problem:   Acute epigastric pain with transaminitis CT scan of the abdomen and pelvis and ultrasound of the abdomen without any findings of cholecystitis or CBD obstruction. -We'll admit to telemetry. Pain control with when necessary fentanyl.  -Full liquid for now. -ED physician discussed with surgery ( Dr Dalbert Batman) who reviewed the imaging and did not feel this was surgical given absence of obstruction and resolution of his pain symptoms -This could likely be related to his hepatic metastases. Will check hepatitis panel. -I have requested [facility] (Dr. Oletta Lamas ) consult and he will see the patient. -Monitor LFTs closely.  Active Problems: Diarrhea Check stool for C. Difficile.    COPD with emphysema Stable. Continue home inhaler and when necessary nebulizer    Cancer of lower lobe of right lung with liver metastases Follows with Dr. Julien Nordmann. On  weekly carboplatin and Paclitaxil . status post VATS with right thoracotomy right middle and right lower lobectomies in 2012 S[facility]hu for next chemotherapy tomorrow. We'll add Dr. Julien Nordmann as consult. Please notify him in the morning.  thrombocytopenia Likely related to liver metastasis and unchanged from previous labs. Monitor  Acute on chronic any disease stage III Mild worsening of renal function. Will monitor on gentle hydration. Hold Lasix for now    Cardiomyopathy, ischemic EF 35-40% by echo 01/28/14 Patient appears hypovolemic clinically. Will hold Lasix and monitor with gentle hydration overnight.  Tachycardia Likely due to pain and  dehydration. Monitor on telemetry with gentle hydration.        Diet: Full liquid  DVT prophylaxis: SCD   Code Status: full code Family Communication: discussed with wife at bedside Disposition Plan: admit to [place], [place] [facility] Pager [phone]  Total time spent on admission :60 minutes  If 7PM-7AM, please contact night-coverage [email or web address] Password [facility] 03/03/2015, 5:31 PM

## 2015-03-03 NOTE — ED Notes (Signed)
Pt complaining of right lower abd pain going into right back. Pt complaining of diarrhea as well. Pt states he is nauseated, but not vomited.

## 2015-03-03 NOTE — ED Notes (Signed)
Code stemi on this pt was NOT paged out.

## 2015-03-03 NOTE — ED Provider Notes (Signed)
4:50 PM patient briefly examined by me. He is presently asymptomatic. Denies abdominal pain on exam abdomen is soft and nontender. I recontacted Dr.Dhungel, patient is not felt to have acute surgical problem. I suggested that Dr.Dhungel contact gastroenterology instead of surgery. He will consult gastroenterology service  Orlie Dakin, MD 03/03/15 [phone]

## 2015-03-03 NOTE — ED Notes (Signed)
Paged out code stemi

## 2015-03-03 NOTE — Consult Note (Signed)
[facility] CONSULT Reason for consult: right upper quadrant pain and abnormal liver test Referring Physician: [facility]. PCP: Dr. [name]. Primary G.I.: Dr. Alfonse Spruce Todd Villanueva is an 74 y.o. male.  HPI: he has a history of non-small cell carcinoma of the lung and is undergone previous VATs surgery by [name] to remove the right upper lobe and right middle lobe. He is currently followed by Dr. Earlie Server. He had recent chemotherapy. Workup is included PET scans in the past that is shown metastatic disease to the liver. Patients last chemotherapy was about 3 weeks ago. He had been doing reasonably well other than some anemia requiring transfusion. Approximately 4 days ago he began to have loose stools. 2 days ago he had sudden onset of right upper quadrant pain. His appetite is been quite poor and he forced himself to eat and did not notice any worsening of the pain with eating. He came into the hospital with the symptoms. Lab work revealed elevated liver test with total bilirubin of 2.6 and elevated AST and ALT with elevated alkaline phosphatase of 400. Just 3 weeks ago his transaminases were normal and alkaline phosphatase was just above normal at 143 in Dr. [name] office. Non-contrasted CT showed some metastatic disease with no other significant findings that was a non-contrasted study. Ultrasound showed a contracted gallbladder with minimal thickening, no Murphy sign in no sludge stones in the gallbladder identified. Metastatic disease was seen in the liver. Of note the CBD was normal 5 mm and there were no dilated ducts. The patient notes that his pain is a little better and he is still having diarrhea.  Past Medical History  Diagnosis Date  . Hypertension   . lung ca dx'd 02/2011    xrt/chemo comp 03/30/11, stage IIIA  . Hyperlipidemia   . 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 History of angina   . CAD  (coronary artery disease)   . Systolic CHF     Past Surgical History  Procedure Laterality Date  . Right vats, rt thoracotomy, rt middle and rt lower  lobectomy  06/10/2011    Dr Arlyce Dice  . Knee arthroscopy    . Tonsillectomy    . Coronary stent placement  JULY,AUGUST 2011  . Orif femoral neck fracture w/ dhs    . Left heart catheterization with coronary angiogram N/A 06/17/2014    Procedure: LEFT HEART CATHETERIZATION WITH CORONARY ANGIOGRAM;  Surgeon: Candee Furbish, MD;  Location: [facility] CATH LAB;  Service: Cardiovascular;  Laterality: N/A;    Family History  Problem Relation Age of Onset  . Cancer Father     multiple myeloma  . COPD Sister     emphysema- smoked  . Cancer Brother     multiple myeloma    Social History:  reports that he quit smoking about 16 months ago. His smoking use included Cigarettes. He has a 26.5 pack-year smoking history. He has never used smokeless tobacco. He reports that he does not drink alcohol or use illicit drugs.  Allergies:  Allergies  Allergen Reactions  . Hydrocodone-Acetaminophen Itching  . Morphine And Related Other (See Comments)    Hallucinations, headache  . Tramadol     Pt states will not take med b/c it does not work   . Zolpidem Tartrate Other (See Comments)    nightmares    Medications; Prior to Admission medications   Medication Sig  Start Date End Date Taking? Authorizing Provider  Albuterol Sulfate (PROAIR RESPICLICK) 161 (90 BASE) MCG/ACT AEPB Inhale 2 puffs into the lungs every 6 (six) hours as needed. Patient taking differently: Inhale 2 puffs into the lungs 3 (three) times daily.  11/14/14  Yes [name], MD  Alum & Mag Hydroxide-Simeth (MAGIC MOUTHWASH W/LIDOCAINE) SOLN Take 5 mLs by mouth 4 (four) times daily as needed for mouth pain. 01/06/15  Yes [name], MD  bevacizumab (AVASTIN) 1.25 mg/0.1 mL SOLN 1.25 mg by Intravitreal route every 30 (thirty) days.   Yes Hayden Pedro, MD  budesonide-formoterol [facility])  160-4.5 MCG/ACT inhaler Inhale 2 puffs into the lungs 2 (two) times daily.   Yes Historical Provider, MD  Calcium Carbonate-Vitamin D (CALCIUM-VITAMIN D) 500-200 MG-UNIT per tablet Take 1 tablet by mouth daily.    Yes Historical Provider, MD  citalopram (CELEXA) 20 MG tablet Take 20 mg by mouth daily.   Yes Historical Provider, MD  furosemide (LASIX) 40 MG tablet Take 1 tablet (40 mg total) by mouth daily. Take as directed for the next 3 days. Patient taking differently: Take 40 mg by mouth daily.  02/09/15  Yes Silver Huguenin Elgergawy, MD  GuaiFENesin (MUCINEX PO) Take 600 mg by mouth 2 (two) times daily. 11/24/14  Yes Historical Provider, MD  levalbuterol Penne Lash) 0.63 MG/3ML nebulizer solution Take 0.63 mg by nebulization 2 (two) times daily.    Yes Historical Provider, MD  omeprazole (PRILOSEC) 20 MG capsule Take 40 mg by mouth daily.    Yes Historical Provider, MD  prochlorperazine (COMPAZINE) 10 MG tablet Take 1 tablet (10 mg total) by mouth every 6 (six) hours as needed for nausea or vomiting. 10/16/14  Yes [name], MD  simvastatin (ZOCOR) 40 MG tablet Take 40 mg by mouth every Monday, Wednesday, and Friday.    Yes Historical Provider, MD  temazepam (RESTORIL) 30 MG capsule Take 30 mg by mouth at bedtime as needed for sleep.   Yes Josetta Huddle, MD  HYDROmorphone (DILAUDID) 2 MG tablet Take 1 tablet (2 mg total) by mouth every 6 (six) hours as needed for severe pain. Patient not taking: Reported on 01/19/2015 10/07/14   [name], MD  lisinopril (PRINIVIL,ZESTRIL) 10 MG tablet Take 1 tablet (10 mg total) by mouth daily. 02/09/15   Silver Huguenin Elgergawy, MD  predniSONE (DELTASONE) 10 MG tablet Take 20 mg by mouth daily.     Historical Provider, MD   . budesonide-formoterol  2 puff Inhalation BID  . [START ON 03/04/2015] calcium-vitamin D  1 tablet Oral Daily  . [START ON 03/04/2015] citalopram  20 mg Oral Daily  . guaiFENesin  600 mg Oral BID  . [START ON 03/04/2015] predniSONE  20 mg Oral  Daily  . sodium chloride  3 mL Intravenous Q12H   PRN Meds albuterol, fentaNYL, ondansetron **OR** ondansetron (ZOFRAN) IV, prochlorperazine, temazepam Results for orders placed or performed during the hospital encounter of 03/03/15 (from the past 48 hour(s))  Comprehensive metabolic panel     Status: Abnormal   Collection Time: 03/03/15 11:05 AM  Result Value Ref Range   Sodium 134 (L) 135 - 145 mmol/L   Potassium 4.5 3.5 - 5.1 mmol/L   Chloride 99 96 - 112 mmol/L   CO2 24 19 - 32 mmol/L   Glucose, Bld 97 70 - 99 mg/dL   BUN 45 (H) 6 - 23 mg/dL   Creatinine, Ser 1.97 (H) 0.50 - 1.35 mg/dL   Calcium 7.9 (L) 8.4 - 10.5  mg/dL   Total Protein 6.1 6.0 - 8.3 g/dL   Albumin 3.0 (L) 3.5 - 5.2 g/dL   AST 173 (H) 0 - 37 U/L   ALT 282 (H) 0 - 53 U/L   Alkaline Phosphatase 400 (H) 39 - 117 U/L   Total Bilirubin 2.6 (H) 0.3 - 1.2 mg/dL   GFR calc non Af Amer 32 (L) >90 mL/min   GFR calc Af Amer 38 (L) >90 mL/min    Comment: (NOTE) The eGFR has been calculated using the CKD EPI equation. This calculation has not been validated in all clinical situations. eGFR's persistently <90 mL/min signify possible Chronic Kidney Disease.    Anion gap 11 5 - 15  CBC with Differential/Platelet     Status: Abnormal   Collection Time: 03/03/15 11:05 AM  Result Value Ref Range   WBC 10.0 4.0 - 10.5 K/uL   RBC 3.13 (L) 4.22 - 5.81 MIL/uL   Hemoglobin 10.5 (L) 13.0 - 17.0 g/dL   HCT 31.9 (L) 39.0 - 52.0 %   MCV 101.9 (H) 78.0 - 100.0 fL   MCH 33.5 26.0 - 34.0 pg   MCHC 32.9 30.0 - 36.0 g/dL   RDW 23.1 (H) 11.5 - 15.5 %   Platelets 67 (L) 150 - 400 K/uL    Comment: PLATELET COUNT CONFIRMED BY SMEAR REPEATED TO VERIFY    Neutrophils Relative % 74 43 - 77 %   Lymphocytes Relative 12 12 - 46 %   Monocytes Relative 14 (H) 3 - 12 %   Eosinophils Relative 0 0 - 5 %   Basophils Relative 0 0 - 1 %   Neutro Abs 7.4 1.7 - 7.7 K/uL   Lymphs Abs 1.2 0.7 - 4.0 K/uL   Monocytes Absolute 1.4 (H) 0.1 - 1.0 K/uL    Eosinophils Absolute 0.0 0.0 - 0.7 K/uL   Basophils Absolute 0.0 0.0 - 0.1 K/uL   RBC Morphology POLYCHROMASIA PRESENT   I-stat chem 8, ed     Status: Abnormal   Collection Time: 03/03/15 11:11 AM  Result Value Ref Range   Sodium 133 (L) 135 - 145 mmol/L   Potassium 4.4 3.5 - 5.1 mmol/L   Chloride 98 96 - 112 mmol/L   BUN 45 (H) 6 - 23 mg/dL   Creatinine, Ser 1.80 (H) 0.50 - 1.35 mg/dL   Glucose, Bld 95 70 - 99 mg/dL   Calcium, Ion 0.92 (L) 1.13 - 1.30 mmol/L   TCO2 20 0 - 100 mmol/L   Hemoglobin 11.9 (L) 13.0 - 17.0 g/dL   HCT 35.0 (L) 39.0 - 52.0 %  Urinalysis, Routine w reflex microscopic     Status: Abnormal   Collection Time: 03/03/15  3:55 PM  Result Value Ref Range   Color, Urine AMBER (A) YELLOW    Comment: BIOCHEMICALS MAY BE AFFECTED BY COLOR   APPearance CLEAR CLEAR   Specific Gravity, Urine 1.015 1.005 - 1.030   pH 5.0 5.0 - 8.0   Glucose, UA NEGATIVE NEGATIVE mg/dL   Hgb urine dipstick NEGATIVE NEGATIVE   Bilirubin Urine SMALL (A) NEGATIVE   Ketones, ur NEGATIVE NEGATIVE mg/dL   Protein, ur NEGATIVE NEGATIVE mg/dL   Urobilinogen, UA 1.0 0.0 - 1.0 mg/dL   Nitrite NEGATIVE NEGATIVE   Leukocytes, UA NEGATIVE NEGATIVE    Comment: MICROSCOPIC NOT DONE ON URINES WITH NEGATIVE PROTEIN, BLOOD, LEUKOCYTES, NITRITE, OR GLUCOSE <1000 mg/dL.    Ct Abdomen Pelvis Wo Contrast  03/03/2015   CLINICAL DATA:  Right  lower quadrant abdominal pain . 100 therapy for right lung cancer. Renal insufficiency. No IV contrast.  EXAM: CT ABDOMEN AND PELVIS WITHOUT CONTRAST  TECHNIQUE: Multidetector CT imaging of the abdomen and pelvis was performed following the standard protocol without IV contrast.  COMPARISON:  CT 10/15/14  FINDINGS: Lower chest: Postsurgical change the right hemi thorax volume loss. Consolidation in the medial right lower lobe adjacent to surgical clips consistent with posttherapy collapse / consolidation. No change from prior. There is a loculated fluid collection in the  inferior posterior right hemi thorax measuring 5.4 by 3.8 cm compared to 5.8 x 3.7 cm. There is a mild reticular pattern in the peripheral left lower lobe. No nodularity  Hepatobiliary: Several hypodense lesions within liver which cannot further characterized on this noncontrast exam. There were previous hypermetabolic liver metastasis on PET-CT scan of 09/30/2014. In comparison to this region on noncontrast CT several lesions are smaller.  Pancreas: Pancreas is normal. No ductal dilatation. No pancreatic inflammation.  Spleen: Normal normal spleen.  Adrenals/urinary tract: Normal adrenal glands are normal. The kidneys ureters bladder.  Stomach/Bowel: Stomach, small bowel, and colon are normal.  Vascular/Lymphatic: Abdominal aorta is normal caliber with atherosclerotic calcification. There is no retroperitoneal or periportal lymphadenopathy. No pelvic lymphadenopathy.  Reproductive: Prostate gland is normal. Small inguinal hernias noted.  Musculoskeletal: No aggressive osseous lesion. Thoracotomy defect in the posterior right hemi thorax ribs. Sclerotic change in the right humeral head is felt to be degenerative.  IMPRESSION: 1. No acute findings abdomen pelvis. 2. Hepatic metastasis difficult to assess on this noncontrast exam. 3. No nephrolithiasis or ureterolithiasis. 4. Normal appendix. 5. Postsurgical change in the right hemi thorax with consolidation and loculated fluid collection. These findings are not significantly changed 10/15/2014. 6. Stable sclerotic change in the right femoral head. Cannot exclude avascular necrosis.   Electronically Signed   By: Suzy Bouchard M.D.   On: 03/03/2015 13:20   US Abdomen Complete  03/03/2015   CLINICAL DATA:  74 year old male with abdominal pain. Initial encounter. History of CT-guided liver biopsy in November 2015 reportedly revealing metastatic disease. Lung cancer recurrence.  EXAM: ULTRASOUND ABDOMEN COMPLETE  COMPARISON:  Noncontrast CT Abdomen and Pelvis 1210  hr today reported separately.  FINDINGS: Gallbladder: Contracted. Up to mild gallbladder wall thickening at 3 mm. No sonographic Murphy sign elicited. No echogenic sludge or stones identified.  Common bile duct: Diameter: 5 mm, normal  Liver: Miliary type hepatic metastases are poorly visible with sonographic technique. These were more evident on the noncontrast study earlier today. There is a mildly lobulated hepatic contour. No intrahepatic biliary ductal dilatation identified.  IVC: No abnormality visualized.  Pancreas: Incompletely visualized due to overlying bowel gas, visualized portions within normal limits.  Spleen: Size and appearance within normal limits.  Right Kidney: Length: 10.3 cm No hydronephrosis or renal mass. Increased cortical echogenicity.  Left Kidney: Length: 10.3 cm. No hydronephrosis or left renal mass. Normal to increased cortical echogenicity.  Abdominal aorta: No aneurysm visualized.  Other findings: None.  IMPRESSION: 1. Contracted gallbladder with mild gallbladder wall thickening, but no cholelithiasis or sonographic Murphy sign elicited to strongly suggest acute cholecystitis. 2. Liver metastatic disease poorly visible by ultrasound. 3. Suggestion of chronic medical renal disease.   Electronically Signed   By: Genevie Ann M.D.   On: 03/03/2015 15:56   Dg Chest Port 1 View  03/03/2015   CLINICAL DATA:  Right-sided pain  EXAM: PORTABLE CHEST - 1 VIEW  COMPARISON:  02/06/2015  FINDINGS: Cardiac  shadow is stable. The left lung remains well aerated. Volume loss on the right with mediastinal shift to the right is noted. Old rib fractures are again seen on the right. A right-sided pleural effusion is again noted and stable.  IMPRESSION: No change from the prior exam.  No acute abnormality noted.   Electronically Signed   By: Inez Catalina M.D.   On: 03/03/2015 11:58               Blood pressure 148/82, pulse 104, temperature 98 F (36.7 C), temperature source Oral, resp. rate 18,  height _0  (1.676 m), weight 81.194 kg (179 lb), SpO2 98 %.  Physical exam:   General--Pleasant white male in no acute distress who is tolerating clear liquid diet  cardiac - regular rate and rhythm without murmurs are gallops  neck--no lymphadenopathy  Lungs--clear  Abdomen--nondistended with good bowel sounds with mild if any right upper quadrant tenderness  Psych- alert and oriented and completely appropriate  Assessment: 1. Right upper quadrant pain/abnormal LFTs. CT an ultrasound do not suggest biliary dilatation. He does have diffuse metastatic disease and the liver and this could be reaction of the metastasis to the chemotherapy. He is clinically somewhat better today. The gallbladder does not reveal any stones. 2. Diarrhea. Probably due to chemotherapy stool for C. difficile pending 3. Stage III non-small cell lung cancer with known metastasis to the liver status post VATS surgery currently receiving chemotherapy from Dr. Earlie Server  Plan: 1. We'll go ahead and follow his liver test. 2. Do not feel that we need to do further testing of his gallbladder or biliary system at this time.   Avagrace Botelho JR,Magdelene Ruark L 03/03/2015, 6:45 PM

## 2015-03-03 NOTE — ED Notes (Signed)
The pt returned from [place].no pain

## 2015-03-03 NOTE — ED Notes (Signed)
Called [facility] to cancel code stemi

## 2015-03-04 ENCOUNTER — Ambulatory Visit: Payer: 59

## 2015-03-04 ENCOUNTER — Encounter: Payer: 59 | Admitting: Nutrition

## 2015-03-04 ENCOUNTER — Other Ambulatory Visit: Payer: 59

## 2015-03-04 LAB — COMPREHENSIVE METABOLIC PANEL
ALT: 282 U/L — ABNORMAL HIGH (ref 0–53)
ANION GAP: 11 (ref 5–15)
AST: 192 U/L — ABNORMAL HIGH (ref 0–37)
Albumin: 2.8 g/dL — ABNORMAL LOW (ref 3.5–5.2)
Alkaline Phosphatase: 427 U/L — ABNORMAL HIGH (ref 39–117)
BUN: 41 mg/dL — ABNORMAL HIGH (ref 6–23)
CO2: 22 mmol/L (ref 19–32)
Calcium: 7.7 mg/dL — ABNORMAL LOW (ref 8.4–10.5)
Chloride: 104 mmol/L (ref 96–112)
Creatinine, Ser: 1.71 mg/dL — ABNORMAL HIGH (ref 0.50–1.35)
GFR calc Af Amer: 45 mL/min — ABNORMAL LOW (ref >90)
GFR calc non Af Amer: 38 mL/min — ABNORMAL LOW (ref >90)
GLUCOSE: 81 mg/dL (ref 70–99)
POTASSIUM: 4.4 mmol/L (ref 3.5–5.1)
Sodium: 137 mmol/L (ref 135–145)
Total Bilirubin: 3.1 mg/dL — ABNORMAL HIGH (ref 0.3–1.2)
Total Protein: 5.8 g/dL — ABNORMAL LOW (ref 6.0–8.3)

## 2015-03-04 LAB — CBC
HCT: 30.7 % — ABNORMAL LOW (ref 39.0–52.0)
HEMOGLOBIN: 10.4 g/dL — AB (ref 13.0–17.0)
MCH: 34.2 pg — AB (ref 26.0–34.0)
MCHC: 33.9 g/dL (ref 30.0–36.0)
MCV: 101 fL — AB (ref 78.0–100.0)
Platelets: 125 10*3/uL — ABNORMAL LOW (ref 150–400)
RBC: 3.04 MIL/uL — ABNORMAL LOW (ref 4.22–5.81)
RDW: 23 % — ABNORMAL HIGH (ref 11.5–15.5)
WBC: 9.1 10*3/uL (ref 4.0–10.5)

## 2015-03-04 LAB — HEPATITIS PANEL, ACUTE
HCV AB: NEGATIVE
HEP A IGM: NONREACTIVE
Hep B C IgM: NONREACTIVE
Hepatitis B Surface Ag: NEGATIVE

## 2015-03-04 LAB — CLOSTRIDIUM DIFFICILE BY PCR: CDIFFPCR: NEGATIVE

## 2015-03-04 MED ORDER — SODIUM CHLORIDE 0.9 % IV BOLUS (SEPSIS)
500.0000 mL | Freq: Once | INTRAVENOUS | Status: AC
  Administered 2015-03-04: 500 mL via INTRAVENOUS

## 2015-03-04 MED ORDER — OMEPRAZOLE 20 MG PO CPDR
40.0000 mg | DELAYED_RELEASE_CAPSULE | Freq: Every day | ORAL | Status: DC
  Administered 2015-03-04: 40 mg via ORAL
  Filled 2015-03-04: qty 2

## 2015-03-04 MED ORDER — LISINOPRIL 5 MG PO TABS: 5.0000 mg | ORAL_TABLET | Freq: Every day | ORAL | Status: AC

## 2015-03-04 MED ORDER — METOPROLOL TARTRATE 25 MG PO TABS
25.0000 mg | ORAL_TABLET | Freq: Two times a day (BID) | ORAL | Status: DC
  Filled 2015-03-04 (×2): qty 1

## 2015-03-04 MED ORDER — METOPROLOL TARTRATE 25 MG PO TABS: 25.0000 mg | ORAL_TABLET | Freq: Two times a day (BID) | ORAL | Status: AC

## 2015-03-04 MED ORDER — FUROSEMIDE 40 MG PO TABS: 40.0000 mg | ORAL_TABLET | Freq: Every day | ORAL | Status: AC

## 2015-03-04 MED ORDER — METOPROLOL TARTRATE 1 MG/ML IV SOLN
5.0000 mg | Freq: Once | INTRAVENOUS | Status: AC
  Administered 2015-03-04: 5 mg via INTRAVENOUS
  Filled 2015-03-04: qty 5

## 2015-03-04 MED ORDER — NON FORMULARY: 40.0000 mg | Freq: Every day | Status: DC

## 2015-03-04 NOTE — Discharge Instructions (Signed)
Follow with Primary MD [name],[name], MD in 7 days   Get CBC, CMP, 2 view Chest X ray checked  by Primary MD next visit.    Activity: As tolerated with Full fall precautions use walker/cane & assistance as needed   Disposition Home     Diet: Heart Healthy    For Heart failure patients - Check your Weight same time everyday, if you gain over 2 pounds, or you develop in leg swelling, experience more shortness of breath or chest pain, call your Primary MD immediately. Follow Cardiac Low Salt Diet and 1.5 lit/day fluid restriction.   On your next visit with your primary care physician please Get Medicines reviewed and adjusted.   Please request your Prim.MD to go over all Hospital Tests and Procedure/Radiological results at the follow up, please get all Hospital records sent to your Prim MD by signing hospital release before you go home.   If you experience worsening of your admission symptoms, develop shortness of breath, life threatening emergency, suicidal or homicidal thoughts you must seek medical attention immediately by calling 911 or calling your MD immediately  if symptoms less severe.  You Must read complete instructions/literature along with all the possible adverse reactions/side effects for all the Medicines you take and that have been prescribed to you. Take any new Medicines after you have completely understood and accpet all the possible adverse reactions/side effects.   Do not drive, operating heavy machinery, perform activities at heights, swimming or participation in water activities or provide baby sitting services if your were admitted for syncope or siezures until you have seen by Primary MD or a Neurologist and advised to do so again.  Do not drive when taking Pain medications.    Do not take more than prescribed Pain, Sleep and Anxiety Medications  Special Instructions: If you have smoked or chewed Tobacco  in the last 2 yrs please stop smoking, stop any  regular Alcohol  and or any Recreational drug use.  Wear Seat belts while driving.   Please note  You were cared for by a hospitalist during your hospital stay. If you have any questions about your discharge medications or the care you received while you were in the hospital after you are discharged, you can call the unit and asked to speak with the hospitalist on call if the hospitalist that took care of you is not available. Once you are discharged, your primary care physician will handle any further medical issues. Please note that NO REFILLS for any discharge medications will be authorized once you are discharged, as it is imperative that you return to your primary care physician (or establish a relationship with a primary care physician if you do not have one) for your aftercare needs so that they can reassess your need for medications and monitor your lab values.

## 2015-03-04 NOTE — Discharge Summary (Addendum)
Todd Villanueva, is a 74 y.o. male  DOB [birth year]  MRN [id number].  Admission date:  03/03/2015  Admitting Physician  Louellen Molder, MD  Discharge Date:  03/04/2015   Primary MD  [name], MD  Recommendations for primary care physician for things to follow:   Monitor CMP closely. 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 Hypertension   . lung ca dx'd 02/2011    xrt/chemo comp 03/30/11, stage IIIA  . Hyperlipidemia   . 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 History of angina   . CAD (coronary artery disease)   . Systolic CHF     Past Surgical History  Procedure Laterality Date  . Right vats, rt thoracotomy, rt middle and rt lower  lobectomy  06/10/2011    Dr Arlyce Dice  . Knee arthroscopy    . Tonsillectomy    . Coronary stent placement  JULY,AUGUST 2011  . Orif femoral neck fracture w/ dhs    . 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 Julien Nordmann, COPD with emphysema, CAD with history of systolic CHF, hypertension who presented to the ED with acute onset of right upper quadrant pain since last night. Patient reports sharp right upper quadrant pain radiating across the abdomen and to the back without any aggravating or relieving factors. Reports some nausea but no vomiting. He also had 6-7 episodes of diarrhea since yesterday. Also reports noticing dark urine since past 2 days. Denies fever, chills, headache, chest pain, palpitations, shortness of breath.  Denies change in weight or appetite. His last chemotherapy was 3 weeks back and is due tomorrow.   Hospital Course   1. Acute abdominal pain with transaminitis. Pain  has completely resolved since yesterday afternoon, tolerating diet, CT scan abdomen and pelvis and right upper quadrant ultrasound both are nonacute. He was seen by GI and no further testing was advised. His transaminitis most likely is due to combination of hepatic metastases and IMA therapy. Discussed his case with his oncologist Dr. Julien Nordmann. Patient is symptom-free will be discharged. We'll follow with PCP, GI and his oncologist closely for liver function monitoring. I have stopped his statin for now.   2. Right lower lobe lung cancer with liver metastases. Undergoing chemotherapy with Dr. Julien Nordmann. Case discussed with him. He will follow with him post discharge in 1-2 days. He status post right middle and lower lobectomy in 2012 by [name].   3. Acute renal failure on chronic kidney disease stage IV. Baseline creatinine is around 1.6. This was due  to dehydration is almost back to baseline after hydration. We will hold his Lasix for 2 more days and cut back his lisinopril into half. Request PCP to continue monitoring renal function closely    4. COPD with emphysema. No acute issues continue home regimen.    5. Chronic ischemic cardiomyopathy with chronic systolic heart failure EF 35-40% by echogram in 2015. Was dehydrated, hydrated with gentle IV fluids, due to acute renal failure ACE inhibitor dose cut in half and Lasix kept for another day. Place on low-dose beta blocker.   6. Junctional tachycardia. Reviewing the chart looks like patient's baseline heart rate is around 1:15 to 120. EKG showed junctional tachycardia, he was hydrated, placed on low-dose beta blocker, he was symptom free. He is following for this problem with Dr. Vanice Sarah. Requested to follow with him in a week.    Discharge Condition: Stable   Follow UP  Follow-up Information    Follow up with [name],[name], MD. Schedule an appointment as soon as possible for a visit on 03/20/2015.   Specialty:  Internal Medicine   Why:  Appointment with Dr. [name] is on 04/03/2015 at 3:30pm   Contact information:   [address] [place] [address] [place] [address] [phone]       Follow up with [name], MD. Schedule an appointment as soon as possible for a visit in 1 week.   Specialty:  Oncology   Why:  chemotherapy, monitor liver function/ left message office will call back    Contact information:   [place] [address] [phone]       Follow up with [name], MD. Schedule an appointment as soon as possible for a visit on 03/18/2015.   Specialty:  Gastroenterology   Why:  Monitor liver function/  Appointment with Dr. Oletta Lamas is on 03/18/15 at 3:30   Contact information:   [address] [place] [address] [phone]         Discharge Instructions  and  Discharge Medications            Discharge Instructions    Diet - low sodium heart healthy    Complete by:  As directed      Discharge instructions    Complete by:  As directed   Follow with Primary MD [name],[name], MD in 7 days   Get CBC, CMP, 2 view Chest X ray checked  by Primary MD next visit.    Activity: As tolerated with Full fall precautions use walker/cane & assistance as needed   Disposition Home     Diet: Heart Healthy  For Heart failure patients - Check your Weight same time everyday, if you gain over 2 pounds, or you develop in leg swelling, experience more shortness of breath or chest pain, call your Primary MD immediately. Follow Cardiac Low Salt Diet and 1.5 lit/day fluid restriction.   On your next visit with your primary care physician please Get Medicines reviewed and adjusted.   Please request your Prim.MD to go over all Hospital Tests and Procedure/Radiological results at the follow up, please get all Hospital records sent to your Prim MD by signing hospital release before you go home.   If you experience worsening of your admission symptoms, develop shortness of breath, life threatening emergency, suicidal or homicidal thoughts you must seek medical attention immediately by calling 911 or calling your MD immediately  if symptoms less severe.  You Must read complete instructions/literature along with all the possible adverse reactions/side effects for all the Medicines you take and that have been prescribed to you. Take any new Medicines after you have completely understood and accpet all the possible adverse reactions/side effects.   Do not drive, operating heavy machinery, perform activities at heights, swimming or participation in water activities or provide baby sitting services if your were admitted for syncope or siezures until you have seen by Primary MD or a Neurologist and advised to do so again.  Do not drive when taking Pain medications.    Do not take more than  prescribed Pain, Sleep and Anxiety Medications  Special Instructions: If you have smoked or chewed Tobacco  in the last 2 yrs please stop smoking, stop any regular Alcohol  and or any Recreational drug use.  Wear Seat belts while driving.   Please note  You were cared for by a hospitalist during your hospital stay. If you have any questions about your discharge medications or the care you received while you were in the hospital after you are discharged, you can call the unit and asked to speak with the hospitalist on call if the hospitalist that took care of you is not available. Once you are discharged, your primary care physician will handle any further medical issues. Please note that NO REFILLS for any discharge medications will be authorized once you are discharged, as it is imperative that you return to your primary care physician (or establish a relationship with a primary care physician if you do not have one) for your aftercare needs so that they can reassess your need for medications and monitor your lab values.     Increase activity slowly    Complete by:  As directed             Medication List    STOP taking these medications        simvastatin 40 MG tablet  Commonly known as:  ZOCOR      TAKE these medications        Albuterol Sulfate 108 (90 BASE) MCG/ACT Aepb  Commonly known as:  PROAIR RESPICLICK  Inhale 2 puffs into the lungs every 6 (six) hours as needed.     bevacizumab 1.25 mg/0.1 mL Soln  Commonly known as:  AVASTIN  1.25 mg by Intravitreal route every 30 (thirty) days.     budesonide-formoterol 160-4.5 MCG/ACT inhaler  Commonly known as:  SYMBICORT  Inhale 2 puffs into the lungs 2 (two) times daily.     calcium-vitamin D 500-200 MG-UNIT per tablet  Take 1 tablet by mouth daily.     citalopram 20 MG tablet  Commonly known as:  CELEXA  Take 20 mg by mouth daily.     furosemide 40 MG tablet  Commonly known as:  LASIX  Take 1 tablet (40 mg total) by  mouth daily.  Start taking on:  03/06/2015     HYDROmorphone 2 MG tablet  Commonly known as:  DILAUDID  Take 1 tablet (2 mg total) by mouth every 6 (six) hours as needed for severe pain.     lisinopril 5 MG tablet  Commonly known as:  PRINIVIL,ZESTRIL  Take 1 tablet (5 mg total) by mouth daily.  Start taking on:  03/06/2015     magic mouthwash w/lidocaine Soln  Take 5 mLs by mouth 4 (four) times daily as needed for mouth pain.     metoprolol tartrate 25 MG tablet  Commonly known as:  LOPRESSOR  Take 1 tablet (25 mg total) by mouth 2 (two) times daily.     MUCINEX PO  Take 600 mg by mouth 2 (two) times daily.     omeprazole 20 MG capsule  Commonly known as:  PRILOSEC  Take 40 mg by mouth daily.     predniSONE 10 MG tablet  Commonly known as:  DELTASONE  Take 20 mg by mouth daily.     prochlorperazine 10 MG tablet  Commonly known as:  COMPAZINE  Take 1 tablet (10 mg total) by mouth every 6 (six) hours as needed for nausea or vomiting.     temazepam 30 MG capsule  Commonly known as:  RESTORIL  Take 30 mg by mouth at bedtime as needed for sleep.     XOPENEX 0.63 MG/3ML nebulizer solution  Generic drug:  levalbuterol  Take 0.63 mg by nebulization 2 (two) times daily.          Diet and Activity recommendation: See Discharge Instructions above   Consults obtained - GI   Major procedures and Radiology Reports - PLEASE review detailed and final reports for all details, in brief -       Ct Abdomen Pelvis Wo Contrast  03/03/2015   CLINICAL DATA:  Right lower quadrant abdominal pain . 100 therapy for right lung cancer. Renal insufficiency. No IV contrast.  EXAM: CT ABDOMEN AND PELVIS WITHOUT CONTRAST  TECHNIQUE: Multidetector CT imaging of the abdomen and pelvis was performed following the standard protocol without IV contrast.  COMPARISON:  CT 10/15/14  FINDINGS: Lower chest: Postsurgical change the right hemi thorax volume loss. Consolidation in the medial right lower  lobe adjacent to surgical clips consistent with posttherapy collapse / consolidation. No change from prior. There is a loculated fluid collection in the inferior posterior right hemi thorax measuring 5.4 by 3.8 cm compared to 5.8 x 3.7 cm. There is a mild reticular pattern in the peripheral left lower lobe. No nodularity  Hepatobiliary: Several hypodense lesions within liver which cannot further characterized on this noncontrast exam. There were previous hypermetabolic liver metastasis on PET-CT scan of 09/30/2014. In comparison to this region on noncontrast CT several lesions are smaller.  Pancreas: Pancreas is normal. No ductal dilatation. No pancreatic inflammation.  Spleen: Normal normal spleen.  Adrenals/urinary tract: Normal adrenal glands are normal. The kidneys ureters bladder.  Stomach/Bowel: Stomach, small bowel, and colon are normal.  Vascular/Lymphatic: Abdominal aorta is normal caliber with atherosclerotic calcification. There is no retroperitoneal or periportal lymphadenopathy. No pelvic lymphadenopathy.  Reproductive: Prostate gland is normal. Small inguinal hernias noted.  Musculoskeletal: No aggressive osseous lesion. Thoracotomy defect in the posterior right hemi thorax ribs. Sclerotic  change in the right humeral head is felt to be degenerative.  IMPRESSION: 1. No acute findings abdomen pelvis. 2. Hepatic metastasis difficult to assess on this noncontrast exam. 3. No nephrolithiasis or ureterolithiasis. 4. Normal appendix. 5. Postsurgical change in the right hemi thorax with consolidation and loculated fluid collection. These findings are not significantly changed 10/15/2014. 6. Stable sclerotic change in the right femoral head. Cannot exclude avascular necrosis.   Electronically Signed   By: Suzy Bouchard M.D.   On: 03/03/2015 13:20   US Abdomen Complete  03/03/2015   CLINICAL DATA:  74 year old male with abdominal pain. Initial encounter. History of CT-guided liver biopsy in November 2015  reportedly revealing metastatic disease. Lung cancer recurrence.  EXAM: ULTRASOUND ABDOMEN COMPLETE  COMPARISON:  Noncontrast CT Abdomen and Pelvis 1210 hr today reported separately.  FINDINGS: Gallbladder: Contracted. Up to mild gallbladder wall thickening at 3 mm. No sonographic Murphy sign elicited. No echogenic sludge or stones identified.  Common bile duct: Diameter: 5 mm, normal  Liver: Miliary type hepatic metastases are poorly visible with sonographic technique. These were more evident on the noncontrast study earlier today. There is a mildly lobulated hepatic contour. No intrahepatic biliary ductal dilatation identified.  IVC: No abnormality visualized.  Pancreas: Incompletely visualized due to overlying bowel gas, visualized portions within normal limits.  Spleen: Size and appearance within normal limits.  Right Kidney: Length: 10.3 cm No hydronephrosis or renal mass. Increased cortical echogenicity.  Left Kidney: Length: 10.3 cm. No hydronephrosis or left renal mass. Normal to increased cortical echogenicity.  Abdominal aorta: No aneurysm visualized.  Other findings: None.  IMPRESSION: 1. Contracted gallbladder with mild gallbladder wall thickening, but no cholelithiasis or sonographic Murphy sign elicited to strongly suggest acute cholecystitis. 2. Liver metastatic disease poorly visible by ultrasound. 3. Suggestion of chronic medical renal disease.   Electronically Signed   By: Genevie Ann M.D.   On: 03/03/2015 15:56   Dg Chest Port 1 View  03/03/2015   CLINICAL DATA:  Right-sided pain  EXAM: PORTABLE CHEST - 1 VIEW  COMPARISON:  02/06/2015  FINDINGS: Cardiac shadow is stable. The left lung remains well aerated. Volume loss on the right with mediastinal shift to the right is noted. Old rib fractures are again seen on the right. A right-sided pleural effusion is again noted and stable.  IMPRESSION: No change from the prior exam.  No acute abnormality noted.   Electronically Signed   By: Inez Catalina M.D.    On: 03/03/2015 11:58   Dg Chest Port 1 View  02/06/2015   CLINICAL DATA:  Difficulty breathing and weakness  EXAM: PORTABLE CHEST - 1 VIEW  COMPARISON:  Chest radiograph February 01, 2014 and chest CT December 22, 2014  FINDINGS: There is chronic volume loss in the right middle and lower lung regions, stable. Left lung is clear. Heart size is within normal limits. No adenopathy.  IMPRESSION: Persistent consolidation and volume loss in the right mid and lower lung zones, stable. No new opacity. No change in cardiac silhouette.   Electronically Signed   By: Lowella Grip III M.D.   On: 02/06/2015 14:43    Micro Results      Recent Results (from the past 240 hour(s))  TECHNOLOGIST REVIEW     Status: None   Collection Time: 02/25/15 10:42 AM  Result Value Ref Range Status   Technologist Review Metas and Myelocytes present  Final  Clostridium Difficile by PCR     Status: None   Collection  Time: 03/03/15  6:41 PM  Result Value Ref Range Status   C difficile by pcr NEGATIVE NEGATIVE Final       Today   Subjective:   Symir Mah today has no headache,no chest abdominal pain,no new weakness tingling or numbness, feels much better wants to go home today.   Objective:   Blood pressure 126/73, pulse 82, temperature 97.9 F (36.6 C), temperature source Oral, resp. rate 15, height 5\' 6"  (1.676 m), weight 81.194 kg (179 lb), SpO2 98 %.   Intake/Output Summary (Last 24 hours) at 03/04/15 1044 Last data filed at 03/04/15 1043  Gross per 24 hour  Intake 1792.5 ml  Output    550 ml  Net 1242.5 ml    Exam Awake Alert, Oriented x 3, No new F.N deficits, Normal affect [place].AT,PERRAL Supple Neck,No JVD, No cervical lymphadenopathy appriciated.  Symmetrical Chest wall movement, Good air movement bilaterally, CTAB RRR,No Gallops,Rubs or new Murmurs, No Parasternal Heave +ve B.Sounds, Abd Soft, Non tender, No organomegaly appriciated, No rebound -guarding or rigidity. No Cyanosis,  Clubbing or edema, No new Rash or bruise  Data Review   CBC w Diff:  Lab Results  Component Value Date   WBC 9.1 03/04/2015   WBC 12.5* 02/25/2015   HGB 10.4* 03/04/2015   HGB 10.7* 02/25/2015   HCT 30.7* 03/04/2015   HCT 32.4* 02/25/2015   PLT 125* 03/04/2015   PLT 83* 02/25/2015   LYMPHOPCT 12 03/03/2015   LYMPHOPCT 6.8* 02/25/2015   BANDSPCT 0 02/07/2015   MONOPCT 14* 03/03/2015   MONOPCT 5.1 02/25/2015   EOSPCT 0 03/03/2015   EOSPCT 0.2 02/25/2015   BASOPCT 0 03/03/2015   BASOPCT 0.2 02/25/2015    CMP:  Lab Results  Component Value Date   NA 137 03/04/2015   NA 140 02/25/2015   NA 142 01/10/2012   K 4.4 03/04/2015   K 4.4 02/25/2015   K 3.9 01/10/2012   CL 104 03/04/2015   CL 107 10/15/2012   CL 102 01/10/2012   CO2 22 03/04/2015   CO2 18* 02/25/2015   CO2 26 01/10/2012   BUN 41* 03/04/2015   BUN 42.8* 02/25/2015   BUN 27* 01/10/2012   CREATININE 1.71* 03/04/2015   CREATININE 1.6* 02/25/2015   CREATININE 1.1 01/10/2012   PROT 5.8* 03/04/2015   PROT 6.9 02/25/2015   PROT 7.4 01/10/2012   ALBUMIN 2.8* 03/04/2015   ALBUMIN 3.4* 02/25/2015   BILITOT 3.1* 03/04/2015   BILITOT 0.51 02/25/2015   BILITOT 0.60 01/10/2012   ALKPHOS 427* 03/04/2015   ALKPHOS 204* 02/25/2015   ALKPHOS 271* 01/10/2012   AST 192* 03/04/2015   AST 76* 02/25/2015   AST 28 01/10/2012   ALT 282* 03/04/2015   ALT 109* 02/25/2015   ALT 28 01/10/2012  .   Total Time in preparing paper work, data evaluation and todays exam - 35 minutes  Thurnell Lose M.D on 03/04/2015 at 10:44 AM  [facility]   Office  [phone]

## 2015-03-04 NOTE — Progress Notes (Signed)
[facility] Progress Note  Subjective: Patient states he feels great today had more formed stools, no abdominal pain  Objective: Vital signs in last 24 hours: Temp:  [97.7 F (36.5 C)-98.8 F (37.1 C)] 97.9 F (36.6 C) (03/30 0543) Pulse Rate:  [104-137] 107 (03/30 0543) Resp:  [15-25] 15 (03/30 0543) BP: (99-148)/(67-83) 126/73 mmHg (03/30 0543) SpO2:  [95 %-98 %] 98 % (03/30 0543) Weight:  [81.194 kg (179 lb)] 81.194 kg (179 lb) (03/29 1051) Weight change:    PE: Abdomen soft slightly distended with normoactive bowel sounds.  Lab Results: Results for orders placed or performed during the hospital encounter of 03/03/15 (from the past 24 hour(s))  Comprehensive metabolic panel     Status: Abnormal   Collection Time: 03/03/15 11:05 AM  Result Value Ref Range   Sodium 134 (L) 135 - 145 mmol/L   Potassium 4.5 3.5 - 5.1 mmol/L   Chloride 99 96 - 112 mmol/L   CO2 24 19 - 32 mmol/L   Glucose, Bld 97 70 - 99 mg/dL   BUN 45 (H) 6 - 23 mg/dL   Creatinine, Ser 1.97 (H) 0.50 - 1.35 mg/dL   Calcium 7.9 (L) 8.4 - 10.5 mg/dL   Total Protein 6.1 6.0 - 8.3 g/dL   Albumin 3.0 (L) 3.5 - 5.2 g/dL   AST 173 (H) 0 - 37 U/L   ALT 282 (H) 0 - 53 U/L   Alkaline Phosphatase 400 (H) 39 - 117 U/L   Total Bilirubin 2.6 (H) 0.3 - 1.2 mg/dL   GFR calc non Af Amer 32 (L) >90 mL/min   GFR calc Af Amer 38 (L) >90 mL/min   Anion gap 11 5 - 15  CBC with Differential/Platelet     Status: Abnormal   Collection Time: 03/03/15 11:05 AM  Result Value Ref Range   WBC 10.0 4.0 - 10.5 K/uL   RBC 3.13 (L) 4.22 - 5.81 MIL/uL   Hemoglobin 10.5 (L) 13.0 - 17.0 g/dL   HCT 31.9 (L) 39.0 - 52.0 %   MCV 101.9 (H) 78.0 - 100.0 fL   MCH 33.5 26.0 - 34.0 pg   MCHC 32.9 30.0 - 36.0 g/dL   RDW 23.1 (H) 11.5 - 15.5 %   Platelets 67 (L) 150 - 400 K/uL   Neutrophils Relative % 74 43 - 77 %   Lymphocytes Relative 12 12 - 46 %   Monocytes Relative 14 (H) 3 - 12 %   Eosinophils Relative 0 0 - 5 %   Basophils  Relative 0 0 - 1 %   Neutro Abs 7.4 1.7 - 7.7 K/uL   Lymphs Abs 1.2 0.7 - 4.0 K/uL   Monocytes Absolute 1.4 (H) 0.1 - 1.0 K/uL   Eosinophils Absolute 0.0 0.0 - 0.7 K/uL   Basophils Absolute 0.0 0.0 - 0.1 K/uL   RBC Morphology POLYCHROMASIA PRESENT   I-stat chem 8, ed     Status: Abnormal   Collection Time: 03/03/15 11:11 AM  Result Value Ref Range   Sodium 133 (L) 135 - 145 mmol/L   Potassium 4.4 3.5 - 5.1 mmol/L   Chloride 98 96 - 112 mmol/L   BUN 45 (H) 6 - 23 mg/dL   Creatinine, Ser 1.80 (H) 0.50 - 1.35 mg/dL   Glucose, Bld 95 70 - 99 mg/dL   Calcium, Ion 0.92 (L) 1.13 - 1.30 mmol/L   TCO2 20 0 - 100 mmol/L   Hemoglobin 11.9 (L) 13.0 - 17.0 g/dL   HCT  35.0 (L) 39.0 - 52.0 %  Urinalysis, Routine w reflex microscopic     Status: Abnormal   Collection Time: 03/03/15  3:55 PM  Result Value Ref Range   Color, Urine AMBER (A) YELLOW   APPearance CLEAR CLEAR   Specific Gravity, Urine 1.015 1.005 - 1.030   pH 5.0 5.0 - 8.0   Glucose, UA NEGATIVE NEGATIVE mg/dL   Hgb urine dipstick NEGATIVE NEGATIVE   Bilirubin Urine SMALL (A) NEGATIVE   Ketones, ur NEGATIVE NEGATIVE mg/dL   Protein, ur NEGATIVE NEGATIVE mg/dL   Urobilinogen, UA 1.0 0.0 - 1.0 mg/dL   Nitrite NEGATIVE NEGATIVE   Leukocytes, UA NEGATIVE NEGATIVE  Hepatitis panel, acute     Status: None   Collection Time: 03/03/15  6:59 PM  Result Value Ref Range   Hepatitis B Surface Ag NEGATIVE NEGATIVE   HCV Ab NEGATIVE NEGATIVE   Hep A IgM NON REACTIVE NON REACTIVE   Hep B C IgM NON REACTIVE NON REACTIVE  Comprehensive metabolic panel     Status: Abnormal   Collection Time: 03/04/15  5:21 AM  Result Value Ref Range   Sodium 137 135 - 145 mmol/L   Potassium 4.4 3.5 - 5.1 mmol/L   Chloride 104 96 - 112 mmol/L   CO2 22 19 - 32 mmol/L   Glucose, Bld 81 70 - 99 mg/dL   BUN 41 (H) 6 - 23 mg/dL   Creatinine, Ser 1.71 (H) 0.50 - 1.35 mg/dL   Calcium 7.7 (L) 8.4 - 10.5 mg/dL   Total Protein 5.8 (L) 6.0 - 8.3 g/dL   Albumin 2.8  (L) 3.5 - 5.2 g/dL   AST 192 (H) 0 - 37 U/L   ALT 282 (H) 0 - 53 U/L   Alkaline Phosphatase 427 (H) 39 - 117 U/L   Total Bilirubin 3.1 (H) 0.3 - 1.2 mg/dL   GFR calc non Af Amer 38 (L) >90 mL/min   GFR calc Af Amer 45 (L) >90 mL/min   Anion gap 11 5 - 15  CBC     Status: Abnormal   Collection Time: 03/04/15  5:21 AM  Result Value Ref Range   WBC 9.1 4.0 - 10.5 K/uL   RBC 3.04 (L) 4.22 - 5.81 MIL/uL   Hemoglobin 10.4 (L) 13.0 - 17.0 g/dL   HCT 30.7 (L) 39.0 - 52.0 %   MCV 101.0 (H) 78.0 - 100.0 fL   MCH 34.2 (H) 26.0 - 34.0 pg   MCHC 33.9 30.0 - 36.0 g/dL   RDW 23.0 (H) 11.5 - 15.5 %   Platelets 125 (L) 150 - 400 K/uL    Studies/Results: Ct Abdomen Pelvis Wo Contrast  03/03/2015   CLINICAL DATA:  Right lower quadrant abdominal pain . 100 therapy for right lung cancer. Renal insufficiency. No IV contrast.  EXAM: CT ABDOMEN AND PELVIS WITHOUT CONTRAST  TECHNIQUE: Multidetector CT imaging of the abdomen and pelvis was performed following the standard protocol without IV contrast.  COMPARISON:  CT 10/15/14  FINDINGS: Lower chest: Postsurgical change the right hemi thorax volume loss. Consolidation in the medial right lower lobe adjacent to surgical clips consistent with posttherapy collapse / consolidation. No change from prior. There is a loculated fluid collection in the inferior posterior right hemi thorax measuring 5.4 by 3.8 cm compared to 5.8 x 3.7 cm. There is a mild reticular pattern in the peripheral left lower lobe. No nodularity  Hepatobiliary: Several hypodense lesions within liver which cannot further characterized on this noncontrast exam.  There were previous hypermetabolic liver metastasis on PET-CT scan of 09/30/2014. In comparison to this region on noncontrast CT several lesions are smaller.  Pancreas: Pancreas is normal. No ductal dilatation. No pancreatic inflammation.  Spleen: Normal normal spleen.  Adrenals/urinary tract: Normal adrenal glands are normal. The kidneys ureters  bladder.  Stomach/Bowel: Stomach, small bowel, and colon are normal.  Vascular/Lymphatic: Abdominal aorta is normal caliber with atherosclerotic calcification. There is no retroperitoneal or periportal lymphadenopathy. No pelvic lymphadenopathy.  Reproductive: Prostate gland is normal. Small inguinal hernias noted.  Musculoskeletal: No aggressive osseous lesion. Thoracotomy defect in the posterior right hemi thorax ribs. Sclerotic change in the right humeral head is felt to be degenerative.  IMPRESSION: 1. No acute findings abdomen pelvis. 2. Hepatic metastasis difficult to assess on this noncontrast exam. 3. No nephrolithiasis or ureterolithiasis. 4. Normal appendix. 5. Postsurgical change in the right hemi thorax with consolidation and loculated fluid collection. These findings are not significantly changed 10/15/2014. 6. Stable sclerotic change in the right femoral head. Cannot exclude avascular necrosis.   Electronically Signed   By: Suzy Bouchard M.D.   On: 03/03/2015 13:20   US Abdomen Complete  03/03/2015   CLINICAL DATA:  74 year old male with abdominal pain. Initial encounter. History of CT-guided liver biopsy in November 2015 reportedly revealing metastatic disease. Lung cancer recurrence.  EXAM: ULTRASOUND ABDOMEN COMPLETE  COMPARISON:  Noncontrast CT Abdomen and Pelvis 1210 hr today reported separately.  FINDINGS: Gallbladder: Contracted. Up to mild gallbladder wall thickening at 3 mm. No sonographic Murphy sign elicited. No echogenic sludge or stones identified.  Common bile duct: Diameter: 5 mm, normal  Liver: Miliary type hepatic metastases are poorly visible with sonographic technique. These were more evident on the noncontrast study earlier today. There is a mildly lobulated hepatic contour. No intrahepatic biliary ductal dilatation identified.  IVC: No abnormality visualized.  Pancreas: Incompletely visualized due to overlying bowel gas, visualized portions within normal limits.  Spleen: Size  and appearance within normal limits.  Right Kidney: Length: 10.3 cm No hydronephrosis or renal mass. Increased cortical echogenicity.  Left Kidney: Length: 10.3 cm. No hydronephrosis or left renal mass. Normal to increased cortical echogenicity.  Abdominal aorta: No aneurysm visualized.  Other findings: None.  IMPRESSION: 1. Contracted gallbladder with mild gallbladder wall thickening, but no cholelithiasis or sonographic Murphy sign elicited to strongly suggest acute cholecystitis. 2. Liver metastatic disease poorly visible by ultrasound. 3. Suggestion of chronic medical renal disease.   Electronically Signed   By: Genevie Ann M.D.   On: 03/03/2015 15:56   Dg Chest Port 1 View  03/03/2015   CLINICAL DATA:  Right-sided pain  EXAM: PORTABLE CHEST - 1 VIEW  COMPARISON:  02/06/2015  FINDINGS: Cardiac shadow is stable. The left lung remains well aerated. Volume loss on the right with mediastinal shift to the right is noted. Old rib fractures are again seen on the right. A right-sided pleural effusion is again noted and stable.  IMPRESSION: No change from the prior exam.  No acute abnormality noted.   Electronically Signed   By: Inez Catalina M.D.   On: 03/03/2015 11:58      Assessment: Elevated liver enzymes likely secondary to hepatic metastases, no extrahepatic dilated ducts seen on ultrasound CT. Hepatitis panel negative   Plan: No biliary intervention currently needed. Diarrhea appears to be improving, C. difficile is pending. We'll sign off for now. Please call if further GI input needed.    QBHAL,PFXT C 03/04/2015, 8:08 AM

## 2015-03-04 NOTE — Progress Notes (Signed)
NURSING PROGRESS NOTE  Todd Villanueva [id number] Discharge Data: 03/04/2015 1:04 PM Attending Provider: Thurnell Lose, MD POE:UMPNT,IRWERX [name], MD     [name] to be D/C'd Home per MD order.  Discussed with the patient the After Visit Summary and all questions fully answered. All IV's discontinued with no bleeding noted. All belongings returned to patient for patient to take home.   Last Vital Signs:  Blood pressure 126/73, pulse 107, temperature 97.9 F (36.6 C), temperature source Oral, resp. rate 15, height 5\' 6"  (1.676 m), weight 81.194 kg (179 lb), SpO2 98 %.  Discharge Medication List   Medication List    STOP taking these medications        simvastatin 40 MG tablet  Commonly known as:  ZOCOR      TAKE these medications        Albuterol Sulfate 108 (90 BASE) MCG/ACT Aepb  Commonly known as:  PROAIR RESPICLICK  Inhale 2 puffs into the lungs every 6 (six) hours as needed.     bevacizumab 1.25 mg/0.1 mL Soln  Commonly known as:  AVASTIN  1.25 mg by Intravitreal route every 30 (thirty) days.     budesonide-formoterol 160-4.5 MCG/ACT inhaler  Commonly known as:  SYMBICORT  Inhale 2 puffs into the lungs 2 (two) times daily.     calcium-vitamin D 500-200 MG-UNIT per tablet  Take 1 tablet by mouth daily.     citalopram 20 MG tablet  Commonly known as:  CELEXA  Take 20 mg by mouth daily.     furosemide 40 MG tablet  Commonly known as:  LASIX  Take 1 tablet (40 mg total) by mouth daily.  Start taking on:  03/06/2015     HYDROmorphone 2 MG tablet  Commonly known as:  DILAUDID  Take 1 tablet (2 mg total) by mouth every 6 (six) hours as needed for severe pain.     lisinopril 5 MG tablet  Commonly known as:  PRINIVIL,ZESTRIL  Take 1 tablet (5 mg total) by mouth daily.  Start taking on:  03/06/2015     magic mouthwash w/lidocaine Soln  Take 5 mLs by mouth 4 (four) times daily as needed for mouth pain.     metoprolol tartrate 25 MG tablet  Commonly known  as:  LOPRESSOR  Take 1 tablet (25 mg total) by mouth 2 (two) times daily.     MUCINEX PO  Take 600 mg by mouth 2 (two) times daily.     omeprazole 20 MG capsule  Commonly known as:  PRILOSEC  Take 40 mg by mouth daily.     predniSONE 10 MG tablet  Commonly known as:  DELTASONE  Take 20 mg by mouth daily.     prochlorperazine 10 MG tablet  Commonly known as:  COMPAZINE  Take 1 tablet (10 mg total) by mouth every 6 (six) hours as needed for nausea or vomiting.     temazepam 30 MG capsule  Commonly known as:  RESTORIL  Take 30 mg by mouth at bedtime as needed for sleep.     XOPENEX 0.63 MG/3ML nebulizer solution  Generic drug:  levalbuterol  Take 0.63 mg by nebulization 2 (two) times daily.

## 2015-03-05 ENCOUNTER — Ambulatory Visit: Payer: 59

## 2015-03-06 ENCOUNTER — Telehealth: Payer: Self-pay | Admitting: Medical Oncology

## 2015-03-06 NOTE — Telephone Encounter (Signed)
Reports Itching after taking dilaudid. He is also allergic to   Hydrocodone, morphine He needs something different for pain . Despite my encouragement to come her he said he can wait until Monday -  because he lives in [place]. I instructed him to go to Ed if pain is uncontrolled . i told him to take benadry for the itching. Note to [facility]

## 2015-03-09 ENCOUNTER — Inpatient Hospital Stay (HOSPITAL_COMMUNITY)
Admission: EM | Admit: 2015-03-09 | Discharge: 2015-04-05 | DRG: 436 | Disposition: E | Payer: 59 | Attending: Internal Medicine | Admitting: Internal Medicine

## 2015-03-09 ENCOUNTER — Telehealth: Payer: Self-pay | Admitting: Internal Medicine

## 2015-03-09 ENCOUNTER — Encounter (HOSPITAL_COMMUNITY): Payer: Self-pay | Admitting: *Deleted

## 2015-03-09 ENCOUNTER — Emergency Department (HOSPITAL_COMMUNITY): Payer: 59

## 2015-03-09 DIAGNOSIS — Z807 Family history of other malignant neoplasms of lymphoid, hematopoietic and related tissues: Secondary | ICD-10-CM | POA: Diagnosis not present

## 2015-03-09 DIAGNOSIS — D6959 Other secondary thrombocytopenia: Secondary | ICD-10-CM | POA: Diagnosis present

## 2015-03-09 DIAGNOSIS — N179 Acute kidney failure, unspecified: Secondary | ICD-10-CM | POA: Diagnosis present

## 2015-03-09 DIAGNOSIS — I2510 Atherosclerotic heart disease of native coronary artery without angina pectoris: Secondary | ICD-10-CM | POA: Diagnosis present

## 2015-03-09 DIAGNOSIS — K219 Gastro-esophageal reflux disease without esophagitis: Secondary | ICD-10-CM | POA: Diagnosis present

## 2015-03-09 DIAGNOSIS — Z955 Presence of coronary angioplasty implant and graft: Secondary | ICD-10-CM

## 2015-03-09 DIAGNOSIS — Z87891 Personal history of nicotine dependence: Secondary | ICD-10-CM | POA: Diagnosis not present

## 2015-03-09 DIAGNOSIS — Z515 Encounter for palliative care: Secondary | ICD-10-CM | POA: Diagnosis not present

## 2015-03-09 DIAGNOSIS — E785 Hyperlipidemia, unspecified: Secondary | ICD-10-CM | POA: Diagnosis present

## 2015-03-09 DIAGNOSIS — C3431 Malignant neoplasm of lower lobe, right bronchus or lung: Secondary | ICD-10-CM | POA: Diagnosis present

## 2015-03-09 DIAGNOSIS — C7951 Secondary malignant neoplasm of bone: Secondary | ICD-10-CM | POA: Diagnosis present

## 2015-03-09 DIAGNOSIS — E860 Dehydration: Secondary | ICD-10-CM | POA: Diagnosis present

## 2015-03-09 DIAGNOSIS — E872 Acidosis, unspecified: Secondary | ICD-10-CM

## 2015-03-09 DIAGNOSIS — N189 Chronic kidney disease, unspecified: Secondary | ICD-10-CM

## 2015-03-09 DIAGNOSIS — C787 Secondary malignant neoplasm of liver and intrahepatic bile duct: Principal | ICD-10-CM | POA: Diagnosis present

## 2015-03-09 DIAGNOSIS — Z885 Allergy status to narcotic agent status: Secondary | ICD-10-CM

## 2015-03-09 DIAGNOSIS — I129 Hypertensive chronic kidney disease with stage 1 through stage 4 chronic kidney disease, or unspecified chronic kidney disease: Secondary | ICD-10-CM | POA: Diagnosis present

## 2015-03-09 DIAGNOSIS — J439 Emphysema, unspecified: Secondary | ICD-10-CM | POA: Diagnosis present

## 2015-03-09 DIAGNOSIS — I5022 Chronic systolic (congestive) heart failure: Secondary | ICD-10-CM | POA: Diagnosis present

## 2015-03-09 DIAGNOSIS — N183 Chronic kidney disease, stage 3 unspecified: Secondary | ICD-10-CM | POA: Diagnosis present

## 2015-03-09 DIAGNOSIS — Z85118 Personal history of other malignant neoplasm of bronchus and lung: Secondary | ICD-10-CM

## 2015-03-09 DIAGNOSIS — Z66 Do not resuscitate: Secondary | ICD-10-CM | POA: Diagnosis present

## 2015-03-09 DIAGNOSIS — I10 Essential (primary) hypertension: Secondary | ICD-10-CM

## 2015-03-09 DIAGNOSIS — C799 Secondary malignant neoplasm of unspecified site: Secondary | ICD-10-CM

## 2015-03-09 DIAGNOSIS — E875 Hyperkalemia: Secondary | ICD-10-CM | POA: Diagnosis not present

## 2015-03-09 DIAGNOSIS — R17 Unspecified jaundice: Secondary | ICD-10-CM

## 2015-03-09 LAB — PROTIME-INR
INR: 1.26 (ref 0.00–1.49)
INR: 1.28 (ref 0.00–1.49)
PROTHROMBIN TIME: 16.1 s — AB (ref 11.6–15.2)
Prothrombin Time: 15.9 seconds — ABNORMAL HIGH (ref 11.6–15.2)

## 2015-03-09 LAB — COMPREHENSIVE METABOLIC PANEL
ALBUMIN: 2.8 g/dL — AB (ref 3.5–5.2)
ALK PHOS: 784 U/L — AB (ref 39–117)
ALT: 711 U/L — AB (ref 0–53)
ANION GAP: 19 — AB (ref 5–15)
AST: 532 U/L — AB (ref 0–37)
BILIRUBIN TOTAL: 9.7 mg/dL — AB (ref 0.3–1.2)
BUN: 65 mg/dL — AB (ref 6–23)
CHLORIDE: 96 mmol/L (ref 96–112)
CO2: 15 mmol/L — ABNORMAL LOW (ref 19–32)
Calcium: 6.9 mg/dL — ABNORMAL LOW (ref 8.4–10.5)
Creatinine, Ser: 3.19 mg/dL — ABNORMAL HIGH (ref 0.50–1.35)
GFR calc non Af Amer: 18 mL/min — ABNORMAL LOW (ref >90)
GFR, EST AFRICAN AMERICAN: 21 mL/min — AB (ref >90)
Glucose, Bld: 93 mg/dL (ref 70–99)
Potassium: 5.5 mmol/L — ABNORMAL HIGH (ref 3.5–5.1)
SODIUM: 130 mmol/L — AB (ref 135–145)
TOTAL PROTEIN: 6.2 g/dL (ref 6.0–8.3)

## 2015-03-09 LAB — URINALYSIS, ROUTINE W REFLEX MICROSCOPIC
GLUCOSE, UA: NEGATIVE mg/dL
HGB URINE DIPSTICK: NEGATIVE
Ketones, ur: NEGATIVE mg/dL
LEUKOCYTES UA: NEGATIVE
Nitrite: NEGATIVE
PROTEIN: NEGATIVE mg/dL
Specific Gravity, Urine: 1.013 (ref 1.005–1.030)
Urobilinogen, UA: 1 mg/dL (ref 0.0–1.0)
pH: 5 (ref 5.0–8.0)

## 2015-03-09 LAB — CBC
HCT: 32.8 % — ABNORMAL LOW (ref 39.0–52.0)
Hemoglobin: 11.2 g/dL — ABNORMAL LOW (ref 13.0–17.0)
MCH: 33.8 pg (ref 26.0–34.0)
MCHC: 34.1 g/dL (ref 30.0–36.0)
MCV: 99.1 fL (ref 78.0–100.0)
Platelets: 24 10*3/uL — CL (ref 150–400)
RBC: 3.31 MIL/uL — ABNORMAL LOW (ref 4.22–5.81)
RDW: 23.6 % — AB (ref 11.5–15.5)
WBC: 14.4 10*3/uL — AB (ref 4.0–10.5)

## 2015-03-09 LAB — TYPE AND SCREEN
ABO/RH(D): O POS
ANTIBODY SCREEN: NEGATIVE

## 2015-03-09 LAB — I-STAT TROPONIN, ED: Troponin i, poc: 0.02 ng/mL (ref 0.00–0.08)

## 2015-03-09 LAB — BRAIN NATRIURETIC PEPTIDE: B NATRIURETIC PEPTIDE 5: 509 pg/mL — AB (ref 0.0–100.0)

## 2015-03-09 LAB — GLUCOSE, CAPILLARY: Glucose-Capillary: 112 mg/dL — ABNORMAL HIGH (ref 70–99)

## 2015-03-09 LAB — TSH: TSH: 2.823 u[IU]/mL (ref 0.350–4.500)

## 2015-03-09 LAB — LIPASE, BLOOD: LIPASE: 22 U/L (ref 11–59)

## 2015-03-09 MED ORDER — FENTANYL CITRATE 0.05 MG/ML IJ SOLN
50.0000 ug | Freq: Once | INTRAMUSCULAR | Status: AC
  Administered 2015-03-09: 50 ug via INTRAVENOUS
  Filled 2015-03-09: qty 2

## 2015-03-09 MED ORDER — DIPHENHYDRAMINE HCL 25 MG PO CAPS
25.0000 mg | ORAL_CAPSULE | Freq: Four times a day (QID) | ORAL | Status: DC | PRN
  Administered 2015-03-10: 25 mg via ORAL
  Filled 2015-03-09: qty 1

## 2015-03-09 MED ORDER — PROCHLORPERAZINE MALEATE 10 MG PO TABS
10.0000 mg | ORAL_TABLET | Freq: Four times a day (QID) | ORAL | Status: DC | PRN
  Filled 2015-03-09: qty 1

## 2015-03-09 MED ORDER — SODIUM CHLORIDE 0.9 % IV SOLN
INTRAVENOUS | Status: DC
  Administered 2015-03-09: 20:00:00 via INTRAVENOUS
  Administered 2015-03-10: 1000 mL via INTRAVENOUS

## 2015-03-09 MED ORDER — ACETAMINOPHEN 650 MG RE SUPP: 650.0000 mg | Freq: Four times a day (QID) | RECTAL | Status: DC | PRN

## 2015-03-09 MED ORDER — LISINOPRIL 5 MG PO TABS
5.0000 mg | ORAL_TABLET | Freq: Every day | ORAL | Status: DC
  Filled 2015-03-09: qty 1

## 2015-03-09 MED ORDER — ONDANSETRON HCL 4 MG/2ML IJ SOLN
4.0000 mg | Freq: Four times a day (QID) | INTRAMUSCULAR | Status: DC | PRN
  Administered 2015-03-09 – 2015-03-10 (×3): 4 mg via INTRAVENOUS
  Filled 2015-03-09 (×3): qty 2

## 2015-03-09 MED ORDER — ONDANSETRON HCL 4 MG PO TABS: 4.0000 mg | ORAL_TABLET | Freq: Four times a day (QID) | ORAL | Status: DC | PRN

## 2015-03-09 MED ORDER — FENTANYL CITRATE 0.05 MG/ML IJ SOLN
25.0000 ug | INTRAMUSCULAR | Status: DC | PRN
  Administered 2015-03-09 – 2015-03-10 (×2): 25 ug via INTRAVENOUS
  Filled 2015-03-09 (×2): qty 2

## 2015-03-09 MED ORDER — SODIUM CHLORIDE 0.9 % IJ SOLN
3.0000 mL | Freq: Two times a day (BID) | INTRAMUSCULAR | Status: DC
  Administered 2015-03-10: 3 mL via INTRAVENOUS

## 2015-03-09 MED ORDER — METOPROLOL TARTRATE 25 MG PO TABS
25.0000 mg | ORAL_TABLET | Freq: Two times a day (BID) | ORAL | Status: DC
  Administered 2015-03-09: 25 mg via ORAL
  Filled 2015-03-09 (×3): qty 1

## 2015-03-09 MED ORDER — ONDANSETRON HCL 4 MG/2ML IJ SOLN
4.0000 mg | Freq: Once | INTRAMUSCULAR | Status: AC
  Administered 2015-03-09: 4 mg via INTRAVENOUS
  Filled 2015-03-09: qty 2

## 2015-03-09 MED ORDER — MAGIC MOUTHWASH W/LIDOCAINE
5.0000 mL | Freq: Four times a day (QID) | ORAL | Status: DC | PRN
  Filled 2015-03-09: qty 5

## 2015-03-09 MED ORDER — SODIUM CHLORIDE 0.9 % IV BOLUS (SEPSIS)
500.0000 mL | Freq: Once | INTRAVENOUS | Status: AC
  Administered 2015-03-09: 500 mL via INTRAVENOUS

## 2015-03-09 MED ORDER — CITALOPRAM HYDROBROMIDE 20 MG PO TABS
20.0000 mg | ORAL_TABLET | Freq: Every day | ORAL | Status: DC
  Administered 2015-03-09 – 2015-03-10 (×2): 20 mg via ORAL
  Filled 2015-03-09 (×3): qty 1

## 2015-03-09 MED ORDER — ACETAMINOPHEN 325 MG PO TABS: 650.0000 mg | ORAL_TABLET | Freq: Four times a day (QID) | ORAL | Status: DC | PRN

## 2015-03-09 MED ORDER — TEMAZEPAM 15 MG PO CAPS
30.0000 mg | ORAL_CAPSULE | Freq: Every evening | ORAL | Status: DC | PRN
  Administered 2015-03-09: 30 mg via ORAL
  Filled 2015-03-09: qty 2

## 2015-03-09 MED ORDER — PANTOPRAZOLE SODIUM 40 MG PO TBEC
40.0000 mg | DELAYED_RELEASE_TABLET | Freq: Every day | ORAL | Status: DC
  Administered 2015-03-10: 40 mg via ORAL
  Filled 2015-03-09: qty 1

## 2015-03-09 MED ORDER — HYDROCODONE-ACETAMINOPHEN 5-325 MG PO TABS
1.0000 | ORAL_TABLET | ORAL | Status: DC | PRN
  Administered 2015-03-10: 1 via ORAL
  Filled 2015-03-09: qty 1

## 2015-03-09 MED ORDER — FENTANYL 25 MCG/HR TD PT72
25.0000 ug | MEDICATED_PATCH | TRANSDERMAL | Status: DC
  Administered 2015-03-09: 25 ug via TRANSDERMAL
  Filled 2015-03-09: qty 1

## 2015-03-09 MED ORDER — BUDESONIDE-FORMOTEROL FUMARATE 160-4.5 MCG/ACT IN AERO
2.0000 | INHALATION_SPRAY | Freq: Two times a day (BID) | RESPIRATORY_TRACT | Status: DC
  Administered 2015-03-09 – 2015-03-10 (×3): 2 via RESPIRATORY_TRACT
  Filled 2015-03-09 (×2): qty 6

## 2015-03-09 NOTE — Progress Notes (Addendum)
MD Baltazar Najjar paged to notify of all elevated and decreased lab values below and above normal range, asked for intervention.

## 2015-03-09 NOTE — Telephone Encounter (Signed)
s.w. pt wife and confirmed 4.6 appt cx due to pt in ER.....ok and aware

## 2015-03-09 NOTE — ED Provider Notes (Signed)
CSN: [id number]     Arrival date & time 03/14/2015  1208 History   First MD Initiated Contact with Patient 03/22/2015 1259     Chief Complaint  Patient presents with  . Weakness  . Shortness of Breath  . Jaundice   HPI The patient has a history of metastatic lung cancer that has metastasized to the liver. The patient was recently admitted to the hospital last week. At that time he had elevated LFTs. Patient went to his primary doctor's office today for follow-up. The patient has been having increasing weakness and shortness of breath. He is feeling very fatigued. His doctor noted today that the patient was tachycardic and jaundiced. He was sent to the emergency room for further evaluation. Patient denies any fevers. He continues to have pain in his upper abdomen. He has been coughing. He has not noticed any leg swelling. He's had some nausea and vomiting without diarrhea. Past Medical History  Diagnosis Date  . Hypertension   . lung ca dx'd 02/2011    xrt/chemo comp 03/30/11, stage IIIA  . Hyperlipidemia   . 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 History of angina   . CAD (coronary artery disease)   . Systolic CHF    Past Surgical History  Procedure Laterality Date  . Right vats, rt thoracotomy, rt middle and rt lower  lobectomy  06/10/2011    Dr Arlyce Dice  . Knee arthroscopy    . Tonsillectomy    . Coronary stent placement  JULY,AUGUST 2011  . Orif femoral neck fracture w/ dhs    . Left heart catheterization with coronary angiogram N/A 06/17/2014    Procedure: LEFT HEART CATHETERIZATION WITH CORONARY ANGIOGRAM;  Surgeon: Candee Furbish, MD;  Location: [facility] CATH LAB;  Service: Cardiovascular;  Laterality: N/A;   Family History  Problem Relation Age of Onset  . Cancer Father     multiple myeloma  . COPD Sister     emphysema- smoked  . Cancer Brother     multiple myeloma   History  Substance Use Topics  . Smoking  status: Former Smoker -- 0.50 packs/day for 53 years    Types: Cigarettes    Quit date: 10/05/2013  . Smokeless tobacco: Never Used  . Alcohol Use: No     Comment: occasionally    Review of Systems  Constitutional: Negative for fever.  HENT: Negative for voice change.   Respiratory: Negative for shortness of breath.   Cardiovascular: Negative for palpitations.  Gastrointestinal: Negative for abdominal pain.  Musculoskeletal: Negative for joint swelling and neck pain.  Skin: Negative for color change.  Neurological: Negative for numbness (no paresthesias).       No muscle weakness  Psychiatric/Behavioral: Negative for confusion.  All other systems reviewed and are negative.     Allergies  Hydrocodone-acetaminophen; Morphine and related; Tramadol; and Zolpidem tartrate  Home Medications   Prior to Admission medications   Medication Sig Start Date End Date Taking? Authorizing Provider  Albuterol Sulfate (PROAIR RESPICLICK) 628 (90 BASE) MCG/ACT AEPB Inhale 2 puffs into the lungs every 6 (six) hours as needed. Patient taking differently: Inhale 2 puffs into the lungs 3 (three) times daily.  11/14/14   [name], MD  Alum & Mag Hydroxide-Simeth (MAGIC MOUTHWASH W/LIDOCAINE) SOLN Take 5 mLs by mouth 4 (four) times daily as needed for mouth pain. 01/06/15   [name], MD  bevacizumab (AVASTIN)  1.25 mg/0.1 mL SOLN 1.25 mg by Intravitreal route every 30 (thirty) days.    Hayden Pedro, MD  budesonide-formoterol [facility]) 160-4.5 MCG/ACT inhaler Inhale 2 puffs into the lungs 2 (two) times daily.    Historical Provider, MD  Calcium Carbonate-Vitamin D (CALCIUM-VITAMIN D) 500-200 MG-UNIT per tablet Take 1 tablet by mouth daily.     Historical Provider, MD  citalopram (CELEXA) 20 MG tablet Take 20 mg by mouth daily.    Historical Provider, MD  furosemide (LASIX) 40 MG tablet Take 1 tablet (40 mg total) by mouth daily. 03/06/15   Thurnell Lose, MD  GuaiFENesin (MUCINEX PO) Take  600 mg by mouth 2 (two) times daily. 11/24/14   Historical Provider, MD  HYDROmorphone (DILAUDID) 2 MG tablet Take 1 tablet (2 mg total) by mouth every 6 (six) hours as needed for severe pain. Patient not taking: Reported on 01/19/2015 10/07/14   [name], MD  levalbuterol Penne Lash) 0.63 MG/3ML nebulizer solution Take 0.63 mg by nebulization 2 (two) times daily.     Historical Provider, MD  lisinopril (PRINIVIL,ZESTRIL) 5 MG tablet Take 1 tablet (5 mg total) by mouth daily. 03/06/15   Thurnell Lose, MD  metoprolol tartrate (LOPRESSOR) 25 MG tablet Take 1 tablet (25 mg total) by mouth 2 (two) times daily. 03/04/15   Thurnell Lose, MD  omeprazole (PRILOSEC) 20 MG capsule Take 40 mg by mouth daily.     Historical Provider, MD  predniSONE (DELTASONE) 10 MG tablet Take 20 mg by mouth daily.     Historical Provider, MD  prochlorperazine (COMPAZINE) 10 MG tablet Take 1 tablet (10 mg total) by mouth every 6 (six) hours as needed for nausea or vomiting. 10/16/14   [name], MD  temazepam (RESTORIL) 30 MG capsule Take 30 mg by mouth at bedtime as needed for sleep.    Josetta Huddle, MD   BP 126/79 mmHg  Pulse 119  Temp(Src) 97.5 F (36.4 C) (Oral)  Resp 24  SpO2 99% Physical Exam  Constitutional: No distress.  HENT:  Head: Normocephalic and atraumatic.  Right Ear: External ear normal.  Left Ear: External ear normal.  Eyes: Conjunctivae are normal. Right eye exhibits no discharge. Left eye exhibits no discharge. Scleral icterus is present.  Neck: Neck supple. No tracheal deviation present.  Cardiovascular: Intact distal pulses.  Tachycardia present.   Pulmonary/Chest: Effort normal and breath sounds normal. No stridor. No respiratory distress. He has no wheezes. He has no rales.  Abdominal: Soft. Bowel sounds are normal. He exhibits no distension. There is no tenderness. There is no rebound and no guarding.  Musculoskeletal: He exhibits no edema or tenderness.  Neurological: He is  alert. He has normal strength. No cranial nerve deficit (no facial droop, extraocular movements intact, no slurred speech) or sensory deficit. He exhibits normal muscle tone. He displays no seizure activity. Coordination normal.  Skin: Skin is warm and dry. No rash noted. He is not diaphoretic. There is pallor.  Psychiatric: He has a normal mood and affect.  Nursing note and vitals reviewed.   ED Course  Procedures (including critical care time) CRITICAL CARE Performed by: AUQJF,HLK Total critical care time: 30 Critical care time was exclusive of separately billable procedures and treating other patients. Critical care was necessary to treat or prevent imminent or life-threatening deterioration. Critical care was time spent personally by me on the following activities: development of treatment plan with patient and/or surrogate as well as nursing, discussions with consultants, evaluation of patient's  response to treatment, examination of patient, obtaining history from patient or surrogate, ordering and performing treatments and interventions, ordering and review of laboratory studies, ordering and review of radiographic studies, pulse oximetry and re-evaluation of patient's condition.  Labs Review Labs Reviewed  CBC - Abnormal; Notable for the following:    WBC 14.4 (*)    RBC 3.31 (*)    Hemoglobin 11.2 (*)    HCT 32.8 (*)    RDW 23.6 (*)    Platelets 24 (*)    All other components within normal limits  BRAIN NATRIURETIC PEPTIDE - Abnormal; Notable for the following:    B Natriuretic Peptide 509.0 (*)    All other components within normal limits  COMPREHENSIVE METABOLIC PANEL - Abnormal; Notable for the following:    Sodium 130 (*)    Potassium 5.5 (*)    CO2 15 (*)    BUN 65 (*)    Creatinine, Ser 3.19 (*)    Calcium 6.9 (*)    Albumin 2.8 (*)    AST 532 (*)    ALT 711 (*)    Alkaline Phosphatase 784 (*)    Total Bilirubin 9.7 (*)    GFR calc non Af Amer 18 (*)    GFR calc  Af Amer 21 (*)    Anion gap 19 (*)    All other components within normal limits  URINALYSIS, ROUTINE W REFLEX MICROSCOPIC - Abnormal; Notable for the following:    Color, Urine AMBER (*)    APPearance HAZY (*)    Bilirubin Urine MODERATE (*)    All other components within normal limits  PROTIME-INR - Abnormal; Notable for the following:    Prothrombin Time 15.9 (*)    All other components within normal limits  LIPASE, BLOOD  I-STAT TROPOININ, ED  TYPE AND SCREEN    Imaging Review Dg Chest 2 View  04/02/2015   CLINICAL DATA:  Shortness of breath and weakness for the past week with onset of cause cough today; previous right lower lobectomy for malignancy in 2012. History of COPD, jaundice  EXAM: CHEST  2 VIEW  COMPARISON:  Portable chest x-ray dated March 03, 2015, CT scan of December 22, 2014 and March 11, 2014.  FINDINGS: There is stable volume loss on the right with shift of the mediastinum rightward. The left lung is clear. The bony thorax exhibits no acute abnormality. There are old deformities of the lateral aspects of the fourth and fifth ribs on the left and sixth and seventh ribs on the right. There is wedge compression of a mid thoracic vertebral body unchanged since April 2015  IMPRESSION: Stable right-sided volume loss consistent with previous lower lobectomy as well as residual fluid and atelectasis. No new mass or increased pleural fluid volume is demonstrated.   Electronically Signed   By: David  Martinique   On: 04/03/2015 15:14     EKG Interpretation   Date/Time:  Monday March 09 2015 12:53:57 EDT Ventricular Rate:  121 PR Interval:  116 QRS Duration: 106 QT Interval:  336 QTC Calculation: 477 R Axis:   3 Text Interpretation:  Sinus tachycardia Inferior infarct , age  undetermined Anterolateral infarct , age undetermined Abnormal ECG No  significant change since last tracing Confirmed by Alann Avey  MD-J, Esten Dollar  ([id number]) on 03/07/2015 1:34:11 PM     Medications  sodium chloride  0.9 % bolus 500 mL (not administered)  ondansetron (ZOFRAN) injection 4 mg (4 mg Intravenous Given 03/29/2015 1350)  fentaNYL (SUBLIMAZE) injection  50 mcg (50 mcg Intravenous Given 03/10/2015 1350)    MDM   Final diagnoses:  Acute on chronic renal failure  Hyperbilirubinemia  Metabolic acidosis  Metastatic cancer   Review the patient's recent hospitalization. At that time he had a CT scan of his abdomen as well as an ultrasound of his abdomen. No acute surgical findings were noted.  Patient presents to the emergency room because of worsening symptoms. He continues to feel short of breath and fatigue.  Laboratory tests shows that he has acute renal failure associated with a metabolic acidosis. Suspect this is the cause of his dyspnea.  The possibility of PE as well considering his history of malignancy. His symptoms persist VQ scan might be a consideration.  The patient's bilirubin is also significantly worse. He is recurrently thrombocytopenic.  The patient's overall prognosis is really poor and that the source of his symptoms is metastatic lung cancer.  We'll consult with the medical service regarding admission for further treatment. Ultimately may be beneficial for the patient's oncologist to discuss further long term plans.     Dorie Rank, MD 03/30/2015 1537

## 2015-03-09 NOTE — Progress Notes (Signed)
Admission not completed, night shift RN aware.

## 2015-03-09 NOTE — ED Notes (Signed)
Pt has metastatic lung cancer and is being treated with chemo.  Pt cannot where mask.  Pt is here with sob, tachypneic, weakness, dry heaves.  Sent here for blood and IVFs.  Pt is jaundiced

## 2015-03-09 NOTE — ED Notes (Signed)
Pt. Ambulated independently to restroom.

## 2015-03-09 NOTE — ED Notes (Signed)
Paged Dr. Hartford Poli to Martie Round, [place]

## 2015-03-09 NOTE — Progress Notes (Signed)
[facility] Admissions paged to notify patient has been admitted to floor.

## 2015-03-09 NOTE — Progress Notes (Signed)
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 Past Surgical History:   has past surgical history that includes Right VATS, Rt thoracotomy, Rt middle and Rt lower  Lobectomy (06/10/2011); Knee arthroscopy; Tonsillectomy; Coronary stent placement (JULY,AUGUST 2011); ORIF femoral neck fracture w/ dhs; and left heart catheterization with coronary angiogram (N/A, 06/17/2014). Social History:   reports that he quit smoking about 17 months ago. His smoking use included Cigarettes. He has a 26.5 pack-year smoking history. He has never used smokeless tobacco. He reports that he does not drink alcohol or use illicit drugs.  Todd Villanueva is a 74 y.o. male patient admitted from ED:   Last Documented Vital Signs: Blood pressure 132/80, pulse 114, temperature 97.5 F (36.4 C), temperature source Oral, resp. rate 16, height 5\' 6"  (1.676 m), weight 83.008 kg (183 lb), SpO2 100 %.  Cardiac Monitoring: Box # 19. Cardiac monitor yields:ST  IV Fluids:  IV in place, occlusive dsg intact without redness, IV cath forearm left, condition patent and no redness normal saline.   Skin: WDL  Patient/Family orientated to room. Information packet given to patient/family. Admission inpatient armband information verified with patient/family to include name and date of birth and placed on patient arm. Side rails up x 2, fall assessment and education completed with patient/family. Patient/family able to verbalize understanding of risk associated with falls and verbalized understanding to call for assistance  before getting out of bed. Call light within reach. Patient/family able to voice and demonstrate understanding of unit orientation instructions.    Will continue to evaluate and treat per MD orders.   Hendricks Limes RN, BS, BSN

## 2015-03-09 NOTE — H&P (Signed)
[facility] History and Physical  JLYNN LANGILLE VWU:[id number] DOB: 02/23/1941 DOA: 03/20/2015  Referring physician: EDP PCP: [name], MD   Chief Complaint: Generalized weakness, shortness and tachypnea.  HPI: NASEEM VARDEN is a 74 y.o. male with past medical history of stage IV metastatic lung cancer with metastases to the liver and bones, a shunt was recently getting chemotherapy, got 4 out of 6 scheduled treatments, the rest was postponed because of thrombocytopenia. Patient was recently in the hospital because of upper abdominal pain attributed to liver metastases. Patient was discharged home on 03/04/2015, sent back from his primary care physician office today because of generalized weakness, dehydration and pallor. In the ED patient was found to have worsening of his liver function tests, his total bilirubin jumped from 3.1 to 9.7, his platelets dipped from 125 to 24. Patient is still complaining about right upper quadrant abdominal pain.  Review of Systems:  Constitutional: Generalized weakness Eyes: negative for irritation, redness and visual disturbance Ears, nose, mouth, throat, and face: negative for earaches, epistaxis, nasal congestion and sore throat Respiratory: negative for cough, dyspnea on exertion, sputum and wheezing Cardiovascular: negative for chest pain, dyspnea, lower extremity edema, orthopnea, palpitations and syncope Gastrointestinal: Has nausea Genitourinary:negative for dysuria, frequency and hematuria Hematologic/lymphatic: negative for bleeding, easy bruising and lymphadenopathy Musculoskeletal:negative for arthralgias, muscle weakness and stiff joints Neurological: negative for coordination problems, gait problems, headaches and weakness Endocrine: negative for diabetic symptoms including polydipsia, polyuria and weight loss Allergic/Immunologic: negative for anaphylaxis, hay fever and urticaria  Past Medical History  Diagnosis Date    . Hypertension   . lung ca dx'd 02/2011    xrt/chemo comp 03/30/11, stage IIIA  . Hyperlipidemia   . 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 History of angina   . CAD (coronary artery disease)   . Systolic CHF    Past Surgical History  Procedure Laterality Date  . Right vats, rt thoracotomy, rt middle and rt lower  lobectomy  06/10/2011    Dr Arlyce Dice  . Knee arthroscopy    . Tonsillectomy    . Coronary stent placement  JULY,AUGUST 2011  . Orif femoral neck fracture w/ dhs    . Left heart catheterization with coronary angiogram N/A 06/17/2014    Procedure: LEFT HEART CATHETERIZATION WITH CORONARY ANGIOGRAM;  Surgeon: Candee Furbish, MD;  Location: [facility] CATH LAB;  Service: Cardiovascular;  Laterality: N/A;   Social History:   reports that he quit smoking about 17 months ago. His smoking use included Cigarettes. He has a 26.5 pack-year smoking history. He has never used smokeless tobacco. He reports that he does not drink alcohol or use illicit drugs.  Allergies  Allergen Reactions  . Hydrocodone-Acetaminophen Itching  . Morphine And Related Other (See Comments)    Hallucinations, headache  . Tramadol     Pt states will not take med b/c it does not work   . Zolpidem Tartrate Other (See Comments)    nightmares    Family History  Problem Relation Age of Onset  . Cancer Father     multiple myeloma  . COPD Sister     emphysema- smoked  . Cancer Brother     multiple myeloma     Prior to Admission medications   Medication Sig Start Date End Date Taking? Authorizing Provider  Albuterol Sulfate (PROAIR RESPICLICK) 295 (90 BASE) MCG/ACT AEPB Inhale 2 puffs into  the lungs every 6 (six) hours as needed. Patient taking differently: Inhale 2 puffs into the lungs 3 (three) times daily.  11/14/14  Yes [name], MD  Alum & Mag Hydroxide-Simeth (MAGIC MOUTHWASH W/LIDOCAINE) SOLN Take 5 mLs by mouth 4 (four) times daily as  needed for mouth pain. 01/06/15  Yes [name], MD  budesonide-formoterol [facility]) 160-4.5 MCG/ACT inhaler Inhale 2 puffs into the lungs 2 (two) times daily.   Yes Historical Provider, MD  Calcium Carbonate-Vitamin D (CALCIUM-VITAMIN D) 500-200 MG-UNIT per tablet Take 1 tablet by mouth daily.    Yes Historical Provider, MD  citalopram (CELEXA) 20 MG tablet Take 20 mg by mouth daily.   Yes Historical Provider, MD  furosemide (LASIX) 40 MG tablet Take 1 tablet (40 mg total) by mouth daily. 03/06/15  Yes Thurnell Lose, MD  GuaiFENesin (MUCINEX PO) Take 600 mg by mouth 2 (two) times daily. 11/24/14  Yes Historical Provider, MD  levalbuterol Penne Lash) 0.63 MG/3ML nebulizer solution Take 0.63 mg by nebulization 2 (two) times daily.    Yes Historical Provider, MD  lisinopril (PRINIVIL,ZESTRIL) 5 MG tablet Take 1 tablet (5 mg total) by mouth daily. 03/06/15  Yes Thurnell Lose, MD  omeprazole (PRILOSEC) 20 MG capsule Take 40 mg by mouth daily.    Yes Historical Provider, MD  predniSONE (DELTASONE) 10 MG tablet Take 20 mg by mouth daily.    Yes Historical Provider, MD  temazepam (RESTORIL) 30 MG capsule Take 30 mg by mouth at bedtime as needed for sleep.   Yes Josetta Huddle, MD  bevacizumab (AVASTIN) 1.25 mg/0.1 mL SOLN 1.25 mg by Intravitreal route every 30 (thirty) days.    Hayden Pedro, MD  HYDROmorphone (DILAUDID) 2 MG tablet Take 1 tablet (2 mg total) by mouth every 6 (six) hours as needed for severe pain. Patient not taking: Reported on 01/19/2015 10/07/14   [name], MD  metoprolol tartrate (LOPRESSOR) 25 MG tablet Take 1 tablet (25 mg total) by mouth 2 (two) times daily. Patient not taking: Reported on 03/08/2015 03/04/15   Thurnell Lose, MD  prochlorperazine (COMPAZINE) 10 MG tablet Take 1 tablet (10 mg total) by mouth every 6 (six) hours as needed for nausea or vomiting. 10/16/14   [name], MD   Physical Exam: Filed Vitals:   03/13/2015 1642  BP: 119/70  Pulse:   Temp:     Resp: 28   Constitutional: Oriented to person, place, and time. Well-developed and well-nourished. Cooperative.  Head: Normocephalic and atraumatic.  Nose: Nose normal.  Mouth/Throat: Uvula is midline, oropharynx is clear and moist and mucous membranes are normal.  Eyes: Conjunctivae and EOM are normal. Pupils are equal, round, and reactive to light.  Neck: Trachea normal and normal range of motion. Neck supple.  Cardiovascular: Normal rate, regular rhythm, S1 normal, S2 normal, normal heart sounds and intact distal pulses.   Pulmonary/Chest: Effort normal and breath sounds normal.  Abdominal: Soft. Bowel sounds are normal. There is no hepatosplenomegaly. There is no tenderness.  Musculoskeletal: Normal range of motion.  Neurological: Alert and oriented to person, place, and time. Has normal strength. No cranial nerve deficit or sensory deficit.  Skin: Skin is warm, dry and intact.  Psychiatric: Has a normal mood and affect. Speech is normal and behavior is normal.   Labs on Admission:  Basic Metabolic Panel:  Recent Labs Lab 03/03/15 1105 03/03/15 1111 03/04/15 0521 04/04/2015 1342  NA 134* 133* 137 130*  K 4.5 4.4 4.4 5.5*  CL  99 98 104 96  CO2 24  --  22 15*  GLUCOSE 97 95 81 93  BUN 45* 45* 41* 65*  CREATININE 1.97* 1.80* 1.71* 3.19*  CALCIUM 7.9*  --  7.7* 6.9*   Liver Function Tests:  Recent Labs Lab 03/03/15 1105 03/04/15 0521 03/12/2015 1342  AST 173* 192* 532*  ALT 282* 282* 711*  ALKPHOS 400* 427* 784*  BILITOT 2.6* 3.1* 9.7*  PROT 6.1 5.8* 6.2  ALBUMIN 3.0* 2.8* 2.8*    Recent Labs Lab 03/29/2015 1342  LIPASE 22   No results for input(s): AMMONIA in the last 168 hours. CBC:  Recent Labs Lab 03/03/15 1105 03/03/15 1111 03/04/15 0521 03/06/2015 1342  WBC 10.0  --  9.1 14.4*  NEUTROABS 7.4  --   --   --   HGB 10.5* 11.9* 10.4* 11.2*  HCT 31.9* 35.0* 30.7* 32.8*  MCV 101.9*  --  101.0* 99.1  PLT 67*  --  125* 24*   Cardiac Enzymes: No results  for input(s): CKTOTAL, CKMB, CKMBINDEX, TROPONINI in the last 168 hours.  BNP (last 3 results)  Recent Labs  03/13/2015 1342  BNP 509.0*    ProBNP (last 3 results) No results for input(s): PROBNP in the last 8760 hours.  CBG: No results for input(s): GLUCAP in the last 168 hours.  Radiological Exams on Admission: Dg Chest 2 View  03/24/2015   CLINICAL DATA:  Shortness of breath and weakness for the past week with onset of cause cough today; previous right lower lobectomy for malignancy in 2012. History of COPD, jaundice  EXAM: CHEST  2 VIEW  COMPARISON:  Portable chest x-ray dated March 03, 2015, CT scan of December 22, 2014 and March 11, 2014.  FINDINGS: There is stable volume loss on the right with shift of the mediastinum rightward. The left lung is clear. The bony thorax exhibits no acute abnormality. There are old deformities of the lateral aspects of the fourth and fifth ribs on the left and sixth and seventh ribs on the right. There is wedge compression of a mid thoracic vertebral body unchanged since April 2015  IMPRESSION: Stable right-sided volume loss consistent with previous lower lobectomy as well as residual fluid and atelectasis. No new mass or increased pleural fluid volume is demonstrated.   Electronically Signed   By: David  Martinique   On: 03/10/2015 15:14    EKG: Independently reviewed.   Assessment/Plan Principal Problem:   Hyperbilirubinemia Active Problems:   Cancer of lower lobe of right lung   Essential hypertension   Acute renal failure   CKD (chronic kidney disease), stage III   Hyperkalemia   Liver metastases    RUQ abdominal pain Patient sent from his primary care office because of pallor and jaundice and dehydration. Patient complains about right upper quadrant pain likely secondary to liver metastases. Despite the CT scan of abdomen showing liver metastases improving in January I think his pain is secondary to his metastases.  Acute kidney injury Acute  kidney injury on background of chronic kidney disease stage III Baseline creatinine is 1.7, patient presented with creatinine of 3.19. Hydrate with IV fluids, check BMP in a.m.  Increased LFTs Likely secondary to liver metastases, worsening, check CMP in a.m. Including hyperbilirubinemia and transaminitis as well as elevated alk phosphatase. See last time by gastroenterology and recommended conservative management.  Stage IV metastatic lung cancer Patient was getting chemotherapy, got 4 out of 6 treatments, last 2 treatments postponed because of thrombocytopenia. According to  last CT scan done in January his liver metastases were shrinking. Currently cannot tolerate the chemotherapy because of thrombocytopenia.  Thrombocytopenia Platelets dropped from 125-24. This is the lowest has been. Platelets went down before 236 but recovered when chemotherapy discontinued. Follow platelets closely, check CBC in a.m. Can always refer to Dr. Julien Nordmann for recommendation. No DVT prophylaxis because of the low platelets.   Code Status: DNR/DNI confirmed with patient presence of his wife. Family Communication: Plan discussed with the patient in the presence of his wife at bedside Disposition Plan: Telemetry, fall risk  Time spent: 70 minutes  Birdie Hopes, MD [facility] Pager [phone]

## 2015-03-09 NOTE — ED Notes (Signed)
Pt. Requesting to not be admitted to [facility]. Notified bed control, they stated they would reassign bed.

## 2015-03-10 ENCOUNTER — Inpatient Hospital Stay (HOSPITAL_COMMUNITY): Payer: 59

## 2015-03-10 DIAGNOSIS — C787 Secondary malignant neoplasm of liver and intrahepatic bile duct: Principal | ICD-10-CM

## 2015-03-10 DIAGNOSIS — E875 Hyperkalemia: Secondary | ICD-10-CM

## 2015-03-10 DIAGNOSIS — C801 Malignant (primary) neoplasm, unspecified: Secondary | ICD-10-CM

## 2015-03-10 DIAGNOSIS — N179 Acute kidney failure, unspecified: Secondary | ICD-10-CM

## 2015-03-10 LAB — COMPREHENSIVE METABOLIC PANEL
ALK PHOS: 842 U/L — AB (ref 39–117)
ALT: 725 U/L — ABNORMAL HIGH (ref 0–53)
AST: 640 U/L — AB (ref 0–37)
Albumin: 2.7 g/dL — ABNORMAL LOW (ref 3.5–5.2)
Anion gap: 15 (ref 5–15)
BILIRUBIN TOTAL: 11.1 mg/dL — AB (ref 0.3–1.2)
BUN: 70 mg/dL — ABNORMAL HIGH (ref 6–23)
CHLORIDE: 100 mmol/L (ref 96–112)
CO2: 16 mmol/L — ABNORMAL LOW (ref 19–32)
CREATININE: 3.29 mg/dL — AB (ref 0.50–1.35)
Calcium: 6.6 mg/dL — ABNORMAL LOW (ref 8.4–10.5)
GFR calc Af Amer: 20 mL/min — ABNORMAL LOW (ref >90)
GFR calc non Af Amer: 17 mL/min — ABNORMAL LOW (ref >90)
Glucose, Bld: 68 mg/dL — ABNORMAL LOW (ref 70–99)
POTASSIUM: 6.1 mmol/L — AB (ref 3.5–5.1)
Sodium: 131 mmol/L — ABNORMAL LOW (ref 135–145)
Total Protein: 6.1 g/dL (ref 6.0–8.3)

## 2015-03-10 LAB — CBC
HCT: 32.2 % — ABNORMAL LOW (ref 39.0–52.0)
HCT: 33.1 % — ABNORMAL LOW (ref 39.0–52.0)
HEMOGLOBIN: 11.3 g/dL — AB (ref 13.0–17.0)
Hemoglobin: 11 g/dL — ABNORMAL LOW (ref 13.0–17.0)
MCH: 34.4 pg — AB (ref 26.0–34.0)
MCH: 34.6 pg — ABNORMAL HIGH (ref 26.0–34.0)
MCHC: 34.2 g/dL (ref 30.0–36.0)
MCHC: 34.1 g/dL (ref 30.0–36.0)
MCV: 100.6 fL — AB (ref 78.0–100.0)
MCV: 101.2 fL — AB (ref 78.0–100.0)
Platelets: 21 10*3/uL — CL (ref 150–400)
Platelets: 20 10*3/uL — CL (ref 150–400)
RBC: 3.2 MIL/uL — AB (ref 4.22–5.81)
RBC: 3.27 MIL/uL — AB (ref 4.22–5.81)
RDW: 23.4 % — ABNORMAL HIGH (ref 11.5–15.5)
RDW: 23.6 % — AB (ref 11.5–15.5)
WBC: 14.4 10*3/uL — ABNORMAL HIGH (ref 4.0–10.5)
WBC: 14.4 10*3/uL — ABNORMAL HIGH (ref 4.0–10.5)

## 2015-03-10 LAB — BASIC METABOLIC PANEL
Anion gap: 23 — ABNORMAL HIGH (ref 5–15)
BUN: 76 mg/dL — ABNORMAL HIGH (ref 6–23)
CALCIUM: 6.2 mg/dL — AB (ref 8.4–10.5)
CO2: 8 mmol/L — CL (ref 19–32)
Chloride: 100 mmol/L (ref 96–112)
Creatinine, Ser: 3.71 mg/dL — ABNORMAL HIGH (ref 0.50–1.35)
GFR calc Af Amer: 17 mL/min — ABNORMAL LOW (ref >90)
GFR, EST NON AFRICAN AMERICAN: 15 mL/min — AB (ref >90)
Glucose, Bld: 100 mg/dL — ABNORMAL HIGH (ref 70–99)
Potassium: 5.9 mmol/L — ABNORMAL HIGH (ref 3.5–5.1)
Sodium: 131 mmol/L — ABNORMAL LOW (ref 135–145)

## 2015-03-10 MED ORDER — DEXTROSE 50 % IV SOLN
1.0000 | Freq: Once | INTRAVENOUS | Status: AC
  Administered 2015-03-10: 50 mL via INTRAVENOUS
  Filled 2015-03-10: qty 50

## 2015-03-10 MED ORDER — BISACODYL 10 MG RE SUPP
10.0000 mg | Freq: Every day | RECTAL | Status: DC | PRN
  Administered 2015-03-10: 10 mg via RECTAL
  Filled 2015-03-10: qty 1

## 2015-03-10 MED ORDER — INSULIN ASPART 100 UNIT/ML IV SOLN: 8.0000 [IU] | Freq: Once | INTRAVENOUS | Status: DC

## 2015-03-10 MED ORDER — PREDNISONE 20 MG PO TABS
20.0000 mg | ORAL_TABLET | Freq: Every day | ORAL | Status: DC
  Administered 2015-03-10: 20 mg via ORAL
  Filled 2015-03-10 (×2): qty 1

## 2015-03-10 MED ORDER — METOPROLOL TARTRATE 25 MG PO TABS
25.0000 mg | ORAL_TABLET | Freq: Two times a day (BID) | ORAL | Status: DC
  Filled 2015-03-10 (×3): qty 1

## 2015-03-10 MED ORDER — POLYETHYLENE GLYCOL 3350 17 G PO PACK
17.0000 g | PACK | Freq: Every day | ORAL | Status: DC
  Administered 2015-03-10: 17 g via ORAL
  Filled 2015-03-10 (×2): qty 1

## 2015-03-10 MED ORDER — SENNA 8.6 MG PO TABS
2.0000 | ORAL_TABLET | Freq: Every day | ORAL | Status: DC
  Filled 2015-03-10 (×2): qty 2

## 2015-03-10 MED ORDER — INSULIN REGULAR HUMAN 100 UNIT/ML IJ SOLN
8.0000 [IU] | Freq: Once | INTRAMUSCULAR | Status: AC
  Administered 2015-03-10: 8 [IU] via INTRAVENOUS
  Filled 2015-03-10: qty 0.08

## 2015-03-10 MED ORDER — ALBUTEROL SULFATE (2.5 MG/3ML) 0.083% IN NEBU
2.5000 mg | INHALATION_SOLUTION | RESPIRATORY_TRACT | Status: DC | PRN
  Administered 2015-03-10: 2.5 mg via RESPIRATORY_TRACT

## 2015-03-10 MED ORDER — SODIUM POLYSTYRENE SULFONATE 15 GM/60ML PO SUSP
30.0000 g | Freq: Once | ORAL | Status: AC
  Administered 2015-03-10: 30 g via ORAL
  Filled 2015-03-10: qty 120

## 2015-03-10 MED ORDER — MORPHINE SULFATE (CONCENTRATE) 10 MG/0.5ML PO SOLN: 10.0000 mg | ORAL | Status: DC | PRN

## 2015-03-10 MED ORDER — ALBUTEROL SULFATE (2.5 MG/3ML) 0.083% IN NEBU
INHALATION_SOLUTION | RESPIRATORY_TRACT | Status: AC
  Administered 2015-03-10: 2.5 mg via RESPIRATORY_TRACT
  Filled 2015-03-10: qty 3

## 2015-03-10 MED ORDER — STERILE WATER FOR INJECTION IV SOLN
INTRAVENOUS | Status: DC
  Administered 2015-03-10: 23:00:00 via INTRAVENOUS
  Filled 2015-03-10 (×2): qty 850

## 2015-03-10 MED ORDER — SODIUM POLYSTYRENE SULFONATE 15 GM/60ML PO SUSP
45.0000 g | Freq: Once | ORAL | Status: DC
  Filled 2015-03-10: qty 180

## 2015-03-10 MED ORDER — HYDROMORPHONE HCL 1 MG/ML IJ SOLN
1.0000 mg | INTRAMUSCULAR | Status: DC | PRN
  Administered 2015-03-10 – 2015-03-11 (×2): 1 mg via INTRAVENOUS
  Filled 2015-03-10 (×2): qty 1

## 2015-03-10 NOTE — Telephone Encounter (Signed)
error 

## 2015-03-10 NOTE — Plan of Care (Signed)
Problem: Phase I Progression Outcomes Goal: OOB as tolerated unless otherwise ordered Outcome: Completed/Met Date Met:  03/10/15 OOB to chair Goal: Initial discharge plan identified Outcome: Completed/Met Date Met:  03/10/15 To return home with wife

## 2015-03-10 NOTE — Progress Notes (Signed)
CRITICAL VALUE ALERT  Critical value received:  K+ 6.1  Date of notification:  March 10, 2015  Time of notification:  7262  Critical value read back:Yes.    Nurse who received alert:  Alphonzo Lemmings, RN  MD notified (1st page):  Dr. Sloan Leiter  Time of first page:  MD up on unit & informed  MD notified (2nd page):  Time of second page:  Responding MD:  Dr. Sloan Leiter, to place new orders.  Time MD responded:  1005

## 2015-03-10 NOTE — Progress Notes (Signed)
CRITICAL VALUE ALERT  Critical value received:  CO2 8                                         Ca 6.2  Date of notification:  03/10/2015   Time of notification:  8:59 PM   Critical value read back:Yes.    Nurse who received alert:  Merri Ray  MD notified (1st page):  Tylene Fantasia, NP  Time of first page:  8:59 PM   MD notified (2nd page):  Time of second page:  Responding MD:  Tylene Fantasia  Time MD responded:  2139

## 2015-03-10 NOTE — Progress Notes (Signed)
PATIENT DETAILS Name: Todd Villanueva Age: 74 y.o. Sex: male Date of Birth: 20-Apr-1941 Admit Date: 03/27/2015 Admitting Physician Verlee Monte, MD [id number] [name], MD  Subjective: Fatigue, sleepy. Complains of abdominal pain.  Assessment/Plan: Principal Problem:   RUQ Abdominal Pain: Likely secondary to liver metastases. Very poor prognosis, case discussed with both primary oncologist over the phone (Dr. Julien Nordmann), patient and spouse at bedside-all agreeable to initiate palliative measures. Patient and spouse agreeable for DO NOT RESUSCITATE status. We will begin more aggressive symptom control with narcotics. Have consulted palliative care.  Active Problems:   Jaundice with worsening LFTs: Most recent CT scan abdomen on 3/29 did show hepatic metastases-but a poor study given a noncontrast exam, was seen by gastroenterology during his last admission-worsening LFTs was thought to be secondary to metastatic disease. Hepatitis panel was negative. Repeat right upper quadrant ultrasound on 4/5 shows no major gallbladder pathology, common bile duct was not dilated. Still suspect that this is from metastatic disease, irrespective of etiology-prognosis is very poor given her frailty, worsening renal function.We will start some comfort measures with narcotics at this point, however if numbers keep on worsening, suspect would best benefit from initiation of comfort measures and hospice care. Both patient and spouse aware.    Acute on chronic renal disease stage III: Multifactorial etiology, but predominantly secondary to prerenal azotemia from acute illness, diuretic and lisinopril use. Continue to hold Lasix, have discontinued lisinopril. Continue IV fluids, will check renal ultrasound. If no significant improvement, family aware, that patient would best benefit from initiation of hospice/comfort measures. Given rapid deterioration, advanced malignancy-suspect would not be a good candidate  for dialysis-both patient and spouse agree.    Hyperkalemia: Secondary to ACE inhibitor and worsening renal function. Treated with Kayexalate, IV insulin with D50. Will repeat chemistry panel later today. As noted above, ACE inhibitor has been discontinued.    Thrombocytopenia: Suspect from liver disease and possible underlying malignancy with advanced metastases. Supportive care for now. Case discussed with Dr. Julien Nordmann who recommended platelet transfusion. Since patient and family are now focusing more on comfort, we will hold platelet transfusion till evidence of bleeding is evident.     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 Julien Nordmann, given above noted issues, he is agreeable with a palliative care consult. Family and patient are aware of very poor prognosis. If no improvement in hepatic and renal function in the next few days, suspect would best benefit from initiation of full comfort measures.    GERD: Continue PPI     Chronic systolic heart failure: Currently compensated, currently in IV fluids for ARF-cautiously continue. Not on ACE inhibitor given worsening renal function. Cautiously continue with metoprolol for now    History of CAD: Given severe thrombocytopenia, all antiplatelet agents on hold.    History of COPD: Lungs are clear, continue with as needed bronchodilators. Follow     Disposition: Remain inpatient  Antibiotics:  None    Anti-infectives    None      DVT Prophylaxis:  SCD's  Code Status: DNR  Family Communication  spouse at bedside  Procedures:  None  CONSULTS:  Palliative Care  Time spent 40 minutes-which includes 50% of the time with face-to-face with patient/ family and coordinating care related to the above assessment and plan.  MEDICATIONS: Scheduled Meds: . budesonide-formoterol  2 puff Inhalation BID  . citalopram  20 mg Oral Daily  . dextrose  1 ampule Intravenous Once  .  fentaNYL  25 mcg Transdermal Q72H  . insulin regular  8 Units Intravenous Once  . metoprolol tartrate  25 mg Oral BID  . pantoprazole  40 mg Oral Daily  . predniSONE  20 mg Oral Daily  . sodium chloride  3 mL Intravenous Q12H  . sodium polystyrene  30 g Oral Once   Continuous Infusions: . sodium chloride 1,000 mL (03/10/15 1148)   PRN Meds:.acetaminophen **OR** acetaminophen, bisacodyl, diphenhydrAMINE, fentaNYL, HYDROcodone-acetaminophen, magic mouthwash w/lidocaine, ondansetron **OR** ondansetron (ZOFRAN) IV, prochlorperazine, temazepam    PHYSICAL EXAM: Vital signs in last 24 hours: Filed Vitals:   03/25/2015 2105 03/10/15 0540 03/10/15 1100 03/10/15 1159  BP: 117/66 100/59  94/68  Pulse: 111 108    Temp: 97.6 F (36.4 C) 98 F (36.7 C)    TempSrc: Oral Oral    Resp: 22 22    Height:      Weight:      SpO2: 100% 92% 98%     Weight change:  Filed Weights   03/12/2015 1856  Weight: 83.008 kg (183 lb)   Body mass index is 29.55 kg/(m^2).   Gen Exam: Awake and alert with clear speech.  Appears chronically ill Neck: Supple, No JVD.   Chest: B/L Clear.   CVS: S1 S2 Regular Abdomen: soft, BS +, non tender, non distended.  Extremities: no edema, lower extremities warm to touch. Neurologic: Non Focal.   Skin: No Rash.   Wounds: N/A.   Intake/Output from previous day:  Intake/Output Summary (Last 24 hours) at 03/10/15 1327 Last data filed at 03/07/2015 2300  Gross per 24 hour  Intake    565 ml  Output   1800 ml  Net  -1235 ml     LAB RESULTS: CBC  Recent Labs Lab 03/04/15 0521 03/29/2015 1342 03/10/15 0801  WBC 9.1 14.4* 14.4*  HGB 10.4* 11.2* 11.0*  HCT 30.7* 32.8* 32.2*  PLT 125* 24* 21*  MCV 101.0* 99.1 100.6*  MCH 34.2* 33.8 34.4*  MCHC 33.9 34.1 34.2  RDW 23.0* 23.6* 23.4*    Chemistries   Recent Labs Lab 03/04/15 0521 03/17/2015 1342 03/10/15 0801  NA 137 130* 131*  K 4.4 5.5* 6.1*  CL 104 96 100  CO2 22 15* 16*  GLUCOSE 81 93 68*  BUN 41*  65* 70*  CREATININE 1.71* 3.19* 3.29*  CALCIUM 7.7* 6.9* 6.6*    CBG:  Recent Labs Lab 03/13/2015 2155  GLUCAP 112*    GFR Estimated Creatinine Clearance: 20.8 mL/min (by C-G formula based on Cr of 3.29).  Coagulation profile  Recent Labs Lab 03/24/2015 1342 03/20/2015 2148  INR 1.26 1.28    Cardiac Enzymes No results for input(s): CKMB, TROPONINI, MYOGLOBIN in the last 168 hours.  Invalid input(s): CK  Invalid input(s): POCBNP No results for input(s): DDIMER in the last 72 hours. No results for input(s): HGBA1C in the last 72 hours. No results for input(s): CHOL, HDL, LDLCALC, TRIG, CHOLHDL, LDLDIRECT in the last 72 hours.  Recent Labs  03/28/2015 2148  TSH 2.823   No results for input(s): VITAMINB12, FOLATE, FERRITIN, TIBC, IRON, RETICCTPCT in the last 72 hours.  Recent Labs  03/29/2015 1342  LIPASE 22    Urine Studies No results for input(s): UHGB, CRYS in the last 72 hours.  Invalid input(s): UACOL, UAPR, USPG, UPH, UTP, UGL, UKET, UBIL, UNIT, UROB, ULEU, UEPI, UWBC, URBC, UBAC, CAST, UCOM, BILUA  MICROBIOLOGY: Recent Results (from the past 240 hour(s))  Clostridium Difficile by PCR     Status: None   Collection Time: 03/03/15  6:41 PM  Result Value Ref Range Status   C difficile by pcr NEGATIVE NEGATIVE Final    RADIOLOGY STUDIES/RESULTS: Ct Abdomen Pelvis Wo Contrast  03/03/2015   CLINICAL DATA:  Right lower quadrant abdominal pain . 100 therapy for right lung cancer. Renal insufficiency. No IV contrast.  EXAM: CT ABDOMEN AND PELVIS WITHOUT CONTRAST  TECHNIQUE: Multidetector CT imaging of the abdomen and pelvis was performed following the standard protocol without IV contrast.  COMPARISON:  CT 10/15/14  FINDINGS: Lower chest: Postsurgical change the right hemi thorax volume loss. Consolidation in the medial right lower lobe adjacent to surgical clips consistent with posttherapy collapse / consolidation. No change from prior. There is a loculated fluid  collection in the inferior posterior right hemi thorax measuring 5.4 by 3.8 cm compared to 5.8 x 3.7 cm. There is a mild reticular pattern in the peripheral left lower lobe. No nodularity  Hepatobiliary: Several hypodense lesions within liver which cannot further characterized on this noncontrast exam. There were previous hypermetabolic liver metastasis on PET-CT scan of 09/30/2014. In comparison to this region on noncontrast CT several lesions are smaller.  Pancreas: Pancreas is normal. No ductal dilatation. No pancreatic inflammation.  Spleen: Normal normal spleen.  Adrenals/urinary tract: Normal adrenal glands are normal. The kidneys ureters bladder.  Stomach/Bowel: Stomach, small bowel, and colon are normal.  Vascular/Lymphatic: Abdominal aorta is normal caliber with atherosclerotic calcification. There is no retroperitoneal or periportal lymphadenopathy. No pelvic lymphadenopathy.  Reproductive: Prostate gland is normal. Small inguinal hernias noted.  Musculoskeletal: No aggressive osseous lesion. Thoracotomy defect in the posterior right hemi thorax ribs. Sclerotic change in the right humeral head is felt to be degenerative.  IMPRESSION: 1. No acute findings abdomen pelvis. 2. Hepatic metastasis difficult to assess on this noncontrast exam. 3. No nephrolithiasis or ureterolithiasis. 4. Normal appendix. 5. Postsurgical change in the right hemi thorax with consolidation and loculated fluid collection. These findings are not significantly changed 10/15/2014. 6. Stable sclerotic change in the right femoral head. Cannot exclude avascular necrosis.   Electronically Signed   By: Suzy Bouchard M.D.   On: 03/03/2015 13:20   Dg Chest 2 View  03/19/2015   CLINICAL DATA:  Shortness of breath and weakness for the past week with onset of cause cough today; previous right lower lobectomy for malignancy in 2012. History of COPD, jaundice  EXAM: CHEST  2 VIEW  COMPARISON:  Portable chest x-ray dated March 03, 2015, CT  scan of December 22, 2014 and March 11, 2014.  FINDINGS: There is stable volume loss on the right with shift of the mediastinum rightward. The left lung is clear. The bony thorax exhibits no acute abnormality. There are old deformities of the lateral aspects of the fourth and fifth ribs on the left and sixth and seventh ribs on the right. There is wedge compression of a mid thoracic vertebral body unchanged since April 2015  IMPRESSION: Stable right-sided volume loss consistent with previous lower lobectomy as well as residual fluid and atelectasis. No new mass or increased pleural fluid volume is demonstrated.   Electronically Signed   By: David  Martinique   On: 03/30/2015 15:14   US Abdomen Complete  03/03/2015   CLINICAL DATA:  74 year old male with abdominal pain. Initial encounter. History of CT-guided liver biopsy in November 2015 reportedly revealing metastatic disease. Lung cancer recurrence.  EXAM: ULTRASOUND ABDOMEN COMPLETE  COMPARISON:  Noncontrast CT  Abdomen and Pelvis 1210 hr today reported separately.  FINDINGS: Gallbladder: Contracted. Up to mild gallbladder wall thickening at 3 mm. No sonographic Murphy sign elicited. No echogenic sludge or stones identified.  Common bile duct: Diameter: 5 mm, normal  Liver: Miliary type hepatic metastases are poorly visible with sonographic technique. These were more evident on the noncontrast study earlier today. There is a mildly lobulated hepatic contour. No intrahepatic biliary ductal dilatation identified.  IVC: No abnormality visualized.  Pancreas: Incompletely visualized due to overlying bowel gas, visualized portions within normal limits.  Spleen: Size and appearance within normal limits.  Right Kidney: Length: 10.3 cm No hydronephrosis or renal mass. Increased cortical echogenicity.  Left Kidney: Length: 10.3 cm. No hydronephrosis or left renal mass. Normal to increased cortical echogenicity.  Abdominal aorta: No aneurysm visualized.  Other findings: None.   IMPRESSION: 1. Contracted gallbladder with mild gallbladder wall thickening, but no cholelithiasis or sonographic Murphy sign elicited to strongly suggest acute cholecystitis. 2. Liver metastatic disease poorly visible by ultrasound. 3. Suggestion of chronic medical renal disease.   Electronically Signed   By: Genevie Ann M.D.   On: 03/03/2015 15:56   Dg Chest Port 1 View  03/03/2015   CLINICAL DATA:  Right-sided pain  EXAM: PORTABLE CHEST - 1 VIEW  COMPARISON:  02/06/2015  FINDINGS: Cardiac shadow is stable. The left lung remains well aerated. Volume loss on the right with mediastinal shift to the right is noted. Old rib fractures are again seen on the right. A right-sided pleural effusion is again noted and stable.  IMPRESSION: No change from the prior exam.  No acute abnormality noted.   Electronically Signed   By: Inez Catalina M.D.   On: 03/03/2015 11:58   US Abdomen Limited Ruq  03/10/2015   CLINICAL DATA:  Initial evaluation for jaundice  EXAM: US ABDOMEN LIMITED - RIGHT UPPER QUADRANT  COMPARISON:  03/03/2015 CT scan  FINDINGS: Gallbladder:  Gallbladder wall is 5 mm in thickness. The gallbladder appears to be contracted. The patient however denies any p.o. intake for 12 hours. No Murphy's sign. No calculi identified.  Common bile duct:  Diameter: 4.8 mm  Liver:  Diffusely coarsened echotexture with no focal abnormalities.  IMPRESSION: 1. Diffusely abnormal hepatic parenchyma which could be due to fatty infiltration. The finding is nonspecific however and diffuse hepatic parenchymal disease of other causes not excluded. 2. Gallbladder wall thickening within apparently contracted gallbladder. The patient indicates NPO status however. Hepatic parenchymal disease can also cause gallbladder wall thickening.   Electronically Signed   By: Skipper Cliche M.D.   On: 03/10/2015 11:27    Oren Binet, MD  [facility] Pager:[phone]  If 7PM-7AM, please contact  night-coverage [email or web address] Password [email or web address] 03/10/2015, 1:27 PM   LOS: 1 day

## 2015-03-10 NOTE — Progress Notes (Signed)
Patient did not sleep well last night despite Restoril 30 mg. Complaint of nausea, zofran IV given. Patient felt very constipated with last bowel movement on 3/30. Dulcolax suppository given with no relief. Patient was restless moving from bed to lounge chair and cannot seem to get comfortable from pain to his abdomen. Scheduled Fentanyl patch applied. Refused tylenol or vicodin, per patient it makes him itch. Hot packs offered with minimal relief.

## 2015-03-10 NOTE — Progress Notes (Signed)
Patient refused lab work this morning, he claimed he was stuck 4x last night. He wants to just sleep for now and have lab come back later.

## 2015-03-11 ENCOUNTER — Other Ambulatory Visit: Payer: 59

## 2015-03-11 ENCOUNTER — Other Ambulatory Visit: Payer: Self-pay | Admitting: *Deleted

## 2015-03-11 ENCOUNTER — Encounter (INDEPENDENT_AMBULATORY_CARE_PROVIDER_SITE_OTHER): Payer: 59 | Admitting: Ophthalmology

## 2015-03-11 LAB — HEMOGLOBIN A1C
HEMOGLOBIN A1C: 6.1 % — AB (ref 4.8–5.6)
MEAN PLASMA GLUCOSE: 128 mg/dL

## 2015-03-18 ENCOUNTER — Other Ambulatory Visit: Payer: 59

## 2015-03-19 NOTE — Discharge Summary (Signed)
Death Summary  Todd Villanueva UDJ:[id number] DOB: 1941/05/13 DOA: 03/21/2015  PCP: [name], MD PCP/Office notified: electronically routed  [place] date: Mar 21, 2015 Date of Death: March 23, 2015  Final Diagnoses:  Principal Problem:   Acute Renal Failure Active Problems:   Hyperkalemia'   Metabolic Acidosis   Jaundice   Elevated Liver Enzymes   Cancer of lower lobe of right lung   Essential hypertension    Liver metastases   History of present illness:  Todd Villanueva was a 74 y.o. 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 He was seen by his PCP on the day of admission and referred to the ED. He was subsequently found to have worsening jaundice, elevated liver enzymes, Acute renal failure and admitted for further evaluation and treatment.  Hospital Course:  RUQ Abdominal Pain: Likely secondary to liver metastases. Very poor prognosis, case was discussed with both primary oncologist over the phone (Dr. Julien Nordmann), patient and spouse at bedside-all agreeable to initiate palliative measures. Patient and spouse were agreeable for DO NOT RESUSCITATE status. Palliative care was consulted, plans were to follow labs and start aggressive pain control with narcotics. Unfortunately patient deteriorated rapidly and passed away on 03/22/2016 at 0340 hours.  Active Problems:  Jaundice with worsening LFTs: Most recent CT scan abdomen on 3/29 did show hepatic metastases-but a poor study given a noncontrast exam, was seen by gastroenterology during his last admission-worsening LFTs was thought to be secondary to metastatic disease. Hepatitis panel was negative. Repeat right upper quadrant ultrasound on 4/5 shows no major gallbladder pathology, common bile duct was not dilated. Suspicion was from metastatic disease, irrespective of etiology-prognosis was very poor given his frailty,  worsening renal function.Plans were to monitor for another day or so, if kept on worsening, then to initiate full comfort measures. Patient and spouse were aware of grim prognosis.  Unfortunately patient deteriorated rapidly and passed away on 03/22/16 at 0340 hours.   Acute on chronic renal disease stage III: Multifactorial etiology, but predominantly secondary to prerenal azotemia from acute illness, diuretic and lisinopril use. Lasix was held and discontinued lisinopril discontinued . Managed with IV fluids and supportive care. Patient and family aware, that patient would best benefit from initiation of hospice/comfort measures. Given rapid deterioration, advanced malignancy-patient was not candidate for dialysis-both patient and spouse agreed.Unfortunately patient deteriorated rapidly and passed away on 03/22/16 at 0340 hours.   Hyperkalemia: Secondary to ACE inhibitor and worsening renal function. Treated with Kayexalate, IV insulin with D50. . As noted above, ACE inhibitor was been discontinued.Unfortunately this was persistent, patient was started on bicarb drip. See above, was not a dialysis candidate.Unfortunately patient deteriorated rapidly and passed away on 22-Mar-2016 at 0340 hours.   Thrombocytopenia: Suspect from liver disease and possible underlying malignancy with advanced metastases. Managed with Supportive care for now. Case discussed with Dr. Julien Nordmann who recommended platelet transfusion. Since patient and family are now focusing more on comfort, plans were to hold platelet transfusion till evidence of bleeding is evident.   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 Julien Nordmann, given above noted issues, he was agreeable with a palliative care consult. Family and patient were aware of very poor prognosis. If there was no improvement in hepatic and renal function in a few days, plans were to initiate full comfort  measures    Signed:  Oren Binet  [facility] 03/19/2015, 11:07 AM

## 2015-03-24 ENCOUNTER — Ambulatory Visit (HOSPITAL_COMMUNITY): Payer: 59

## 2015-03-26 ENCOUNTER — Other Ambulatory Visit: Payer: 59

## 2015-03-26 ENCOUNTER — Ambulatory Visit: Payer: 59 | Admitting: Internal Medicine

## 2015-03-31 ENCOUNTER — Ambulatory Visit: Payer: 59 | Admitting: Cardiology

## 2015-04-05 NOTE — Progress Notes (Signed)
RN paged NP secondary to pt expiring at 0340. RN found pt pulseless and without respirations. Pt was a DNR/DNI. Pronounced by 2 RNs. Death certificate completed and given to RN. Wife called who will come to hospital. Pt had stage IV metastatic lung cancer to liver and bone. Death was expected. Clance Boll, NP [facility]

## 2015-04-05 NOTE — Progress Notes (Signed)
Pt is very uncomfortable. Refused his evening meds and only asked for pain medication. IV pain meds given per order. Made pt comfortable back to bed from chair. We will continue to monitor.

## 2015-04-05 NOTE — Progress Notes (Signed)
Pt expired at 3:40am. 2RN pronounced death of pt. [employer]. [place],NP made aware. Called pt wife Lyman Bishop and made aware. Body cleaned for viewing. IV discontinued.

## 2015-04-05 NOTE — Progress Notes (Signed)
Chaplain responded to page from RN that pt had passed.  Chaplain provided emotional and spiritual support to pt's spouse at bedside.  Pt is an Glass blower/designer at [employer], worked in [employer].  Spouse grieving appropriately, says they thought they had 3 weeks to prepare for this.  Chaplain offered presence and empathetic listening and escorted pt's spouse as she departed unit.      03/12/15 0600  Clinical Encounter Type  Visited With Family;Patient not available  Visit Type Initial;Spiritual support;Social support;Death  Referral From [facility]  Spiritual Encounters  Spiritual Needs Emotional;Grief support  Stress Factors  Family Stress Factors Exhausted;Loss;Major life changes   Geralyn Flash 03/12/15 6:54 AM

## 2015-04-05 DEATH — deceased

## 2015-10-03 IMAGING — CR DG CHEST 1V PORT
1 series · 1 of 1 positions shown · non-contrast
Comparison: 02/06/2015

CLINICAL DATA: Right-sided pain

EXAM:
PORTABLE CHEST - 1 VIEW

[AP]
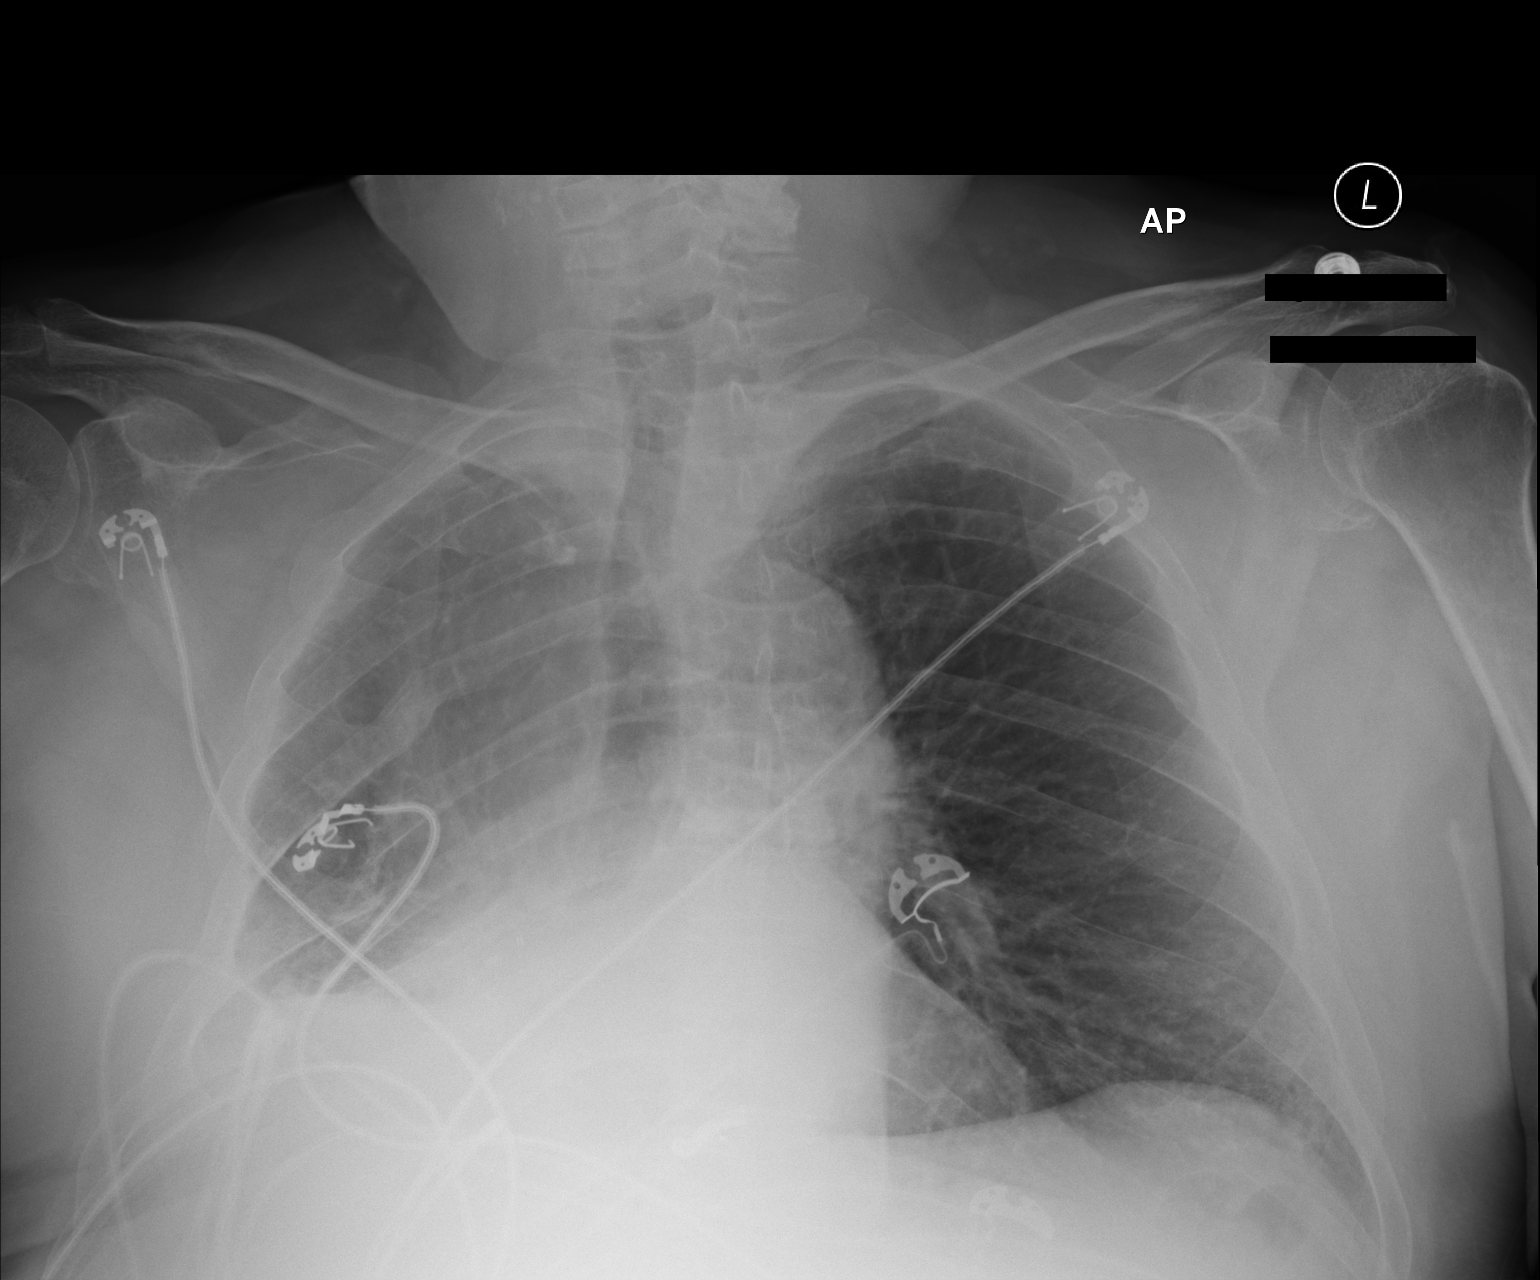

[1 of 1 positions shown; findings below may reference images not displayed]

FINDINGS: Cardiac shadow is stable. The left lung remains well aerated. Volume
loss on the right with mediastinal shift to the right is noted. Old
rib fractures are again seen on the right. A right-sided pleural
effusion is again noted and stable.
IMPRESSION: No change from the prior exam.  No acute abnormality noted.

## 2015-10-03 IMAGING — CT CT ABD-PELV W/O CM
2 of 4 series · 16 of 46 positions shown, 18 images · non-contrast
Comparison: CT 10/15/14

CLINICAL DATA: Right lower quadrant abdominal pain . 100 therapy
for right lung cancer. Renal insufficiency. No IV contrast.

EXAM:
CT ABDOMEN AND PELVIS WITHOUT CONTRAST
TECHNIQUE: Multidetector CT imaging of the abdomen and pelvis was performed
following the standard protocol without IV contrast.

[Series 2: abd/ pelvis 5.0 i30f 1 · axial · 0.93mm/px · z∈[+874,+1294]mm · 13 of 92 slices shown, 15 images]
[im 4/92  soft-tissue]
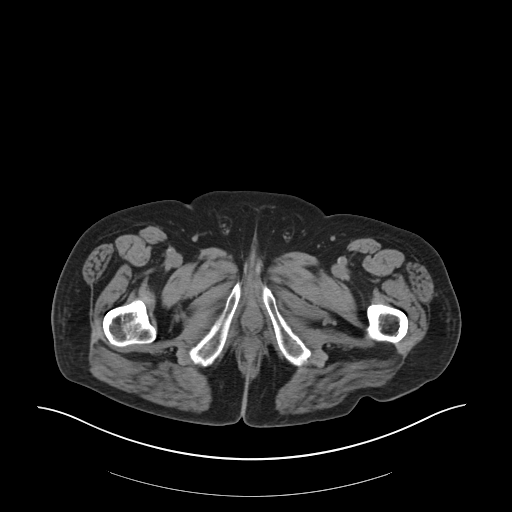
[im 4/92  bone]
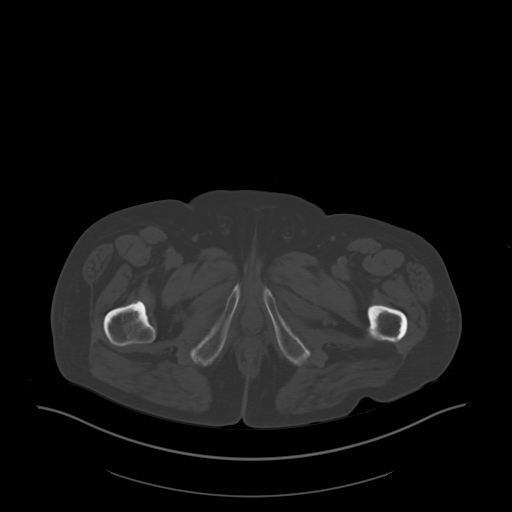
[im 11/92  soft-tissue]
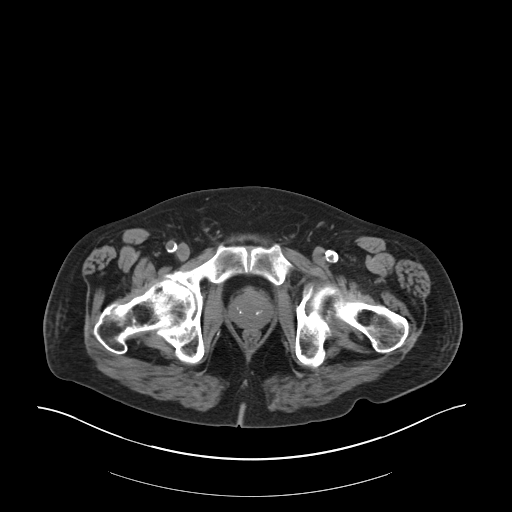
[im 19/92  soft-tissue]
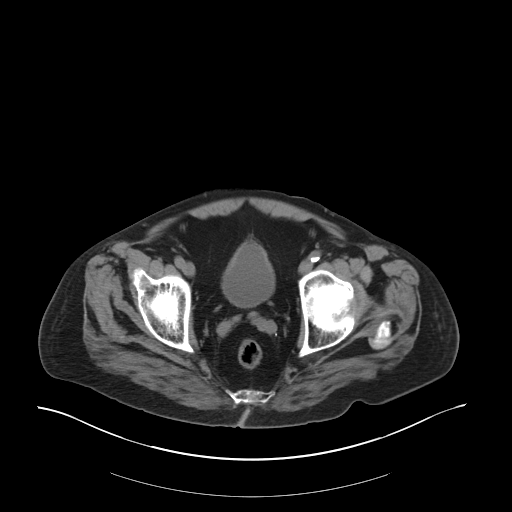
[im 26/92  soft-tissue]
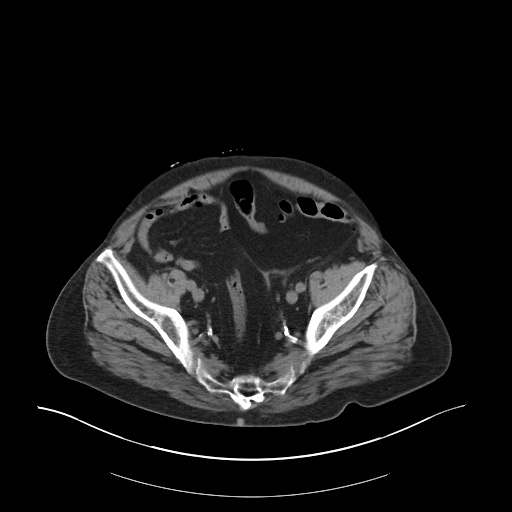
[im 33/92  soft-tissue]
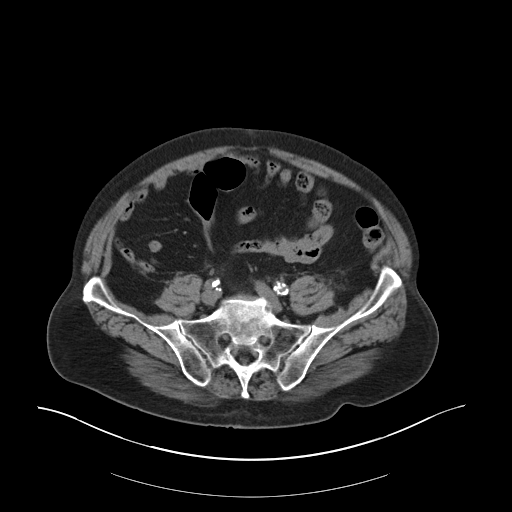
[im 41/92  soft-tissue]
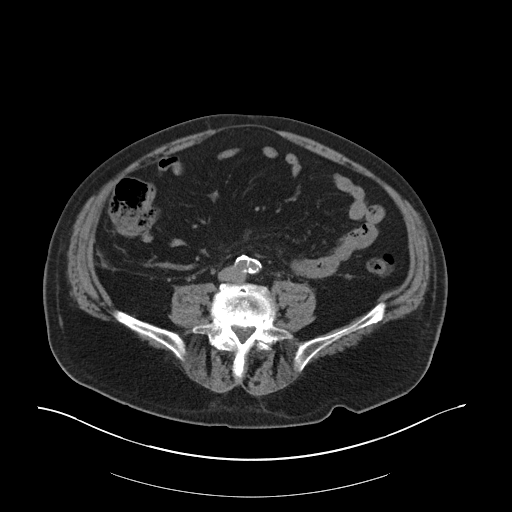
[im 48/92  soft-tissue]
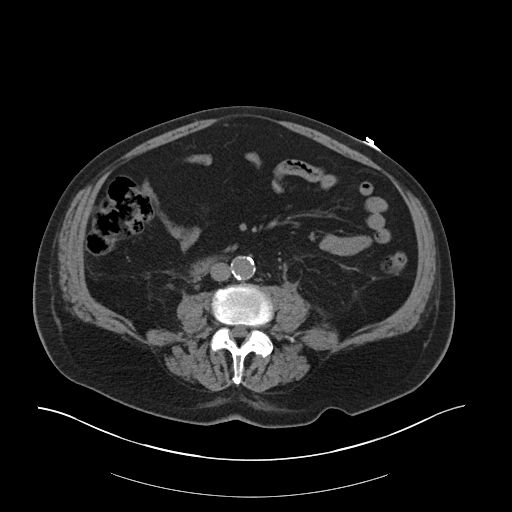
[im 51/92  soft-tissue]
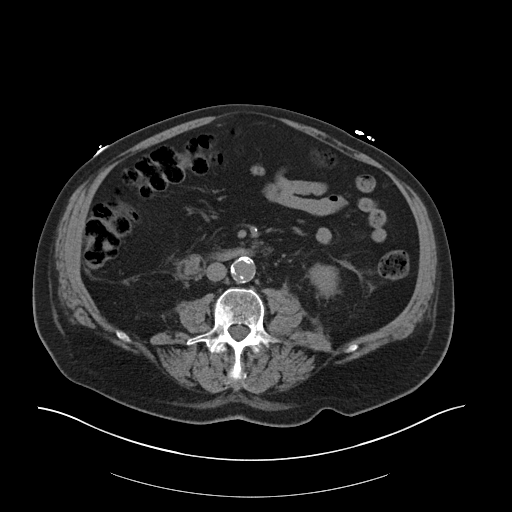
[im 59/92  soft-tissue]
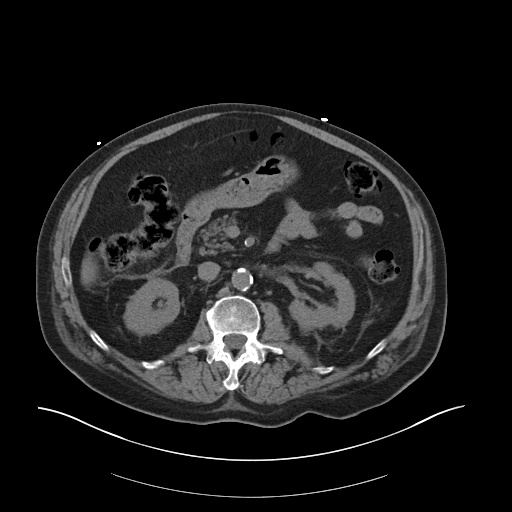
[im 59/92  bone]
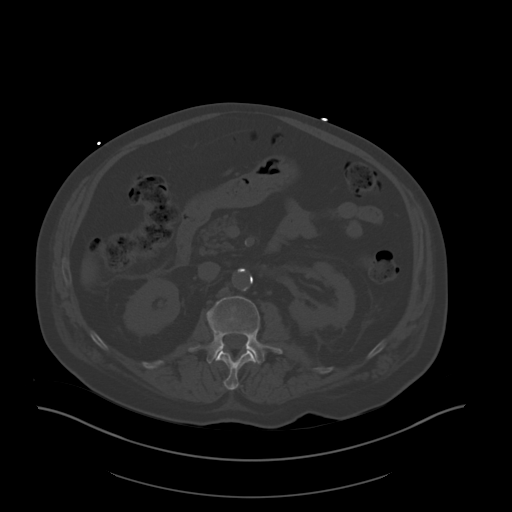
[im 66/92  soft-tissue]
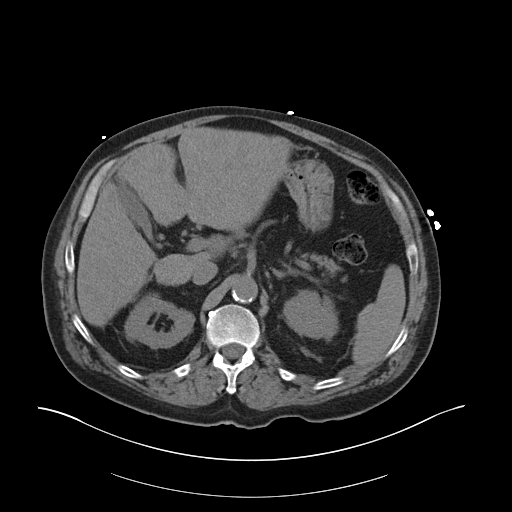
[im 73/92  soft-tissue]
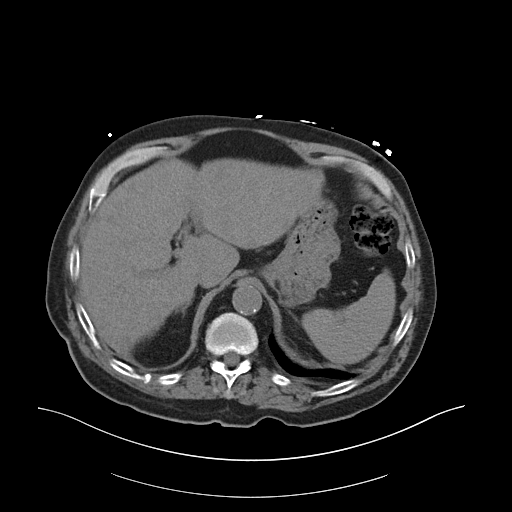
[im 81/92  soft-tissue]
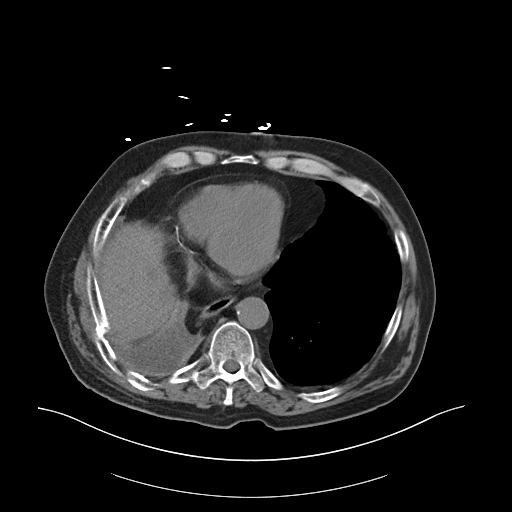
[im 88/92  soft-tissue]
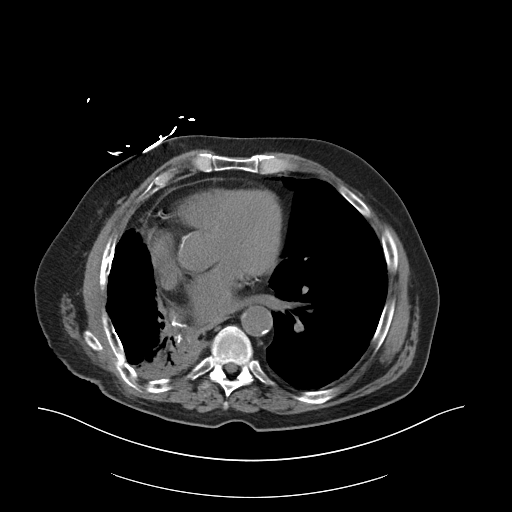

[Series 5: coronals · coronal · 0.84mm/px · 3 of 181 slices shown]
[im 61/181  soft-tissue]
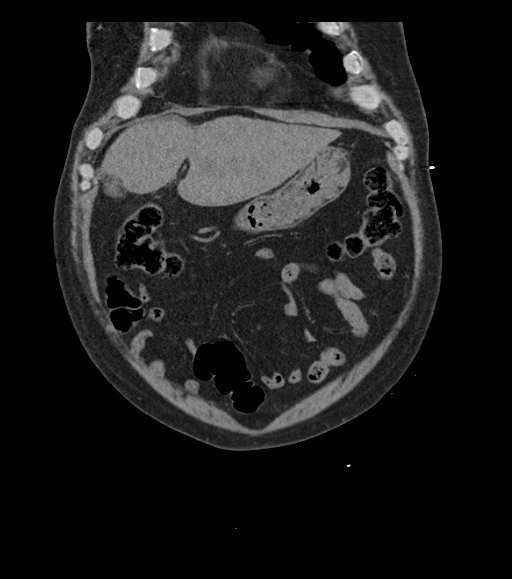
[im 81/181  soft-tissue]
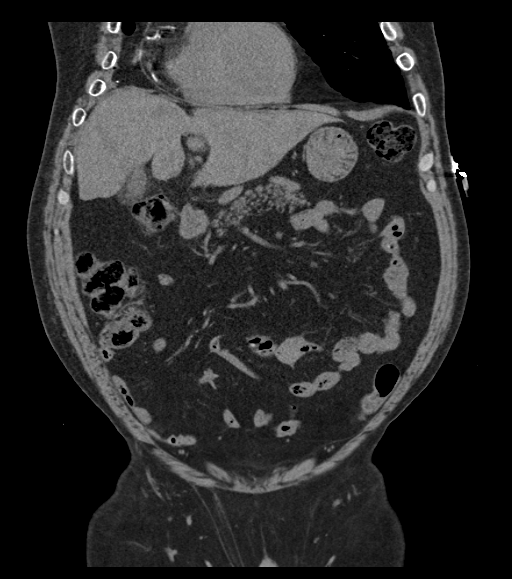
[im 101/181  soft-tissue]
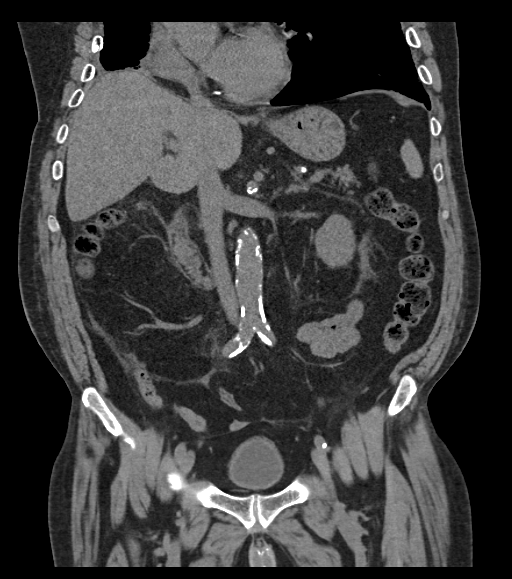

[16 of 46 positions shown; findings below may reference images not displayed]

FINDINGS: Lower chest: Postsurgical change the right hemi thorax volume loss.
Consolidation in the medial right lower lobe adjacent to surgical
clips consistent with posttherapy collapse / consolidation. No
change from prior. There is a loculated fluid collection in the
inferior posterior right hemi thorax measuring 5.4 by 3.8 cm
compared to 5.8 x 3.7 cm. There is a mild reticular pattern in the
peripheral left lower lobe. No nodularity

Hepatobiliary: Several hypodense lesions within liver which cannot
further characterized on this noncontrast exam. There were previous
hypermetabolic liver metastasis on PET-CT scan of 09/30/2014. In
comparison to this region on noncontrast CT several lesions are
smaller.

Pancreas: Pancreas is normal. No ductal dilatation. No pancreatic
inflammation.

Spleen: Normal normal spleen.

Adrenals/urinary tract: Normal adrenal glands are normal. The
kidneys ureters bladder.

Stomach/Bowel: Stomach, small bowel, and colon are normal.

Vascular/Lymphatic: Abdominal aorta is normal caliber with
atherosclerotic calcification. There is no retroperitoneal or
periportal lymphadenopathy. No pelvic lymphadenopathy.

Reproductive: Prostate gland is normal. Small inguinal hernias
noted.

Musculoskeletal: No aggressive osseous lesion. Thoracotomy defect in
the posterior right hemi thorax ribs. Sclerotic change in the right
humeral head is felt to be degenerative.
IMPRESSION: 1. No acute findings abdomen pelvis.
2. Hepatic metastasis difficult to assess on this noncontrast exam.
3. No nephrolithiasis or ureterolithiasis.
4. Normal appendix.
5. Postsurgical change in the right hemi thorax with consolidation
and loculated fluid collection. These findings are not significantly
changed 10/15/2014.
6. Stable sclerotic change in the right femoral head. Cannot exclude
avascular necrosis.

## 2015-10-28 ENCOUNTER — Other Ambulatory Visit: Payer: 59

## 2015-10-28 ENCOUNTER — Ambulatory Visit: Payer: 59

## 2015-10-28 ENCOUNTER — Ambulatory Visit: Payer: 59 | Admitting: Internal Medicine

## 2016-05-23 ENCOUNTER — Other Ambulatory Visit: Payer: Self-pay | Admitting: Nurse Practitioner
